# Patient Record
Sex: Female | Born: 1965 | Race: White | Hispanic: No | Marital: Single | Smoking: Never smoker
Health system: Southern US, Community
[De-identification: ages and names within clinical notes are randomized; demographics above are authoritative.]

## PROBLEM LIST (undated history)

## (undated) DIAGNOSIS — Z808 Family history of malignant neoplasm of other organs or systems: Secondary | ICD-10-CM

## (undated) DIAGNOSIS — K219 Gastro-esophageal reflux disease without esophagitis: Secondary | ICD-10-CM

## (undated) DIAGNOSIS — I10 Essential (primary) hypertension: Secondary | ICD-10-CM

## (undated) DIAGNOSIS — R0600 Dyspnea, unspecified: Secondary | ICD-10-CM

## (undated) DIAGNOSIS — Z803 Family history of malignant neoplasm of breast: Secondary | ICD-10-CM

## (undated) DIAGNOSIS — G4730 Sleep apnea, unspecified: Secondary | ICD-10-CM

## (undated) DIAGNOSIS — R519 Headache, unspecified: Secondary | ICD-10-CM

## (undated) DIAGNOSIS — C801 Malignant (primary) neoplasm, unspecified: Secondary | ICD-10-CM

## (undated) DIAGNOSIS — S82852A Displaced trimalleolar fracture of left lower leg, initial encounter for closed fracture: Secondary | ICD-10-CM

## (undated) DIAGNOSIS — F419 Anxiety disorder, unspecified: Secondary | ICD-10-CM

## (undated) DIAGNOSIS — Z800 Family history of malignant neoplasm of digestive organs: Secondary | ICD-10-CM

## (undated) DIAGNOSIS — Z923 Personal history of irradiation: Secondary | ICD-10-CM

## (undated) HISTORY — DX: Family history of malignant neoplasm of digestive organs: Z80.0

## (undated) HISTORY — PX: BREAST BIOPSY: SHX20

## (undated) HISTORY — DX: Essential (primary) hypertension: I10

## (undated) HISTORY — DX: Family history of malignant neoplasm of other organs or systems: Z80.8

## (undated) HISTORY — DX: Family history of malignant neoplasm of breast: Z80.3

## (undated) NOTE — *Deleted (*Deleted)
[facility]  [phone] [phone]     ID: Sherri Schultz DOB: July 29, 1966  MR#: [id number]  ONG#:[id number]  Patient Care Team: Elizabeth Palau, FNP as PCP - General (Nurse Practitioner) Donnelly Angelica, RN as Oncology Nurse Navigator Pershing Proud, RN as Oncology Nurse Navigator Almond Lint, MD as Consulting Physician (General Surgery) Magrinat, Valentino Hue, MD as Consulting Physician (Oncology) Dorothy Puffer, MD as Consulting Physician (Radiation Oncology) Tarry Kos, MD as Attending Physician (Orthopedic Surgery) Kari Baars OTHER MD:  I connected with Sherri Schultz on 09/10/20 at  2:30 PM EDT by {Blank single:19197::"video enabled telemedicine visit","telephone visit"} and verified that I am speaking with the correct person using two identifiers.   I discussed the limitations, risks, security and privacy concerns of performing an evaluation and management service by telemedicine and the availability of in-person appointments. I also discussed with the patient that there may be a patient responsible charge related to this service. The patient expressed understanding and agreed to proceed.   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  She completed radiation therapy on 05/04/2020 under Dr. Mitzi Hansen.  She started tamoxifen on 05/29/2020.  Since her last visit, she reported left breast tenderness. She underwent left diagnostic mammography and left breast ultrasonography on 09/07/2020 showing: breast density category B; no evidence of sonographic abnormalities to account for tenderness in left breast; no evidence of malignancy.    REVIEW OF SYSTEMS: Sherri Schultz    HISTORY OF CURRENT ILLNESS: From the original intake note:  Sherri Schultz had routine screening mammography on 12/01/2019 showing a possible abnormalities in the left breast. She underwent left diagnostic mammography with tomography and left breast ultrasonography at [facility] on 12/08/2019 showing: breast density category C; suspicious 1.5 cm mass involving the lower-inner quadrant of the left breast at 8:30, with a hyperechoic halo surrounding the mass for a total span of 1.8 cm; indeterminate 0.4 cm mass in the upper-inner quadrant of the left breast at 10 o'clock; no pathologic left axillary lymphadenopathy; benign cyst in the upper-outer subareolar left breast.  Accordingly on 12/14/2019 she proceeded to biopsy of the left breast areas in question. The pathology from this procedure ([id number]) showed:  1. Left breast, 8:30   - ductal carcinoma in situ, grade 3, with apocrine features (0.6 cm on biopsy)  - estrogen receptor, 0% negative and progesterone receptor, 0% negative.  2. Left breast, 10 o'clock  - fibroadenomatoid nodule with columnar cells changes  The patient's subsequent history is as detailed below.   PAST MEDICAL HISTORY: Past Medical History:  Diagnosis Date  . Anxiety   . 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 Cancer [facility]) 2013   right knee  . Family history of brain cancer   . Family history of breast cancer   . Family history of colon cancer   . Family history of melanoma   . Hypertension   . Trimalleolar fracture of ankle, closed, left, initial encounter     PAST SURGICAL HISTORY: Past Surgical History:  Procedure Laterality Date  . BREAST BIOPSY Right 10+ yrs ago   benign  . BREAST LUMPECTOMY WITH RADIOACTIVE SEED LOCALIZATION Left 02/15/2020  Procedure: LEFT BREAST LUMPECTOMY WITH RADIOACTIVE SEED LOCALIZATION;  Surgeon: Almond Lint, MD;  Location: [facility];  Service: General;  Laterality:  Left;  Marland Kitchen MELANOMA EXCISION Right 2013   knee  . ORIF ANKLE FRACTURE Left 05/06/2018   Procedure: OPEN REDUCTION INTERNAL FIXATION (ORIF) LEFT TRIMALLEOLAR ANKLE FRACTURE;  Surgeon: Tarry Kos, MD;  Location: [facility];  Service: Orthopedics;  Laterality: Left;  . RADIOACTIVE SEED GUIDED EXCISIONAL BREAST BIOPSY Right 02/15/2020   Procedure: RIGHT BREAST RADIOACTIVE SEED LOCALIZATION EXCISIONAL BIOPSY X 2;  Surgeon: Almond Lint, MD;  Location: [facility];  Service: General;  Laterality: Right;    FAMILY HISTORY: Family History  Problem Relation Age of Onset  . Breast cancer Mother 5  . Diabetes Mother   . Colon cancer Mother 60  . Breast cancer Maternal Aunt        dx. >50  . Diabetes Father   . Hypertension Father   . Heart attack Father   . Arthritis Father   . Diabetes Brother   . Melanoma Brother 93  . Diabetes Brother   . Breast cancer Cousin        dx. 40s/50s  . Brain cancer Maternal Uncle        dx. 20s  . Breast cancer Maternal Aunt        dx. >50  . Breast cancer Maternal Aunt        dx. >50  . Cancer Maternal Uncle        unknown type, dx. >50  . Breast cancer Cousin        dx. 40s/50s   The patient's father died at age 57 from congestive heart failure.  Patient's mother died from breast cancer at age 19, diagnosed age 71.. The patient denies a family hx of ovarian cancer. She reports breast cancer in 2 maternal aunts and 2 maternal first cousins.  The patient has 2 brothers with no history of cancer.   GYNECOLOGIC HISTORY:  Patient's last menstrual period was 03/26/2016. Menarche: 32 years old GX P 0 LMP 2017 HRT no  Hysterectomy? no BSO? 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  They mostly have trees in their [place] acres.  Her husband Sherri Schultz (the patient kept her name on her insurance) used to work as a Copywriter, advertising for [employer] but is now  also retired.  They have no children and no pets.  The patient attends a local [employer]   ADVANCED DIRECTIVES: In the absence of any documents to the contrary the patient's husband is her healthcare power of attorney   HEALTH MAINTENANCE: Social History   Tobacco Use  . Smoking status: Never Smoker  . Smokeless tobacco: Never Used  Substance Use Topics  . Alcohol use: Never  . Drug use: Never     Colonoscopy: Never  PAP: 04/2016, negative  Bone density: Never   No Known Allergies  Current Outpatient Medications  Medication Sig Dispense Refill  . calcium-vitamin D (OSCAL WITH D) 500-200 MG-UNIT tablet Take 1 tablet by mouth 3 (three) times daily. 90 tablet 12  . estradiol (ESTRACE VAGINAL) 0.1 MG/GM vaginal cream Place 1 Applicatorful vaginally at bedtime. 42.5 g 12  . meclizine (ANTIVERT) 25 MG tablet Take 1 tablet by mouth 3 (three) times daily as needed.    . propranolol (INDERAL) 40 MG tablet Take 40 mg by  mouth daily.    . valACYclovir (VALTREX) 1000 MG tablet Take 2 tablets by mouth 2 (two) times daily as needed.    . venlafaxine XR (EFFEXOR-XR) 150 MG 24 hr capsule Take 1 capsule by mouth daily.  0  . zinc sulfate 220 (50 Zn) MG capsule Take 1 capsule (220 mg total) by mouth daily. 42 capsule 0   No current facility-administered medications for this visit.    OBJECTIVE:   There were no vitals filed for this visit.   There is no height or weight on file to calculate BMI.   Wt Readings from Last 3 Encounters:  03/22/20 163 lb 14.4 oz (74.3 kg)  02/15/20 162 lb 11.2 oz (73.8 kg)  12/22/19 159 lb 14.4 oz (72.5 kg)      ECOG FS:  Telemedicine visit 09/11/2020   LAB RESULTS:  CMP     Component Value Date/Time   NA 142 12/22/2019 0827   K 3.7 12/22/2019 0827   CL 110 12/22/2019 0827   CO2 24 12/22/2019 0827   GLUCOSE 101 (H) 12/22/2019 0827   BUN 7 12/22/2019 0827   CREATININE 0.90 12/22/2019 0827   CALCIUM 8.7 (L) 12/22/2019 0827   PROT 7.1  12/22/2019 0827   ALBUMIN 3.9 12/22/2019 0827   AST 18 12/22/2019 0827   ALT 16 12/22/2019 0827   ALKPHOS 100 12/22/2019 0827   BILITOT 0.3 12/22/2019 0827   GFRNONAA >60 12/22/2019 0827   GFRAA >60 12/22/2019 0827    No results found for: TOTALPROTELP, ALBUMINELP, A1GS, A2GS, BETS, BETA2SER, GAMS, MSPIKE, SPEI  Lab Results  Component Value Date   WBC 4.5 12/22/2019   NEUTROABS 2.1 12/22/2019   HGB 12.4 12/22/2019   HCT 37.6 12/22/2019   MCV 93.1 12/22/2019   PLT 298 12/22/2019    No results found for: LABCA2  No components found for: WUJWJX914  No results for input(s): INR in the last 168 hours.  No results found for: LABCA2  No results found for: NWG956  No results found for: OZH086  No results found for: VHQ469  No results found for: CA2729  No components found for: HGQUANT  No results found for: CEA1 / No results found for: CEA1   No results found for: AFPTUMOR  No results found for: CHROMOGRNA  No results found for: KPAFRELGTCHN, LAMBDASER, KAPLAMBRATIO (kappa/lambda light chains)  No results found for: HGBA, HGBA2QUANT, HGBFQUANT, HGBSQUAN (Hemoglobinopathy evaluation)   No results found for: LDH  No results found for: IRON, TIBC, IRONPCTSAT (Iron and TIBC)  No results found for: FERRITIN  Urinalysis No results found for: COLORURINE, APPEARANCEUR, LABSPEC, PHURINE, GLUCOSEU, HGBUR, BILIRUBINUR, KETONESUR, PROTEINUR, UROBILINOGEN, NITRITE, LEUKOCYTESUR   STUDIES: US BREAST LTD UNI LEFT INC AXILLA  Result Date: 09/07/2020 CLINICAL DATA:  35 year old female with left breast DCIS post lumpectomy 02/15/2020 followed by radiation therapy. Patient reports tenderness involving the upper-outer quadrant of the left breast. EXAM: DIGITAL DIAGNOSTIC UNILATERAL LEFT MAMMOGRAM WITH TOMO AND CAD; ULTRASOUND LEFT BREAST LIMITED COMPARISON:  Previous exams. ACR Breast Density Category b: There are scattered areas of fibroglandular density. FINDINGS: No  suspicious masses or calcifications are seen in the left breast. New lumpectomy changes are present in the lower inner left breast. There is no mammographic evidence of malignancy in the left breast. Mammographic images were processed with CAD. Physical examination of the upper-outer left breast reveals tenderness with deep palpation at the approximate 2 to 4 o'clock position. Targeted ultrasound of the left breast was performed. No suspicious masses  or abnormality seen, only heterogeneous fibroglandular tissue identified. IMPRESSION: 1. No mammographic or sonographic abnormalities to account for tenderness involving the upper-outer quadrant of the left breast. 2. New lumpectomy changes in the left breast. No mammographic evidence of malignancy. RECOMMENDATION: Bilateral diagnostic mammography January 2022. I have discussed the findings and recommendations with the patient. If applicable, a reminder letter will be sent to the patient regarding the next appointment. BI-RADS CATEGORY  2: Benign. Electronically Signed   By: Edwin Cap M.D.   On: 09/07/2020 16:33   MM DIAG BREAST TOMO UNI LEFT  Result Date: 09/07/2020 CLINICAL DATA:  27 year old female with left breast DCIS post lumpectomy 02/15/2020 followed by radiation therapy. Patient reports tenderness involving the upper-outer quadrant of the left breast. EXAM: DIGITAL DIAGNOSTIC UNILATERAL LEFT MAMMOGRAM WITH TOMO AND CAD; ULTRASOUND LEFT BREAST LIMITED COMPARISON:  Previous exams. ACR Breast Density Category b: There are scattered areas of fibroglandular density. FINDINGS: No suspicious masses or calcifications are seen in the left breast. New lumpectomy changes are present in the lower inner left breast. There is no mammographic evidence of malignancy in the left breast. Mammographic images were processed with CAD. Physical examination of the upper-outer left breast reveals tenderness with deep palpation at the approximate 2 to 4 o'clock position.  Targeted ultrasound of the left breast was performed. No suspicious masses or abnormality seen, only heterogeneous fibroglandular tissue identified. IMPRESSION: 1. No mammographic or sonographic abnormalities to account for tenderness involving the upper-outer quadrant of the left breast. 2. New lumpectomy changes in the left breast. No mammographic evidence of malignancy. RECOMMENDATION: Bilateral diagnostic mammography January 2022. I have discussed the findings and recommendations with the patient. If applicable, a reminder letter will be sent to the patient regarding the next appointment. BI-RADS CATEGORY  2: Benign. Electronically Signed   By: Edwin Cap M.D.   On: 09/07/2020 16:33     ELIGIBLE FOR AVAILABLE RESEARCH PROTOCOL: No  ASSESSMENT: 47 y.o. Sherri Schultz woman status post left breast biopsy 12/14/2019 for ductal carcinoma in situ, clinically 1.8 cm, grade 3, estrogen and progesterone receptor negative  (1) genetics testing 01/01/2020 through the STAT Breast cancer panel offered by [facility] found no deleterious mutations in  ATM, BRCA1, BRCA2, CDH1, CHEK2, PALB2, PTEN, STK11 and TP53.  The Multi-Cancer Panel offered by [facility] includes also found no deleterious mutations inAIP, ALK, APC, ATM, AXIN2,BAP1,  BARD1, BLM, BMPR1A, BRCA1, BRCA2, BRIP1, CASR, CDC73, CDH1, CDK4, CDKN1B, CDKN1C, CDKN2A (p14ARF), CDKN2A (p16INK4a), CEBPA, CHEK2, CTNNA1, DICER1, DIS3L2, EGFR (c.2369C>T, p.Thr790Met variant only), EPCAM (Deletion/duplication testing only), FH, FLCN, GATA2, GPC3, GREM1 (Promoter region deletion/duplication testing only), HOXB13 (c.251G>A, p.Gly84Glu), HRAS, KIT, MAX, MEN1, MET, MITF (c.952G>A, p.Glu318Lys variant only), MLH1, MSH2, MSH3, MSH6, MUTYH, NBN, NF1, NF2, NTHL1, PALB2, PDGFRA, PHOX2B, PMS2, POLD1, POLE, POT1, PRKAR1A, PTCH1, PTEN, RAD50, RAD51C, RAD51D, RB1, RECQL4, RET, RNF43, RUNX1, SDHAF2, SDHA (sequence changes only), SDHB, SDHC, SDHD, SMAD4, SMARCA4, SMARCB1, SMARCE1,  STK11, SUFU, TERC, TERT, TMEM127, TP53, TSC1, TSC2, VHL, WRN and WT1.     (a) a variant of  uncertain significance was detected in the MSH3 gene called c.230_232dup (p.Pro77dup).   (2) status post bilateral lumpectomies 02/15/2020, showing  (a) on the right, no evidence of malignancy (radial scar, fibroadenoma).  (b) on the left, ductal carcinoma in situ, grade 3, measuring 1.4 cm, with negative margins  (3) adjuvant radiation 03/20/2020 - 05/04/2020  (a) left breast / 50.4 Gy in 28 fractions  (b) seroma boost / 10 Gy in 5 fractions  (4) started  tamoxifen 05/29/2020   PLAN: Televisit 08/11/2020 has been rescheduled for 09/11/2020   Kari Baars   09/10/2020 12:21 PM Medical Oncology and Hematology [facility] [address] [place], [place] [address] Tel. [phone]    Fax. [phone]   This document serves as a record of services personally performed by Ruthann Cancer, MD. It was created on his behalf by Mickie Bail, a trained medical scribe. The creation of this record is based on the scribe's personal observations and the provider's statements to them.   I, Ruthann Cancer MD, have reviewed the above documentation for accuracy and completeness, and I agree with the above.   *Total Encounter Time as defined by the Centers for Medicare and Medicaid Services includes, in addition to the face-to-face time of a patient visit (documented in the note above) non-face-to-face time: obtaining and reviewing outside history, ordering and reviewing medications, tests or procedures, care coordination (communications with other health care professionals or caregivers) and documentation in the medical record.

---

## 2001-12-16 ENCOUNTER — Encounter: Admission: RE | Admit: 2001-12-16 | Discharge: 2001-12-16 | Payer: Self-pay | Admitting: Family Medicine

## 2001-12-16 ENCOUNTER — Encounter: Payer: Self-pay | Admitting: Family Medicine

## 2001-12-21 ENCOUNTER — Encounter: Admission: RE | Admit: 2001-12-21 | Discharge: 2001-12-21 | Payer: Self-pay | Admitting: Family Medicine

## 2001-12-21 ENCOUNTER — Encounter: Payer: Self-pay | Admitting: Family Medicine

## 2002-12-23 ENCOUNTER — Encounter: Admission: RE | Admit: 2002-12-23 | Discharge: 2002-12-23 | Payer: Self-pay | Admitting: Family Medicine

## 2002-12-23 ENCOUNTER — Encounter: Payer: Self-pay | Admitting: Family Medicine

## 2004-01-11 ENCOUNTER — Encounter: Admission: RE | Admit: 2004-01-11 | Discharge: 2004-01-11 | Payer: Self-pay | Admitting: Family Medicine

## 2006-07-25 ENCOUNTER — Encounter: Admission: RE | Admit: 2006-07-25 | Discharge: 2006-07-25 | Payer: Self-pay | Admitting: Family Medicine

## 2006-07-25 IMAGING — MG MM SCREEN MAMMOGRAM BILATERAL
4 series · 4 of 4 positions shown · non-contrast
Comparison: none

DG SCREEN MAMMOGRAM BILATERAL
Bilateral CC and MLO view(s) were taken.
Prior study comparison: [DATE], bilateral screening mammogram.

SCREENING MAMMOGRAM:
The breast tissue is extremely dense.  A possible mass is noted in the right breast.  Spot 
compression views and possibly sonography are recommended for further evaluation.  The left breast 
is unremarkable.

[R CC]
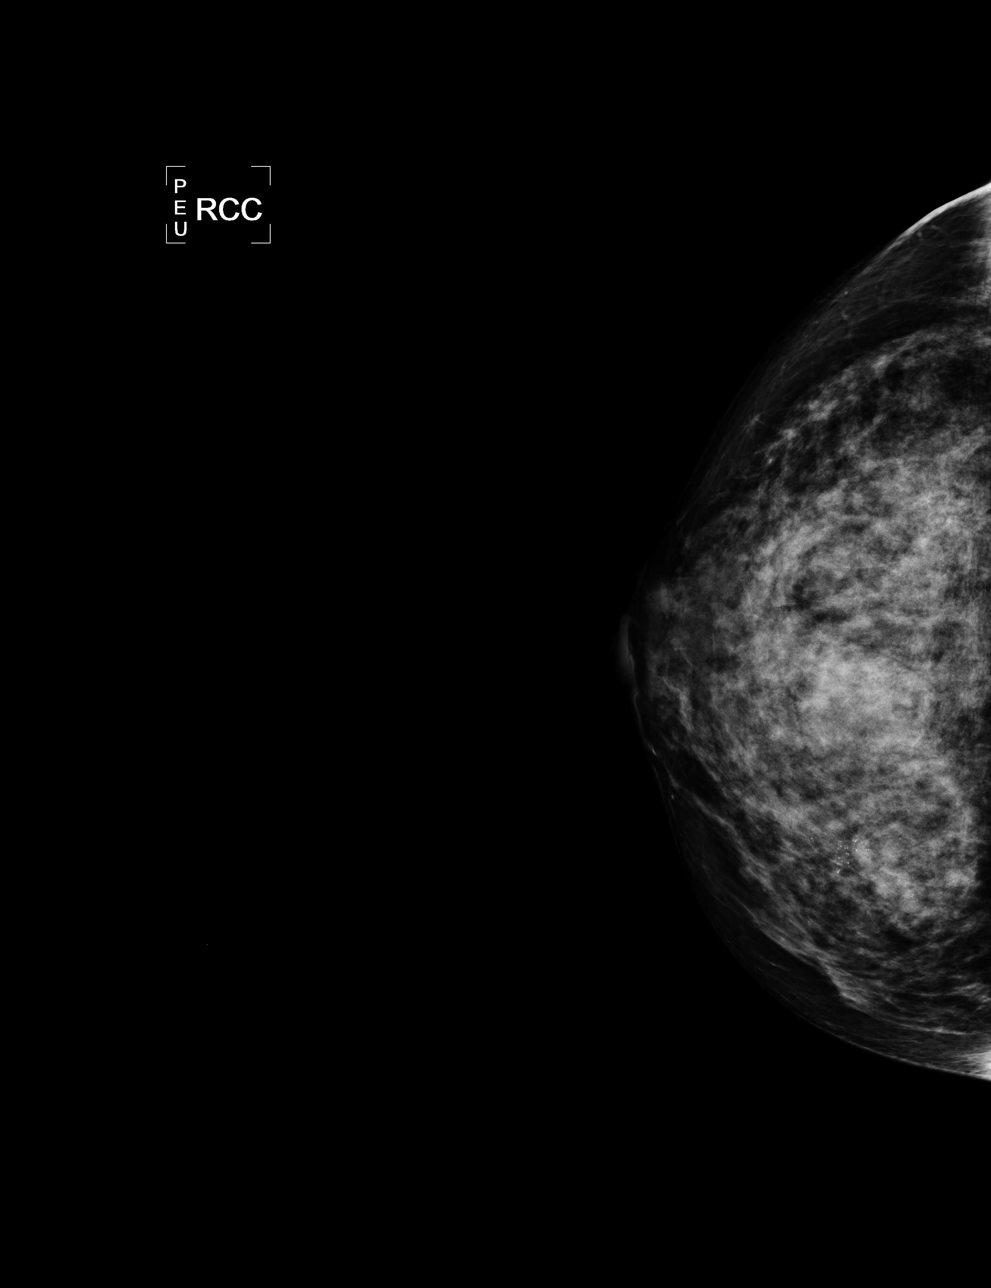

[L CC]
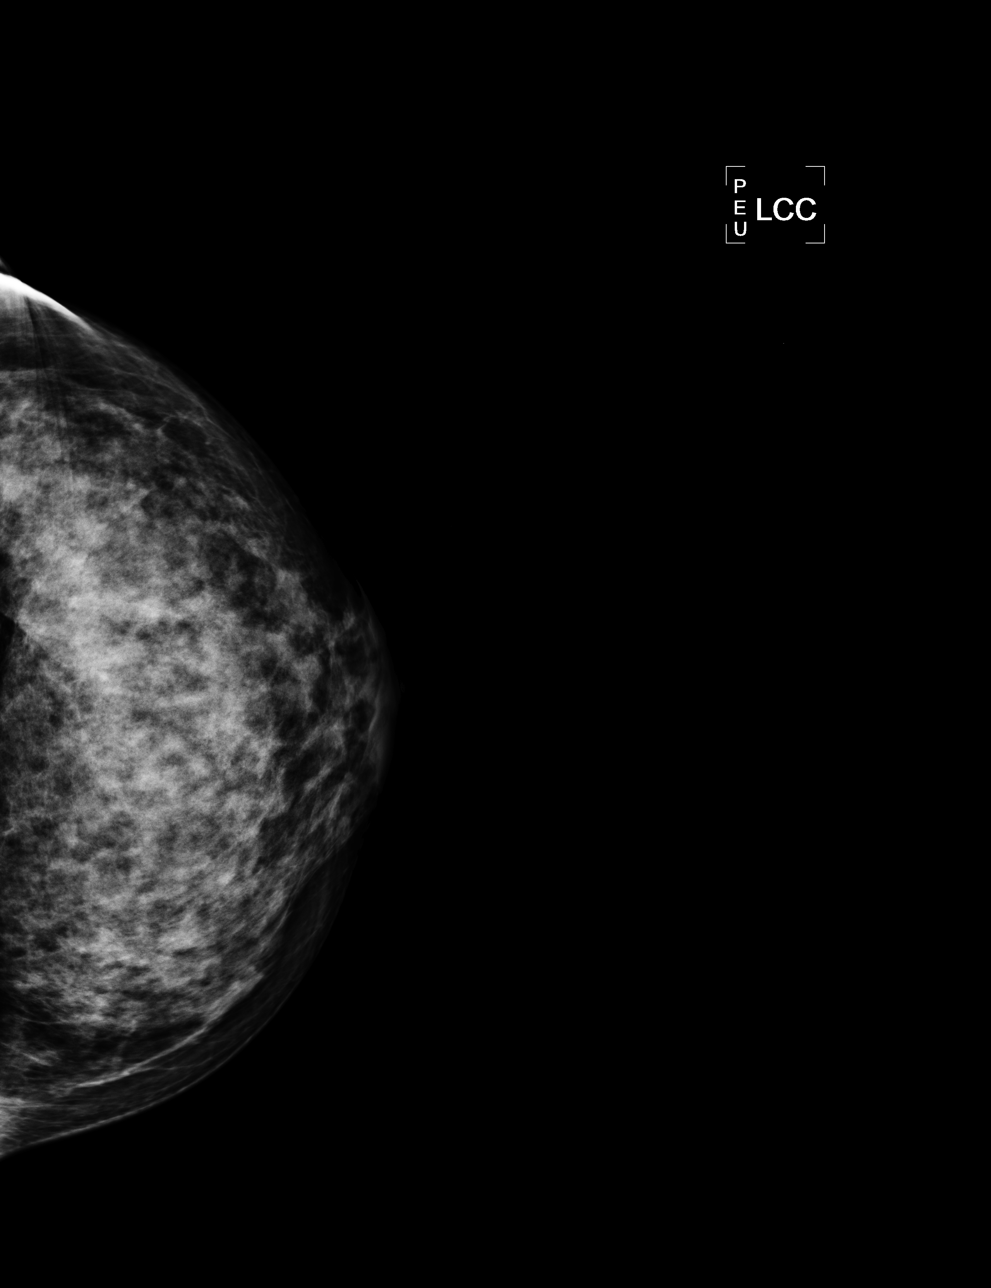

[L MLO]
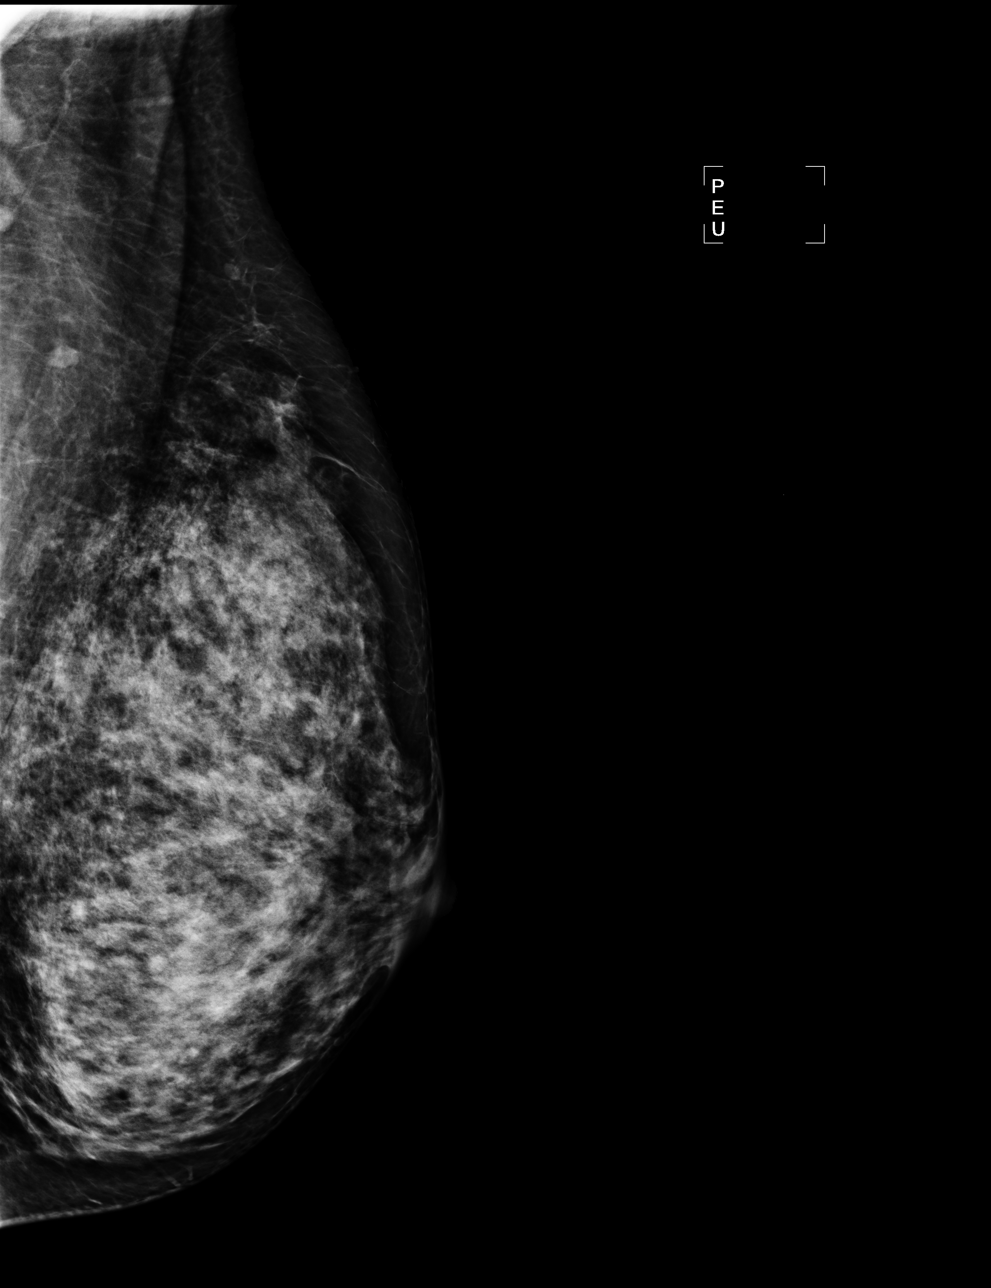

[R MLO]
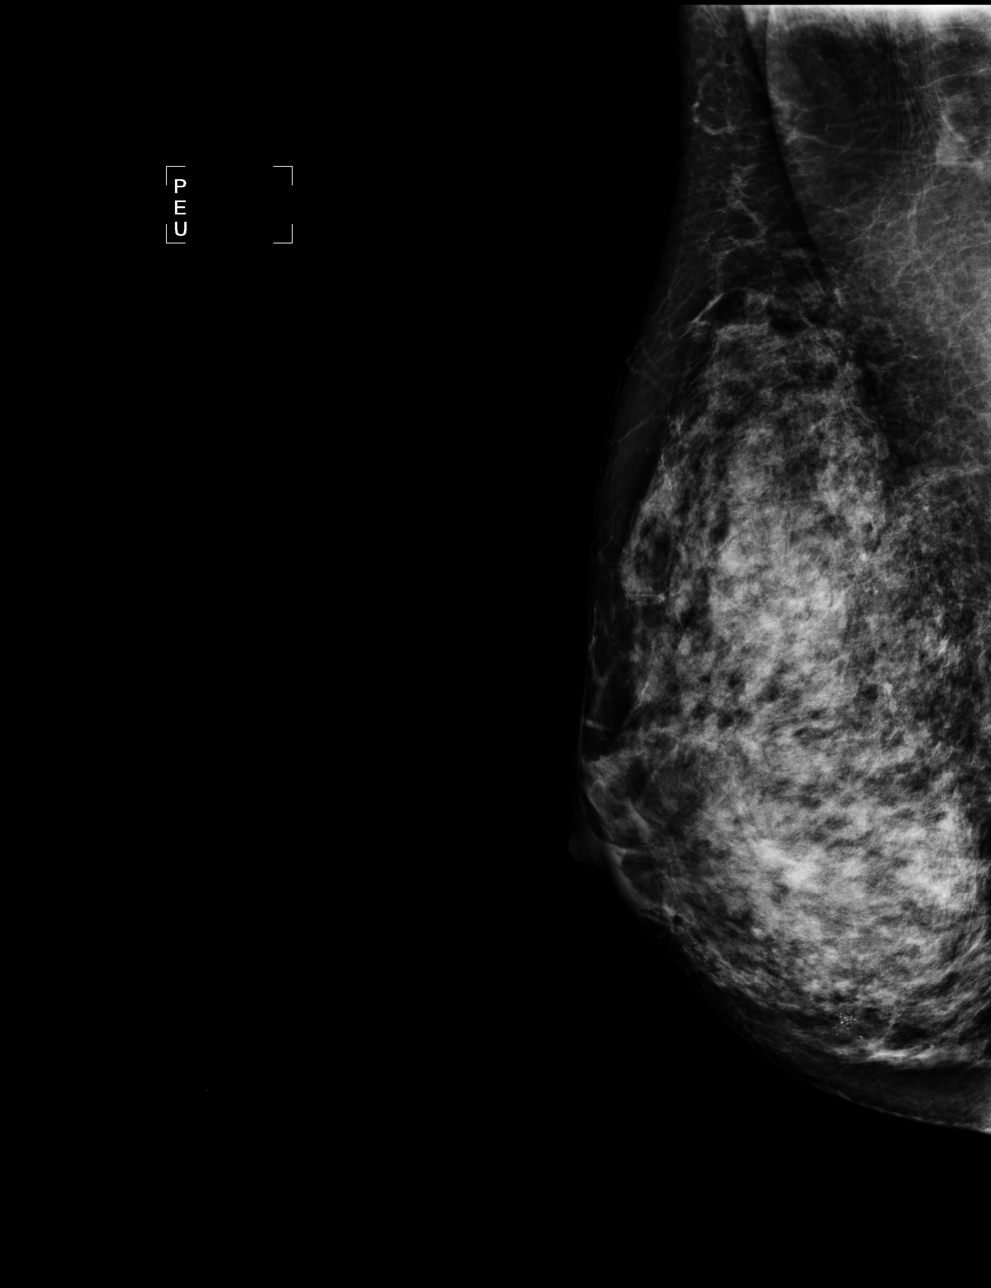

[4 of 4 positions shown; findings below may reference images not displayed]

IMPRESSION: Possible mass, right breast.  Additional evaluation is indicated.  The patient will be contacted 
for additional studies and a supplemental report will follow.

ASSESSMENT: Need additional imaging evaluation and/or prior mammograms for comparison - BI-RADS 0 -
Right

Further imaging of the right breast.
ANALYZED BY COMPUTER AIDED DETECTION. , THIS PROCEDURE WAS A DIGITAL MAMMOGRAM.

## 2006-08-04 ENCOUNTER — Encounter: Admission: RE | Admit: 2006-08-04 | Discharge: 2006-08-04 | Payer: Self-pay | Admitting: Family Medicine

## 2006-08-04 IMAGING — MG MM DIAGNOSTIC LTD RIGHT
2 series · 2 of 2 positions shown · non-contrast
Comparison: none

[REDACTED] RIGHT
CC and MLO view(s) were taken of the right breast.

RIGHT BREAST ULTRASOUND
[REDACTED] MAMMOGRAM AND RIGHT BREAST ULTRASOUND:
CLINICAL DATA: Abnormal screening study.

[R CC]
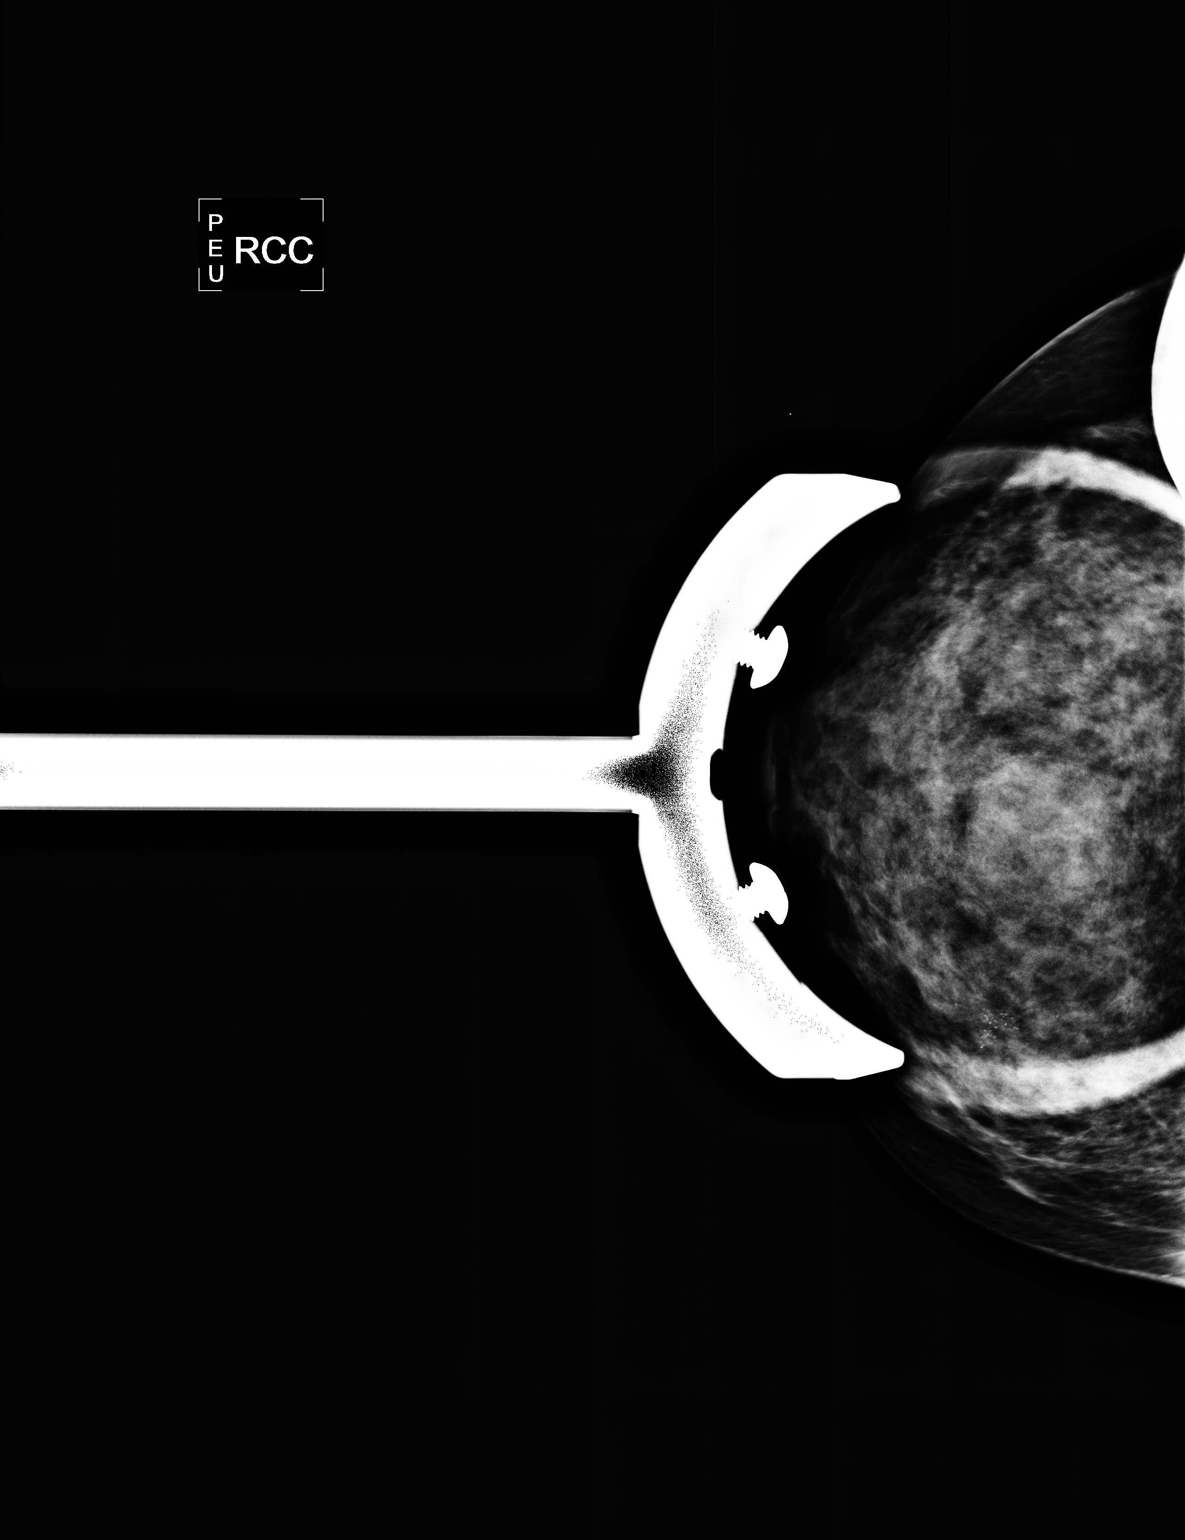

[R MLO]
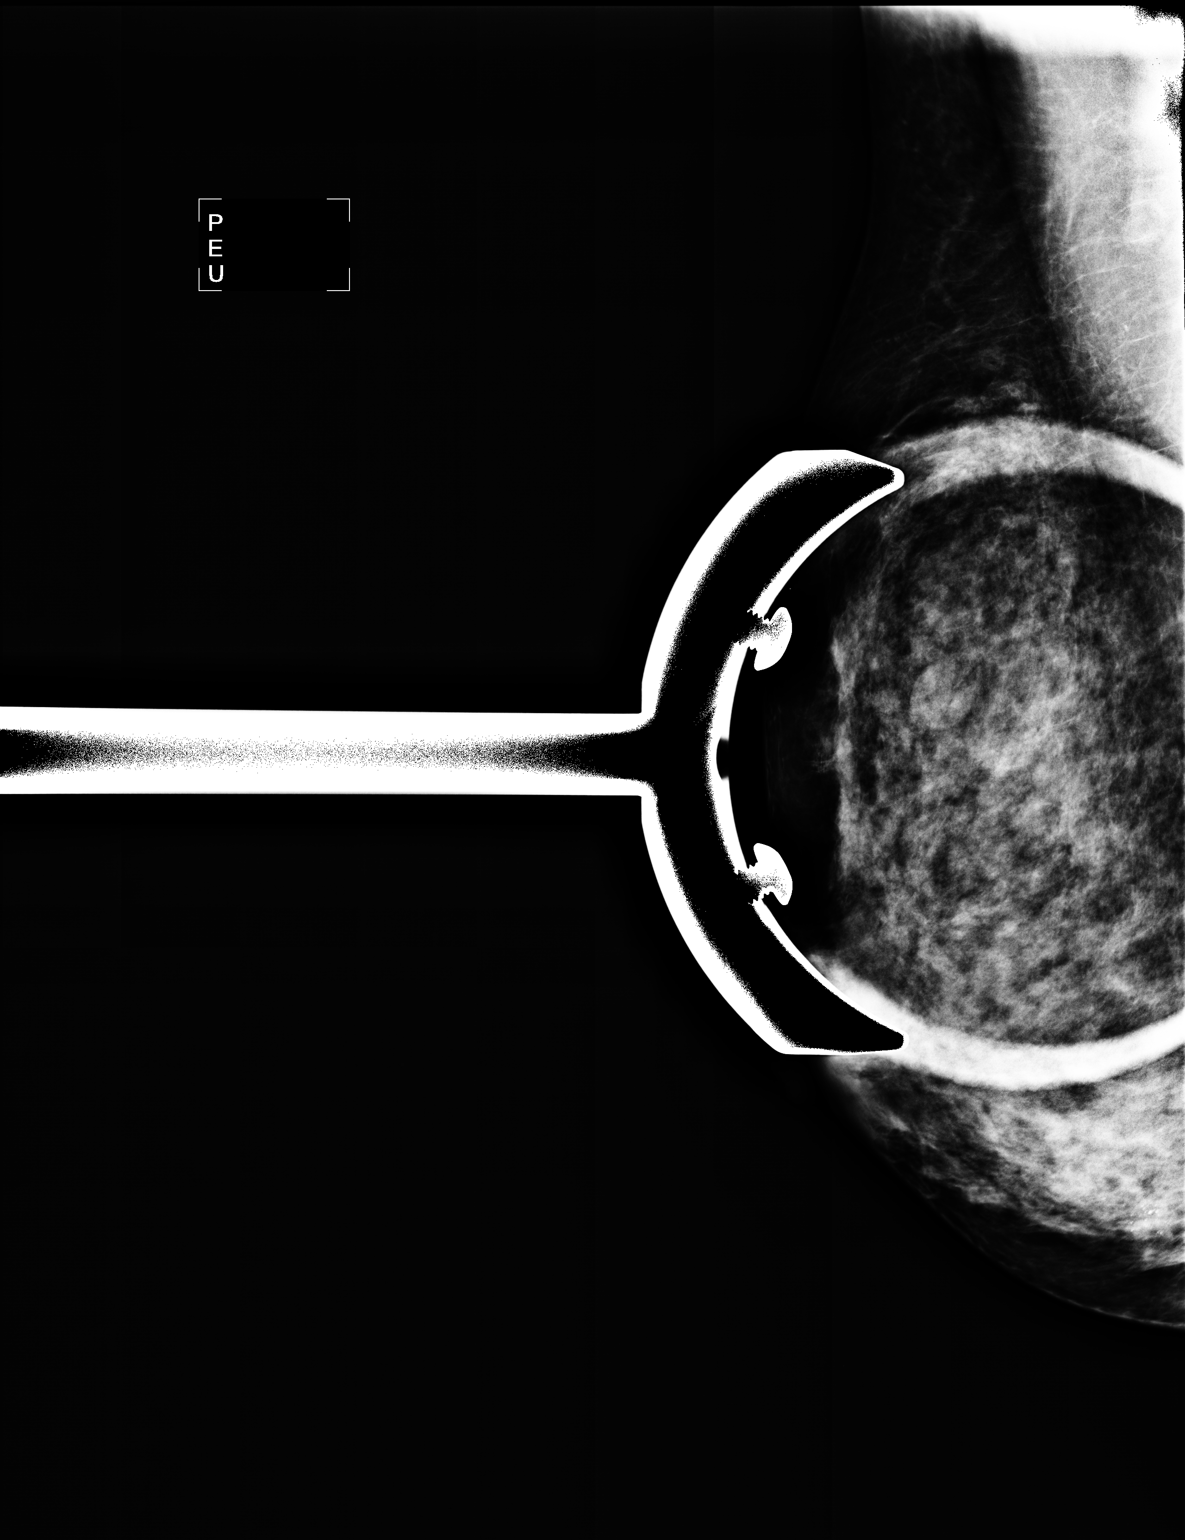

[2 of 2 positions shown; findings below may reference images not displayed]

Spot compression views at the 12 o'clock position in the right breast is performed.  There is 
persistence of an obscured mass.  Comparison study is a recent screening mammogram dated [DATE] 
and a prior study dated [DATE].

On physical exam, I do not palpate a mass in the right breast.  Sonographically a simple cyst is 
imaged at 12 o'clock approximately 3 cm from the nipple measuring 2.1 x 1.0 x 2.8 cm.
IMPRESSION: Right breast cyst.  No evidence of malignancy.  Screening mammogram in one year is recommended.

ASSESSMENT: Benign - BI-RADS 2

Screening mammogram in 1 year.
,

## 2007-08-27 ENCOUNTER — Encounter: Admission: RE | Admit: 2007-08-27 | Discharge: 2007-08-27 | Payer: Self-pay | Admitting: Family Medicine

## 2007-08-27 IMAGING — US UNKNOWN US STUDY
1 series · 4 of 4 positions shown · non-contrast
Comparison: none

DG DIAGNOSTIC BILATERAL
Bilateral CC and MLO view(s) were taken.

BILATERAL BREAST ULTRASOUND
DIGITAL BILATERAL DIAGNOSTIC MAMMOGRAM WITH CAD AND BILATERAL BREAST ULTRASOUND:
CLINICAL DATA: Palpable lump in the right upper outer quadrant.

[Series 1: unknown us study · 4 of 4 slices shown]
[im 1/4]
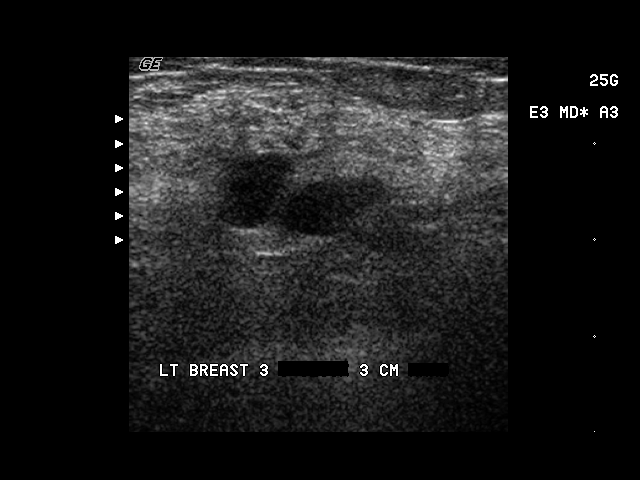
[im 2/4]
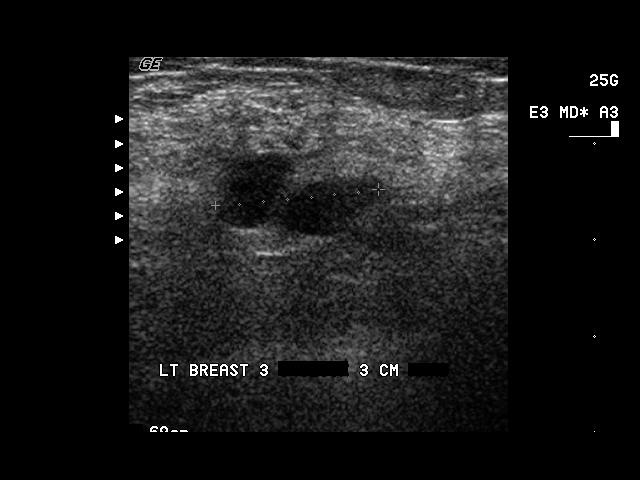
[im 3/4]
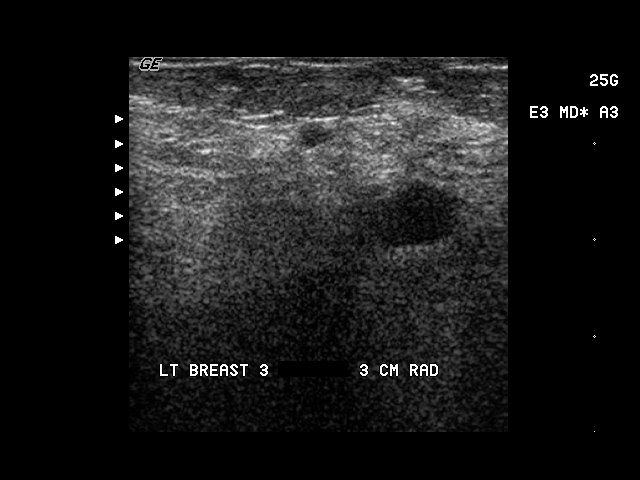
[im 4/4]
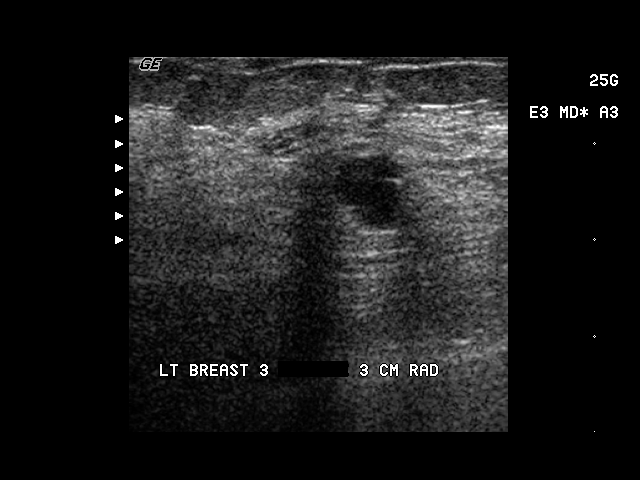

[4 of 4 positions shown; findings below may reference images not displayed]

Comparison studies are dated [DATE] and [DATE].  The dense fibroglandular parenchymal pattern is 
symmetric and stable.  The previously seen simple cyst in the inner right breast is unchanged.  Two
small partially obscured isodense masses are seen within the deep upper outer left breast today.

On physical exam today, I cannot palpate a discrete lump within the right breast.  Ultrasound of 
the right upper outer quadrant in the patient's region of palpable concern reveals normal, but 
dense fibroglandular tissue only.

Ultrasound of the deep outer left breast reveals two adjacent simple cysts at the 3 o'clock 
position, 3 cm from the nipple, each of which measure approximately 7 mm in dimension.  No 
suspicious solid masses or shadowing are seen bilaterally.
IMPRESSION: There is no specific radiographic evidence of malignancy bilaterally.  Simple cysts are seen in the
left breast.  Dense, but normal-appearing fibroglandular tissue is seen in the right upper outer 
quadrant in the patient's region of palpable concern.  Bilateral screening mammogram in one year is
recommended, unless otherwise clinically indicated.

ASSESSMENT: Benign - BI-RADS 2

Routine screening mammogram in 1 year.
ANALYZED BY COMPUTER AIDED DETECTION. ,

## 2008-09-08 ENCOUNTER — Encounter: Admission: RE | Admit: 2008-09-08 | Discharge: 2008-09-08 | Payer: Self-pay | Admitting: Family Medicine

## 2008-09-08 IMAGING — MG MM SCREEN MAMMOGRAM BILATERAL
4 series · 4 of 4 positions shown · non-contrast
Comparison: Prior studies.

DG SCREEN MAMMOGRAM BILATERAL
Bilateral CC and MLO view(s) were taken.
Technologist: ALMANZAR

DIGITAL SCREENING MAMMOGRAM WITH CAD:

[R CC]
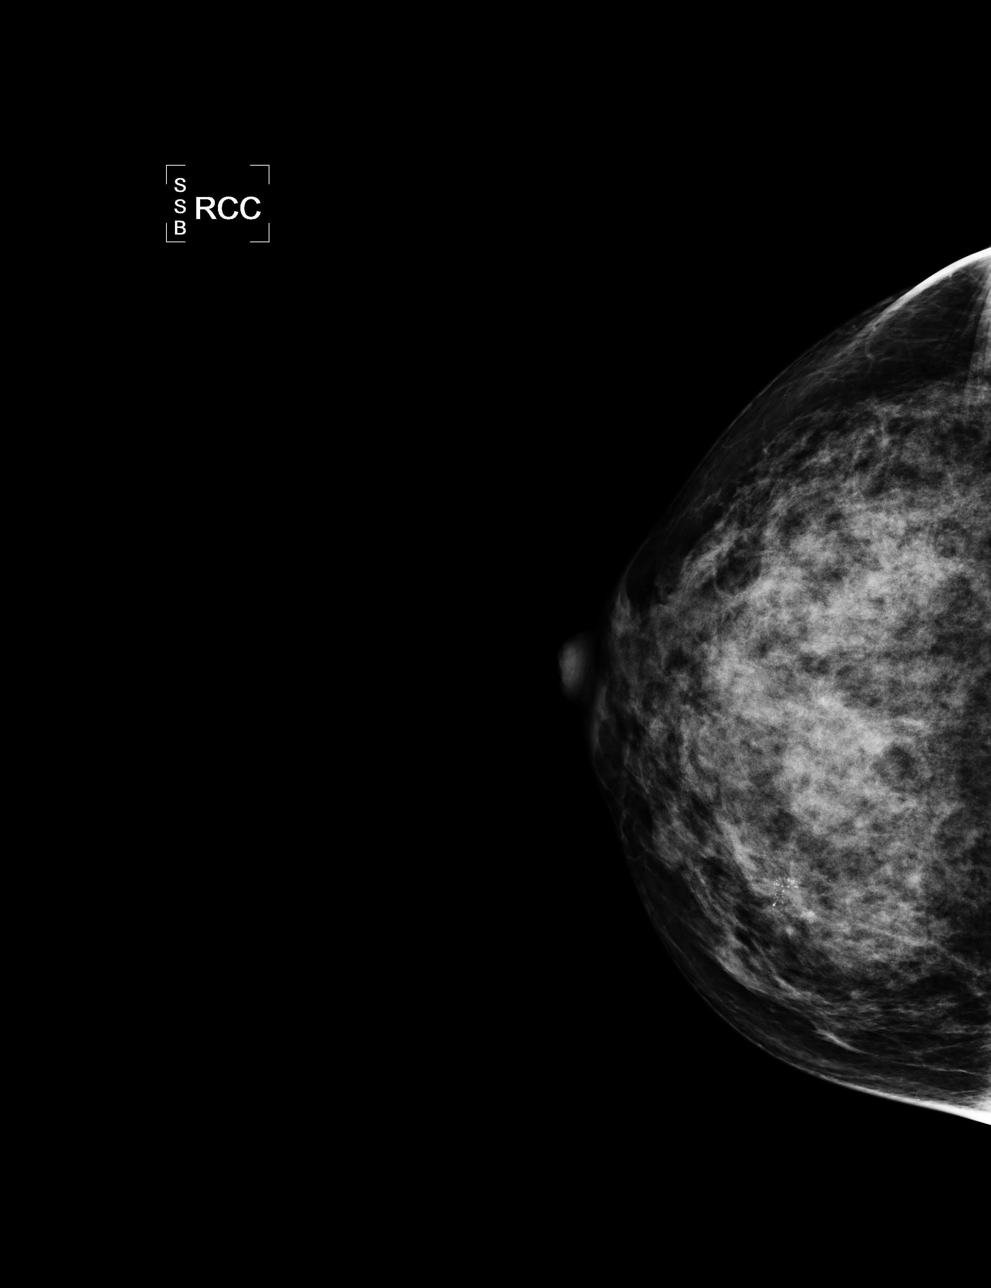

[L CC]
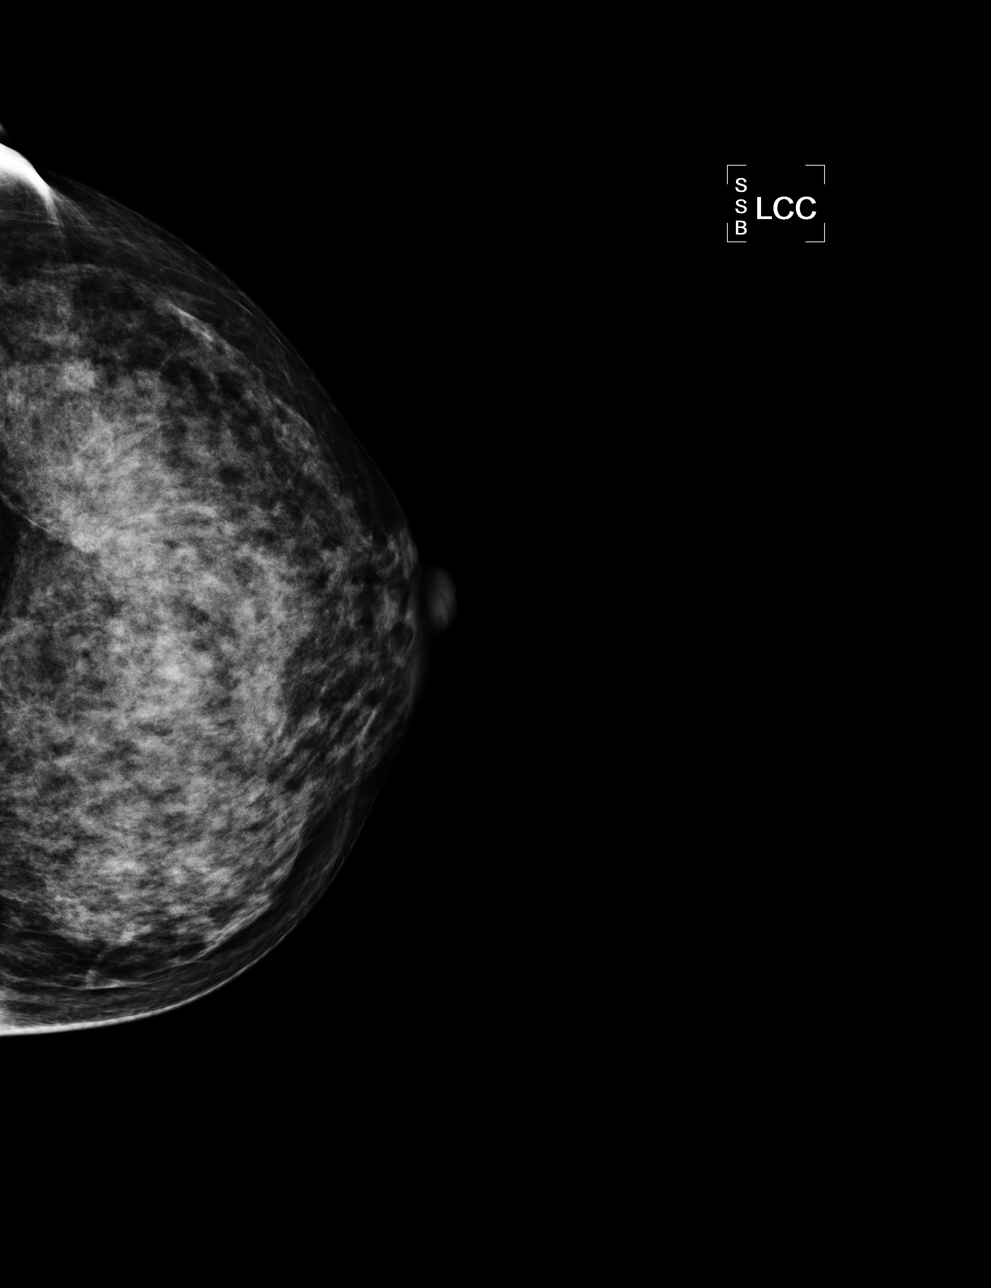

[L MLO]
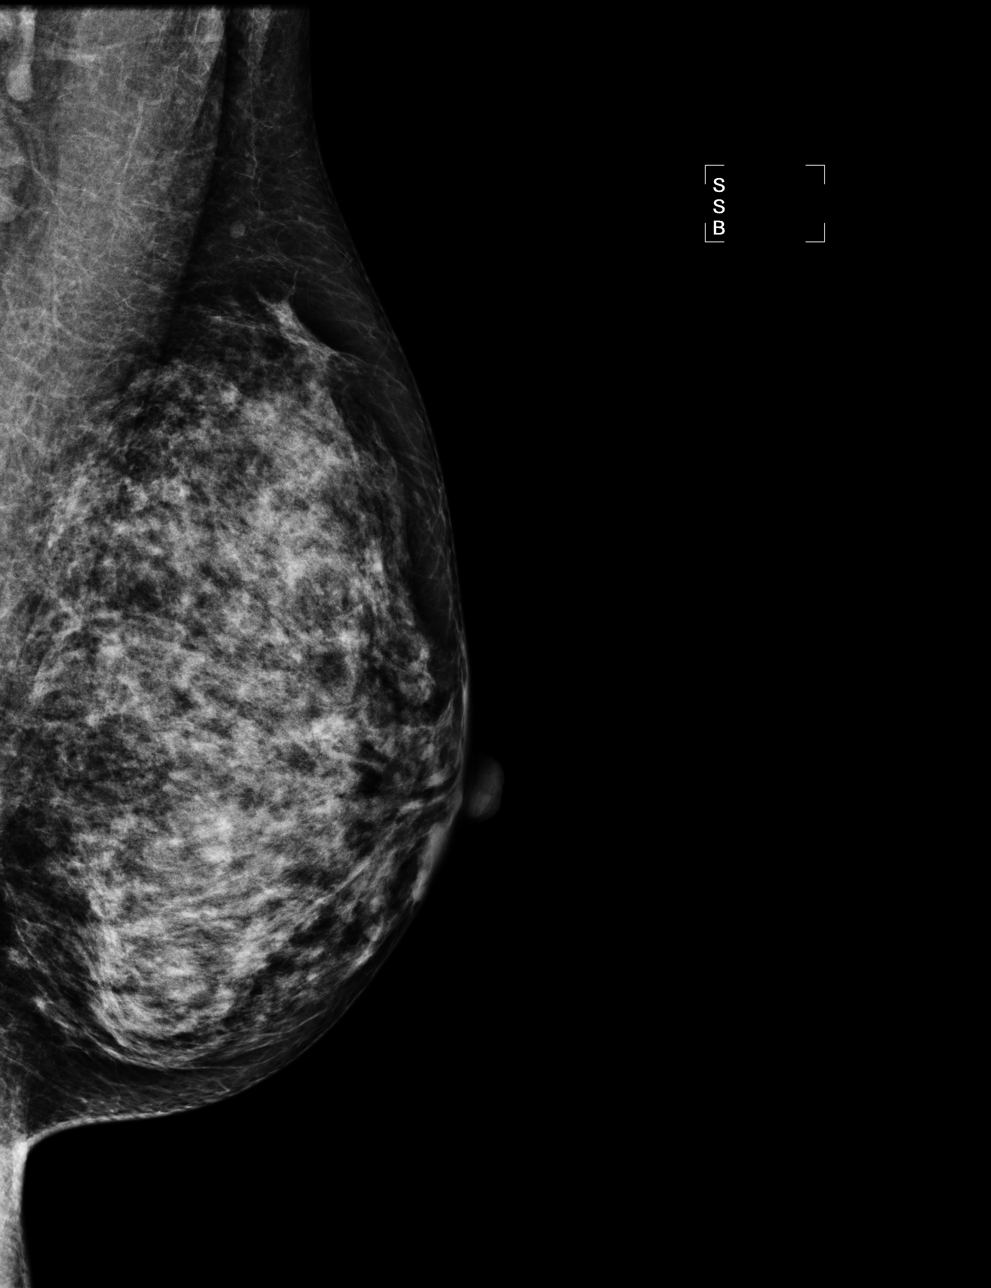

[R MLO]
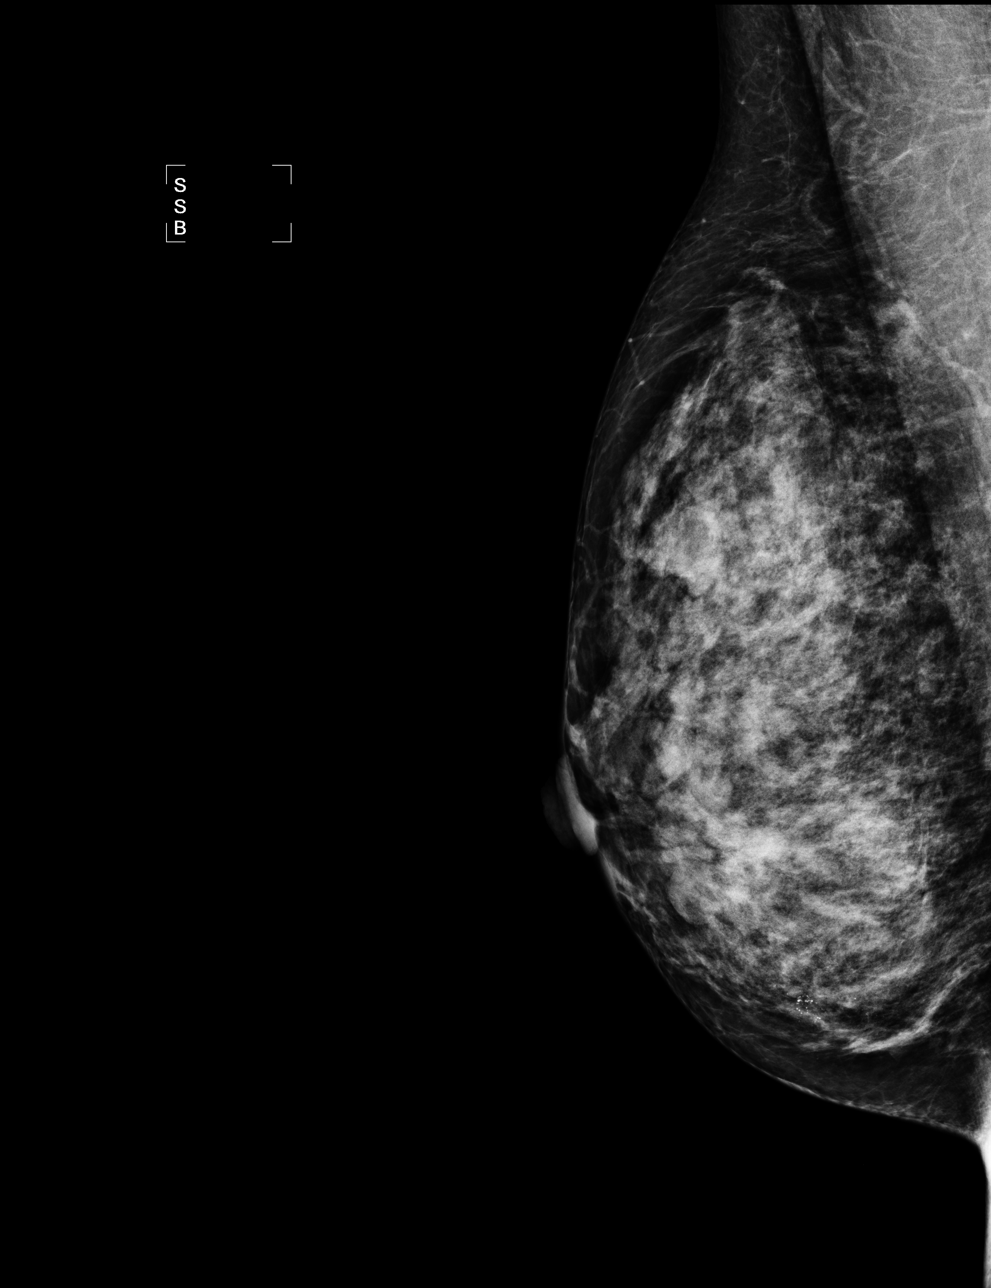

[4 of 4 positions shown; findings below may reference images not displayed]

The breast tissue is extremely dense.  A possible mass is noted in the right breast.  Spot 
compression views and possibly sonography are recommended for further evaluation.  The left breast 
is unremarkable.
IMPRESSION: Possible mass, right breast.  Additional evaluation is indicated.  The patient will be contacted 
for additional studies and a supplemental report will follow.

ASSESSMENT: Need additional imaging evaluation and/or prior mammograms for comparison - BI-RADS 0 -
Right

Further imaging of the right breast.
ANALYZED BY COMPUTER AIDED DETECTION. , THIS PROCEDURE WAS A DIGITAL MAMMOGRAM.

## 2008-09-20 ENCOUNTER — Encounter: Admission: RE | Admit: 2008-09-20 | Discharge: 2008-09-20 | Payer: Self-pay | Admitting: Family Medicine

## 2008-09-20 IMAGING — US UNKNOWN US STUDY
1 series · 4 of 4 positions shown · non-contrast
Comparison: [DATE]

CLINICAL DATA: The patient returns after screening study for
evaluation of the right breast.

DIGITAL DIAGNOSTIC  RIGHT  MAMMOGRAM   AND RIGHT BREAST ULTRASOUND:

[Series 1: unknown us study · 4 of 4 slices shown]
[im 1/4]
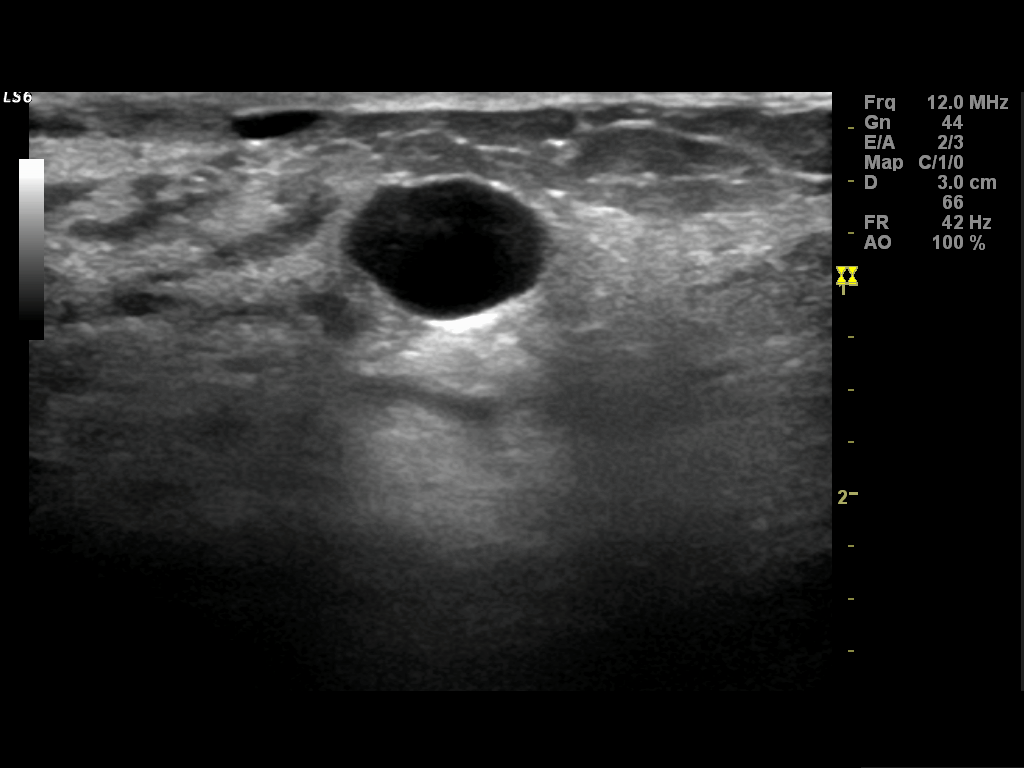
[im 2/4]
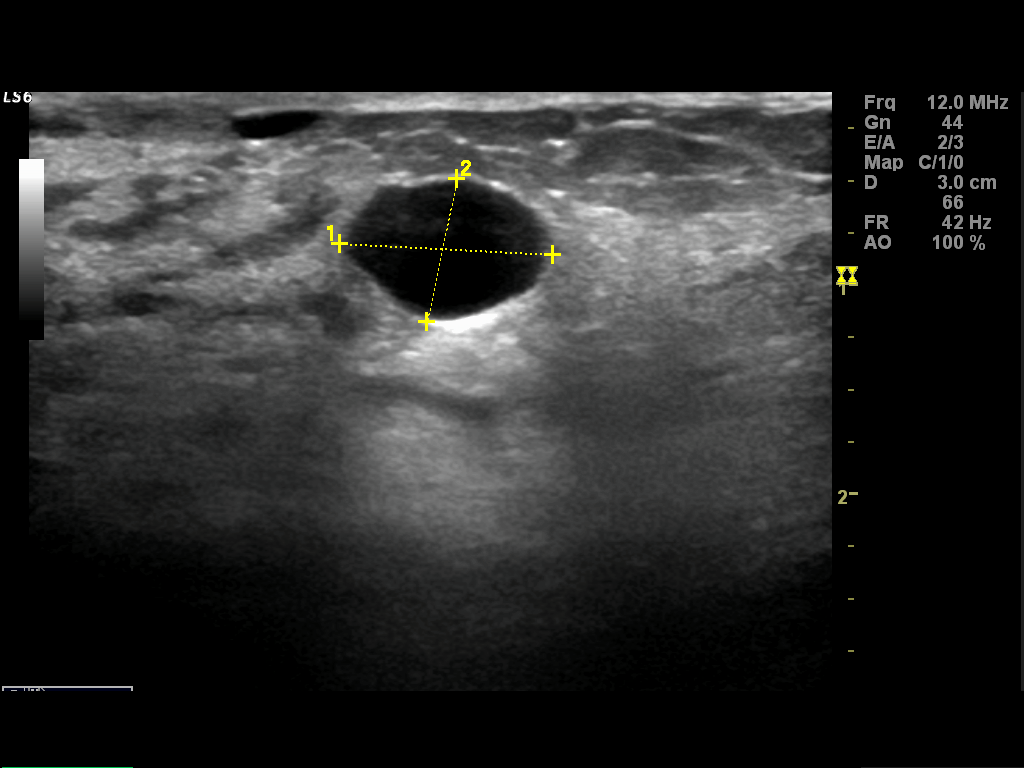
[im 3/4]
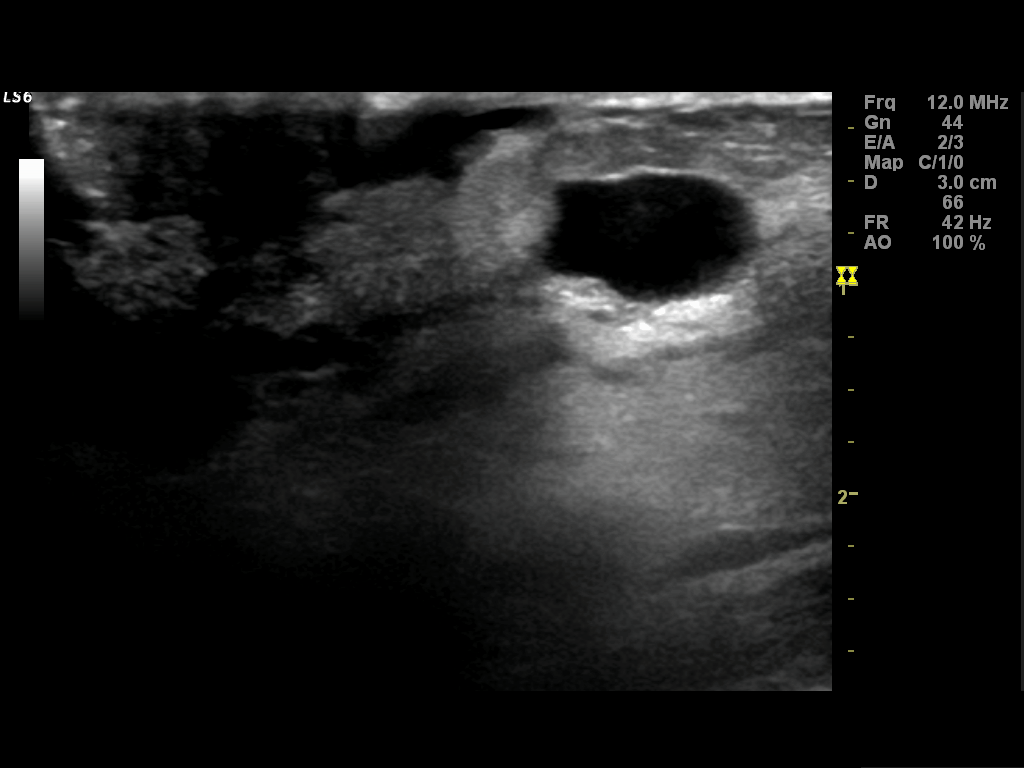
[im 4/4]
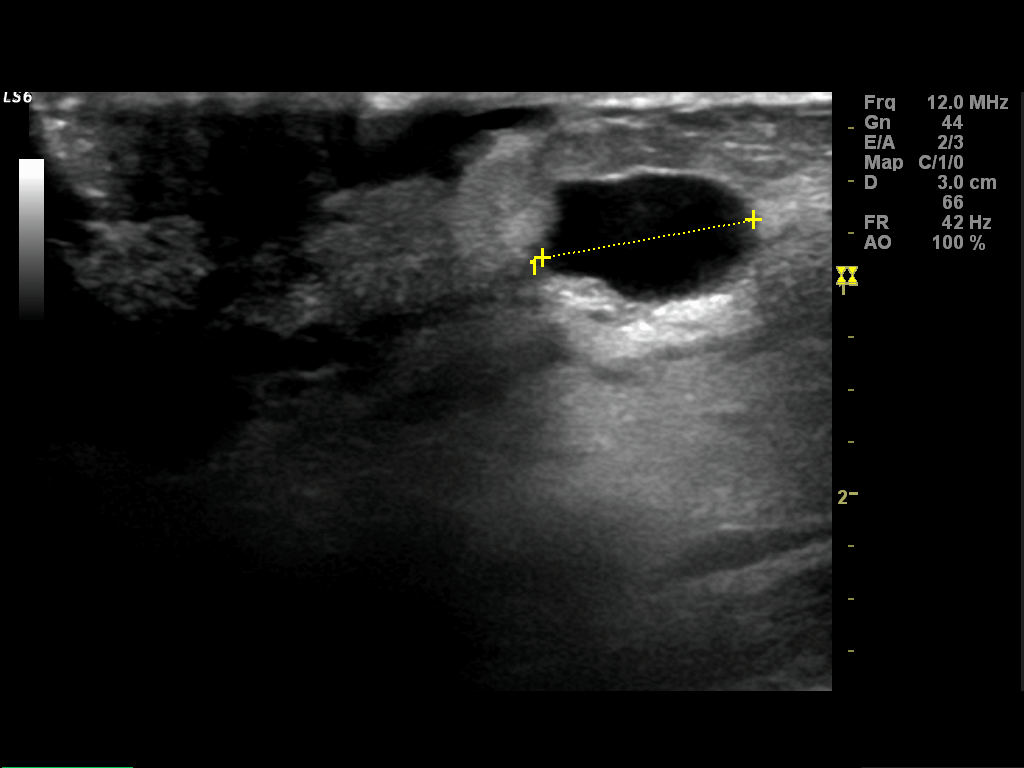

[4 of 4 positions shown; findings below may reference images not displayed]

FINDINGS: Spot compression views of the right breast confirm
presence of a circumscribed nodule in the lower inner quadrant of
the right breast.

On physical exam, I do not palpate an abnormality in the lower
inner quadrant of the right breast.

Ultrasound is performed, showing dense fibroglandular tissue in the
lower inner quadrant on the right.  In the 5 o'clock position, 1 cm
from the nipple, a simple cyst is imaged, measuring 1.0 cm in
diameter.  No solid mass or acoustic shadowing is identified.
IMPRESSION: Persistent abnormality corresponds to a cyst by ultrasound.
Screening mammogram is recommended in 1 year.

BI-RADS CATEGORY 2:  Benign finding(s).

## 2008-09-20 IMAGING — MG MM DIAGNOSTIC LTD RIGHT
3 series · 3 of 3 positions shown · non-contrast
Comparison: [DATE]

CLINICAL DATA: The patient returns after screening study for
evaluation of the right breast.

DIGITAL DIAGNOSTIC  RIGHT  MAMMOGRAM   AND RIGHT BREAST ULTRASOUND:

[R CC (1 of 2)]
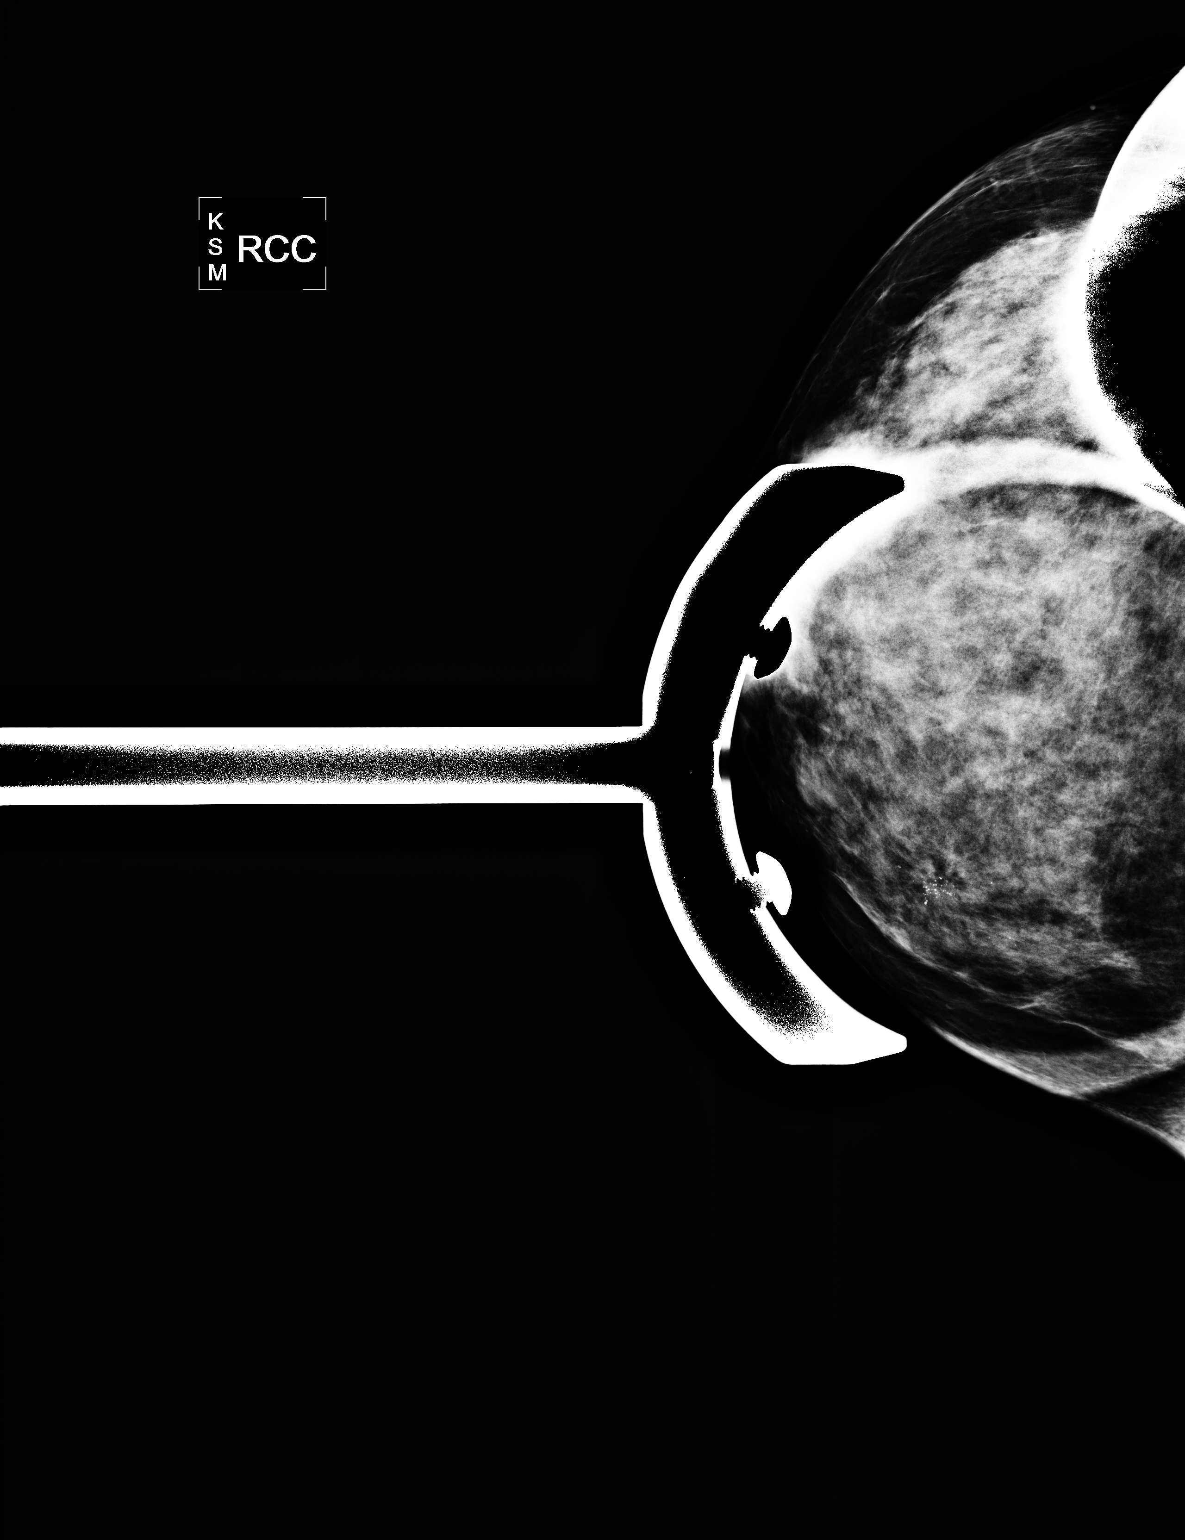

[R MLO]
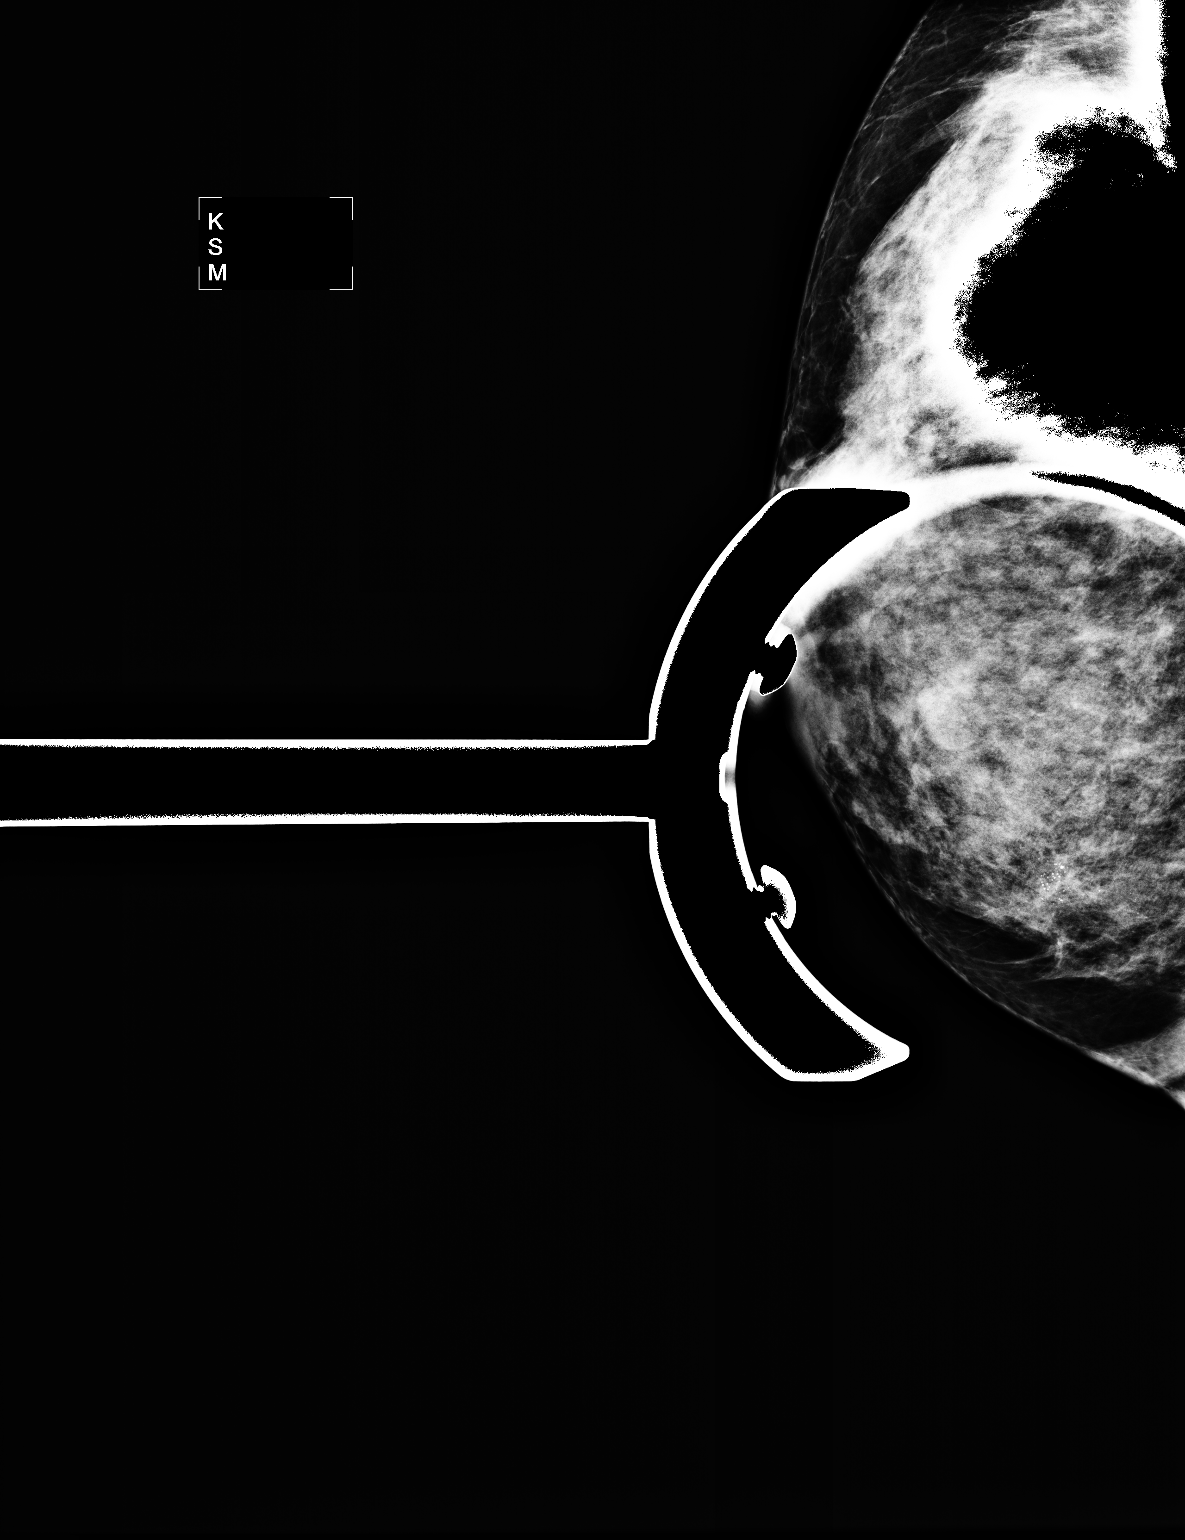

[R CC (2 of 2)]
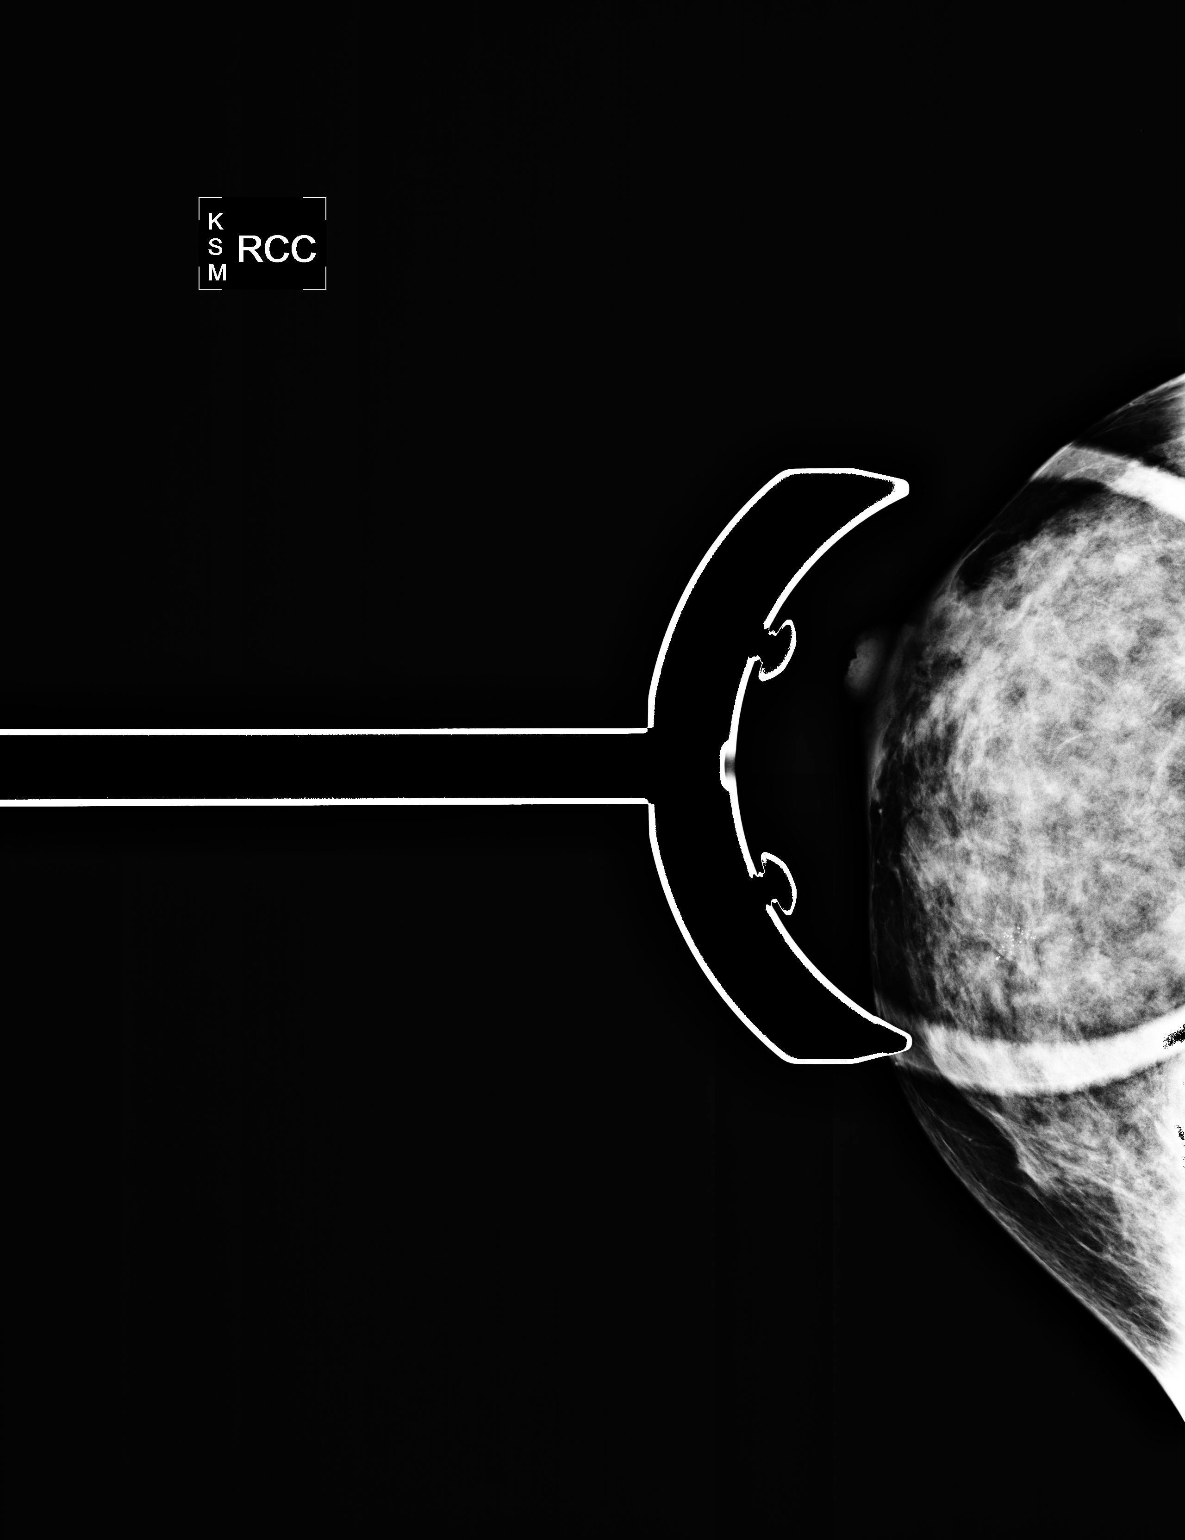

[3 of 3 positions shown; findings below may reference images not displayed]

FINDINGS: Spot compression views of the right breast confirm
presence of a circumscribed nodule in the lower inner quadrant of
the right breast.

On physical exam, I do not palpate an abnormality in the lower
inner quadrant of the right breast.

Ultrasound is performed, showing dense fibroglandular tissue in the
lower inner quadrant on the right.  In the 5 o'clock position, 1 cm
from the nipple, a simple cyst is imaged, measuring 1.0 cm in
diameter.  No solid mass or acoustic shadowing is identified.
IMPRESSION: Persistent abnormality corresponds to a cyst by ultrasound.
Screening mammogram is recommended in 1 year.

BI-RADS CATEGORY 2:  Benign finding(s).

## 2009-11-06 ENCOUNTER — Encounter: Admission: RE | Admit: 2009-11-06 | Discharge: 2009-11-06 | Payer: Self-pay | Admitting: Family Medicine

## 2009-11-06 IMAGING — MG MM DIGITAL SCREENING
4 series · 4 of 4 positions shown · non-contrast
Comparison: none

DG SCREEN MAMMOGRAM BILATERAL
Bilateral CC and MLO view(s) were taken.

DIGITAL SCREENING MAMMOGRAM WITH CAD:
The breast tissue is extremely dense.  No masses or malignant type calcifications are identified.  
Compared with prior studies.
Images were processed with CAD.

[R CC]
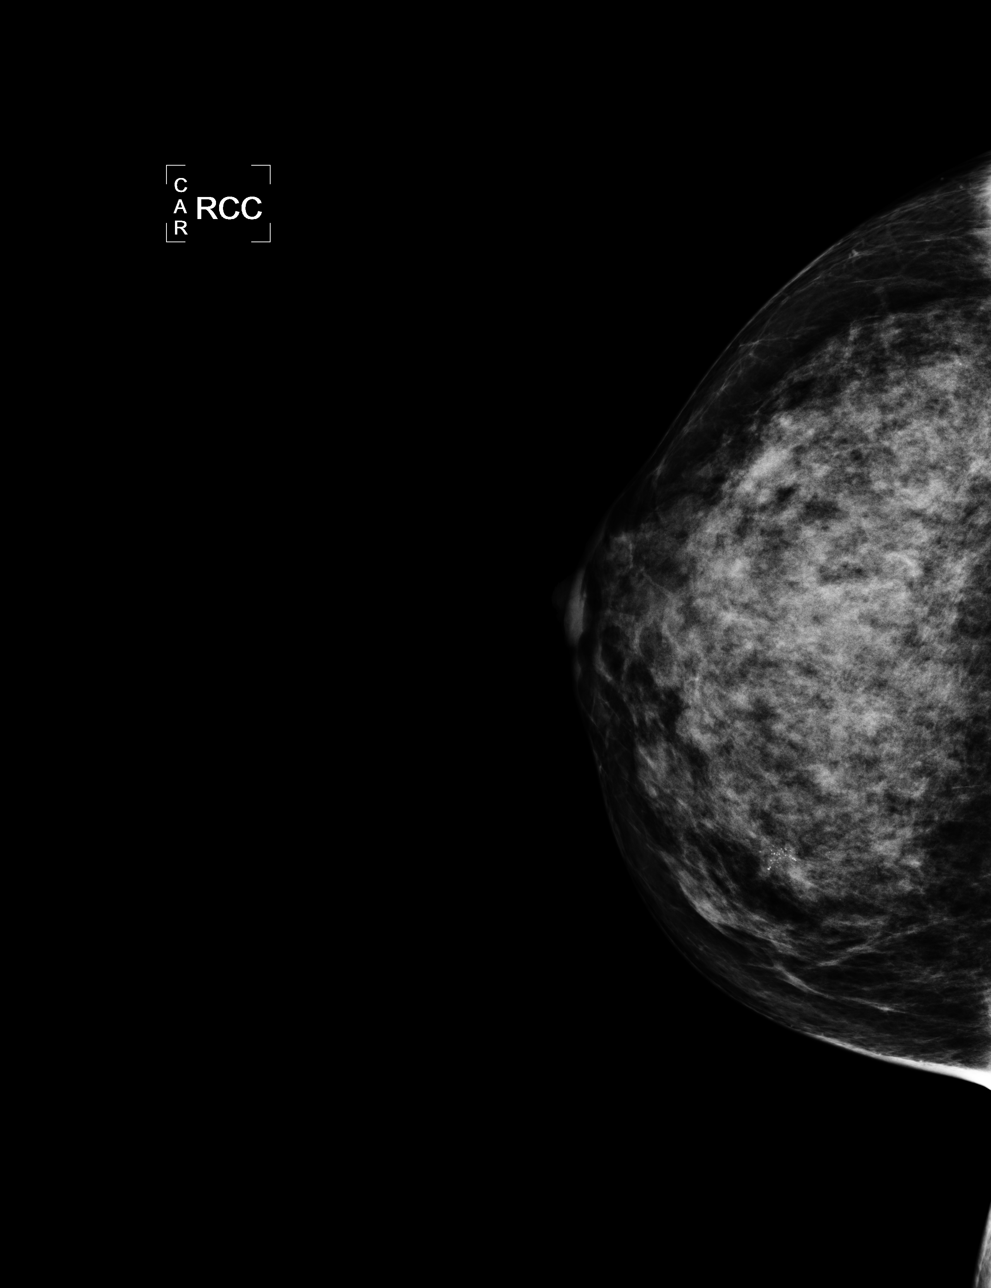

[L CC]
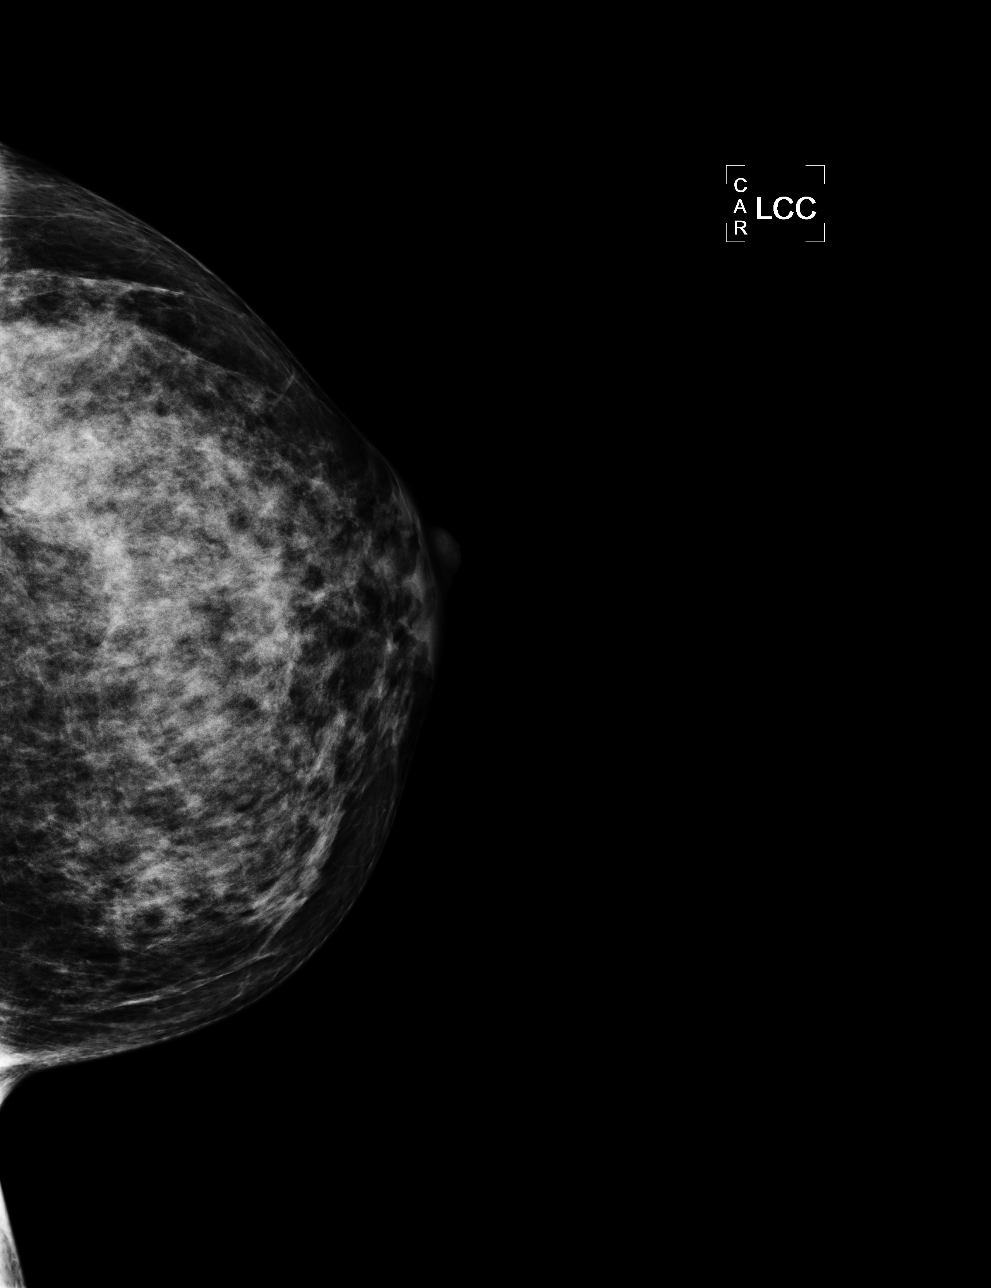

[L MLO]
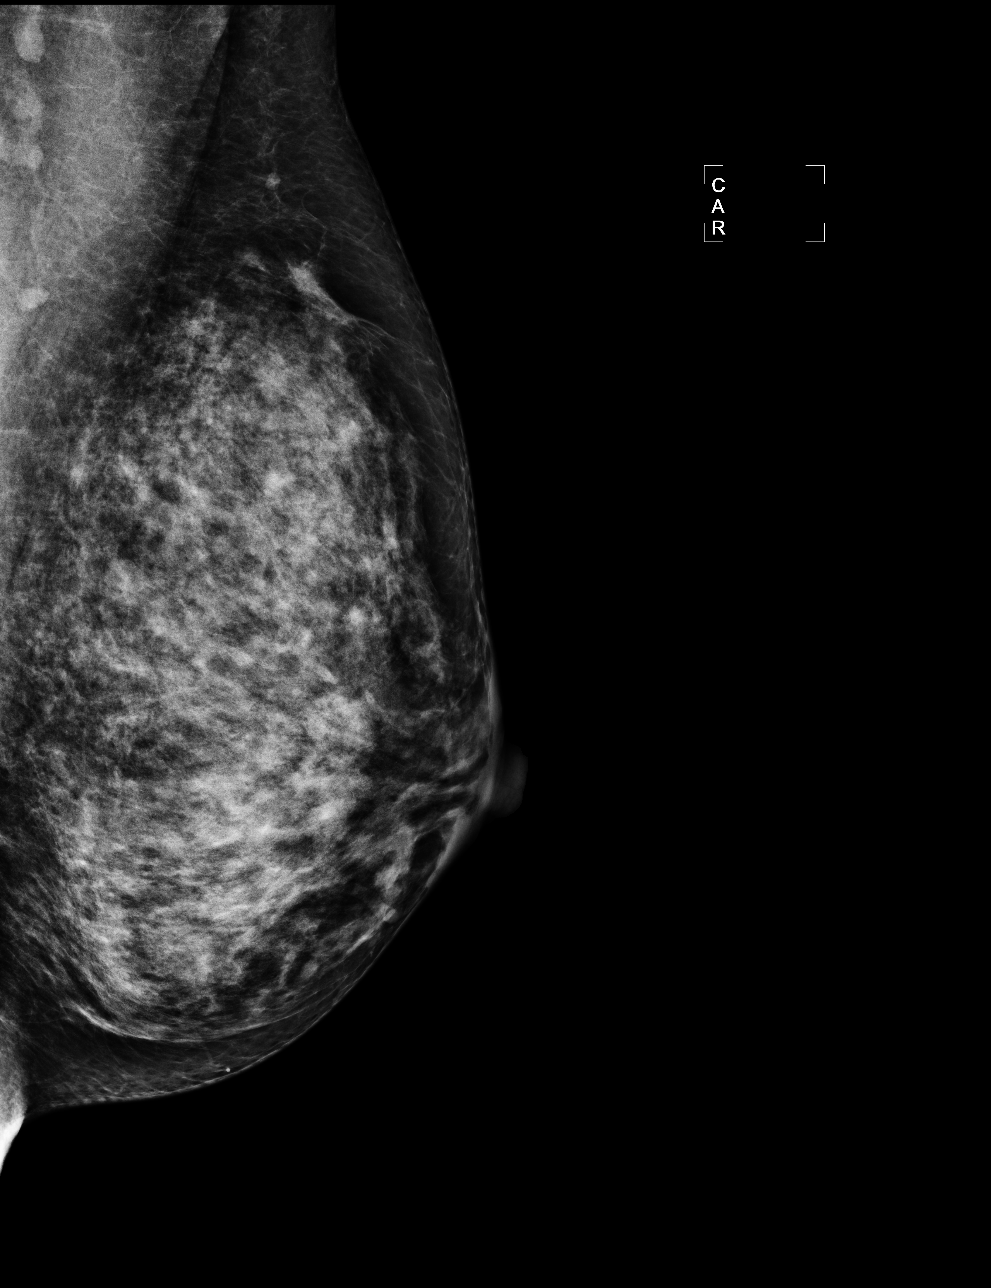

[R MLO]
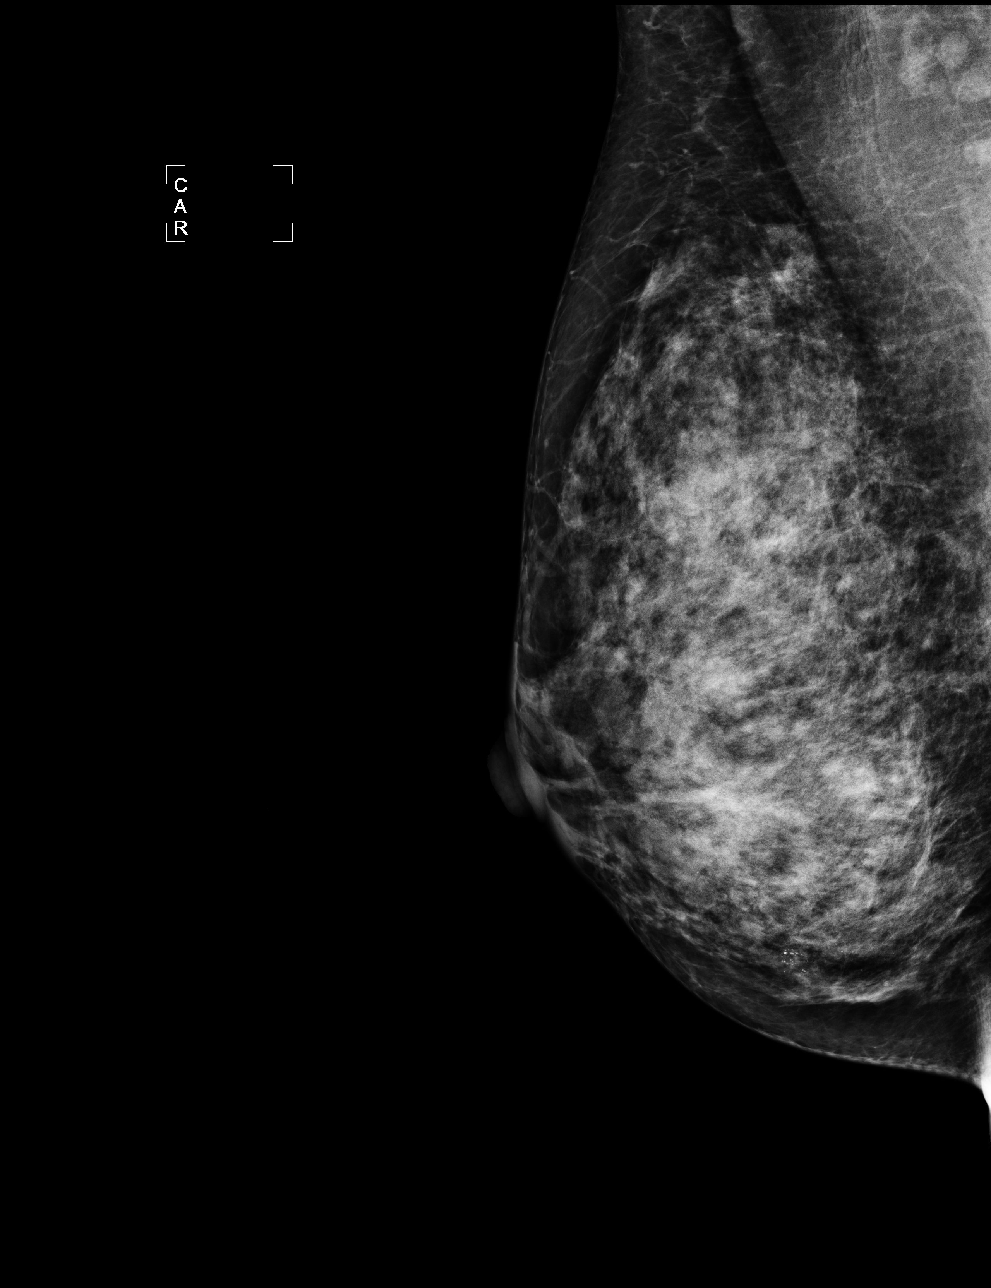

[4 of 4 positions shown; findings below may reference images not displayed]

IMPRESSION: No specific mammographic evidence of malignancy.  Next screening mammogram is recommended in one 
year.

A result letter of this screening mammogram will be mailed directly to the patient.

ASSESSMENT: Negative - BI-RADS 1

Screening mammogram in 1 year.
,

## 2010-05-08 ENCOUNTER — Encounter: Admission: RE | Admit: 2010-05-08 | Discharge: 2010-05-08 | Payer: Self-pay | Admitting: Family Medicine

## 2010-05-08 IMAGING — US US BREAST R
1 series · 4 of 4 positions shown · non-contrast
Comparison: [DATE], [DATE], [DATE]

CLINICAL DATA: The patient feels a lump at 3 o'clock in the right
breast.

DIGITAL DIAGNOSTIC RIGHT MAMMOGRAM WITH CAD AND RIGHT BREAST
ULTRASOUND:

[Series 1: us breast right · 4 of 4 slices shown]
[im 1/4]
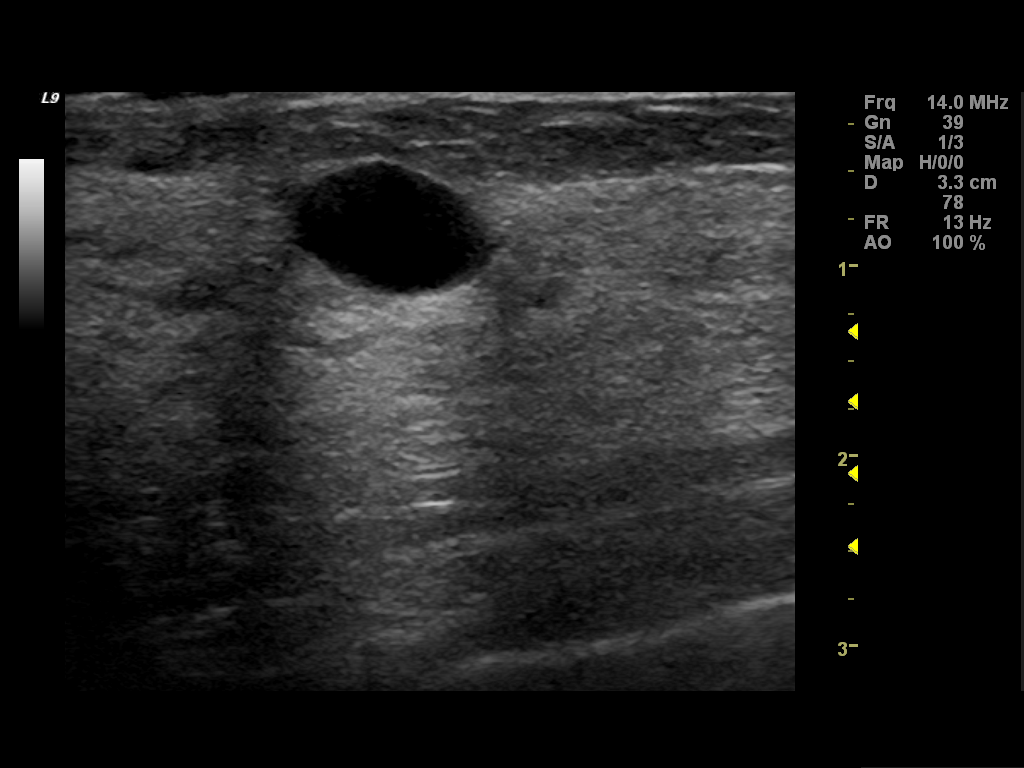
[im 2/4]
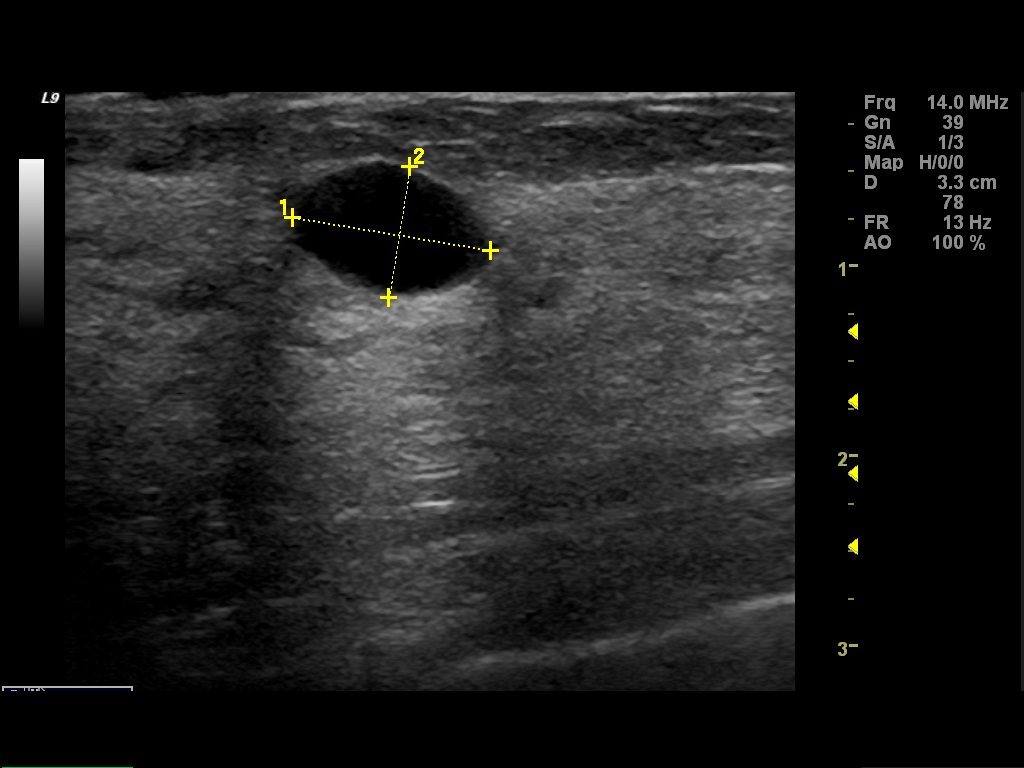
[im 3/4]
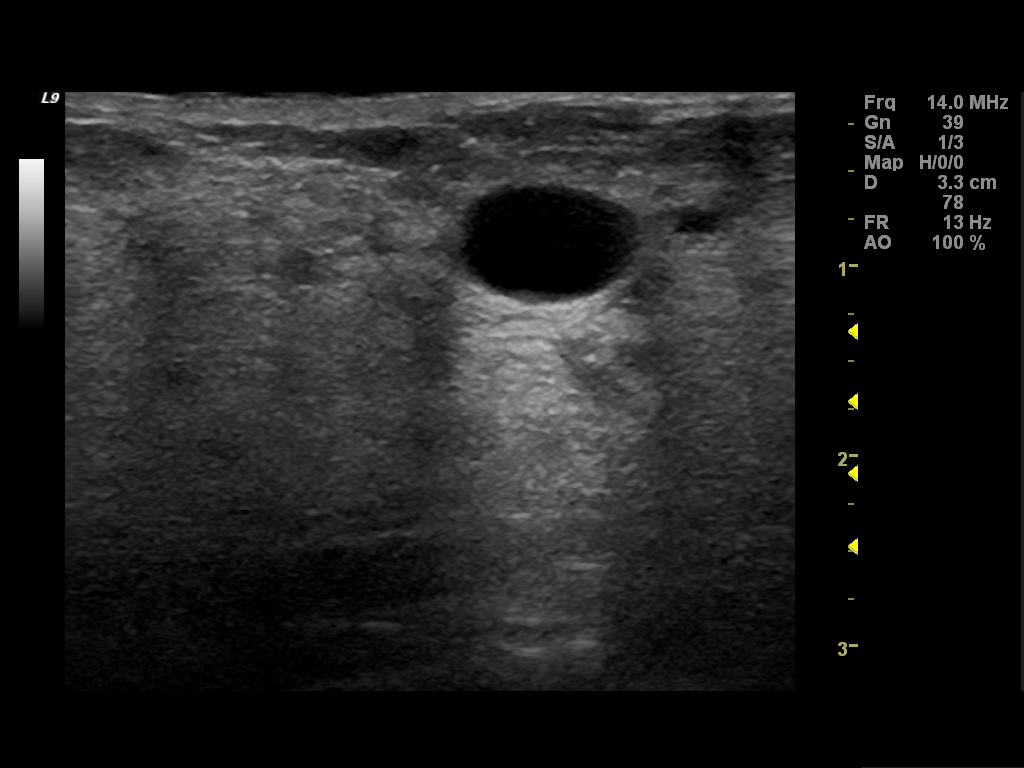
[im 4/4]
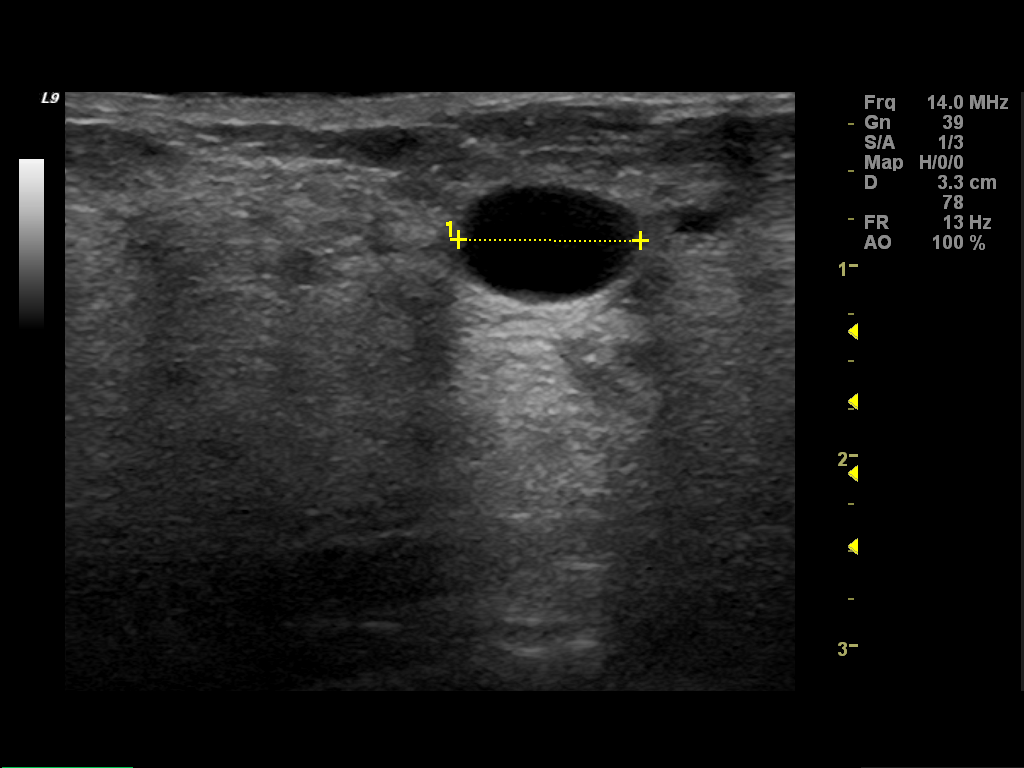

[4 of 4 positions shown; findings below may reference images not displayed]

FINDINGS: The breast tissue is heterogeneously dense.  There is no
dominant mass, architectural distortion or calcification to suggest
malignancy.  Previously biopsied benign calcifications in the
medial aspect of the right breast are stable.
Mammographic images were processed with CAD.

On physical exam, I palpate a subtle nodularity at 3 o'clock 2 cm
from the right nipple.

Ultrasound is performed, showing a simple cyst in this location
measuring 1 cm.
IMPRESSION: No mammographic or sonographic evidence of malignancy.  The area of
clinical concern corresponds to a simple cyst.  Yearly screening
mammography is suggested with next scheduled exam in [DATE].

BI-RADS CATEGORY 2:  Benign finding(s).

## 2010-05-08 IMAGING — MG MM DIGITAL DIAGNOSTIC UNILAT R
3 series · 3 of 3 positions shown · non-contrast
Comparison: [DATE], [DATE], [DATE]

CLINICAL DATA: The patient feels a lump at 3 o'clock in the right
breast.

DIGITAL DIAGNOSTIC RIGHT MAMMOGRAM WITH CAD AND RIGHT BREAST
ULTRASOUND:

[R CC]
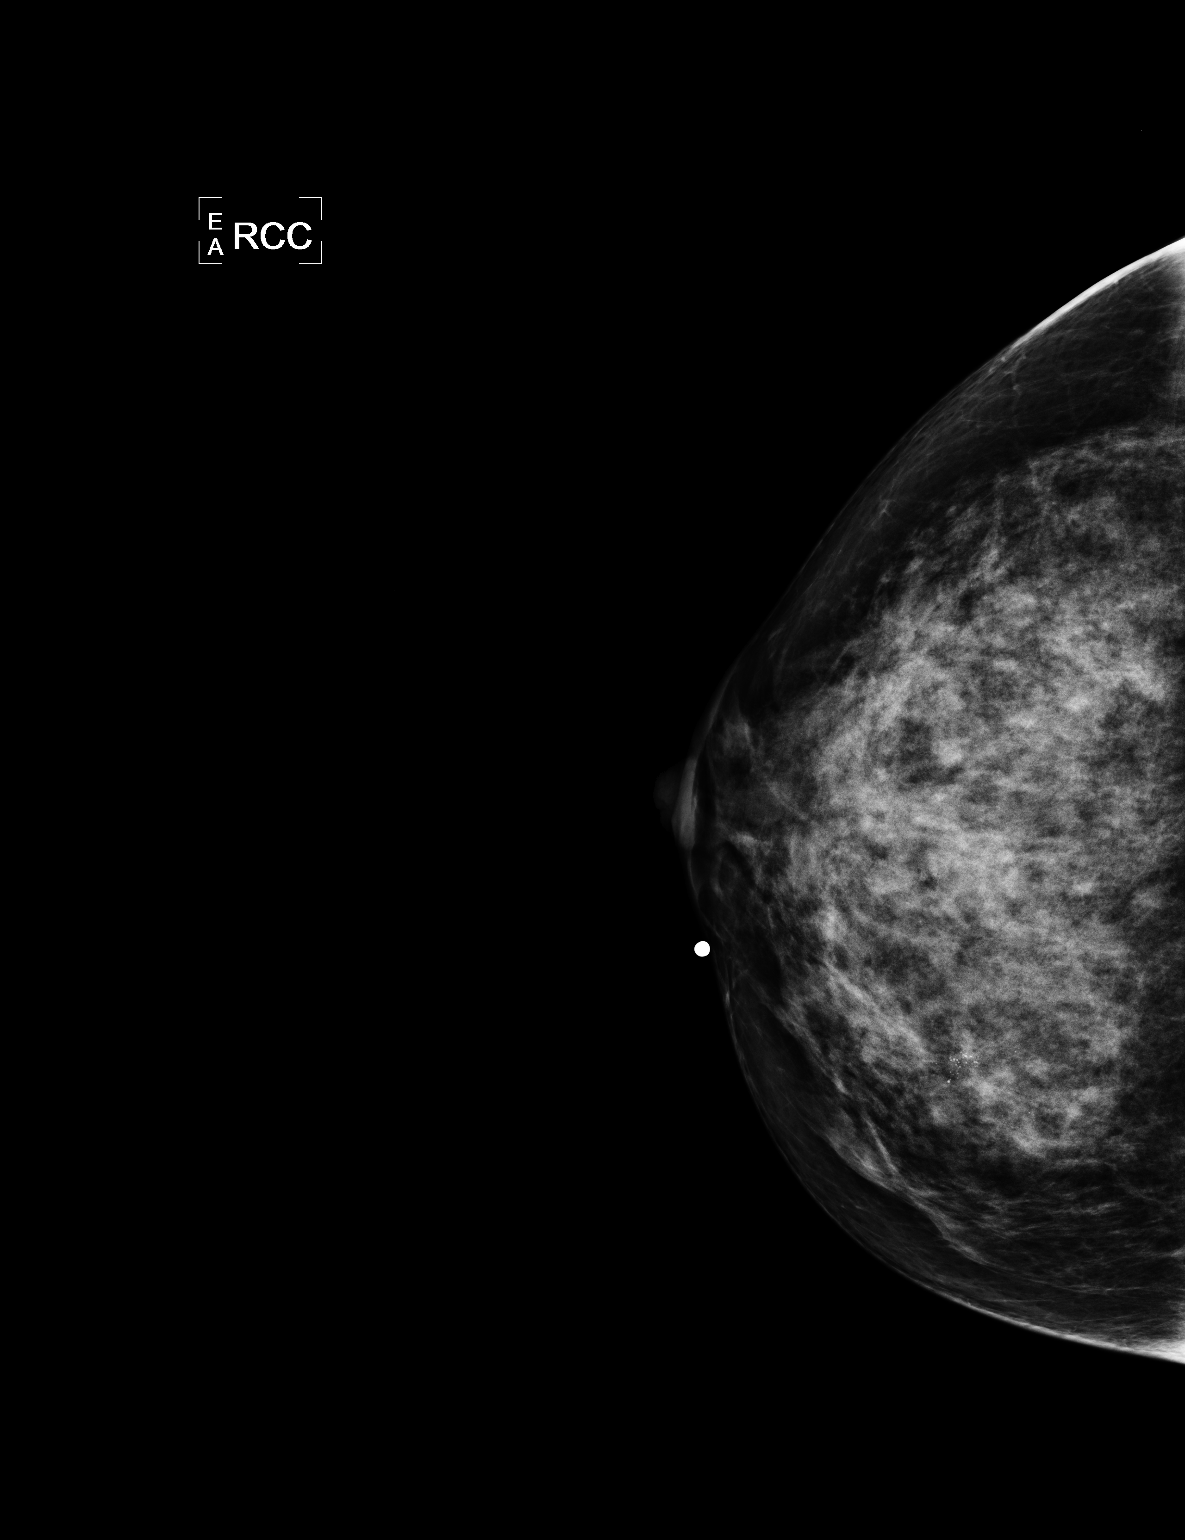

[R MLO]
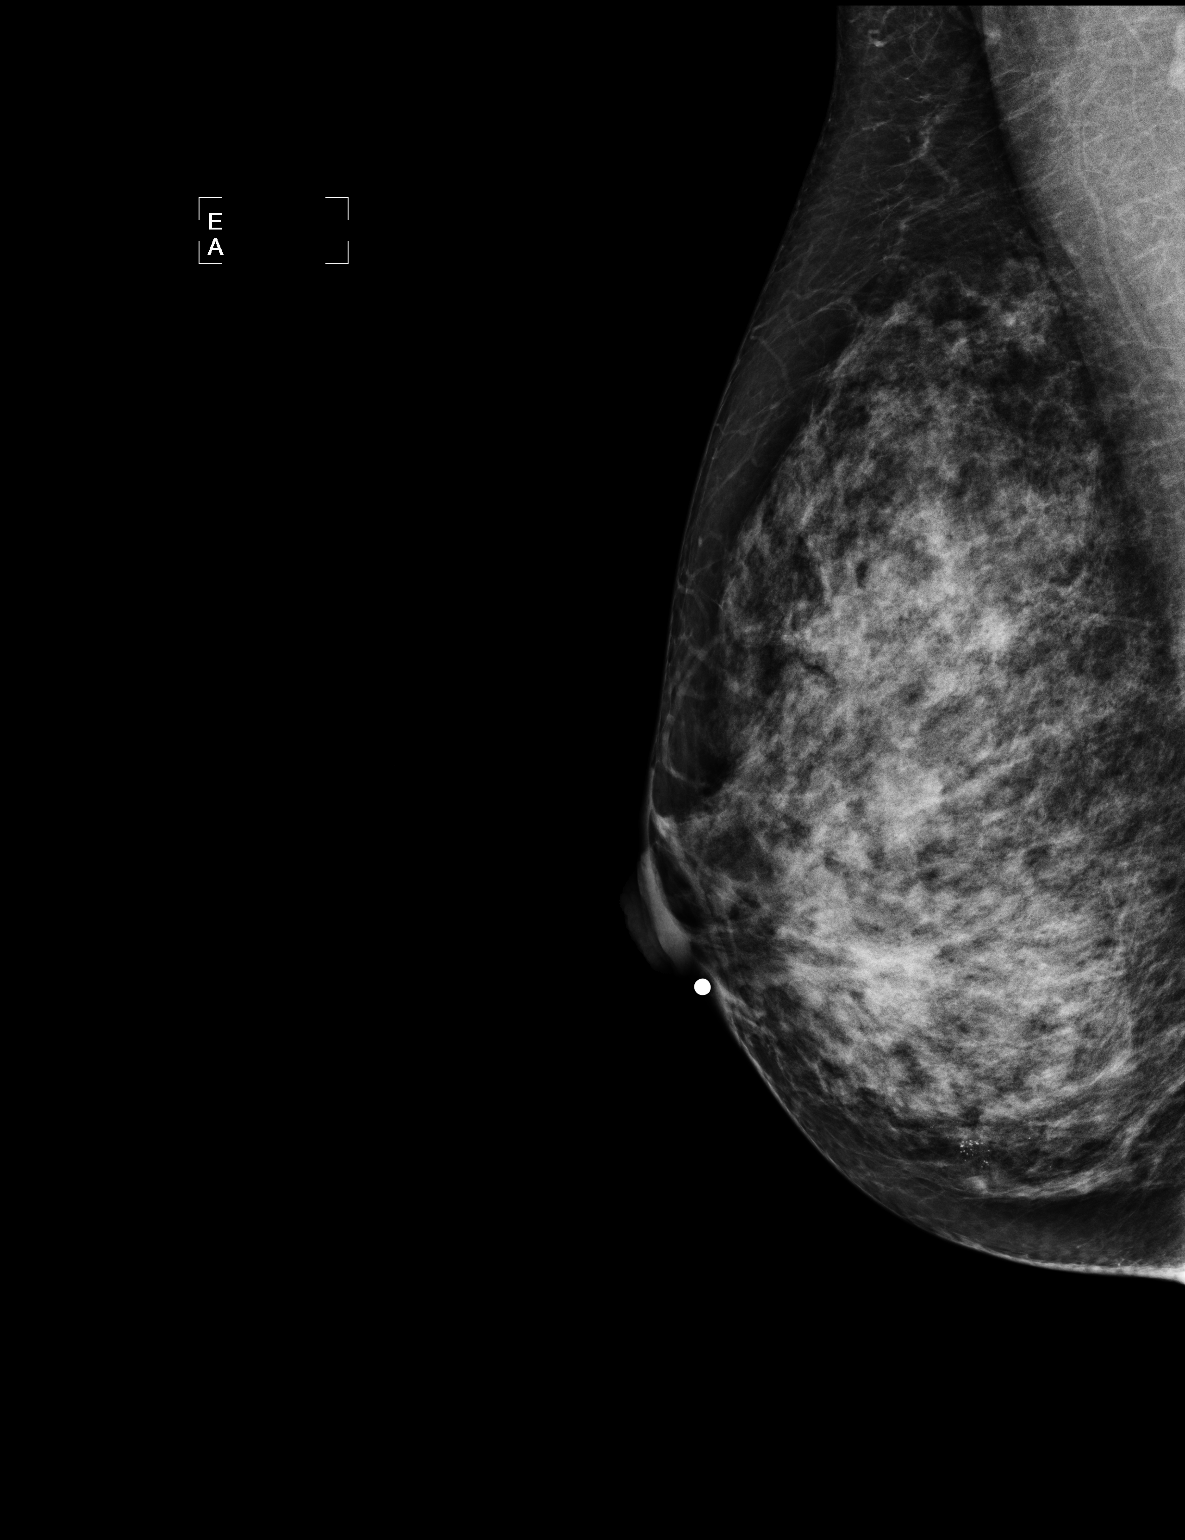

[R TAN]
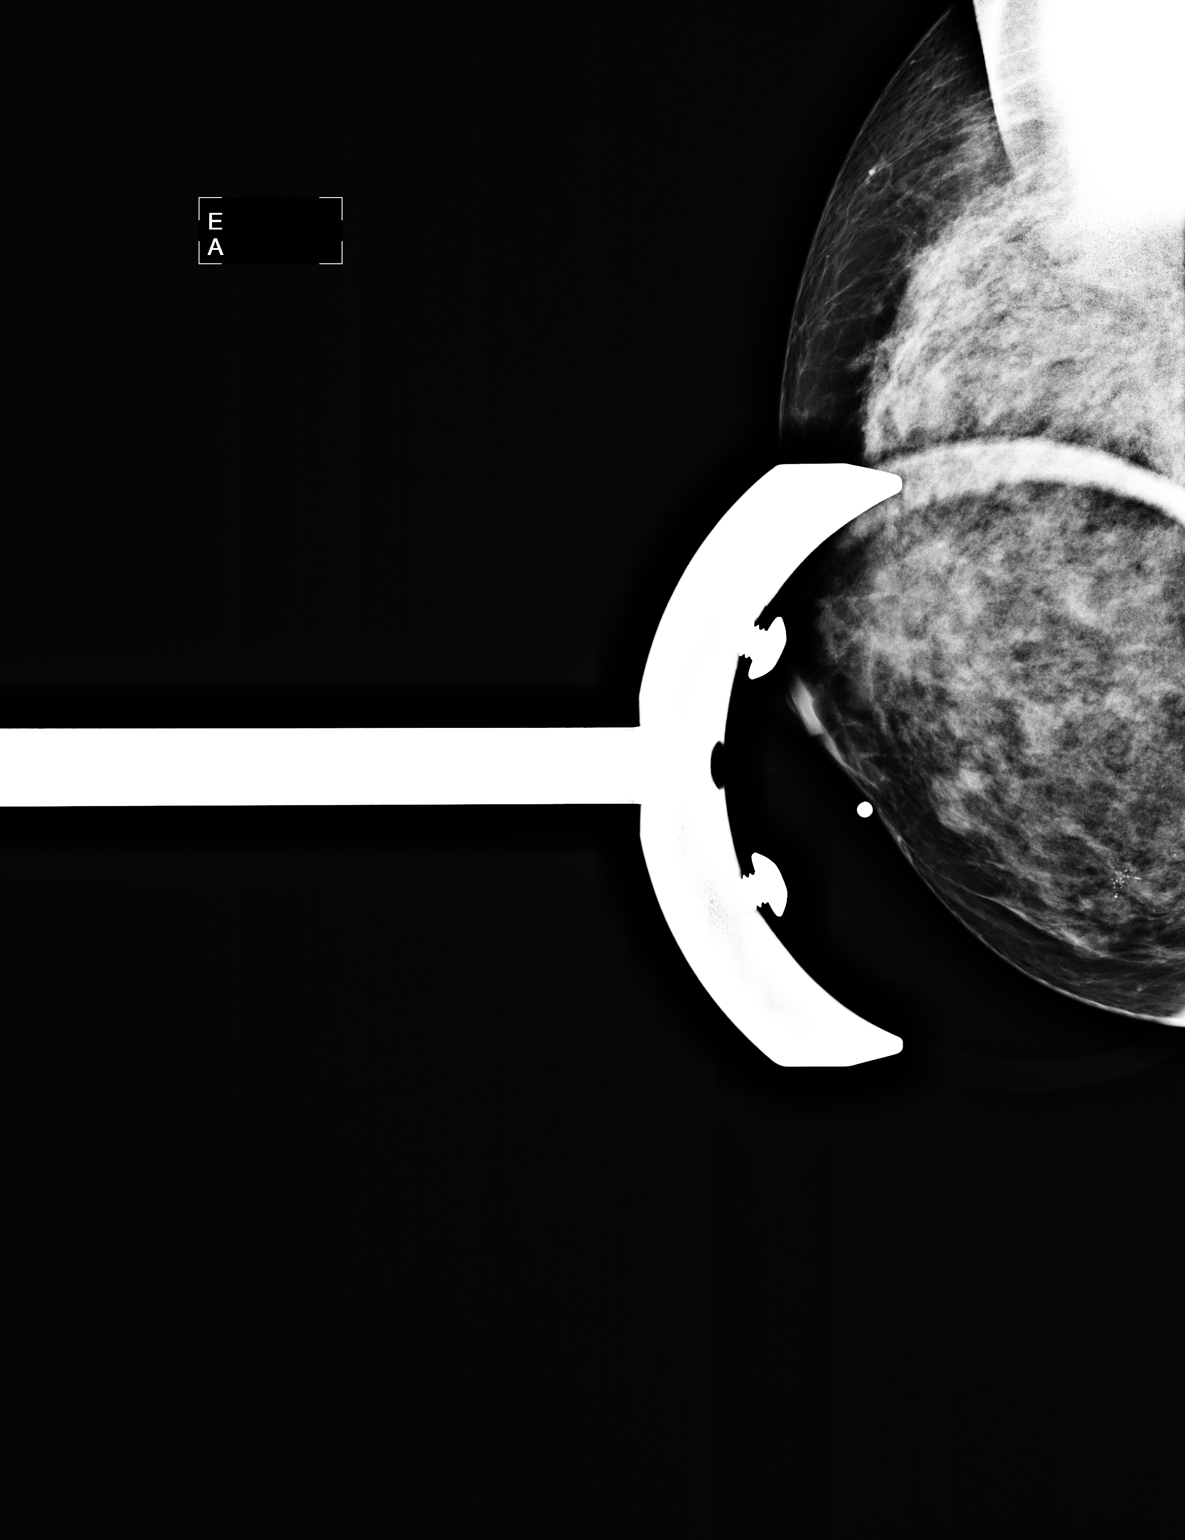

[3 of 3 positions shown; findings below may reference images not displayed]

FINDINGS: The breast tissue is heterogeneously dense.  There is no
dominant mass, architectural distortion or calcification to suggest
malignancy.  Previously biopsied benign calcifications in the
medial aspect of the right breast are stable.
Mammographic images were processed with CAD.

On physical exam, I palpate a subtle nodularity at 3 o'clock 2 cm
from the right nipple.

Ultrasound is performed, showing a simple cyst in this location
measuring 1 cm.
IMPRESSION: No mammographic or sonographic evidence of malignancy.  The area of
clinical concern corresponds to a simple cyst.  Yearly screening
mammography is suggested with next scheduled exam in [DATE].

BI-RADS CATEGORY 2:  Benign finding(s).

## 2011-11-04 ENCOUNTER — Other Ambulatory Visit: Payer: Self-pay | Admitting: Family Medicine

## 2011-11-04 DIAGNOSIS — Z1231 Encounter for screening mammogram for malignant neoplasm of breast: Secondary | ICD-10-CM

## 2011-11-13 ENCOUNTER — Ambulatory Visit
Admission: RE | Admit: 2011-11-13 | Discharge: 2011-11-13 | Disposition: A | Discharge status: Home / Self Care | Payer: BC Managed Care – PPO | Source: Ambulatory Visit | Attending: Family Medicine | Admitting: Family Medicine

## 2011-11-13 DIAGNOSIS — Z1231 Encounter for screening mammogram for malignant neoplasm of breast: Secondary | ICD-10-CM

## 2011-11-13 IMAGING — MG MM DIGITAL SCREENING BILAT
4 series · 4 of 4 positions shown · non-contrast
Comparison: Prior studies.

DG SCREEN MAMMOGRAM BILATERAL
Bilateral CC and MLO view(s) were taken.
Prior study comparison: [DATE], bilateral breast ultrasound.

DIGITAL SCREENING MAMMOGRAM WITH CAD:

[R CC]
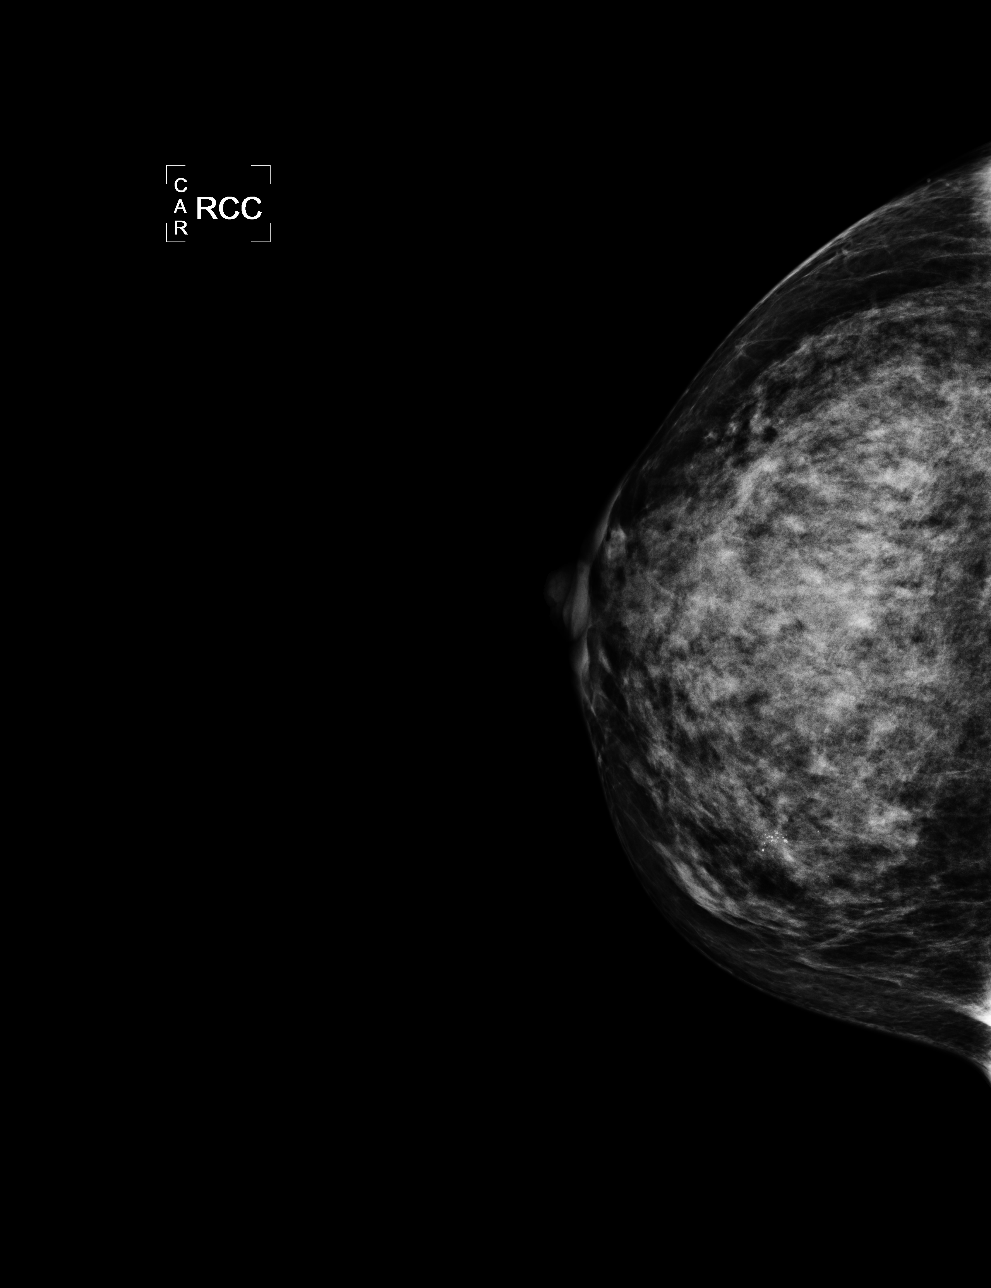

[L CC]
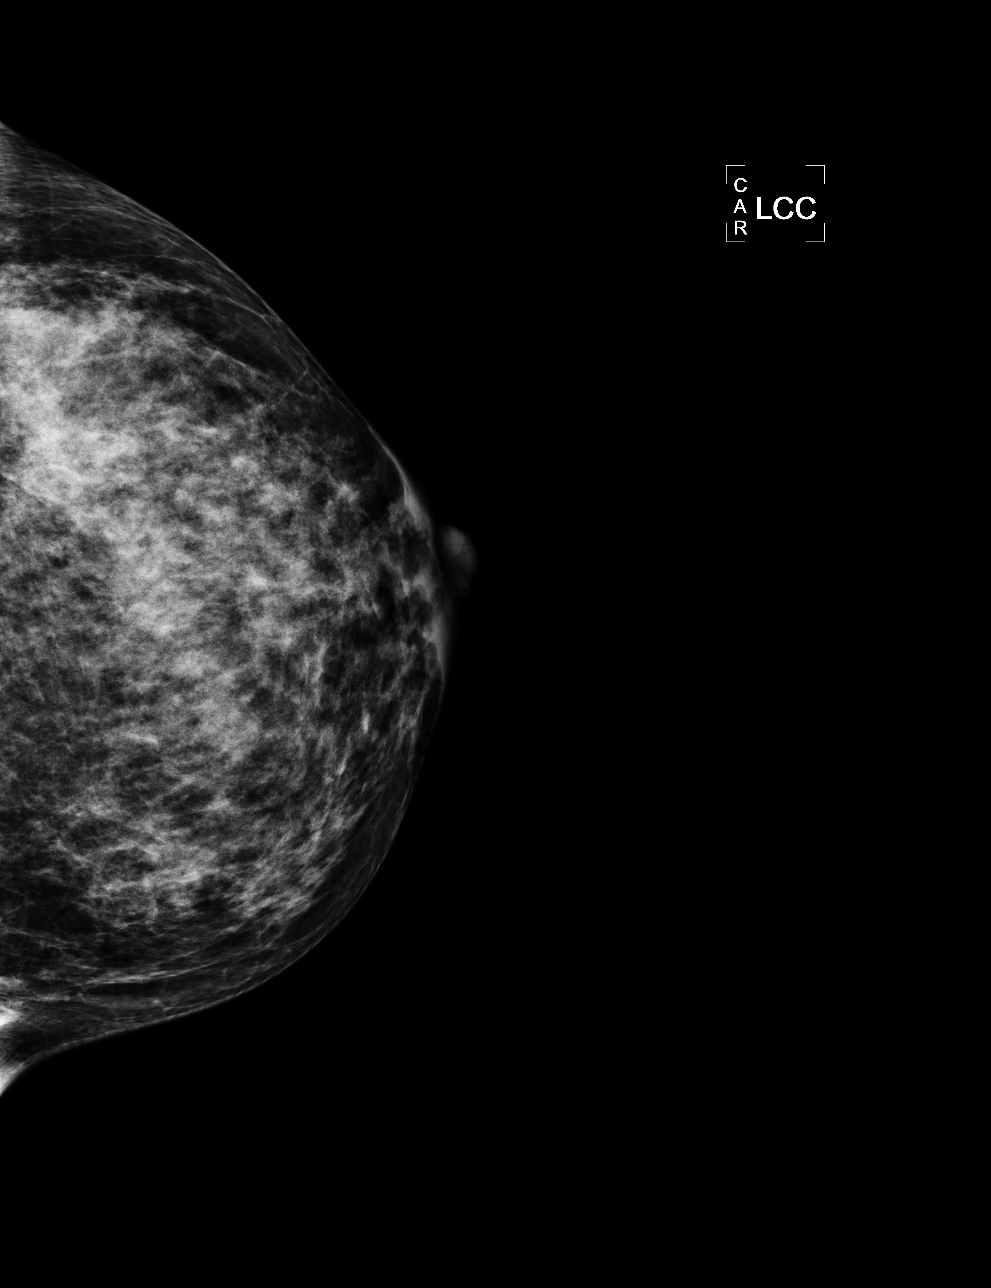

[L MLO]
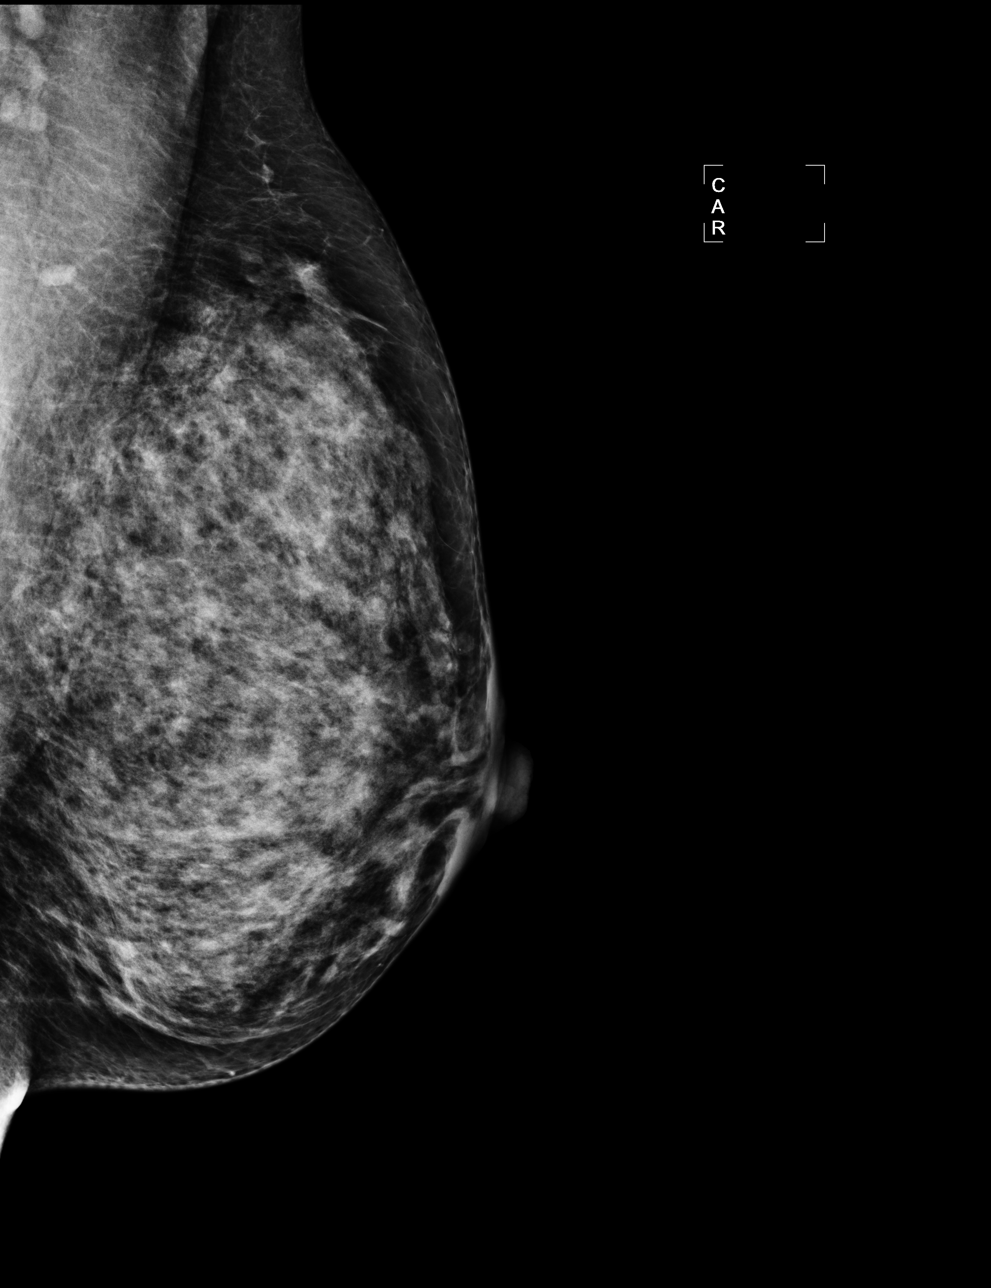

[R MLO]
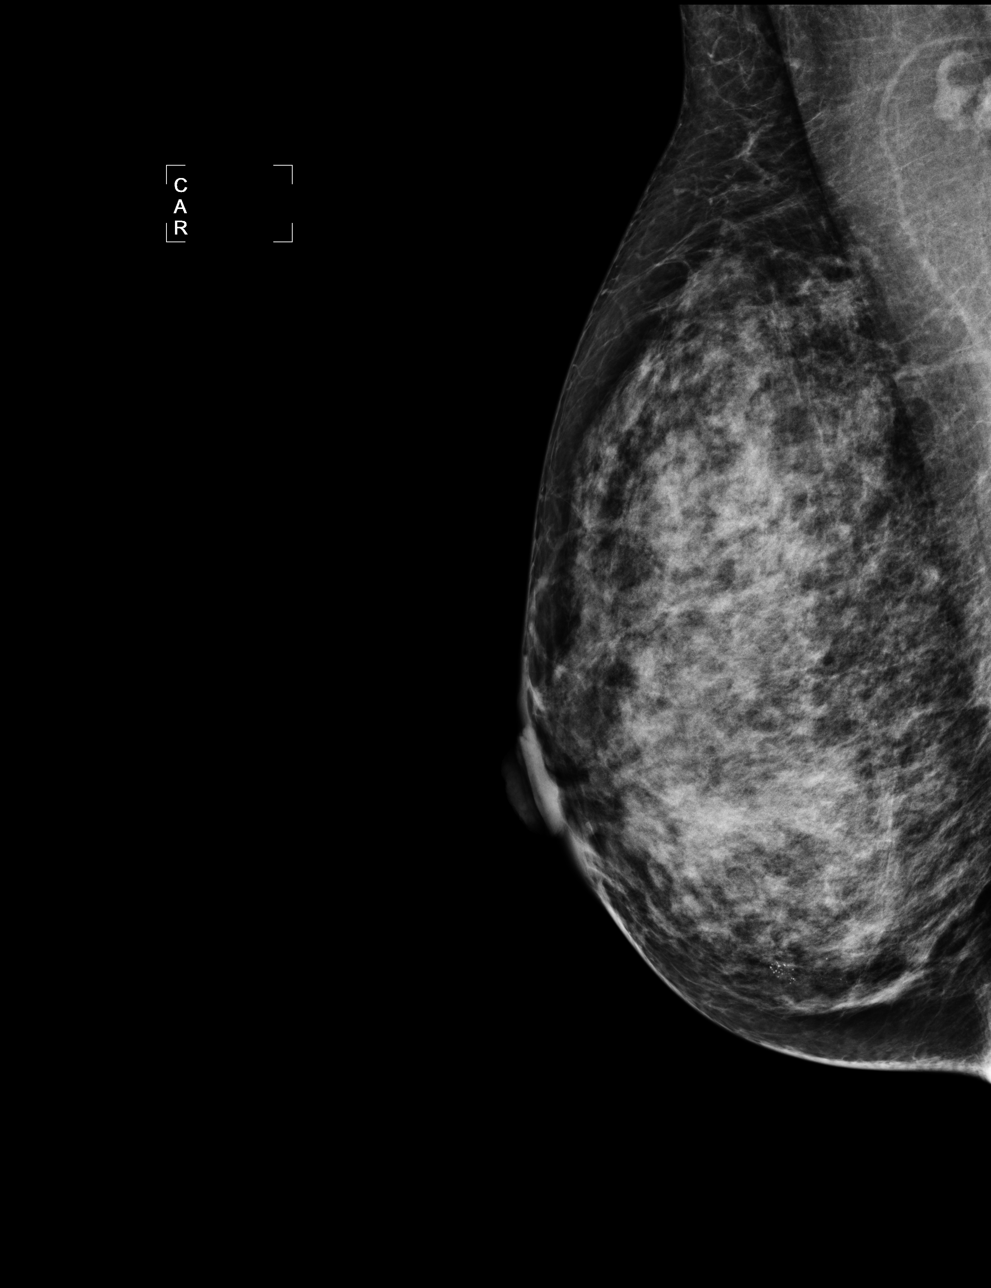

[4 of 4 positions shown; findings below may reference images not displayed]

The breast tissue is extremely dense.  There is no dominant mass, architectural distortion or 
calcification to suggest malignancy.

Images were processed with CAD.
IMPRESSION: No mammographic evidence of malignancy.  Suggest yearly screening mammography.

A result letter of this screening mammogram will be mailed directly to the patient.

ASSESSMENT: Negative - BI-RADS 1

Screening mammogram in 1 year.
,

## 2011-11-26 DIAGNOSIS — C801 Malignant (primary) neoplasm, unspecified: Secondary | ICD-10-CM

## 2011-11-26 HISTORY — PX: MELANOMA EXCISION: SHX5266

## 2011-11-26 HISTORY — DX: Malignant (primary) neoplasm, unspecified: C80.1

## 2013-05-17 ENCOUNTER — Other Ambulatory Visit: Payer: Self-pay

## 2013-05-17 DIAGNOSIS — Z1231 Encounter for screening mammogram for malignant neoplasm of breast: Secondary | ICD-10-CM

## 2013-05-25 ENCOUNTER — Ambulatory Visit
Admission: RE | Admit: 2013-05-25 | Discharge: 2013-05-25 | Disposition: A | Discharge status: Home / Self Care | Payer: BC Managed Care – PPO | Source: Ambulatory Visit

## 2013-05-25 DIAGNOSIS — Z1231 Encounter for screening mammogram for malignant neoplasm of breast: Secondary | ICD-10-CM

## 2013-05-25 IMAGING — MG MM DIGITAL SCREENING BILAT
4 series · 4 of 4 positions shown · non-contrast
Comparison: Previous exams.

CLINICAL DATA: Screening.

DIGITAL SCREENING BILATERAL MAMMOGRAM WITH CAD

[R CC]
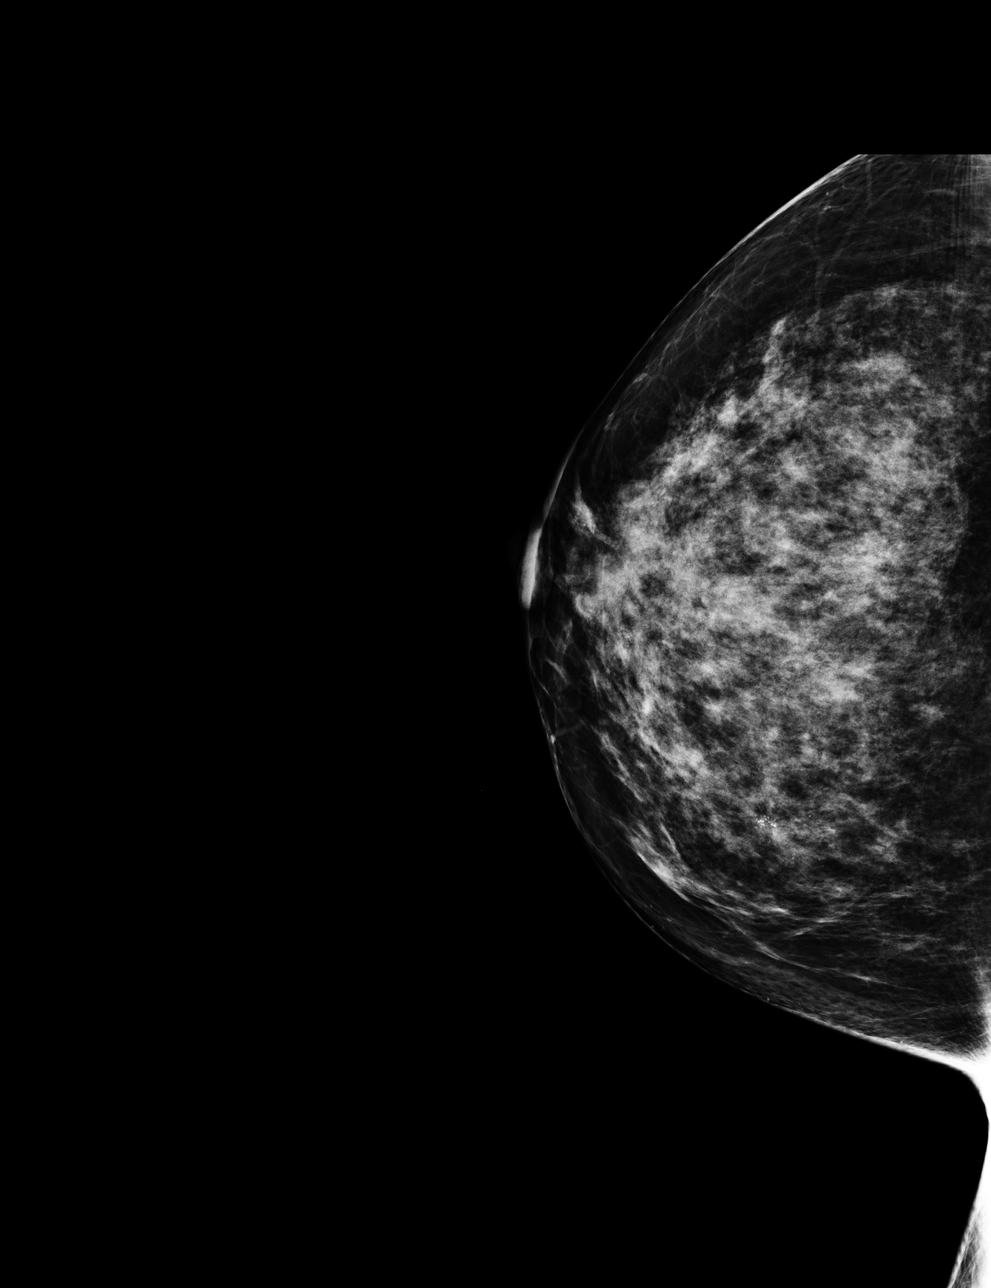

[L MLO]
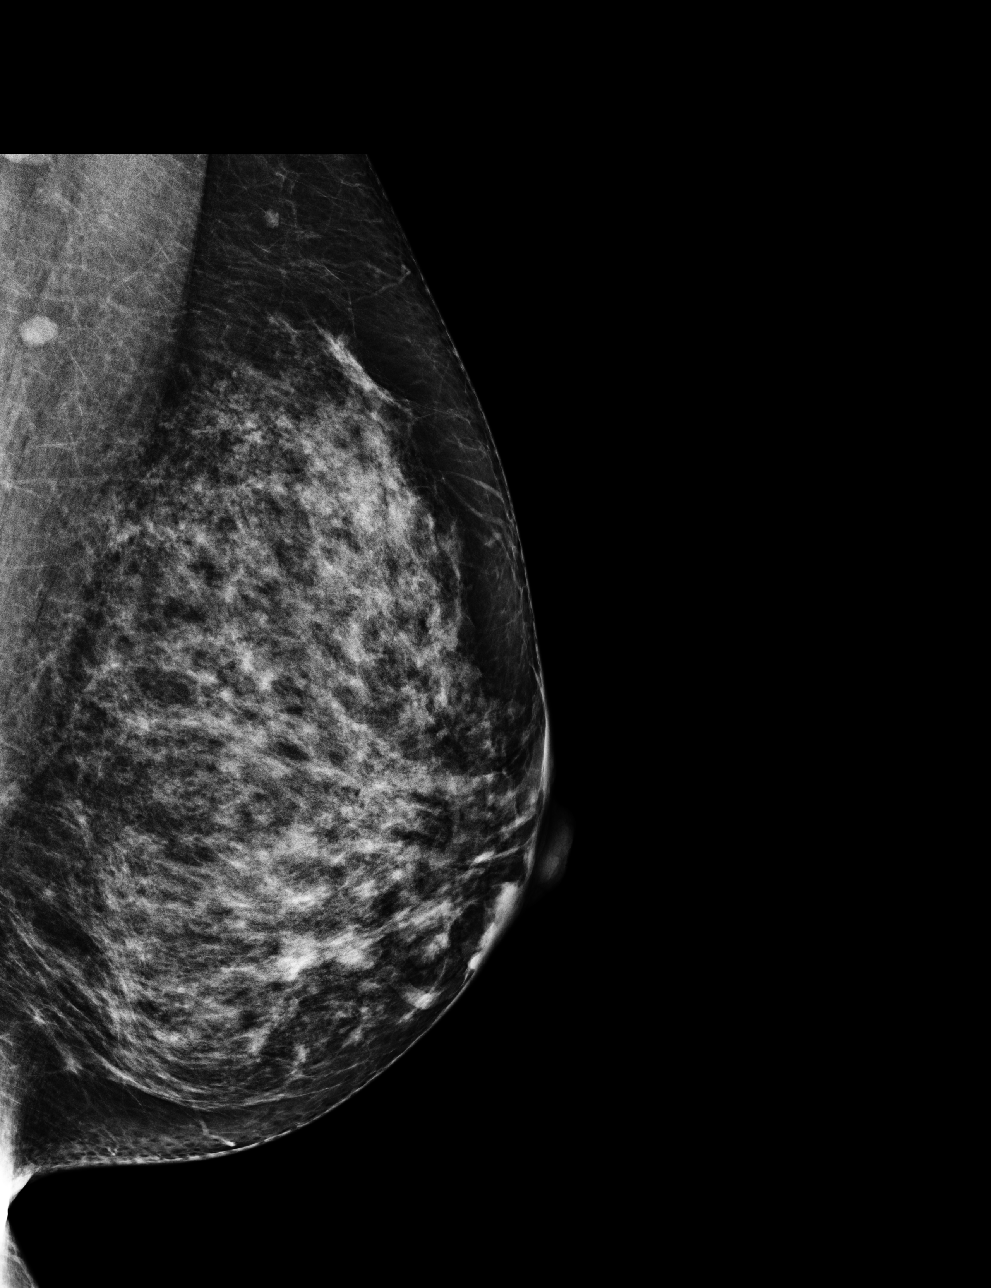

[L CC]
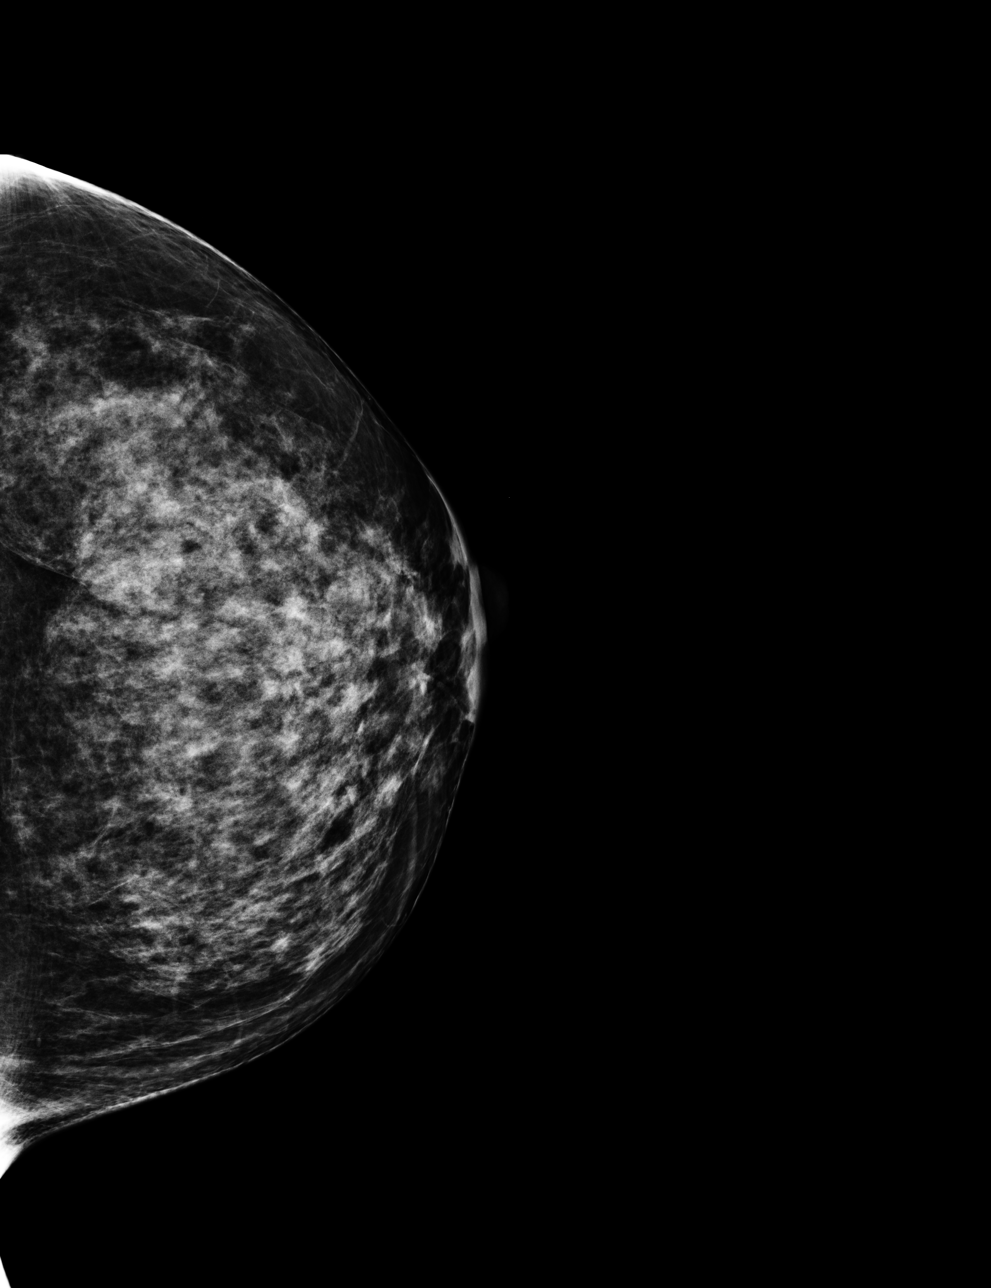

[R MLO]
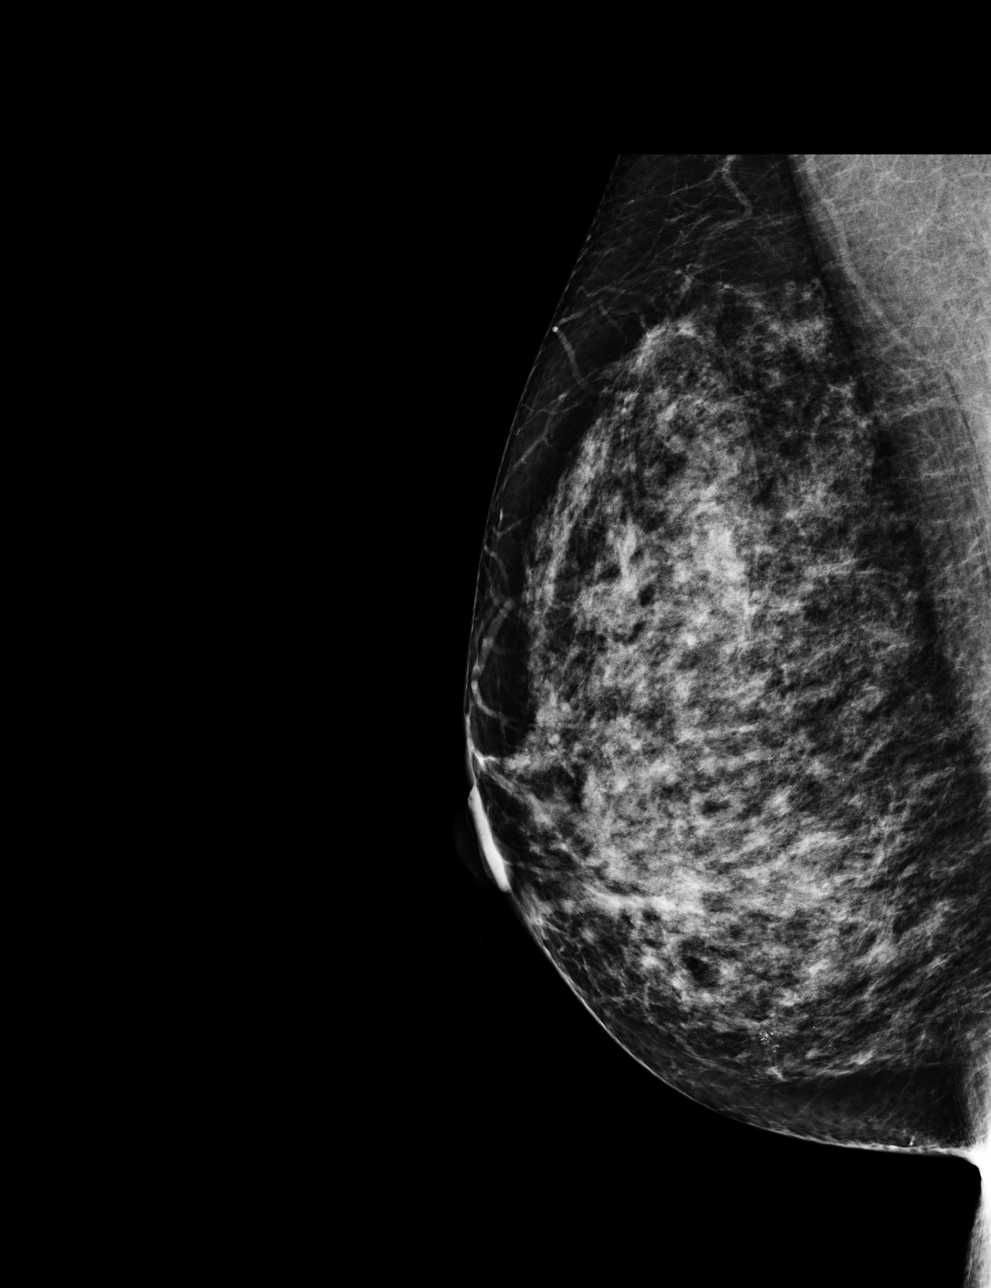

[4 of 4 positions shown; findings below may reference images not displayed]

FINDINGS: ACR Breast Density Category d:  The breasts are extremely dense,
which lowers the sensitivity of mammography.

There are no findings suspicious for malignancy.

Images were processed with CAD.
IMPRESSION: No mammographic evidence of malignancy.

A result letter of this screening mammogram will be mailed directly
to the patient.

RECOMMENDATION:
Screening mammogram in one year. (Code:[P4])

BI-RADS CATEGORY 1:  Negative.

## 2014-04-19 ENCOUNTER — Other Ambulatory Visit: Payer: Self-pay

## 2014-04-19 DIAGNOSIS — Z1231 Encounter for screening mammogram for malignant neoplasm of breast: Secondary | ICD-10-CM

## 2014-08-30 ENCOUNTER — Ambulatory Visit
Admission: RE | Admit: 2014-08-30 | Discharge: 2014-08-30 | Disposition: A | Discharge status: Home / Self Care | Payer: BC Managed Care – PPO | Source: Ambulatory Visit

## 2014-08-30 ENCOUNTER — Encounter (INDEPENDENT_AMBULATORY_CARE_PROVIDER_SITE_OTHER): Payer: Self-pay

## 2014-08-30 DIAGNOSIS — Z1231 Encounter for screening mammogram for malignant neoplasm of breast: Secondary | ICD-10-CM

## 2014-08-31 ENCOUNTER — Other Ambulatory Visit: Payer: Self-pay | Admitting: Family Medicine

## 2014-08-31 DIAGNOSIS — R928 Other abnormal and inconclusive findings on diagnostic imaging of breast: Secondary | ICD-10-CM

## 2014-09-08 ENCOUNTER — Ambulatory Visit
Admission: RE | Admit: 2014-09-08 | Discharge: 2014-09-08 | Disposition: A | Discharge status: Home / Self Care | Payer: BC Managed Care – PPO | Source: Ambulatory Visit | Attending: Family Medicine | Admitting: Family Medicine

## 2014-09-08 DIAGNOSIS — R928 Other abnormal and inconclusive findings on diagnostic imaging of breast: Secondary | ICD-10-CM

## 2015-11-14 ENCOUNTER — Other Ambulatory Visit: Payer: Self-pay | Admitting: Family Medicine

## 2015-11-14 DIAGNOSIS — R921 Mammographic calcification found on diagnostic imaging of breast: Secondary | ICD-10-CM

## 2016-04-29 ENCOUNTER — Other Ambulatory Visit: Payer: Self-pay

## 2016-04-29 ENCOUNTER — Other Ambulatory Visit: Payer: Self-pay | Admitting: Nurse Practitioner

## 2016-04-29 DIAGNOSIS — N644 Mastodynia: Secondary | ICD-10-CM

## 2016-04-29 DIAGNOSIS — R921 Mammographic calcification found on diagnostic imaging of breast: Secondary | ICD-10-CM

## 2016-05-03 ENCOUNTER — Ambulatory Visit
Admission: RE | Admit: 2016-05-03 | Discharge: 2016-05-03 | Disposition: A | Discharge status: Home / Self Care | Payer: BLUE CROSS/BLUE SHIELD | Source: Ambulatory Visit | Attending: Nurse Practitioner | Admitting: Nurse Practitioner

## 2016-05-03 ENCOUNTER — Ambulatory Visit
Admission: RE | Admit: 2016-05-03 | Discharge: 2016-05-03 | Disposition: A | Discharge status: Home / Self Care | Payer: BLUE CROSS/BLUE SHIELD | Source: Ambulatory Visit

## 2016-05-03 DIAGNOSIS — R921 Mammographic calcification found on diagnostic imaging of breast: Secondary | ICD-10-CM

## 2017-07-14 ENCOUNTER — Other Ambulatory Visit: Payer: Self-pay | Admitting: Nurse Practitioner

## 2017-07-14 DIAGNOSIS — Z1231 Encounter for screening mammogram for malignant neoplasm of breast: Secondary | ICD-10-CM

## 2017-07-24 ENCOUNTER — Ambulatory Visit: Payer: BLUE CROSS/BLUE SHIELD

## 2017-07-31 ENCOUNTER — Encounter: Payer: Self-pay | Admitting: Radiology

## 2017-07-31 ENCOUNTER — Ambulatory Visit
Admission: RE | Admit: 2017-07-31 | Discharge: 2017-07-31 | Disposition: A | Discharge status: Home / Self Care | Payer: BLUE CROSS/BLUE SHIELD | Source: Ambulatory Visit | Attending: Nurse Practitioner | Admitting: Nurse Practitioner

## 2017-07-31 DIAGNOSIS — Z1231 Encounter for screening mammogram for malignant neoplasm of breast: Secondary | ICD-10-CM

## 2018-03-10 ENCOUNTER — Encounter: Payer: Self-pay | Admitting: Nurse Practitioner

## 2018-05-01 ENCOUNTER — Other Ambulatory Visit: Payer: Self-pay

## 2018-05-01 ENCOUNTER — Emergency Department (HOSPITAL_COMMUNITY): Payer: BLUE CROSS/BLUE SHIELD

## 2018-05-01 ENCOUNTER — Emergency Department (HOSPITAL_COMMUNITY)
Admission: EM | Admit: 2018-05-01 | Discharge: 2018-05-01 | Disposition: A | Discharge status: Home / Self Care | Payer: BLUE CROSS/BLUE SHIELD | Attending: Emergency Medicine | Admitting: Emergency Medicine

## 2018-05-01 ENCOUNTER — Encounter (HOSPITAL_COMMUNITY): Payer: Self-pay | Admitting: Emergency Medicine

## 2018-05-01 DIAGNOSIS — S99912A Unspecified injury of left ankle, initial encounter: Secondary | ICD-10-CM | POA: Diagnosis present

## 2018-05-01 DIAGNOSIS — S92345A Nondisplaced fracture of fourth metatarsal bone, left foot, initial encounter for closed fracture: Secondary | ICD-10-CM

## 2018-05-01 DIAGNOSIS — Y9389 Activity, other specified: Secondary | ICD-10-CM | POA: Diagnosis not present

## 2018-05-01 DIAGNOSIS — S82852A Displaced trimalleolar fracture of left lower leg, initial encounter for closed fracture: Secondary | ICD-10-CM

## 2018-05-01 DIAGNOSIS — T148XXA Other injury of unspecified body region, initial encounter: Secondary | ICD-10-CM

## 2018-05-01 DIAGNOSIS — W109XXA Fall (on) (from) unspecified stairs and steps, initial encounter: Secondary | ICD-10-CM | POA: Insufficient documentation

## 2018-05-01 DIAGNOSIS — S82855A Nondisplaced trimalleolar fracture of left lower leg, initial encounter for closed fracture: Secondary | ICD-10-CM | POA: Diagnosis not present

## 2018-05-01 DIAGNOSIS — Y999 Unspecified external cause status: Secondary | ICD-10-CM | POA: Insufficient documentation

## 2018-05-01 DIAGNOSIS — Y929 Unspecified place or not applicable: Secondary | ICD-10-CM | POA: Insufficient documentation

## 2018-05-01 DIAGNOSIS — Z85820 Personal history of malignant melanoma of skin: Secondary | ICD-10-CM | POA: Diagnosis not present

## 2018-05-01 DIAGNOSIS — X58XXXA Exposure to other specified factors, initial encounter: Secondary | ICD-10-CM | POA: Diagnosis not present

## 2018-05-01 DIAGNOSIS — Z79899 Other long term (current) drug therapy: Secondary | ICD-10-CM | POA: Diagnosis not present

## 2018-05-01 DIAGNOSIS — F419 Anxiety disorder, unspecified: Secondary | ICD-10-CM | POA: Diagnosis not present

## 2018-05-01 IMAGING — DX DG ANKLE COMPLETE 3+V*L*
3 series · 3 of 3 positions shown · non-contrast
Comparison: None.

CLINICAL DATA: Left ankle injury today due to a fall down stairs.
Initial encounter.

EXAM:
LEFT ANKLE COMPLETE - 3+ VIEW

[ankle ap]
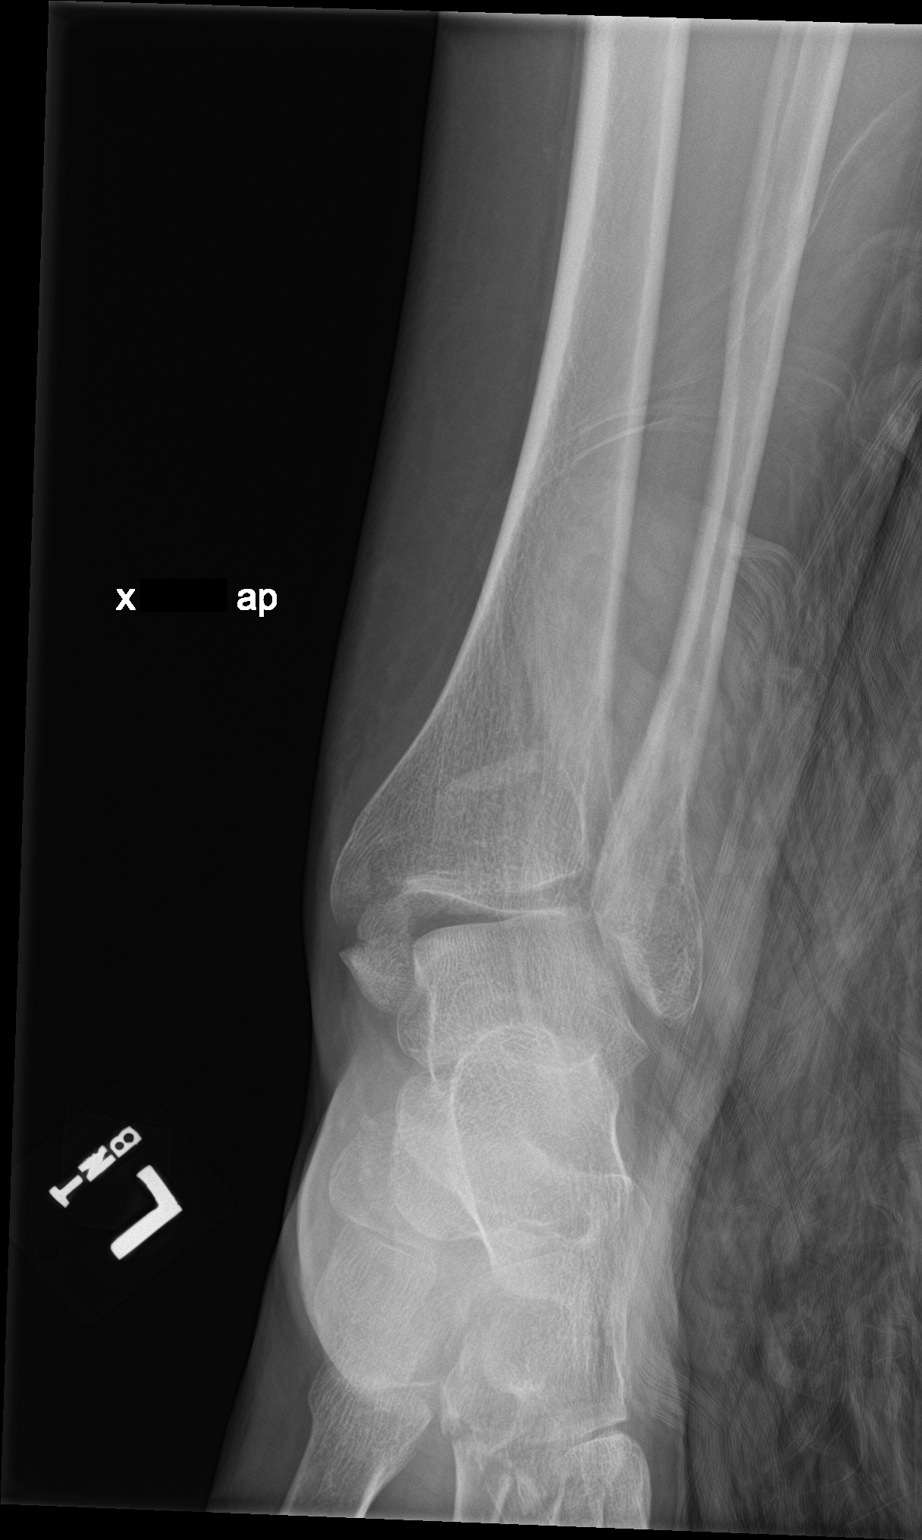

[ankle obl]
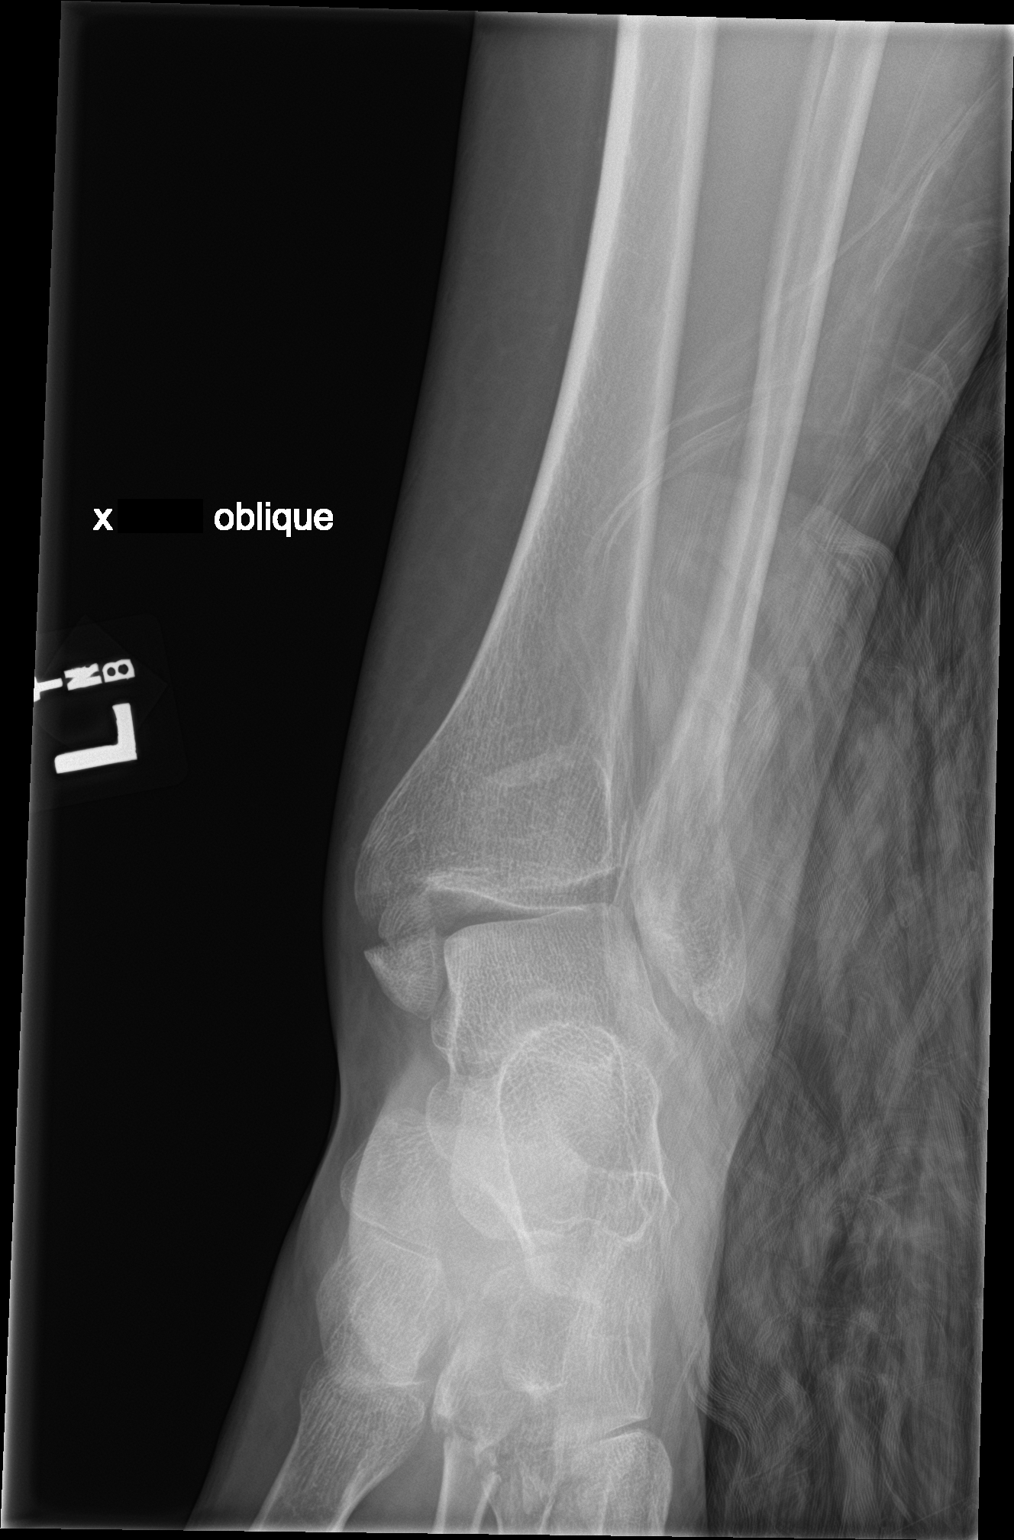

[ankle lat]
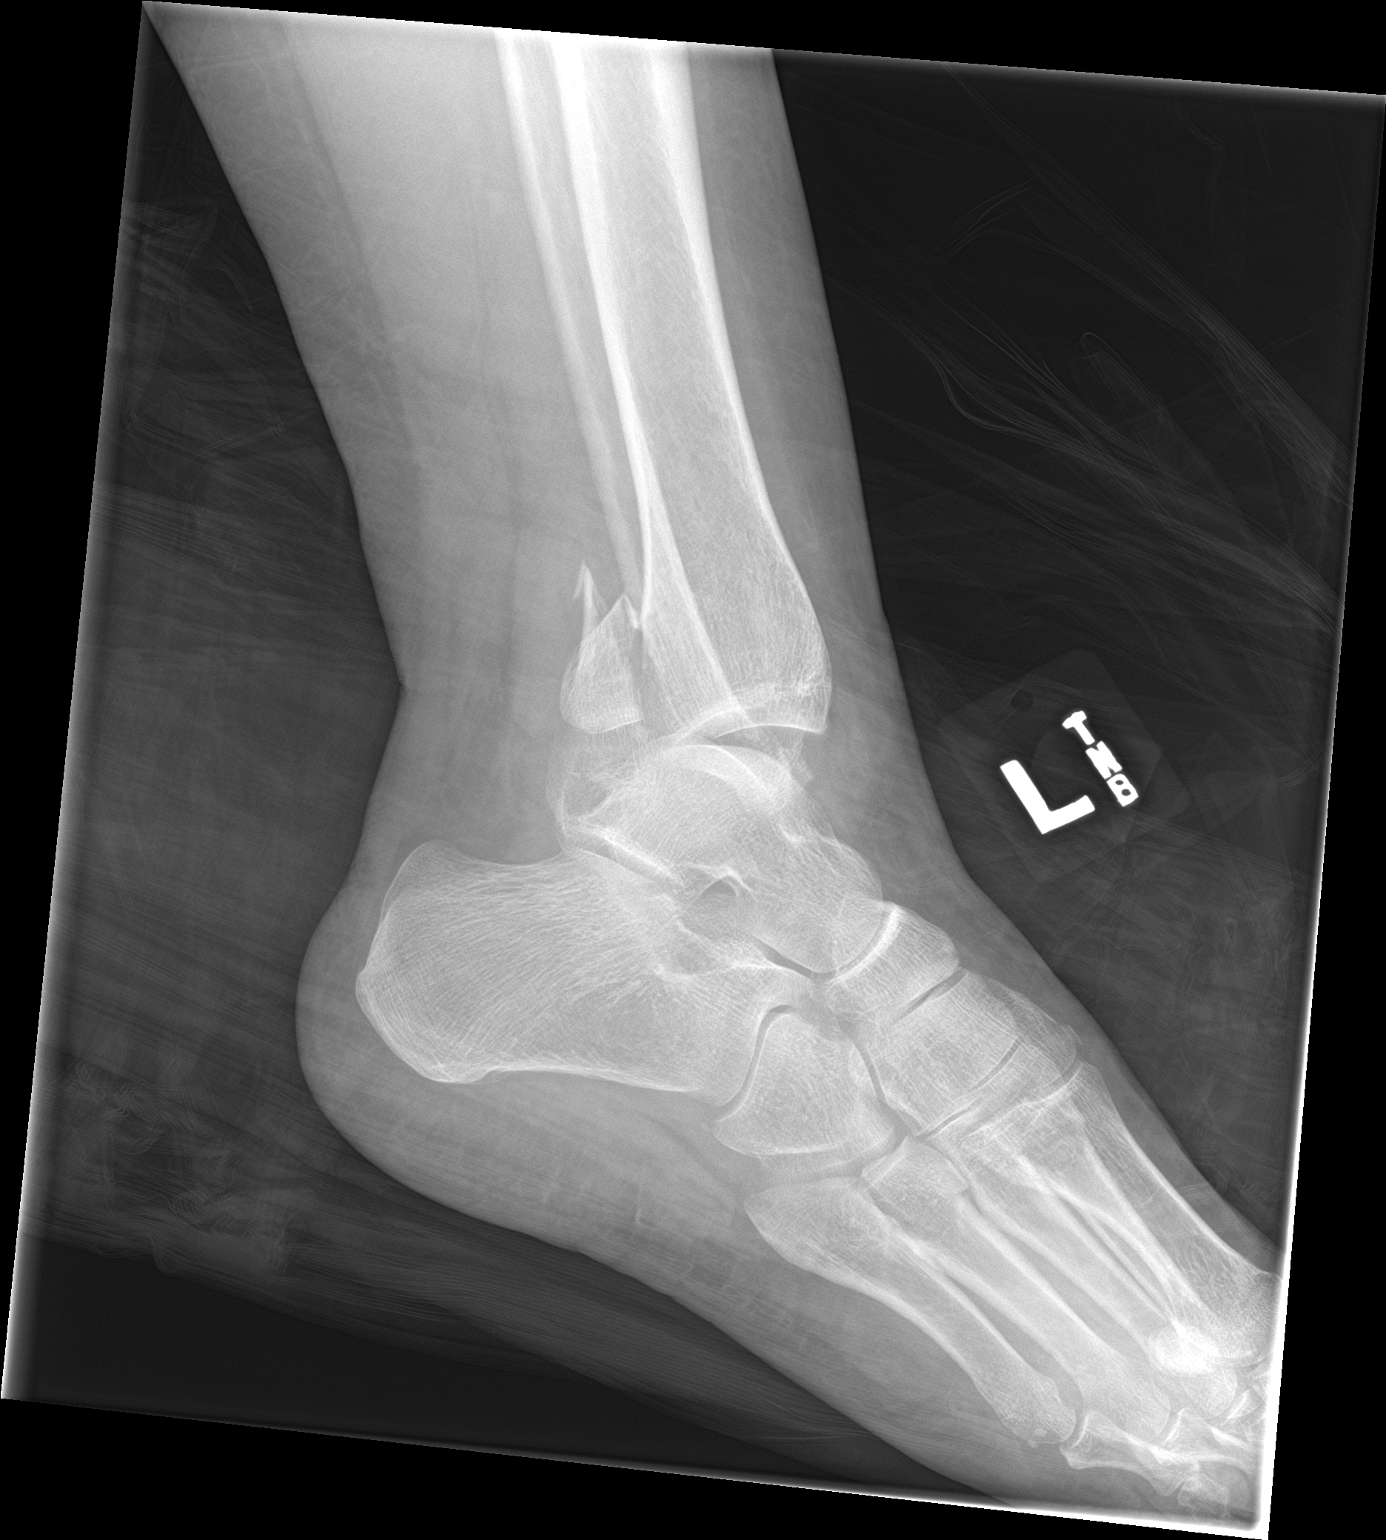

[3 of 3 positions shown; findings below may reference images not displayed]

FINDINGS: The patient has acute posterior and medial malleolar fractures. The
talus is posteriorly subluxed out of the tibiotalar joint. The
medial side of the talus is angled inferiorly. Evaluation of the
fibula is limited by artifact but no obvious fibular fracture is
seen. The patient also has fractures of the bases of the second,
third and fourth metatarsals. Soft tissue swelling is present about
the ankle
IMPRESSION: Acute medial and posterior malleolar fractures with associated
posterior subluxation of the talus out of the tibiotalar joint. No
fibular fracture is identified but visualization is limited.

Acute fractures of the bases of the second, third and fourth
metatarsals. Dedicated plain films of the foot recommended further
evaluation.

## 2018-05-01 IMAGING — CT CT ANKLE*L* W/O CM
3 series · 14 of 33 positions shown, 17 images · non-contrast
Comparison: Radiographs dated [DATE]

CLINICAL DATA: Left ankle fractures due to a fall down stairs
today.

EXAM:
CT OF THE LEFT ANKLE WITHOUT CONTRAST
TECHNIQUE: Multidetector CT imaging of the left ankle was performed according
to the standard protocol. Multiplanar CT image reconstructions were
also generated.

[Series 7: axial st · axial · 0.32mm/px · z∈[-1236,-1119]mm · 6 of 164 slices shown, 8 images]
[im 26/164  soft-tissue]
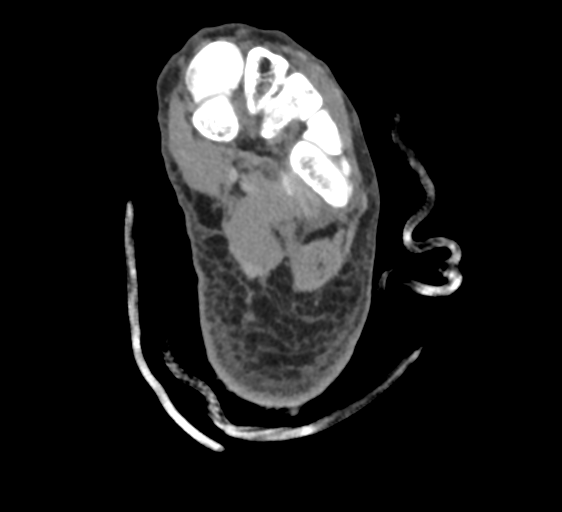
[im 26/164  bone]
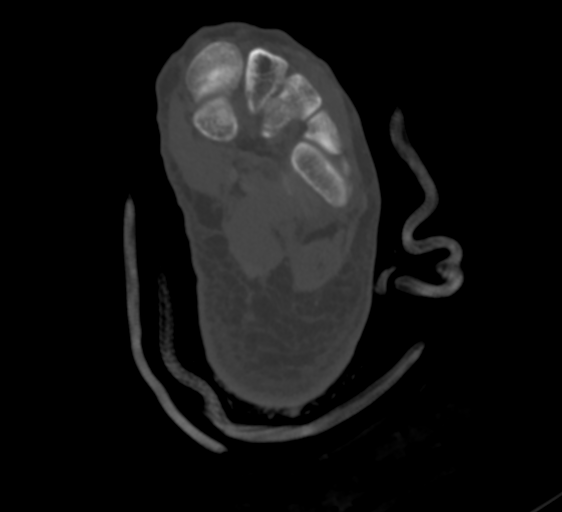
[im 51/164  bone]
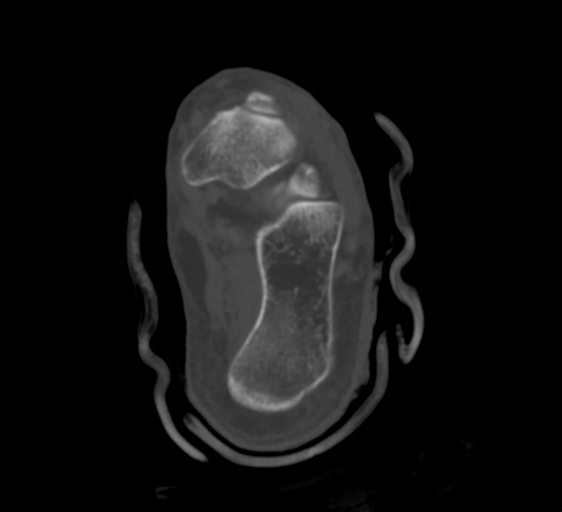
[im 76/164  bone]
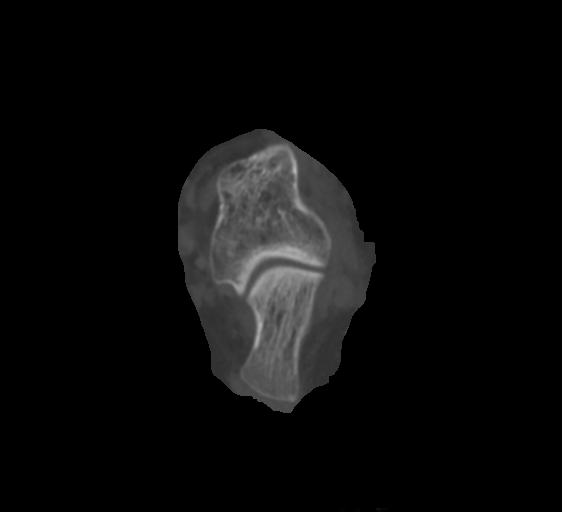
[im 101/164  bone]
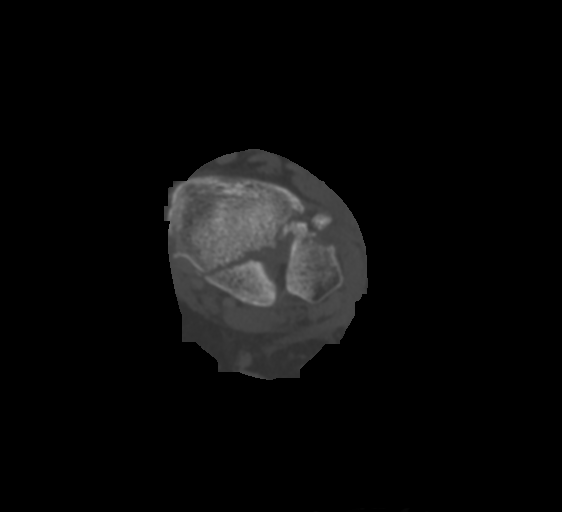
[im 126/164  soft-tissue]
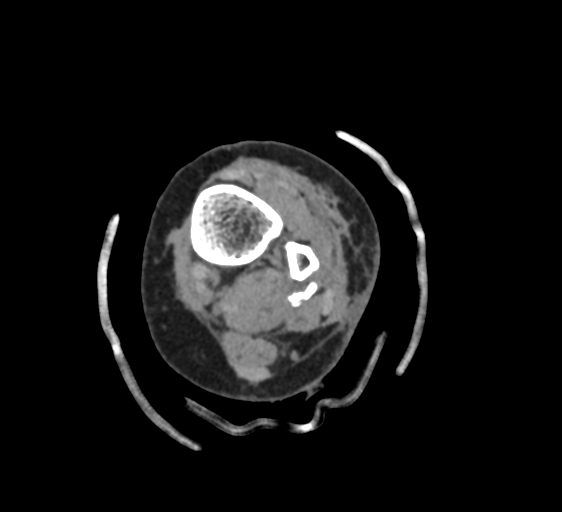
[im 126/164  bone]
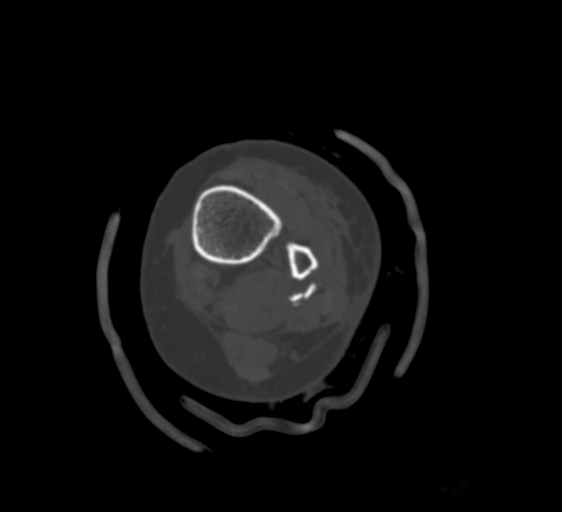
[im 151/164  bone]
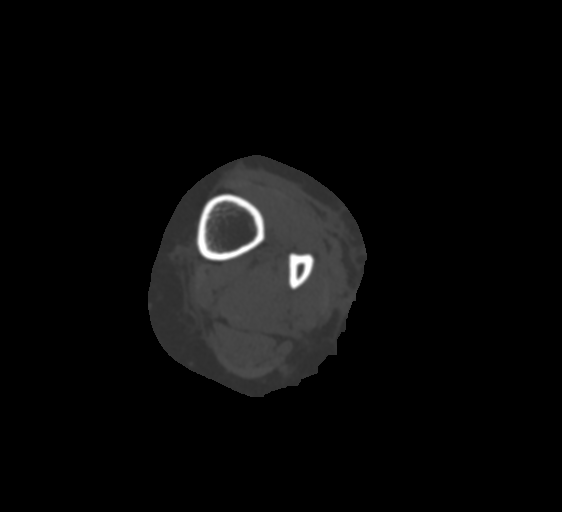

[Series 8: cor st · coronal · 0.30mm/px · 3 of 138 slices shown]
[im 36/138  bone]
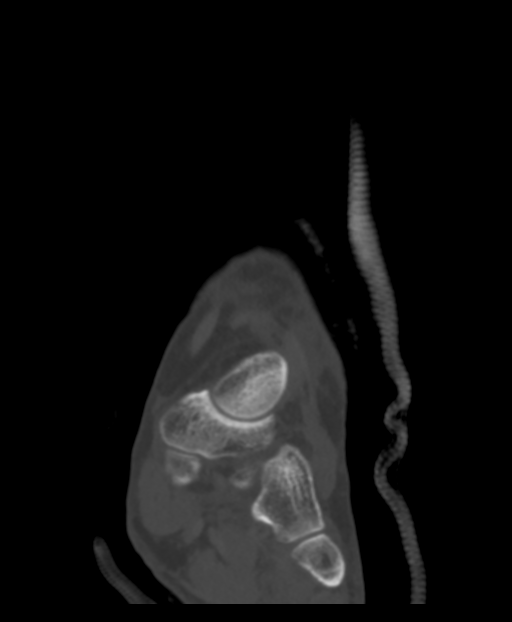
[im 58/138  bone]
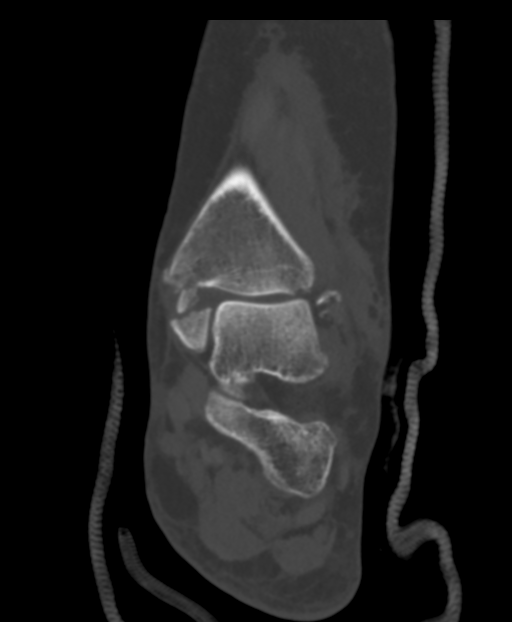
[im 80/138  bone]
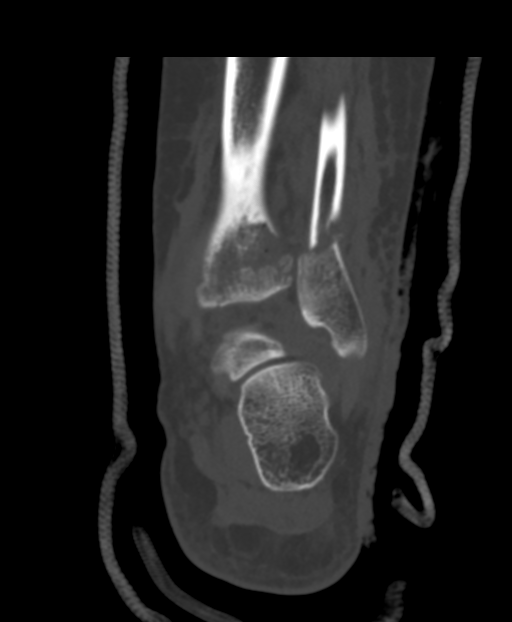

[Series 9: sag st · sagittal · 0.34mm/px · 5 of 117 slices shown, 6 images]
[im 39/117  bone]
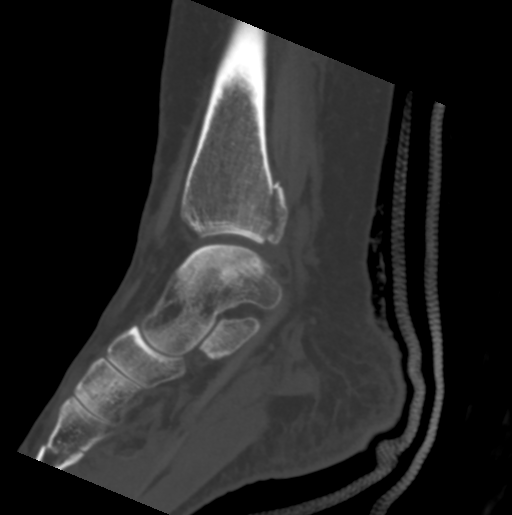
[im 49/117  bone]
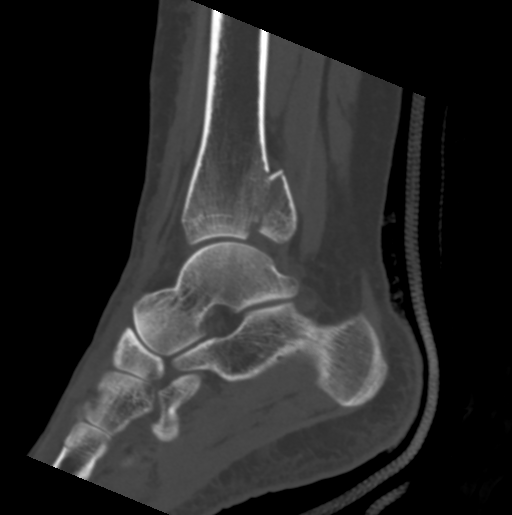
[im 59/117  soft-tissue]
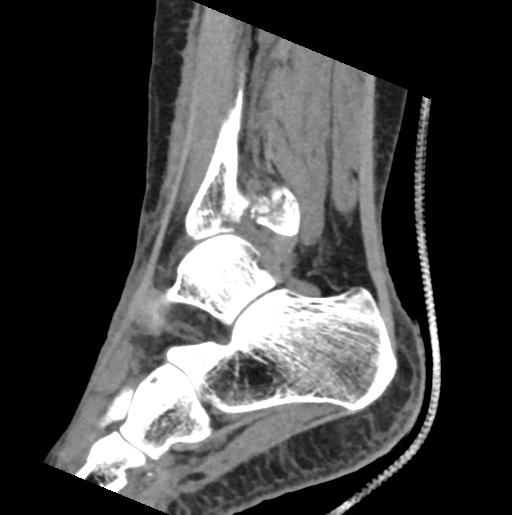
[im 59/117  bone]
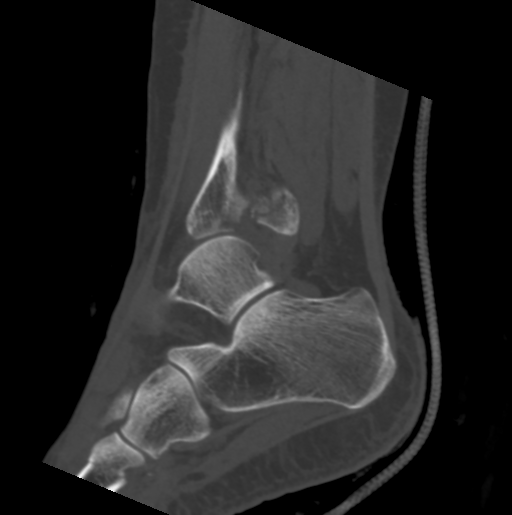
[im 68/117  bone]
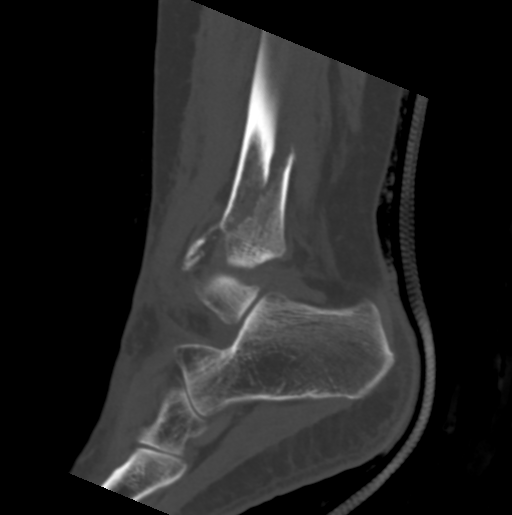
[im 78/117  bone]
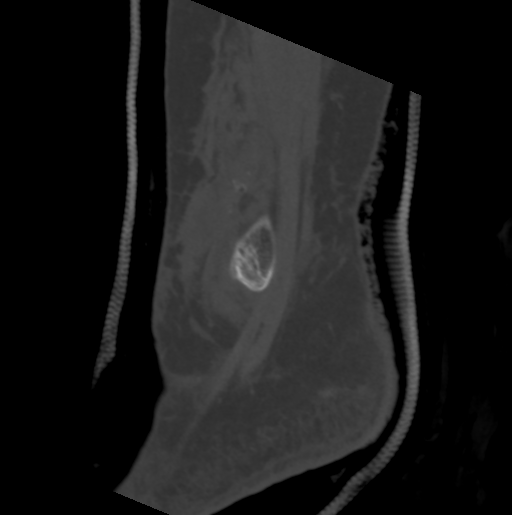

[14 of 33 positions shown; findings below may reference images not displayed]

FINDINGS: Bones/Joint/Cartilage

There is a comminuted slightly displaced slightly overriding
fracture of the distal fibular with multiple displaced fragments of
the anterior aspect of the distal fibula. There is an adjacent small
avulsion of bone from the lateral aspect of the talus consistent
with avulsion of talar attachment of the anterior talofibular
ligament.

There is also a comminuted slightly distracted fracture of the
medial malleolus with a fragment of bone extending into the gap
between the major fragments.

There is also a fracture of the posterior malleolus with slight
distraction. 5 mm step-off articular cortex on images 55 of series
6.

Slight widening of the medial aspect of the tibial talar joint with
slight lateral subluxation of the talus and malleoli with respect to
the distal tibia.

Ligaments

Suboptimally assessed by CT. Probable disruption of the anterior
talofibular ligament.

Muscles and Tendons

Normal.

Soft tissues

Soft tissue edema around the ankle primarily anteriorly and
laterally.
IMPRESSION: Comminuted fractures of the distal tibia and fibula as described.

Probable disruption of the anterior talofibular ligament.

## 2018-05-01 IMAGING — CR DG ANKLE 2V *L*
1 series · 2 of 2 positions shown · non-contrast
Comparison: Earlier today.

CLINICAL DATA: Status post reduction of ankle and foot fractures.

EXAM:
LEFT ANKLE - 2 VIEW

[Series 1: ap · 0.17mm/px · 2 of 2 slices shown]
[im 1/2]
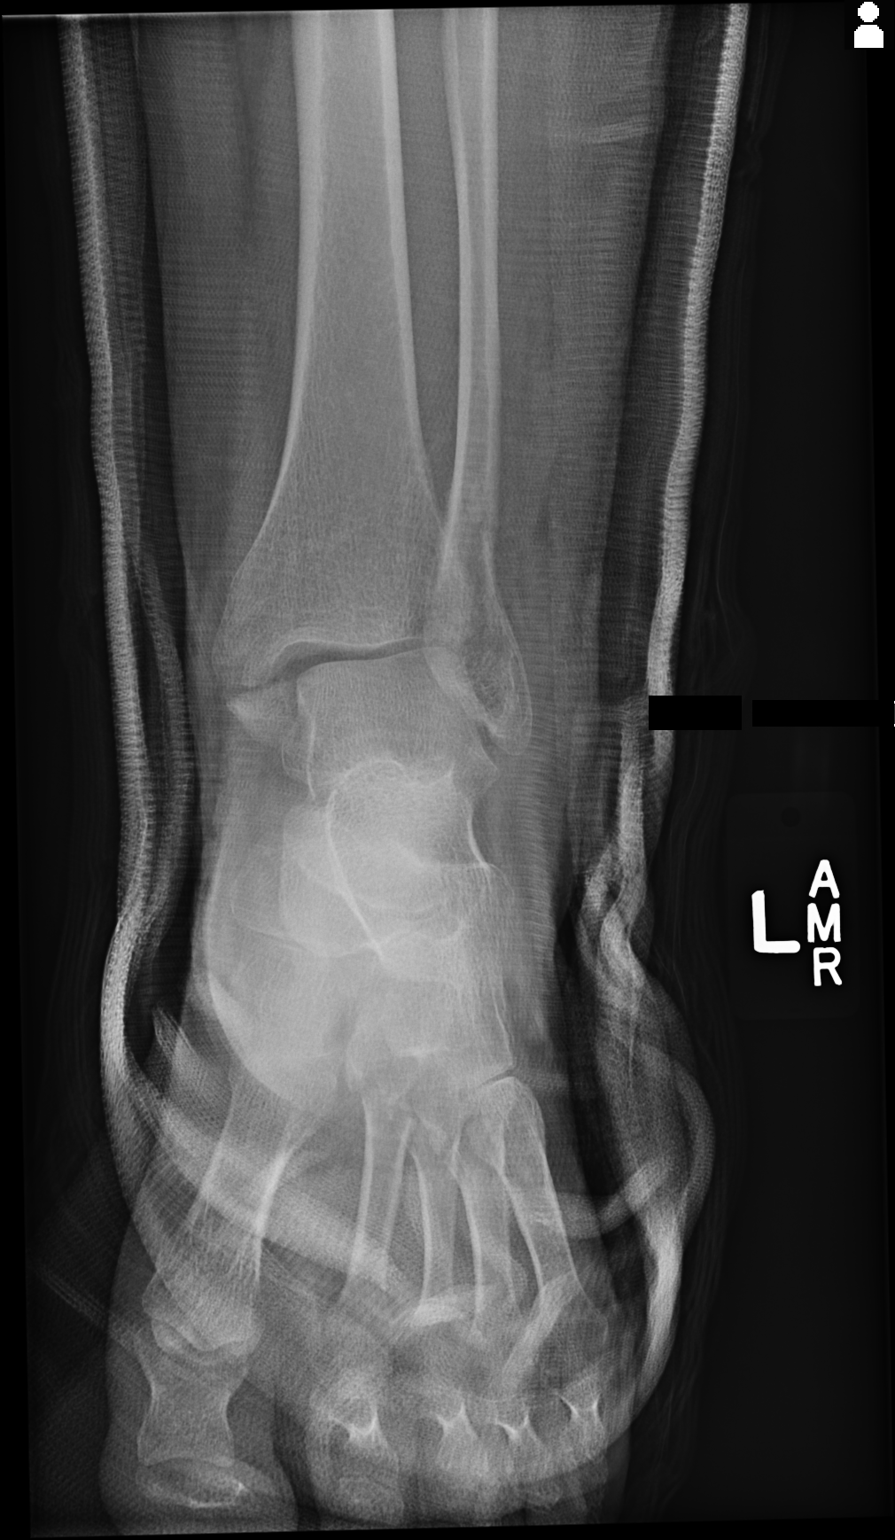
[im 2/2]
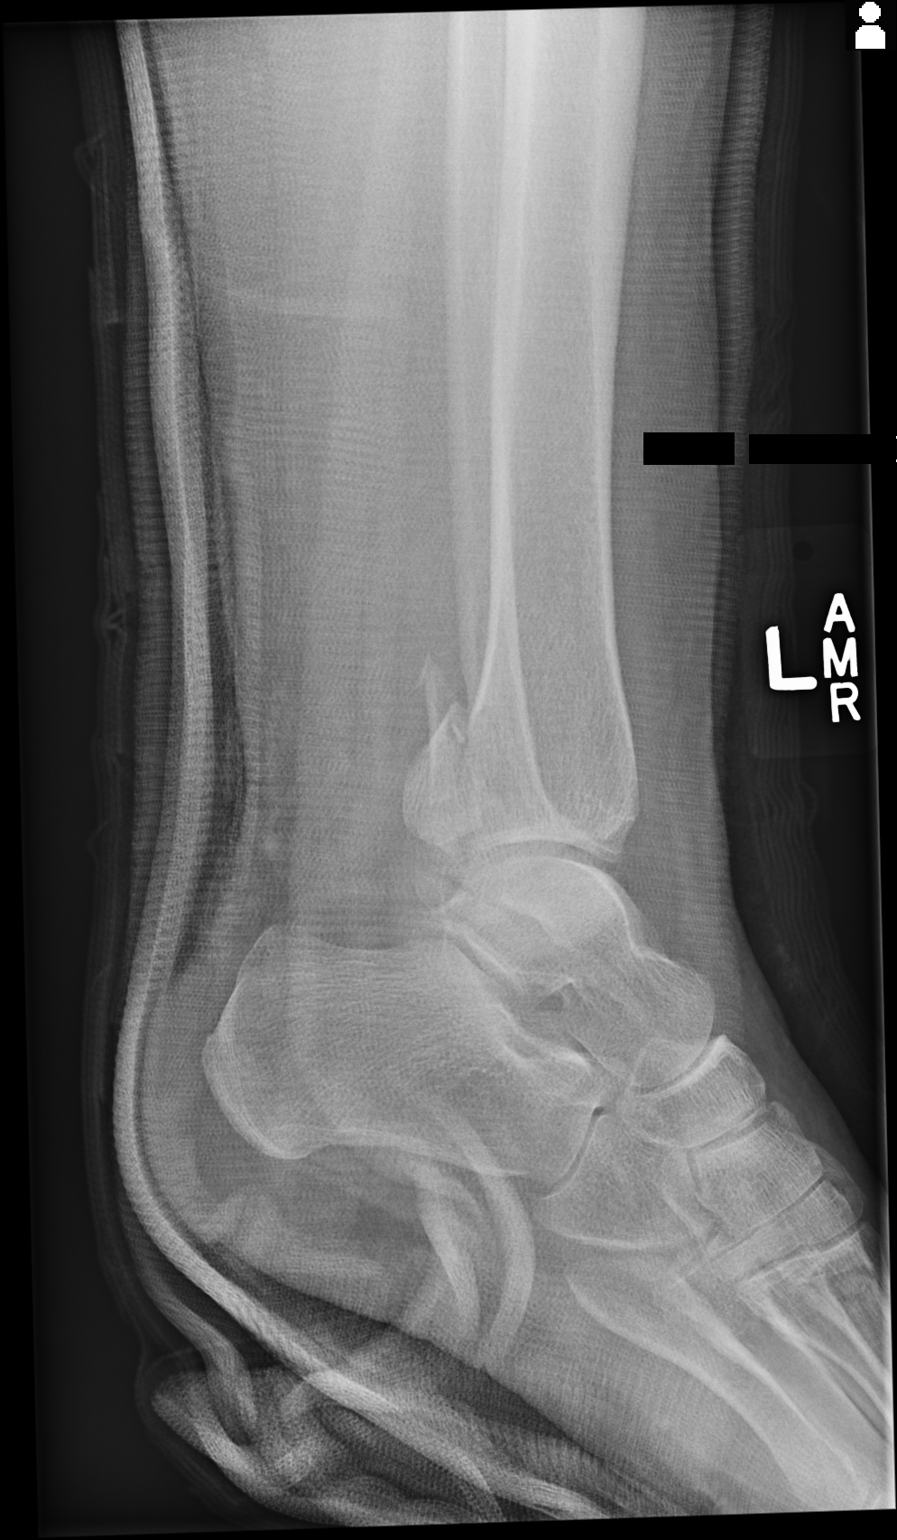

[2 of 2 positions shown; findings below may reference images not displayed]

FINDINGS: And interval fiberglass cast is demonstrated with less distraction
of the fragments of the previously demonstrated medial malleolus
fracture. There is a mild decrease in proximal displacement of the
posterior fragment of the posterior malleolus fracture. Also
demonstrated is a posterolateral malleolus fracture with posterior
and proximal displacement of the posterior fragment. Previously
noted fractures of the bases of the 2nd, 3rd and 4th metatarsals are
again demonstrated without significant change.
IMPRESSION: Trimalleolar fracture and fractures of the bases of the 2nd, 3rd and
4th metatarsals, as described above.

## 2018-05-01 IMAGING — DX DG FOOT COMPLETE 3+V*L*
3 series · 3 of 3 positions shown · non-contrast
Comparison: Ankle radiographs earlier same date.

CLINICAL DATA: Fall today.  Left ankle fractures.

EXAM:
LEFT FOOT - COMPLETE 3+ VIEW

[foot ap]
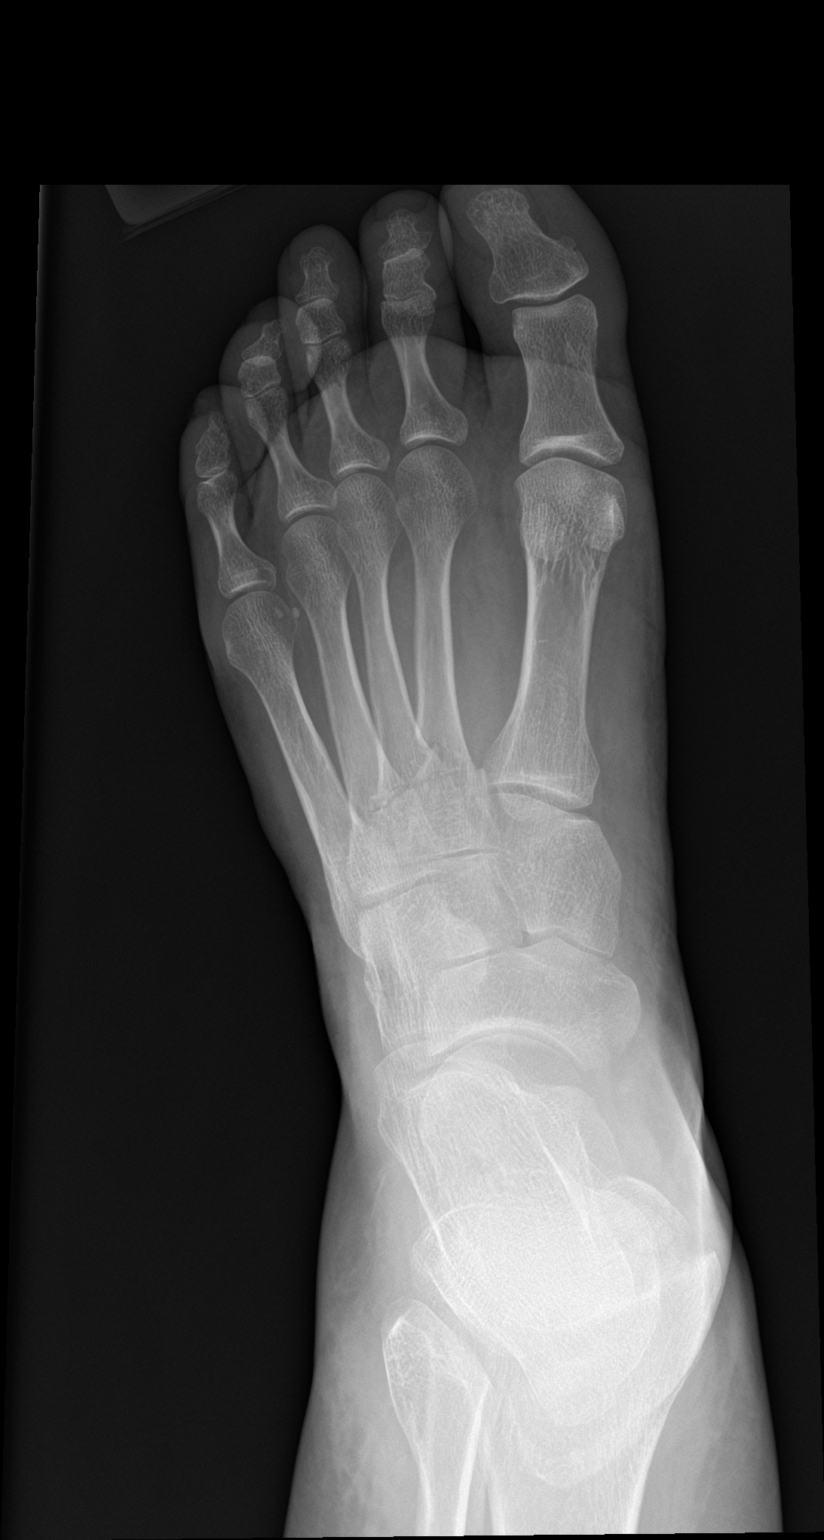

[foot obl]
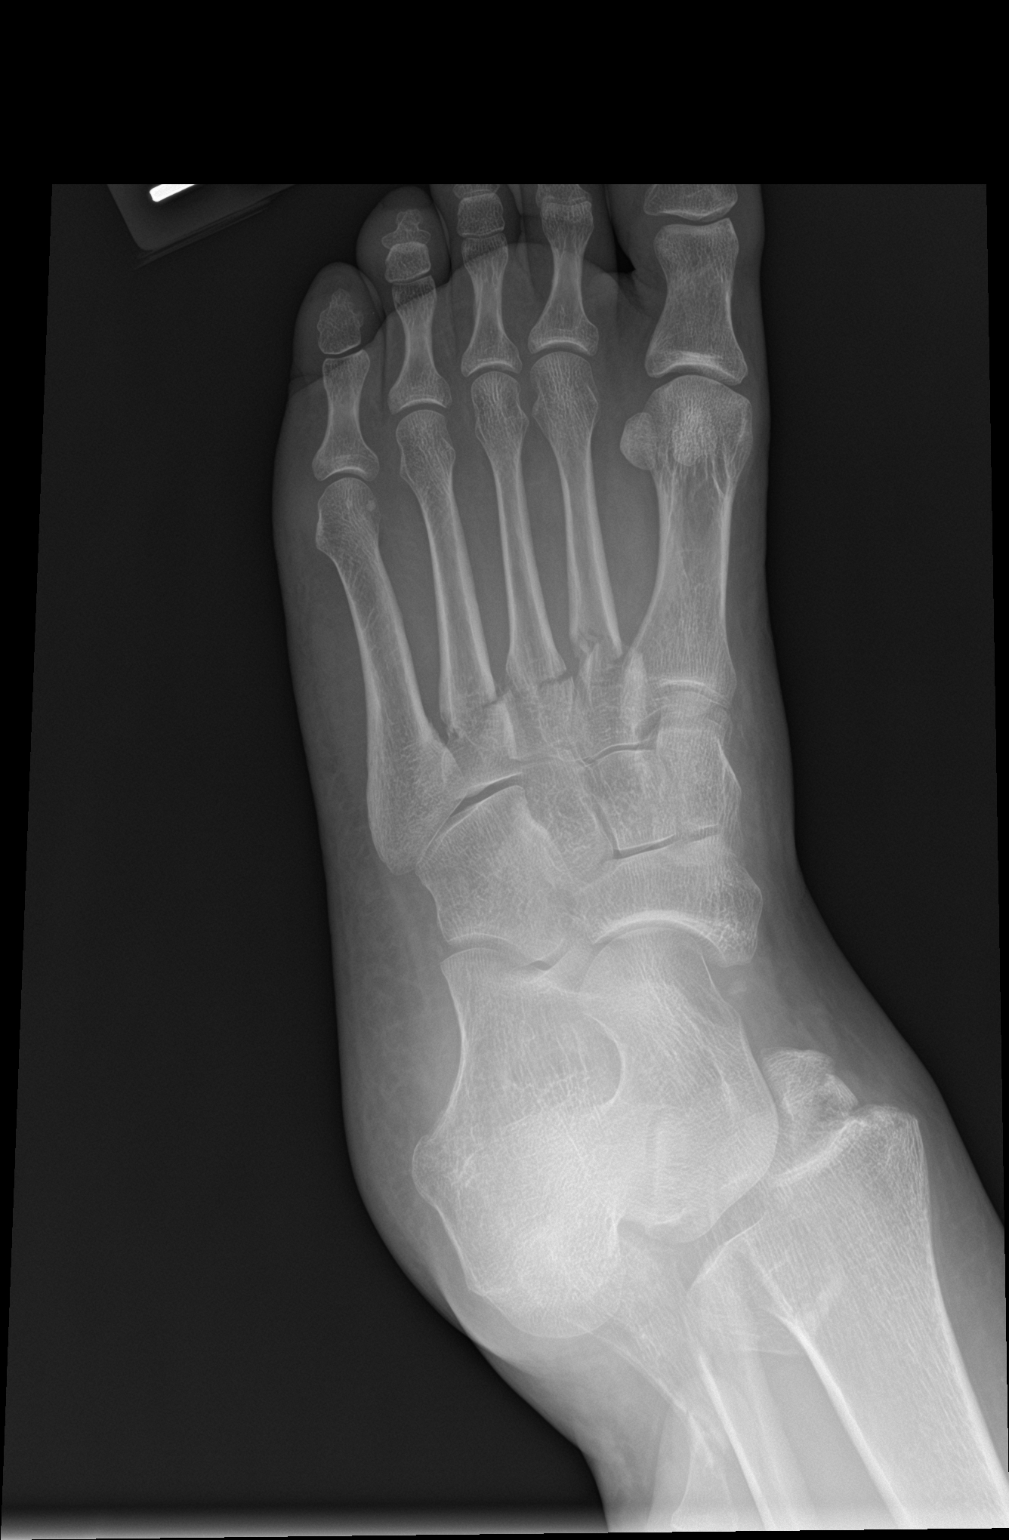

[foot lat]
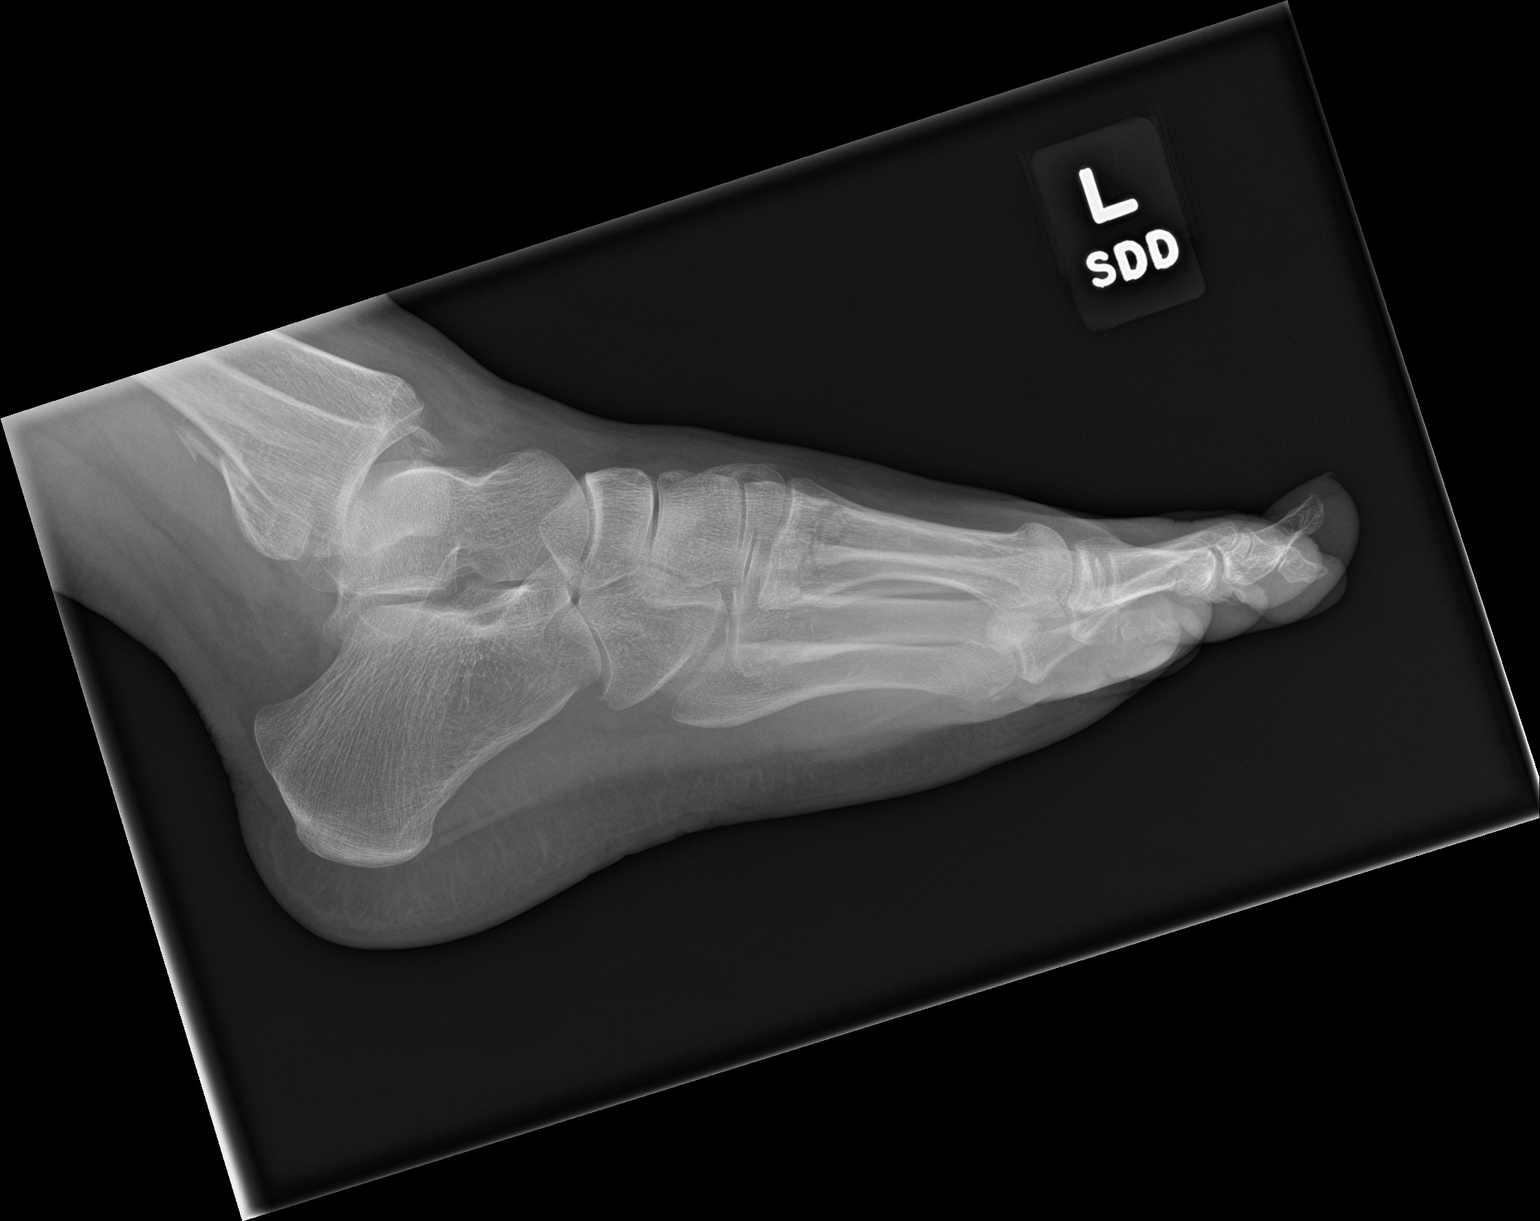

[3 of 3 positions shown; findings below may reference images not displayed]

FINDINGS: There are acute extra-articular fractures involving the bases of the
2nd, 3rd and 4th metatarsals, each associated with mild lateral
displacement. The 1st and 5th metatarsals appear intact. No tarsal
bone fractures are seen. Fractures of the medial and posterior
malleoli of the distal tibia are again noted with associated mild
anterior widening of the tibiotalar joint on the lateral view. On
these views, there is a questionable fracture of the distal fibula
as well.
IMPRESSION: 1. Mildly displaced extra-articular fractures involving the 2nd, 3rd
and 4th metatarsal bases.
2. Probable trimalleolar fracture with tibiotalar joint space
widening anteriorly.

## 2018-05-01 MED ORDER — ONDANSETRON HCL 4 MG/2ML IJ SOLN
4.0000 mg | Freq: Once | INTRAMUSCULAR | Status: AC
  Administered 2018-05-01: 4 mg via INTRAVENOUS
  Filled 2018-05-01: qty 2

## 2018-05-01 MED ORDER — PROPOFOL 10 MG/ML IV BOLUS
40.0000 mg | Freq: Once | INTRAVENOUS | Status: AC
  Administered 2018-05-01: 30 mg via INTRAVENOUS
  Administered 2018-05-01: 20 mg via INTRAVENOUS
  Filled 2018-05-01: qty 20

## 2018-05-01 MED ORDER — OXYCODONE-ACETAMINOPHEN 5-325 MG PO TABS: 2.0000 | ORAL_TABLET | ORAL | 0 refills | Status: DC | PRN

## 2018-05-01 MED ORDER — HYDROMORPHONE HCL 1 MG/ML IJ SOLN
1.0000 mg | Freq: Once | INTRAMUSCULAR | Status: AC
  Administered 2018-05-01: 1 mg via INTRAVENOUS
  Filled 2018-05-01: qty 1

## 2018-05-01 MED ORDER — MIDAZOLAM HCL 2 MG/2ML IJ SOLN
4.0000 mg | Freq: Once | INTRAMUSCULAR | Status: AC
  Administered 2018-05-01: 2 mg via INTRAVENOUS
  Filled 2018-05-01: qty 4

## 2018-05-01 NOTE — Discharge Instructions (Addendum)
Follow with Dr. Erlinda Hong as directed

## 2018-05-01 NOTE — ED Notes (Signed)
Portable x-ray done

## 2018-05-01 NOTE — ED Triage Notes (Signed)
Patient states she fell off a step and twisted her left ankle. Obvious deformity noted to left ankle at triage.

## 2018-05-01 NOTE — ED Provider Notes (Signed)
[facility] EMERGENCY DEPARTMENT Provider Note   CSN: [id number] Arrival date & time: 05/01/18  1224     History   Chief Complaint Chief Complaint  Patient presents with  . Ankle Injury    HPI Sherri Schultz is a 52 y.o. female.  The history is provided by the patient. No language interpreter was used.  Ankle Injury  This is a new problem. The current episode started 1 to 2 hours ago. The problem occurs constantly. The problem has not changed since onset.Nothing aggravates the symptoms. Nothing relieves the symptoms. She has tried nothing for the symptoms. The treatment provided moderate relief.  Pt reports she slipped and fell.  Pt complains of pain in her foot and ankle.    History reviewed. No pertinent past medical history.  There are no active problems to display for this patient.   History reviewed. No pertinent surgical history.   OB History   None      Home Medications    Prior to Admission medications   Not on File    Family History History reviewed. No pertinent family history.  Social History Social History   Tobacco Use  . Smoking status: Never Smoker  . Smokeless tobacco: Never Used  Substance Use Topics  . Alcohol use: Never    Frequency: Never  . Drug use: Never     Allergies   Patient has no known allergies.   Review of Systems Review of Systems  All other systems reviewed and are negative.    Physical Exam Updated Vital Signs BP (!) 135/91 (BP Location: Right Arm)   Pulse 88   Temp 97.9 F (36.6 C) (Oral)   Resp 16   Ht 5' 4.5" (1.638 m)   Wt 67.1 kg (148 lb)   SpO2 100%   BMI 25.01 kg/m   Physical Exam  Constitutional: She appears well-developed and well-nourished.  HENT:  Head: Normocephalic.  Cardiovascular: Normal rate.  Pulmonary/Chest: Effort normal.  Musculoskeletal: She exhibits tenderness and deformity.  Swollen tender left ankle. Pain to touch,  Obvious deformity ankle. nv and ns intact    Neurological: She is alert.  Skin: Skin is warm.  Psychiatric: She has a normal mood and affect.  Nursing note and vitals reviewed.    ED Treatments / Results  Labs (all labs ordered are listed, but only abnormal results are displayed) Labs Reviewed - No data to display  EKG None  Radiology Dg Ankle Complete Left  Result Date: 05/01/2018 CLINICAL DATA:  Left ankle injury today due to a fall down stairs. Initial encounter. EXAM: LEFT ANKLE COMPLETE - 3+ VIEW COMPARISON:  None. FINDINGS: The patient has acute posterior and medial malleolar fractures. The talus is posteriorly subluxed out of the tibiotalar joint. The medial side of the talus is angled inferiorly. Evaluation of the fibula is limited by artifact but no obvious fibular fracture is seen. The patient also has fractures of the bases of the second, third and fourth metatarsals. Soft tissue swelling is present about the ankle IMPRESSION: Acute medial and posterior malleolar fractures with associated posterior subluxation of the talus out of the tibiotalar joint. No fibular fracture is identified but visualization is limited. Acute fractures of the bases of the second, third and fourth metatarsals. Dedicated plain films of the foot recommended further evaluation. Electronically Signed   By: Inge Rise M.D.   On: 05/01/2018 13:14    Procedures Procedures (including critical care time)  Medications Ordered in ED Medications  HYDROmorphone (  DILAUDID) injection 1 mg (1 mg Intravenous Given 05/01/18 1338)  ondansetron (ZOFRAN) injection 4 mg (4 mg Intravenous Given 05/01/18 1338)     Initial Impression / Assessment and Plan / ED Course  I have reviewed the triage vital signs and the nursing notes.  Pertinent labs & imaging results that were available during my care of the patient were reviewed by me and considered in my medical decision making (see chart for details).  Clinical Course as of May 02 1431  Fri May 01, 2018  1424  DG Foot Complete Left [BM]    Clinical Course User Index [BM] Noemi Chapel, MD    MDM  Dr. Sabra Heck in to see, sedation and reduction by Dr. Sabra Heck,  Post reduction xray shows improved alignment. I spoke to Dr. Erlinda Hong.  He request ct.  He reports pt will need surgery.  He contacted pt to advise while she is in ED.  Pt splinted by Dr. Sabra Heck   Final Clinical Impressions(s) / ED Diagnoses   Final diagnoses:  Closed trimalleolar fracture of left ankle, initial encounter  Closed nondisplaced fracture of fourth metatarsal bone of left foot, initial encounter    ED Discharge Orders        Ordered    oxyCODONE-acetaminophen (PERCOCET/ROXICET) 5-325 MG tablet  Every 4 hours PRN     05/01/18 1604    An After Visit Summary was printed and given to the patient.   Sidney Ace 05/01/18 1605    Noemi Chapel, MD 05/02/18 2033

## 2018-05-04 ENCOUNTER — Encounter (HOSPITAL_BASED_OUTPATIENT_CLINIC_OR_DEPARTMENT_OTHER): Payer: Self-pay | Admitting: *Deleted

## 2018-05-04 ENCOUNTER — Other Ambulatory Visit: Payer: Self-pay

## 2018-05-05 ENCOUNTER — Encounter (INDEPENDENT_AMBULATORY_CARE_PROVIDER_SITE_OTHER): Payer: Self-pay | Admitting: Orthopaedic Surgery

## 2018-05-05 ENCOUNTER — Ambulatory Visit (INDEPENDENT_AMBULATORY_CARE_PROVIDER_SITE_OTHER): Payer: BLUE CROSS/BLUE SHIELD | Admitting: Orthopaedic Surgery

## 2018-05-05 DIAGNOSIS — S82852A Displaced trimalleolar fracture of left lower leg, initial encounter for closed fracture: Secondary | ICD-10-CM

## 2018-05-05 NOTE — Progress Notes (Signed)
   Office Visit Note   Patient: Sherri Schultz           Date of Birth: 01/22/1966           MRN: [id number] Visit Date: 05/05/2018              Requested by: Vicenta Aly, [place],  [id number] PCP: Vicenta Aly, FNP   Assessment & Plan: Visit Diagnoses:  1. Closed displaced trimalleolar fracture of left ankle, initial encounter     Plan: Impression is 52 year old female with displaced trimalleolar ankle fracture dislocation.  Recommendation is for surgical fixation.  Details of the surgery were discussed with the patient along with the risks and benefits.  They understand and wish to proceed.  We will plan on doing this tomorrow.  Expected postoperative recovery was discussed with the patient and husband.  Follow-Up Instructions: Return in about 2 weeks (around 05/19/2018).   Orders:  No orders of the defined types were placed in this encounter.  No orders of the defined types were placed in this encounter.     Procedures: No procedures performed   Clinical Data: No additional findings.   Subjective: Chief Complaint  Patient presents with  . Left Ankle - Pain, Follow-up    [name] is a very pleasant 52 year old female comes in for an acute trimalleolar ankle fracture that was reduced in the ED about 4 days ago.  She had a mechanical fall at the time.  She comes in today for a soft tissue check.  She is scheduled for surgery tomorrow.   Review of Systems  Constitutional: Negative.   HENT: Negative.   Eyes: Negative.   Respiratory: Negative.   Cardiovascular: Negative.   Endocrine: Negative.   Musculoskeletal: Negative.   Neurological: Negative.   Hematological: Negative.   Psychiatric/Behavioral: Negative.   All other systems reviewed and are negative.    Objective: Vital Signs: LMP 03/26/2016   Physical Exam  Constitutional: She is oriented to person, place, and time. She appears well-developed and  well-nourished.  HENT:  Head: Normocephalic and atraumatic.  Eyes: EOM are normal.  Neck: Neck supple.  Pulmonary/Chest: Effort normal.  Abdominal: Soft.  Neurological: She is alert and oriented to person, place, and time.  Skin: Skin is warm. Capillary refill takes less than 2 seconds.  Psychiatric: She has a normal mood and affect. Her behavior is normal. Judgment and thought content normal.  Nursing note and vitals reviewed.   Ortho Exam Left ankle exam shows mild swelling.  The skin does wrinkle.  Foot is neurovascular intact. Specialty Comments:  No specialty comments available.  Imaging: No results found.   PMFS History: There are no active problems to display for this patient.  Past Medical History:  Diagnosis Date  . Anxiety   . Cancer [facility]) 2013   right knee  . Trimalleolar fracture of ankle, closed, left, initial encounter     History reviewed. No pertinent family history.  Past Surgical History:  Procedure Laterality Date  . 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 Not on file  Tobacco Use  . Smoking status: Never Smoker  . Smokeless tobacco: Never Used  Substance and Sexual Activity  . Alcohol use: Never    Frequency: Never  . Drug use: Never  . Sexual activity: Not on file

## 2018-05-06 ENCOUNTER — Encounter (HOSPITAL_BASED_OUTPATIENT_CLINIC_OR_DEPARTMENT_OTHER)
Admission: RE | Disposition: A | Discharge status: Home / Self Care | Payer: Self-pay | Source: Ambulatory Visit | Attending: Orthopaedic Surgery

## 2018-05-06 ENCOUNTER — Ambulatory Visit (HOSPITAL_BASED_OUTPATIENT_CLINIC_OR_DEPARTMENT_OTHER): Payer: BLUE CROSS/BLUE SHIELD | Admitting: Anesthesiology

## 2018-05-06 ENCOUNTER — Encounter (HOSPITAL_BASED_OUTPATIENT_CLINIC_OR_DEPARTMENT_OTHER): Payer: Self-pay | Admitting: Anesthesiology

## 2018-05-06 ENCOUNTER — Ambulatory Visit (HOSPITAL_BASED_OUTPATIENT_CLINIC_OR_DEPARTMENT_OTHER)
Admission: RE | Admit: 2018-05-06 | Discharge: 2018-05-06 | Disposition: A | Discharge status: Home / Self Care | Payer: BLUE CROSS/BLUE SHIELD | Source: Ambulatory Visit | Attending: Orthopaedic Surgery | Admitting: Orthopaedic Surgery

## 2018-05-06 ENCOUNTER — Other Ambulatory Visit: Payer: Self-pay

## 2018-05-06 ENCOUNTER — Ambulatory Visit (HOSPITAL_COMMUNITY): Payer: BLUE CROSS/BLUE SHIELD

## 2018-05-06 DIAGNOSIS — S82852A Displaced trimalleolar fracture of left lower leg, initial encounter for closed fracture: Secondary | ICD-10-CM | POA: Diagnosis not present

## 2018-05-06 DIAGNOSIS — Z79899 Other long term (current) drug therapy: Secondary | ICD-10-CM | POA: Insufficient documentation

## 2018-05-06 DIAGNOSIS — F419 Anxiety disorder, unspecified: Secondary | ICD-10-CM | POA: Insufficient documentation

## 2018-05-06 DIAGNOSIS — X58XXXA Exposure to other specified factors, initial encounter: Secondary | ICD-10-CM | POA: Insufficient documentation

## 2018-05-06 DIAGNOSIS — Z85820 Personal history of malignant melanoma of skin: Secondary | ICD-10-CM | POA: Insufficient documentation

## 2018-05-06 DIAGNOSIS — Z4789 Encounter for other orthopedic aftercare: Secondary | ICD-10-CM

## 2018-05-06 HISTORY — DX: Displaced trimalleolar fracture of left lower leg, initial encounter for closed fracture: S82.852A

## 2018-05-06 HISTORY — DX: Anxiety disorder, unspecified: F41.9

## 2018-05-06 HISTORY — PX: ORIF ANKLE FRACTURE: SHX5408

## 2018-05-06 HISTORY — DX: Malignant (primary) neoplasm, unspecified: C80.1

## 2018-05-06 IMAGING — RF DG ANKLE COMPLETE 3+V*L*
1 series · 4 of 4 positions shown · non-contrast
Comparison: None.

CLINICAL DATA: Left ankle fracture

EXAM:
DG C-ARM 61-120 MIN; LEFT ANKLE COMPLETE - 3+ VIEW

[Series 1: run · 4 of 4 slices shown]
[im 1/4]
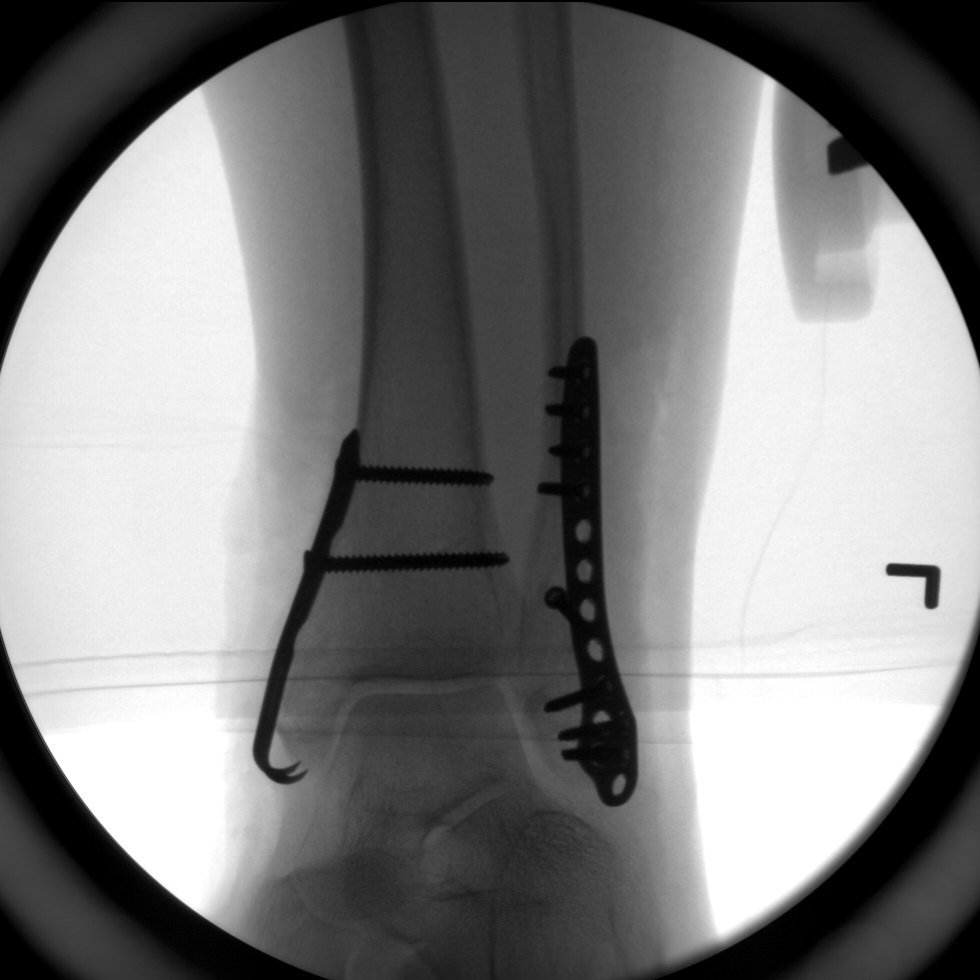
[im 2/4]
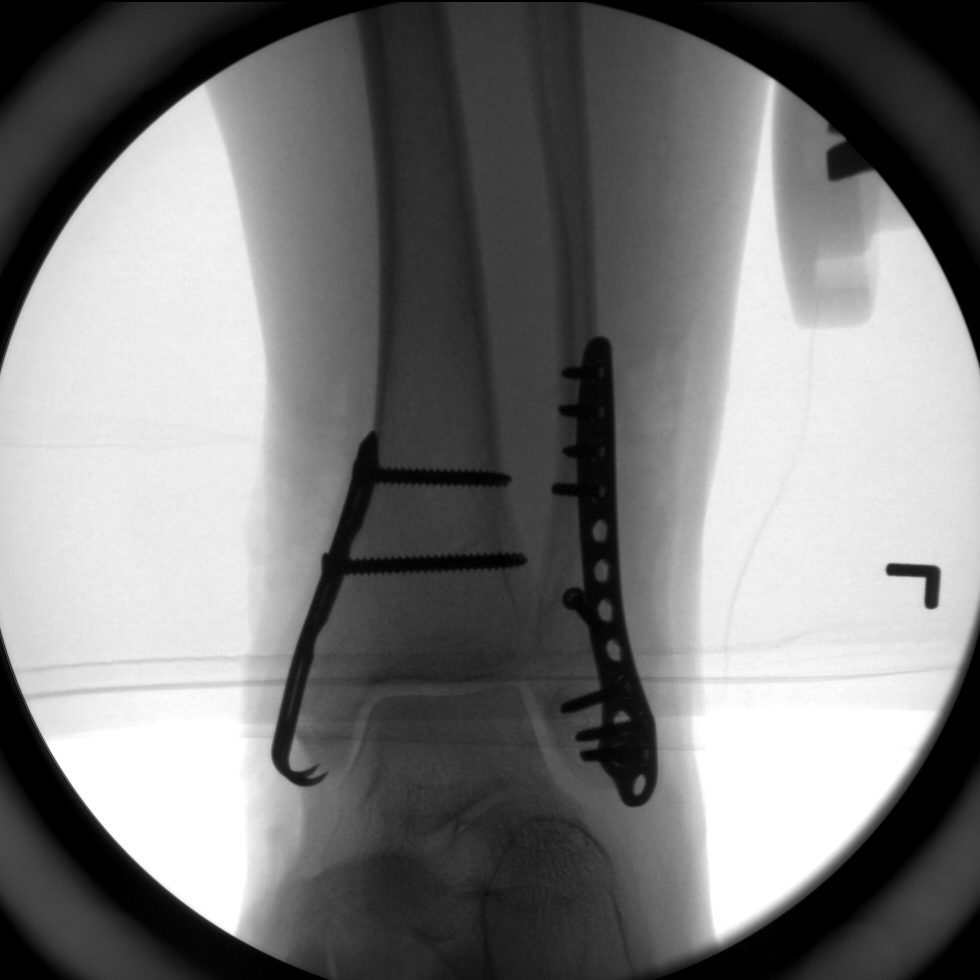
[im 3/4]
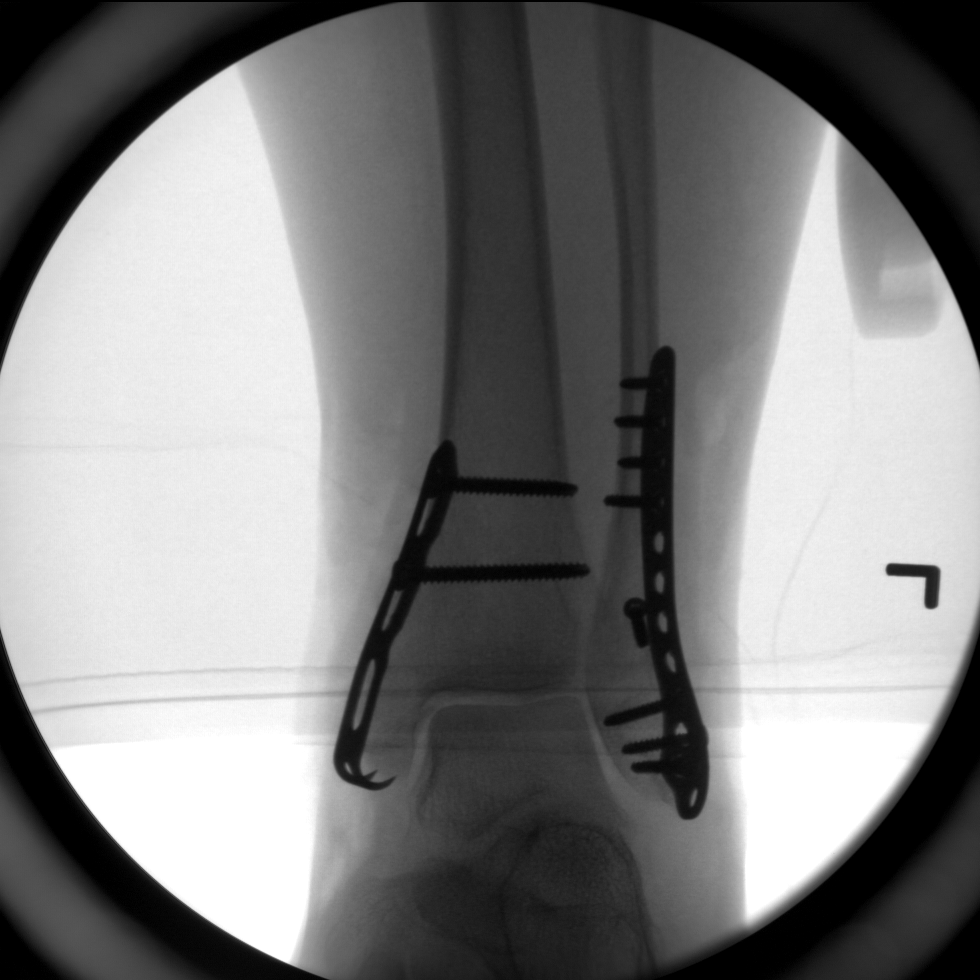
[im 4/4]
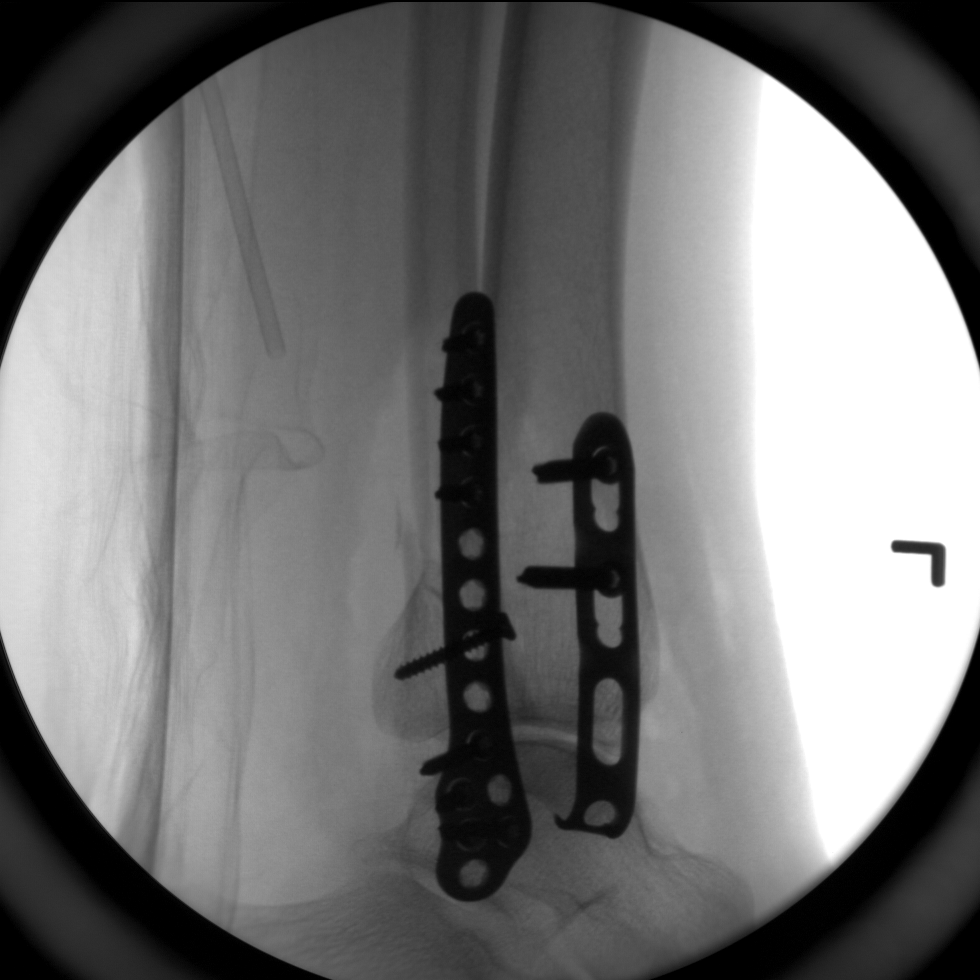

[4 of 4 positions shown; findings below may reference images not displayed]

FLUOROSCOPY TIME:  Fluoroscopy Time:  35 seconds

Radiation Exposure Index (if provided by the fluoroscopic device):
Not available

Number of Acquired Spot Images: 4
FINDINGS: Fixation side plates are noted along the distal tibia and fibula.
Multiple fixation screws are seen. Fracture fragments are in near
anatomic alignment.
IMPRESSION: ORIF of distal tibial and fibular fractures.

## 2018-05-06 SURGERY — OPEN REDUCTION INTERNAL FIXATION (ORIF) ANKLE FRACTURE
Anesthesia: General | Site: Ankle | Laterality: Left

## 2018-05-06 MED ORDER — FENTANYL CITRATE (PF) 100 MCG/2ML IJ SOLN: 25.0000 ug | INTRAMUSCULAR | Status: DC | PRN

## 2018-05-06 MED ORDER — PROPOFOL 10 MG/ML IV BOLUS
INTRAVENOUS | Status: DC | PRN
  Administered 2018-05-06: 160 mg via INTRAVENOUS

## 2018-05-06 MED ORDER — FENTANYL CITRATE (PF) 100 MCG/2ML IJ SOLN
50.0000 ug | INTRAMUSCULAR | Status: DC | PRN
  Administered 2018-05-06: 100 ug via INTRAVENOUS

## 2018-05-06 MED ORDER — CHLORHEXIDINE GLUCONATE 4 % EX LIQD: 60.0000 mL | Freq: Once | CUTANEOUS | Status: DC

## 2018-05-06 MED ORDER — DEXAMETHASONE SODIUM PHOSPHATE 4 MG/ML IJ SOLN
INTRAMUSCULAR | Status: DC | PRN
  Administered 2018-05-06: 10 mg via INTRAVENOUS

## 2018-05-06 MED ORDER — DEXAMETHASONE SODIUM PHOSPHATE 10 MG/ML IJ SOLN
INTRAMUSCULAR | Status: AC
  Filled 2018-05-06: qty 1

## 2018-05-06 MED ORDER — LIDOCAINE HCL (CARDIAC) PF 100 MG/5ML IV SOSY
PREFILLED_SYRINGE | INTRAVENOUS | Status: AC
  Filled 2018-05-06: qty 5

## 2018-05-06 MED ORDER — CEFAZOLIN SODIUM-DEXTROSE 2-4 GM/100ML-% IV SOLN
2.0000 g | INTRAVENOUS | Status: AC
  Administered 2018-05-06: 2 g via INTRAVENOUS

## 2018-05-06 MED ORDER — ONDANSETRON HCL 4 MG PO TABS: 4.0000 mg | ORAL_TABLET | Freq: Three times a day (TID) | ORAL | 0 refills | Status: DC | PRN

## 2018-05-06 MED ORDER — PROPOFOL 500 MG/50ML IV EMUL
INTRAVENOUS | Status: AC
  Filled 2018-05-06: qty 100

## 2018-05-06 MED ORDER — PROMETHAZINE HCL 25 MG PO TABS: 25.0000 mg | ORAL_TABLET | Freq: Four times a day (QID) | ORAL | 1 refills | Status: DC | PRN

## 2018-05-06 MED ORDER — ONDANSETRON HCL 4 MG/2ML IJ SOLN
INTRAMUSCULAR | Status: DC | PRN
  Administered 2018-05-06: 4 mg via INTRAVENOUS

## 2018-05-06 MED ORDER — CALCIUM CARBONATE-VITAMIN D 500-200 MG-UNIT PO TABS: 1.0000 | ORAL_TABLET | Freq: Three times a day (TID) | ORAL | 12 refills | Status: DC

## 2018-05-06 MED ORDER — PROMETHAZINE HCL 25 MG/ML IJ SOLN: 6.2500 mg | INTRAMUSCULAR | Status: DC | PRN

## 2018-05-06 MED ORDER — LACTATED RINGERS IV SOLN
INTRAVENOUS | Status: DC
  Administered 2018-05-06: 09:00:00 via INTRAVENOUS

## 2018-05-06 MED ORDER — SCOPOLAMINE 1 MG/3DAYS TD PT72: 1.0000 | MEDICATED_PATCH | Freq: Once | TRANSDERMAL | Status: DC | PRN

## 2018-05-06 MED ORDER — MIDAZOLAM HCL 2 MG/2ML IJ SOLN
1.0000 mg | INTRAMUSCULAR | Status: DC | PRN
  Administered 2018-05-06: 2 mg via INTRAVENOUS

## 2018-05-06 MED ORDER — LACTATED RINGERS IV SOLN
INTRAVENOUS | Status: DC
  Administered 2018-05-06: 08:00:00 via INTRAVENOUS

## 2018-05-06 MED ORDER — ZINC SULFATE 220 (50 ZN) MG PO CAPS: 220.0000 mg | ORAL_CAPSULE | Freq: Every day | ORAL | 0 refills | Status: DC

## 2018-05-06 MED ORDER — CEFAZOLIN SODIUM-DEXTROSE 2-4 GM/100ML-% IV SOLN
INTRAVENOUS | Status: AC
  Filled 2018-05-06: qty 100

## 2018-05-06 MED ORDER — ASPIRIN EC 81 MG PO TBEC: 81.0000 mg | DELAYED_RELEASE_TABLET | Freq: Two times a day (BID) | ORAL | 0 refills | Status: DC

## 2018-05-06 MED ORDER — BUPIVACAINE-EPINEPHRINE (PF) 0.5% -1:200000 IJ SOLN
INTRAMUSCULAR | Status: DC | PRN
  Administered 2018-05-06: 30 mL via PERINEURAL

## 2018-05-06 MED ORDER — FENTANYL CITRATE (PF) 100 MCG/2ML IJ SOLN
INTRAMUSCULAR | Status: AC
  Filled 2018-05-06: qty 2

## 2018-05-06 MED ORDER — ARTIFICIAL TEARS OPHTHALMIC OINT
TOPICAL_OINTMENT | OPHTHALMIC | Status: AC
  Filled 2018-05-06: qty 3.5

## 2018-05-06 MED ORDER — OXYCODONE-ACETAMINOPHEN 5-325 MG PO TABS: 1.0000 | ORAL_TABLET | ORAL | 0 refills | Status: DC | PRN

## 2018-05-06 MED ORDER — LIDOCAINE 2% (20 MG/ML) 5 ML SYRINGE
INTRAMUSCULAR | Status: DC | PRN
  Administered 2018-05-06: 50 mg via INTRAVENOUS

## 2018-05-06 MED ORDER — ONDANSETRON HCL 4 MG/2ML IJ SOLN
INTRAMUSCULAR | Status: AC
  Filled 2018-05-06: qty 2

## 2018-05-06 MED ORDER — MIDAZOLAM HCL 2 MG/2ML IJ SOLN
INTRAMUSCULAR | Status: AC
  Filled 2018-05-06: qty 2

## 2018-05-06 SURGICAL SUPPLY — 98 items
BANDAGE ACE 4X5 VEL STRL LF (GAUZE/BANDAGES/DRESSINGS) ×2 IMPLANT
BANDAGE ACE 6X5 VEL STRL LF (GAUZE/BANDAGES/DRESSINGS) ×3 IMPLANT
BANDAGE ESMARK 6X9 LF (GAUZE/BANDAGES/DRESSINGS) ×1 IMPLANT
BIT DRILL 2.7MM OVERBIT QC (BIT) IMPLANT
BIT DRILL QC 2.0 SHORT EVOS SM (DRILL) IMPLANT
BIT DRILL QC 2.5MM SHRT EVO SM (DRILL) IMPLANT
BLADE HEX COATED 2.75 (ELECTRODE) ×3 IMPLANT
BLADE SURG 15 STRL LF DISP TIS (BLADE) ×2 IMPLANT
BLADE SURG 15 STRL SS (BLADE) ×6
BNDG CMPR 9X6 STRL LF SNTH (GAUZE/BANDAGES/DRESSINGS) ×1
BNDG COHESIVE 6X5 TAN STRL LF (GAUZE/BANDAGES/DRESSINGS) ×3 IMPLANT
BNDG ESMARK 6X9 LF (GAUZE/BANDAGES/DRESSINGS) ×3
BRUSH SCRUB EZ PLAIN DRY (MISCELLANEOUS) ×3 IMPLANT
CANISTER SUCT 1200ML W/VALVE (MISCELLANEOUS) ×3 IMPLANT
COVER BACK TABLE 60X90IN (DRAPES) ×3 IMPLANT
COVER MAYO STAND STRL (DRAPES) IMPLANT
CUFF TOURNIQUET SINGLE 34IN LL (TOURNIQUET CUFF) ×2 IMPLANT
DECANTER SPIKE VIAL GLASS SM (MISCELLANEOUS) IMPLANT
DRAPE C-ARM 42X72 X-RAY (DRAPES) ×3 IMPLANT
DRAPE C-ARMOR (DRAPES) ×3 IMPLANT
DRAPE EXTREMITY T 121X128X90 (DRAPE) ×3 IMPLANT
DRAPE IMP U-DRAPE 54X76 (DRAPES) ×3 IMPLANT
DRAPE SURG 17X23 STRL (DRAPES) ×6 IMPLANT
DRILL 2.7MM OVERDRILL QC (BIT) ×3
DRILL QC 2.0 SHORT EVOS SM (DRILL) ×3
DRILL QC 2.5MM SHORT EVOS SM (DRILL) ×3
DRSG PAD ABDOMINAL 8X10 ST (GAUZE/BANDAGES/DRESSINGS) ×6 IMPLANT
DURAPREP 26ML APPLICATOR (WOUND CARE) ×6 IMPLANT
ELECT REM PT RETURN 9FT ADLT (ELECTROSURGICAL) ×3
ELECTRODE REM PT RTRN 9FT ADLT (ELECTROSURGICAL) ×1 IMPLANT
GAUZE SPONGE 4X4 12PLY STRL (GAUZE/BANDAGES/DRESSINGS) ×3 IMPLANT
GAUZE XEROFORM 1X8 LF (GAUZE/BANDAGES/DRESSINGS) ×3 IMPLANT
GLOVE BIOGEL PI IND STRL 7.0 (GLOVE) ×1 IMPLANT
GLOVE BIOGEL PI INDICATOR 7.0 (GLOVE) ×6
GLOVE ECLIPSE 7.0 STRL STRAW (GLOVE) ×5 IMPLANT
GLOVE SKINSENSE NS SZ7.5 (GLOVE) ×2
GLOVE SKINSENSE STRL SZ7.5 (GLOVE) ×1 IMPLANT
GLOVE SURG SYN 7.5  E (GLOVE) ×2
GLOVE SURG SYN 7.5 E (GLOVE) ×1 IMPLANT
GLOVE SURG SYN 7.5 PF PI (GLOVE) ×1 IMPLANT
GOWN STRL REIN XL XLG (GOWN DISPOSABLE) ×3 IMPLANT
GOWN STRL REUS W/ TWL LRG LVL3 (GOWN DISPOSABLE) ×1 IMPLANT
GOWN STRL REUS W/ TWL XL LVL3 (GOWN DISPOSABLE) ×1 IMPLANT
GOWN STRL REUS W/TWL LRG LVL3 (GOWN DISPOSABLE) ×3
GOWN STRL REUS W/TWL XL LVL3 (GOWN DISPOSABLE) ×3
GUIDE PIN 1.3 (PIN) ×6
K-WIRE 1.25 (WIRE) ×3
MANIFOLD NEPTUNE II (INSTRUMENTS) ×3 IMPLANT
NEEDLE HYPO 22GX1.5 SAFETY (NEEDLE) IMPLANT
NS IRRIG 1000ML POUR BTL (IV SOLUTION) ×3 IMPLANT
PACK BASIN DAY SURGERY FS (CUSTOM PROCEDURE TRAY) ×3 IMPLANT
PAD CAST 3X4 CTTN HI CHSV (CAST SUPPLIES) IMPLANT
PAD CAST 4YDX4 CTTN HI CHSV (CAST SUPPLIES) IMPLANT
PADDING CAST COTTON 3X4 STRL (CAST SUPPLIES)
PADDING CAST COTTON 4X4 STRL (CAST SUPPLIES)
PADDING CAST COTTON 6X4 STRL (CAST SUPPLIES) IMPLANT
PADDING CAST SYN 6 (CAST SUPPLIES) ×2
PADDING CAST SYNTHETIC 4 (CAST SUPPLIES) ×2
PADDING CAST SYNTHETIC 4X4 STR (CAST SUPPLIES) ×1 IMPLANT
PADDING CAST SYNTHETIC 6X4 NS (CAST SUPPLIES) ×1 IMPLANT
PENCIL BUTTON HOLSTER BLD 10FT (ELECTRODE) ×3 IMPLANT
PIN GUIDE 1.3 (PIN) IMPLANT
PLATE 2.7X8H L FIBULA (Plate) ×2 IMPLANT
PLATE 3 HOLE LCP HOOK 3.5MM (Plate) ×2 IMPLANT
SCREW CORT 2.7X10 STAR T8 EVOS (Screw) ×2 IMPLANT
SCREW CORT 2.7X16 STAR T8 EVOS (Screw) ×2 IMPLANT
SCREW CORT 2.7X20 T8 ST EVOS (Screw) ×2 IMPLANT
SCREW CORTEX 2.7X11 (Screw) ×2 IMPLANT
SCREW CORTEX 2.7X9 (Screw) ×4 IMPLANT
SCREW CORTEX 3.5X26 (Screw) ×2 IMPLANT
SCREW CORTICAL 2.7 X 16MM T8 (Screw) ×2 IMPLANT
SCREW CTX 3.5X34MM EVOS (Screw) ×2 IMPLANT
SCREW LOCK 2.7X8 (Screw) ×3 IMPLANT
SCREW LOCK EVOS T8 ST 2.7X13 (Screw) ×2 IMPLANT
SCREW LOCK T8 8X2.7XST (Screw) IMPLANT
SHEET MEDIUM DRAPE 40X70 STRL (DRAPES) ×3 IMPLANT
SLEEVE SCD COMPRESS KNEE MED (MISCELLANEOUS) ×3 IMPLANT
SPLINT FIBERGLASS 4X30 (CAST SUPPLIES) ×2 IMPLANT
SPONGE LAP 18X18 RF (DISPOSABLE) ×3 IMPLANT
SUCTION FRAZIER HANDLE 10FR (MISCELLANEOUS) ×2
SUCTION TUBE FRAZIER 10FR DISP (MISCELLANEOUS) ×1 IMPLANT
SUT ETHILON 3 0 PS 1 (SUTURE) ×6 IMPLANT
SUT VIC AB 0 CT1 27 (SUTURE) ×3
SUT VIC AB 0 CT1 27XBRD ANBCTR (SUTURE) IMPLANT
SUT VIC AB 2-0 CT1 27 (SUTURE) ×3
SUT VIC AB 2-0 CT1 TAPERPNT 27 (SUTURE) ×1 IMPLANT
SUT VIC AB 3-0 SH 27 (SUTURE)
SUT VIC AB 3-0 SH 27X BRD (SUTURE) IMPLANT
SYR BULB 3OZ (MISCELLANEOUS) ×3 IMPLANT
SYR CONTROL 10ML LL (SYRINGE) IMPLANT
TOWEL GREEN STERILE FF (TOWEL DISPOSABLE) ×3 IMPLANT
TOWEL OR NON WOVEN STRL DISP B (DISPOSABLE) ×3 IMPLANT
TRAY DSU PREP LF (CUSTOM PROCEDURE TRAY) ×3 IMPLANT
TUBE CONNECTING 20'X1/4 (TUBING) ×1
TUBE CONNECTING 20X1/4 (TUBING) ×2 IMPLANT
UNDERPAD 30X30 (UNDERPADS AND DIAPERS) ×3 IMPLANT
WIRE FX150X1.25XDRL TIP (WIRE) IMPLANT
YANKAUER SUCT BULB TIP NO VENT (SUCTIONS) ×3 IMPLANT

## 2018-05-06 NOTE — Anesthesia Procedure Notes (Signed)
Anesthesia Regional Block: Popliteal block   Pre-Anesthetic Checklist: ,, timeout performed, Correct Patient, Correct Site, Correct Laterality, Correct Procedure, Correct Position, site marked, Risks and benefits discussed,  Surgical consent,  Pre-op evaluation,  At surgeon's request and post-op pain management  Laterality: Left  Prep: chloraprep       Needles:  Injection technique: Single-shot  Needle Type: Echogenic Needle     Needle Length: 9cm  Needle Gauge: 21     Additional Needles:   Procedures:,,,, ultrasound used (permanent image in chart),,,,  Narrative:  Start time: 05/06/2018 8:20 AM End time: 05/06/2018 8:27 AM Injection made incrementally with aspirations every 5 mL.  Performed by: Personally  Anesthesiologist: Catalina Gravel, MD  Additional Notes: No pain on injection. No increased resistance to injection. Injection made in 5cc increments.  Good needle visualization.  Patient tolerated procedure well.  Combined LEFT POPLITEAL/SAPHENOUS nerve block.

## 2018-05-06 NOTE — Transfer of Care (Signed)
Immediate Anesthesia Transfer of Care Note  Patient: Sherri Schultz  Procedure(s) Performed: OPEN REDUCTION INTERNAL FIXATION (ORIF) LEFT TRIMALLEOLAR ANKLE FRACTURE (Left Ankle)  Patient Location: PACU  Anesthesia Type:General and Regional  Level of Consciousness: awake and sedated  Airway & Oxygen Therapy: Patient Spontanous Breathing and Patient connected to face mask oxygen  Post-op Assessment: Report given to RN and Post -op Vital signs reviewed and stable  Post vital signs: Reviewed and stable  Last Vitals:  Vitals Value Taken Time  BP 138/59 05/06/2018 11:08 AM  Temp    Pulse 109 05/06/2018 11:09 AM  Resp 25 05/06/2018 11:09 AM  SpO2 99 % 05/06/2018 11:09 AM  Vitals shown include unvalidated device data.  Last Pain:  Vitals:   05/06/18 0740  TempSrc: Oral  PainSc: 5       Patients Stated Pain Goal: 2 (35/32/99 2426)  Complications: No apparent anesthesia complications

## 2018-05-06 NOTE — Op Note (Signed)
Date of Surgery: 05/06/2018  INDICATIONS: [name] is a 52 y.o.-year-old female who sustained a left ankle fracture; she was indicated for open reduction and internal fixation due to the displaced nature of the articular fracture and came to the operating room today for this procedure. The patient did consent to the procedure after discussion of the risks and benefits.  PREOPERATIVE DIAGNOSIS: left trimalleolar ankle fracture  POSTOPERATIVE DIAGNOSIS: Same.  PROCEDURE: Open treatment of left ankle fracture with internal fixation. Trimalleolar w/o fixation of posterior malleolus CPT 27822.   SURGEON: N. Eduard Roux, M.D.  ASSIST: Ciro Backer [place], [place]; necessary for the timely completion of procedure and due to complexity of procedure.  ANESTHESIA:  general, regional  TOURNIQUET TIME: 70 mins  IV FLUIDS AND URINE: See anesthesia.  ESTIMATED BLOOD LOSS: minimal mL.  IMPLANTS: [employer] 2.7 mm distal fibula plate; Synthes 3.5 mm medial hook plate  COMPLICATIONS: None.  DESCRIPTION OF PROCEDURE: The patient was brought to the operating room and placed supine on the operating table.  The patient had been signed prior to the procedure and this was documented. The patient had the anesthesia placed by the anesthesiologist.  A nonsterile tourniquet was placed on the upper thigh.  The prep verification and incision time-outs were performed to confirm that this was the correct patient, site, side and location. The patient had an SCD on the opposite lower extremity. The patient did receive antibiotics prior to the incision and was re-dosed during the procedure as needed at indicated intervals.  The patient had the lower extremity prepped and draped in the standard surgical fashion.  The extremity was exsanguinated using an esmarch bandage and the tourniquet was inflated to 300 mm Hg.  A lateral incision over the distal fibula was created.  Dissection was carried down through the  subcutaneous tissue.   Subperiosteal elevation was performed.  The fracture was exposed.  Entrapped periosteum and organized hematoma were removed from the fracture site.  The fracture was then brought out to length and overall reduction and provisionally clamped.  I then placed a anterior to posterior lag screw by technique by over drilling the near cortex.  The screw gave excellent compression and purchase.  The clamp was then removed.  A precontoured distal fibula plate was then placed on the lateral aspect of the fibula.  Nonlocking screws were placed proximal to the fracture each with excellent purchase.  Unicortical locking screws were placed distal to the fracture using fluoroscopic guidance.  Once this was done we then made a separate incision on the medial side of the ankle.  Dissection was carried through the subcutaneous tissue.  Branches of the saphenous vein were cauterized.  The main saphenous vein was mobilized and retracted.  We continued our dissection down to the medial malleolus.  Subperiosteal elevation was again performed.  The fracture was then exposed which did demonstrate significant comminution.  I felt that this was not amenable to purely cannulated screw fixation.  Therefore I used a hook plate in order to achieve more rigid fixation.  The implant was then placed at the appropriate position using fluoroscopic guidance.  A K wire was used to provisionally reduce the comminuted fracture.  I then placed 2 nonlocking 3.5 millimeter screws parallel to the ankle joint through the medial hook plate and a compression fashion.  The K wire was removed.  The medial malleolus remained reduced.  Stress exam showed no widening of the medial clear space.  Final x-rays were  taken.  The wounds were then thoroughly irrigated with normal saline.  They were closed in layered fashion using 0 Vicryl, 2-0 Vicryl, 3-0 nylon.  Sterile dressings were applied.  Short leg splint was placed.  Patient tolerated  procedure well had no major complications.  POSTOPERATIVE PLAN: [name] will remain nonweightbearing on this leg for approximately 6 weeks; [name] will return for suture removal in 2 weeks.  He will be immobilized in a short leg splint and then transitioned to a CAM walker at his first follow up appointment.  [name] will receive DVT prophylaxis based on other medications, activity level, and risk ratio of bleeding to thrombosis.  Azucena Cecil, MD [place] 10:37 AM

## 2018-05-06 NOTE — H&P (Signed)
PREOPERATIVE H&P  Chief Complaint: left trimalleolar ankle fracture  HPI: Sherri Schultz is a 52 y.o. female who presents for surgical treatment of left trimalleolar ankle fracture.  She denies any changes in medical history.  Past Medical History:  Diagnosis Date  . Anxiety   . Cancer [facility]) 2013   right knee  . Trimalleolar fracture of ankle, closed, left, initial encounter    Past Surgical History:  Procedure Laterality Date  . MELANOMA EXCISION Right 2013   knee   Social History   Socioeconomic History  . Marital status: Single    Spouse name: Not on file  . Number of children: Not on file  . Years of education: Not on file  . Highest education level: Not on file  Occupational History  . Not on file  Social Needs  . Financial resource strain: Not on file  . Food insecurity:    Worry: Not on file    Inability: Not on file  . Transportation needs:    Medical: Not on file    Non-medical: Not on file  Tobacco Use  . Smoking status: Never Smoker  . Smokeless tobacco: Never Used  Substance and Sexual Activity  . Alcohol use: Never    Frequency: Never  . Drug use: Never  . Sexual activity: Not on file  Lifestyle  . Physical activity:    Days per week: Not on file    Minutes per session: Not on file  . Stress: Not on file  Relationships  . Social connections:    Talks on phone: Not on file    Gets together: Not on file    Attends religious service: Not on file    Active member of club or organization: Not on file    Attends meetings of clubs or organizations: Not on file    Relationship status: Not on file  Other Topics Concern  . Not on file  Social History Narrative  . Not on file   History reviewed. No pertinent family history. No Known Allergies Prior to Admission medications   Medication Sig Start Date End Date Taking? Authorizing Provider  meclizine (ANTIVERT) 25 MG tablet Take 1 tablet by mouth 3 (three) times daily as needed. 02/26/17  Yes  [provider]  oxyCODONE-acetaminophen (PERCOCET/ROXICET) 5-325 MG tablet Take 2 tablets by mouth every 4 (four) hours as needed for severe pain. 05/01/18  Yes Fransico Meadow, PA-C  valACYclovir (VALTREX) 1000 MG tablet Take 2 tablets by mouth 2 (two) times daily as needed. 02/04/18  Yes [provider]  venlafaxine XR (EFFEXOR-XR) 150 MG 24 hr capsule Take 1 capsule by mouth daily. 03/08/18  Yes [provider]  traZODone (DESYREL) 50 MG tablet Take 0.5-1 tablets by mouth at bedtime as needed. 06/17/17   [provider]     Positive ROS: All other systems have been reviewed and were otherwise negative with the exception of those mentioned in the HPI and as above.  Physical Exam: General: Alert, no acute distress Cardiovascular: No pedal edema Respiratory: No cyanosis, no use of accessory musculature GI: abdomen soft Skin: No lesions in the area of chief complaint Neurologic: Sensation intact distally Psychiatric: Patient is competent for consent with normal mood and affect Lymphatic: no lymphedema  MUSCULOSKELETAL: exam stable  Assessment: left trimalleolar ankle fracture  Plan: Plan for Procedure(s): OPEN REDUCTION INTERNAL FIXATION (ORIF) LEFT TRIMALLEOLAR ANKLE FRACTURE  The risks benefits and alternatives were discussed with the patient including but not  limited to the risks of nonoperative treatment, versus surgical intervention including infection, bleeding, nerve injury,  blood clots, cardiopulmonary complications, morbidity, mortality, among others, and they were willing to proceed.   Eduard Roux, MD   05/06/2018 8:21 AM

## 2018-05-06 NOTE — Discharge Instructions (Signed)
° ° °  1. Keep splint clean and dry 2. Elevate foot above level of the heart 3. Take aspirin to prevent blood clots 4. Take pain meds as needed 5. Strict non weight bearing to operative extremity Regional Anesthesia Blocks  1. Numbness or the inability to move the "blocked" extremity may last from 3-48 hours after placement. The length of time depends on the medication injected and your individual response to the medication. If the numbness is not going away after 48 hours, call your surgeon.  2. The extremity that is blocked will need to be protected until the numbness is gone and the  Strength has returned. Because you cannot feel it, you will need to take extra care to avoid injury. Because it may be weak, you may have difficulty moving it or using it. You may not know what position it is in without looking at it while the block is in effect.  3. For blocks in the legs and feet, returning to weight bearing and walking needs to be done carefully. You will need to wait until the numbness is entirely gone and the strength has returned. You should be able to move your leg and foot normally before you try and bear weight or walk. You will need someone to be with you when you first try to ensure you do not fall and possibly risk injury.  4. Bruising and tenderness at the needle site are common side effects and will resolve in a few days.  5. Persistent numbness or new problems with movement should be communicated to the surgeon or the [facility] ([phone])/  Surgery Center ([phone]).   Post Anesthesia Home Care Instructions  Activity: Get plenty of rest for the remainder of the day. A responsible individual must stay with you for 24 hours following the procedure.  For the next 24 hours, DO NOT: -Drive a car -Operate machinery -Drink alcoholic beverages -Take any medication unless instructed by your physician -Make any legal decisions or sign important  papers.  Meals: Start with liquid foods such as gelatin or soup. Progress to regular foods as tolerated. Avoid greasy, spicy, heavy foods. If nausea and/or vomiting occur, drink only clear liquids until the nausea and/or vomiting subsides. Call your physician if vomiting continues.  Special Instructions/Symptoms: Your throat may feel dry or sore from the anesthesia or the breathing tube placed in your throat during surgery. If this causes discomfort, gargle with warm salt water. The discomfort should disappear within 24 hours.  If you had a scopolamine patch placed behind your ear for the management of post- operative nausea and/or vomiting:  1. The medication in the patch is effective for 72 hours, after which it should be removed.  Wrap patch in a tissue and discard in the trash. Wash hands thoroughly with soap and water. 2. You may remove the patch earlier than 72 hours if you experience unpleasant side effects which may include dry mouth, dizziness or visual disturbances. 3. Avoid touching the patch. Wash your hands with soap and water after contact with the patch.     

## 2018-05-06 NOTE — Anesthesia Procedure Notes (Signed)
Procedure Name: LMA Insertion Performed by: Kumari Sculley W, CRNA Pre-anesthesia Checklist: Patient identified, Emergency Drugs available, Suction available and Patient being monitored Patient Re-evaluated:Patient Re-evaluated prior to induction Oxygen Delivery Method: Circle system utilized Preoxygenation: Pre-oxygenation with 100% oxygen Induction Type: IV induction Ventilation: Mask ventilation without difficulty LMA: LMA inserted LMA Size: 4.0 Number of attempts: 1 Placement Confirmation: positive ETCO2 Tube secured with: Tape Dental Injury: Teeth and Oropharynx as per pre-operative assessment        

## 2018-05-06 NOTE — Anesthesia Preprocedure Evaluation (Addendum)
Anesthesia Evaluation  Patient identified by MRN, date of birth, ID band Patient awake    Reviewed: Allergy & Precautions, NPO status , Patient's Chart, lab work & pertinent test results  Airway Mallampati: II  TM Distance: >3 FB Neck ROM: Full    Dental  (+) Teeth Intact, Dental Advisory Given, Chipped,    Pulmonary neg pulmonary ROS,    Pulmonary exam normal breath sounds clear to auscultation       Cardiovascular negative cardio ROS Normal cardiovascular exam Rhythm:Regular Rate:Normal     Neuro/Psych PSYCHIATRIC DISORDERS Anxiety negative neurological ROS     GI/Hepatic negative GI ROS, Neg liver ROS,   Endo/Other  negative endocrine ROS  Renal/GU negative Renal ROS     Musculoskeletal Left ankle fracture    Abdominal   Peds  Hematology negative hematology ROS (+)   Anesthesia Other Findings Day of surgery medications reviewed with the patient.  Reproductive/Obstetrics                            Anesthesia Physical Anesthesia Plan  ASA: II  Anesthesia Plan: General   Post-op Pain Management:  Regional for Post-op pain   Induction: Intravenous  PONV Risk Score and Plan: 3 and Midazolam, Dexamethasone and Ondansetron  Airway Management Planned: LMA  Additional Equipment:   Intra-op Plan:   Post-operative Plan: Extubation in OR  Informed Consent: I have reviewed the patients History and Physical, chart, labs and discussed the procedure including the risks, benefits and alternatives for the proposed anesthesia with the patient or authorized representative who has indicated his/her understanding and acceptance.   Dental advisory given  Plan Discussed with: CRNA  Anesthesia Plan Comments:         Anesthesia Quick Evaluation

## 2018-05-06 NOTE — Anesthesia Postprocedure Evaluation (Signed)
Anesthesia Post Note  Patient: Sherri Schultz  Procedure(s) Performed: OPEN REDUCTION INTERNAL FIXATION (ORIF) LEFT TRIMALLEOLAR ANKLE FRACTURE (Left Ankle)     Patient location during evaluation: PACU Anesthesia Type: General Level of consciousness: awake and alert Pain management: pain level controlled Vital Signs Assessment: post-procedure vital signs reviewed and stable Respiratory status: spontaneous breathing, nonlabored ventilation and respiratory function stable Cardiovascular status: blood pressure returned to baseline and stable Postop Assessment: no apparent nausea or vomiting Anesthetic complications: no    Last Vitals:  Vitals:   05/06/18 1145 05/06/18 1255  BP: 124/83 132/76  Pulse: 94 94  Resp: (!) 24 (!) 22  Temp:  36.4 C  SpO2: 97% 100%    Last Pain:  Vitals:   05/06/18 1255  TempSrc:   PainSc: 0-No pain    LLE Motor Response: No movement due to regional block (05/06/18 1255) LLE Sensation: No pain;No tingling;No sensation (absent) (05/06/18 1255)          Catalina Gravel

## 2018-05-06 NOTE — Progress Notes (Signed)
Assisted Dr. Wallis Mart with left, ultrasound guided, popliteal/saphenous block. Side rails up, monitors on throughout procedure. See vital signs in flow sheet. Tolerated Procedure well.

## 2018-05-07 ENCOUNTER — Encounter (HOSPITAL_BASED_OUTPATIENT_CLINIC_OR_DEPARTMENT_OTHER): Payer: Self-pay | Admitting: Orthopaedic Surgery

## 2018-05-20 ENCOUNTER — Ambulatory Visit (INDEPENDENT_AMBULATORY_CARE_PROVIDER_SITE_OTHER): Payer: BLUE CROSS/BLUE SHIELD | Admitting: Orthopaedic Surgery

## 2018-05-20 ENCOUNTER — Encounter (INDEPENDENT_AMBULATORY_CARE_PROVIDER_SITE_OTHER): Payer: Self-pay | Admitting: Orthopaedic Surgery

## 2018-05-20 ENCOUNTER — Ambulatory Visit (INDEPENDENT_AMBULATORY_CARE_PROVIDER_SITE_OTHER): Payer: Self-pay

## 2018-05-20 DIAGNOSIS — S82852A Displaced trimalleolar fracture of left lower leg, initial encounter for closed fracture: Secondary | ICD-10-CM

## 2018-05-20 NOTE — Progress Notes (Signed)
   Post-Op Visit Note   Patient: Sherri Schultz           Date of Birth: March 19, 1966           MRN: [id number] Visit Date: 05/20/2018 PCP: Vicenta Aly, FNP   Assessment & Plan:  Chief Complaint:  Chief Complaint  Patient presents with  . Left Ankle - Pain, Routine Post Op   Visit Diagnoses:  1. Closed displaced trimalleolar fracture of left ankle, initial encounter     Plan: Patient is a pleasant 52 year old female who presents to our clinic today 14 days status post ORIF left trimalleolar ankle fracture.  She has been compliant in her postop splint nonweightbearing.  She has been elevating for swelling.  Minimal pain.  She is taking an occasional Tylenol at night for this.  No fevers, chills or any other systemic symptoms.  Examination of her left ankle reveals well-healing surgical incisions with nylon sutures in place.  Slight peri-incisional erythema.  No signs of cellulitis or infection.  Moderate swelling.  Calf is soft and nontender.  She is neurovascularly intact distally.  Today, nylon sutures were removed and Steri-Strips applied.  We will place the patient in a cam walker nonweightbearing for the next 4 weeks.  She will follow-up with [place] at that time for repeat evaluation and x-ray.  Follow-Up Instructions: Return in about 1 month (around 06/17/2018).   Orders:  Orders Placed This Encounter  Procedures  . XR Ankle Complete Left   No orders of the defined types were placed in this encounter.   Imaging: Xr Ankle Complete Left  Result Date: 05/20/2018 Stable alignment of the fracture   PMFS History: There are no active problems to display for this patient.  Past Medical History:  Diagnosis Date  . Anxiety   . Cancer [facility]) 2013   right knee  . Trimalleolar fracture of ankle, closed, left, initial encounter     History reviewed. No pertinent family history.  Past Surgical History:  Procedure Laterality Date  . MELANOMA EXCISION Right 2013   knee  . ORIF  ANKLE FRACTURE Left 05/06/2018   Procedure: OPEN REDUCTION INTERNAL FIXATION (ORIF) LEFT TRIMALLEOLAR ANKLE FRACTURE;  Surgeon: Leandrew Koyanagi, MD;  Location: [place];  Service: Orthopedics;  Laterality: Left;   Social History   Occupational History  . Not on file  Tobacco Use  . Smoking status: Never Smoker  . Smokeless tobacco: Never Used  Substance and Sexual Activity  . Alcohol use: Never    Frequency: Never  . Drug use: Never  . Sexual activity: Not on file

## 2018-06-19 ENCOUNTER — Encounter (INDEPENDENT_AMBULATORY_CARE_PROVIDER_SITE_OTHER): Payer: Self-pay | Admitting: Orthopaedic Surgery

## 2018-06-19 ENCOUNTER — Ambulatory Visit (INDEPENDENT_AMBULATORY_CARE_PROVIDER_SITE_OTHER): Payer: BLUE CROSS/BLUE SHIELD

## 2018-06-19 ENCOUNTER — Ambulatory Visit (INDEPENDENT_AMBULATORY_CARE_PROVIDER_SITE_OTHER): Payer: BLUE CROSS/BLUE SHIELD | Admitting: Orthopaedic Surgery

## 2018-06-19 DIAGNOSIS — S82853D Displaced trimalleolar fracture of unspecified lower leg, subsequent encounter for closed fracture with routine healing: Secondary | ICD-10-CM | POA: Insufficient documentation

## 2018-06-19 DIAGNOSIS — S82852D Displaced trimalleolar fracture of left lower leg, subsequent encounter for closed fracture with routine healing: Secondary | ICD-10-CM

## 2018-06-19 NOTE — Progress Notes (Signed)
   Post-Op Visit Note   Patient: Sherri Schultz           Date of Birth: August 12, 1966           MRN: [id number] Visit Date: 06/19/2018 PCP: Vicenta Aly, FNP   Assessment & Plan:  Chief Complaint:  Chief Complaint  Patient presents with  . Left Ankle - Routine Post Op   Visit Diagnoses:  1. Closed displaced trimalleolar fracture of left ankle with routine healing, subsequent encounter     Plan: Patient is a pleasant 52 year old female who presents to our clinic today 44 days status post ORIF left trimalleolar ankle fracture, date of surgery 05/06/2018.  She has been compliant nonweightbearing in her cam walker.  She has been using a knee scooter.  Very minimal pain.  No fevers, chills or any other systemic symptoms.  She has been using ice and elevating for pain and swelling.  Examination of the left ankle shows mild swelling.  Well-healing surgical incisions without evidence of cellulitis or infection.  Limited motion of the ankle secondary to stiffness.  She is neurovascularly intact distally.  At this point, we will transition her to weightbearing as tolerated using the cam walker.  We will start her in formal physical therapy for gentle range of motion as tolerated as well as ankle stabilization.  A prescription was given to the patient for this.  She will follow-up with [place] in 6 weeks time for repeat evaluation and 3 views x-ray of the left ankle.  Call with concerns or questions in the meantime.  Follow-Up Instructions: Return in about 6 weeks (around 07/31/2018).   Orders:  Orders Placed This Encounter  Procedures  . XR Ankle Complete Left   No orders of the defined types were placed in this encounter.   Imaging: Xr Ankle Complete Left  Result Date: 06/19/2018 X-rays demonstrate stable alignment and decreased lucency to the fracture sites   PMFS History: Patient Active Problem List   Diagnosis Date Noted  . Closed displaced trimalleolar fracture of lower leg with  routine healing 06/19/2018   Past Medical History:  Diagnosis Date  . Anxiety   . Cancer [facility]) 2013   right knee  . Trimalleolar fracture of ankle, closed, left, initial encounter     History reviewed. No pertinent family history.  Past Surgical History:  Procedure Laterality Date  . MELANOMA EXCISION Right 2013   knee  . ORIF ANKLE FRACTURE Left 05/06/2018   Procedure: OPEN REDUCTION INTERNAL FIXATION (ORIF) LEFT TRIMALLEOLAR ANKLE FRACTURE;  Surgeon: Leandrew Koyanagi, MD;  Location: [place];  Service: Orthopedics;  Laterality: Left;   Social History   Occupational History  . Not on file  Tobacco Use  . Smoking status: Never Smoker  . Smokeless tobacco: Never Used  Substance and Sexual Activity  . Alcohol use: Never    Frequency: Never  . Drug use: Never  . Sexual activity: Not on file

## 2018-07-15 ENCOUNTER — Other Ambulatory Visit: Payer: Self-pay | Admitting: Nurse Practitioner

## 2018-07-15 DIAGNOSIS — Z1231 Encounter for screening mammogram for malignant neoplasm of breast: Secondary | ICD-10-CM

## 2018-07-17 ENCOUNTER — Telehealth (INDEPENDENT_AMBULATORY_CARE_PROVIDER_SITE_OTHER): Payer: Self-pay | Admitting: Orthopaedic Surgery

## 2018-07-17 NOTE — Telephone Encounter (Signed)
She does not need abx from our stand point

## 2018-07-17 NOTE — Telephone Encounter (Signed)
See message below °

## 2018-07-17 NOTE — Telephone Encounter (Signed)
Patient is needing to go to the dentist because she chipped a tooth, she just had surgery a couple of months ago and needs to know about a pre dental antibiotic from Dr. Erlinda Hong. She still uses [facility] in [place]. Patient requesting a call back. #  [phone]

## 2018-07-17 NOTE — Telephone Encounter (Signed)
Called patient to let her know

## 2018-07-31 ENCOUNTER — Ambulatory Visit (INDEPENDENT_AMBULATORY_CARE_PROVIDER_SITE_OTHER): Payer: Self-pay

## 2018-07-31 ENCOUNTER — Encounter (INDEPENDENT_AMBULATORY_CARE_PROVIDER_SITE_OTHER): Payer: Self-pay | Admitting: Orthopaedic Surgery

## 2018-07-31 ENCOUNTER — Ambulatory Visit (INDEPENDENT_AMBULATORY_CARE_PROVIDER_SITE_OTHER): Payer: BLUE CROSS/BLUE SHIELD | Admitting: Orthopaedic Surgery

## 2018-07-31 DIAGNOSIS — S82852D Displaced trimalleolar fracture of left lower leg, subsequent encounter for closed fracture with routine healing: Secondary | ICD-10-CM | POA: Diagnosis not present

## 2018-07-31 NOTE — Progress Notes (Signed)
   Post-Op Visit Note   Patient: Sherri Schultz           Date of Birth: [birth year]           MRN: [id number] Visit Date: 07/31/2018 PCP: Vicenta Aly, FNP   Assessment & Plan:  Chief Complaint:  Chief Complaint  Patient presents with  . Left Ankle - Pain   Visit Diagnoses:  1. Closed displaced trimalleolar fracture of left ankle with routine healing, subsequent encounter     Plan: Sherri Schultz is 3 months status post ORIF left trimalleolar fracture.  Overall she is doing well reports no pain.  Her surgical scars are fully healed.  She has minimal swelling.  She does have some atrophy of her calf.  At this point we will wean her to an ASO brace and she will continue with home exercises for strengthening.  They are going down to [place] for 3 months.  I would like to see them when they come back for final recheck.  She is to wean ASO brace as tolerated.  Showed the ankle on return.  If she is doing well at that time anticipate releasing her.  Follow-Up Instructions: Return in about 3 months (around 10/30/2018).   Orders:  Orders Placed This Encounter  Procedures  . XR Ankle Complete Left   No orders of the defined types were placed in this encounter.   Imaging: Xr Ankle Complete Left  Result Date: 07/31/2018 Stable fixation of trimalleolar ankle fracture with demonstration of fracture healing.   PMFS History: Patient Active Problem List   Diagnosis Date Noted  . Closed displaced trimalleolar fracture of lower leg with routine healing 06/19/2018   Past Medical History:  Diagnosis Date  . Anxiety   . Cancer [facility]) 2013   right knee  . Trimalleolar fracture of ankle, closed, left, initial encounter     History reviewed. No pertinent family history.  Past Surgical History:  Procedure Laterality Date  . MELANOMA EXCISION Right 2013   knee  . ORIF ANKLE FRACTURE Left 05/06/2018   Procedure: OPEN REDUCTION INTERNAL FIXATION (ORIF) LEFT TRIMALLEOLAR ANKLE FRACTURE;  Surgeon:  Leandrew Koyanagi, MD;  Location: [place];  Service: Orthopedics;  Laterality: Left;   Social History   Occupational History  . Not on file  Tobacco Use  . Smoking status: Never Smoker  . Smokeless tobacco: Never Used  Substance and Sexual Activity  . Alcohol use: Never    Frequency: Never  . Drug use: Never  . Sexual activity: Not on file

## 2018-10-19 ENCOUNTER — Ambulatory Visit
Admission: RE | Admit: 2018-10-19 | Discharge: 2018-10-19 | Disposition: A | Discharge status: Home / Self Care | Payer: BLUE CROSS/BLUE SHIELD | Source: Ambulatory Visit | Attending: Nurse Practitioner | Admitting: Nurse Practitioner

## 2018-10-19 DIAGNOSIS — Z1231 Encounter for screening mammogram for malignant neoplasm of breast: Secondary | ICD-10-CM

## 2018-10-19 IMAGING — MG DIGITAL SCREENING BILATERAL MAMMOGRAM WITH TOMO AND CAD
8 series · 8 of 24 positions shown · non-contrast
Comparison: Previous exam(s).

CLINICAL DATA: Screening.

EXAM:
DIGITAL SCREENING BILATERAL MAMMOGRAM WITH TOMO AND CAD

[R CC synth-2D]
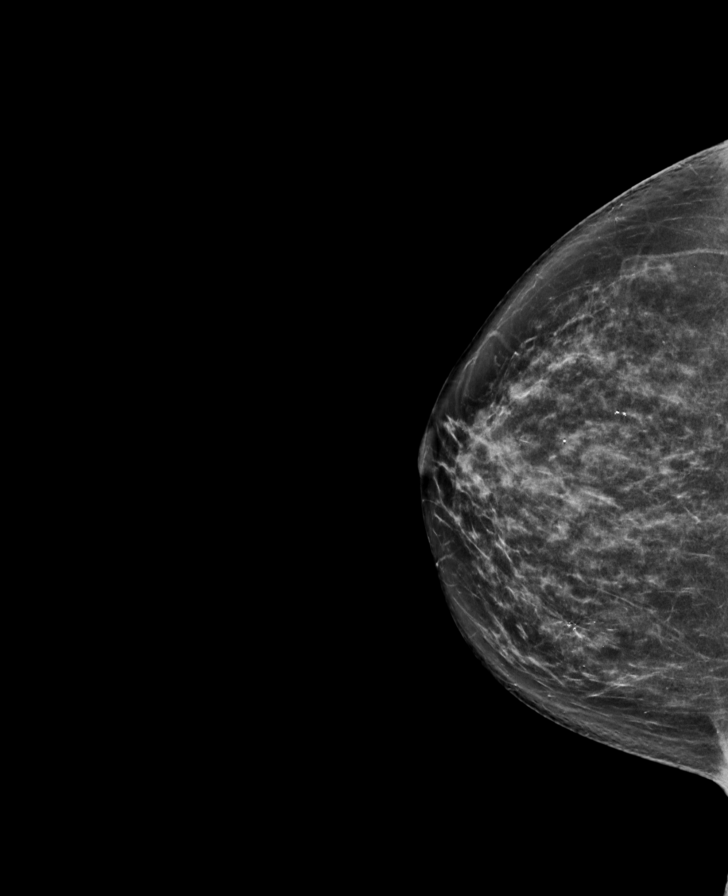

[L CC synth-2D]
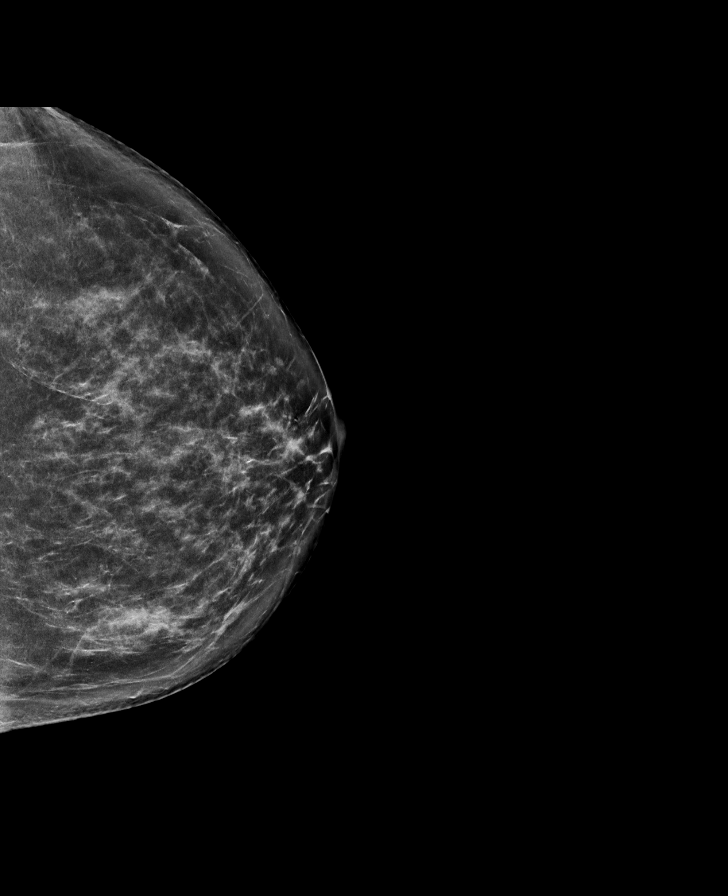

[R MLO synth-2D]
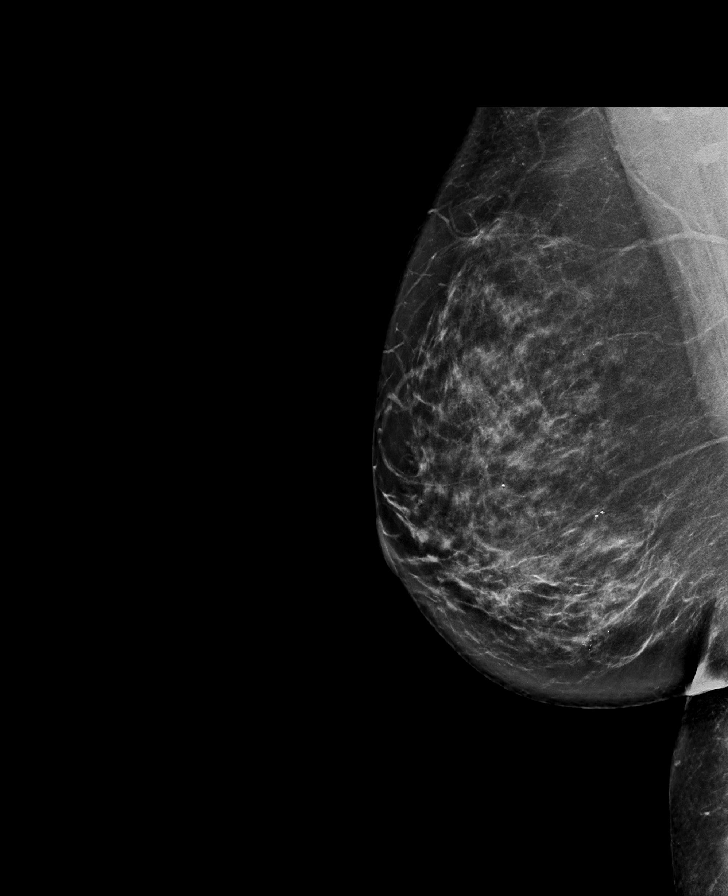

[L MLO synth-2D]
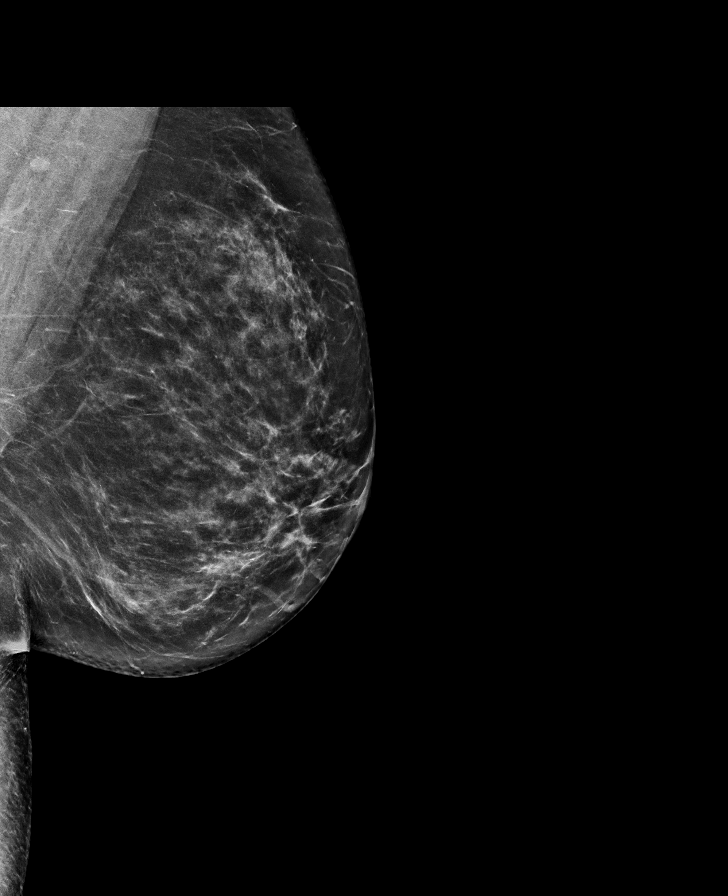

[R MLO tomo · tomo slice 43/86.0]
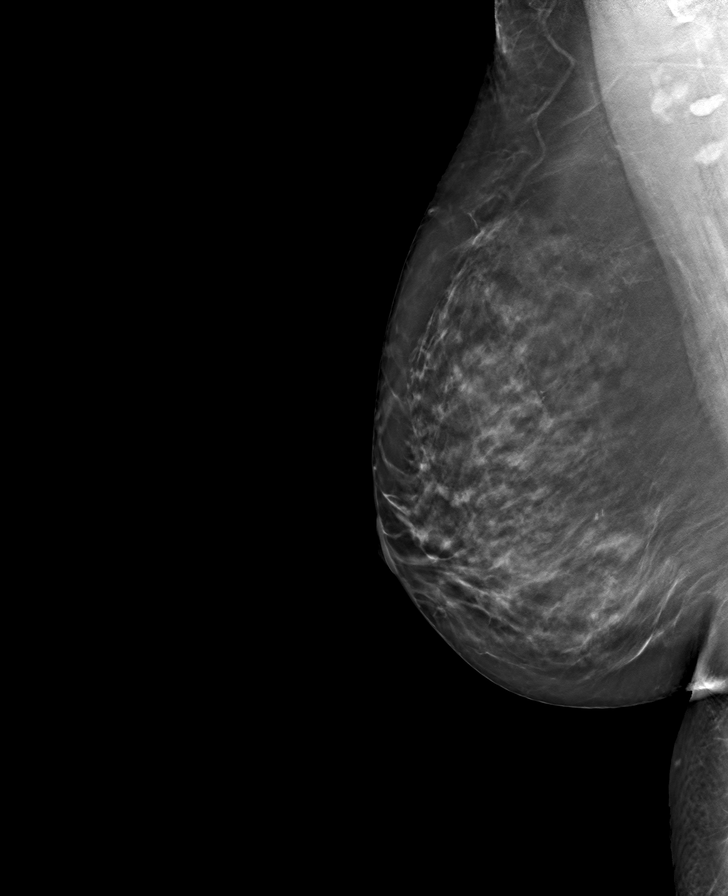

[L CC tomo · tomo slice 41/80.0]
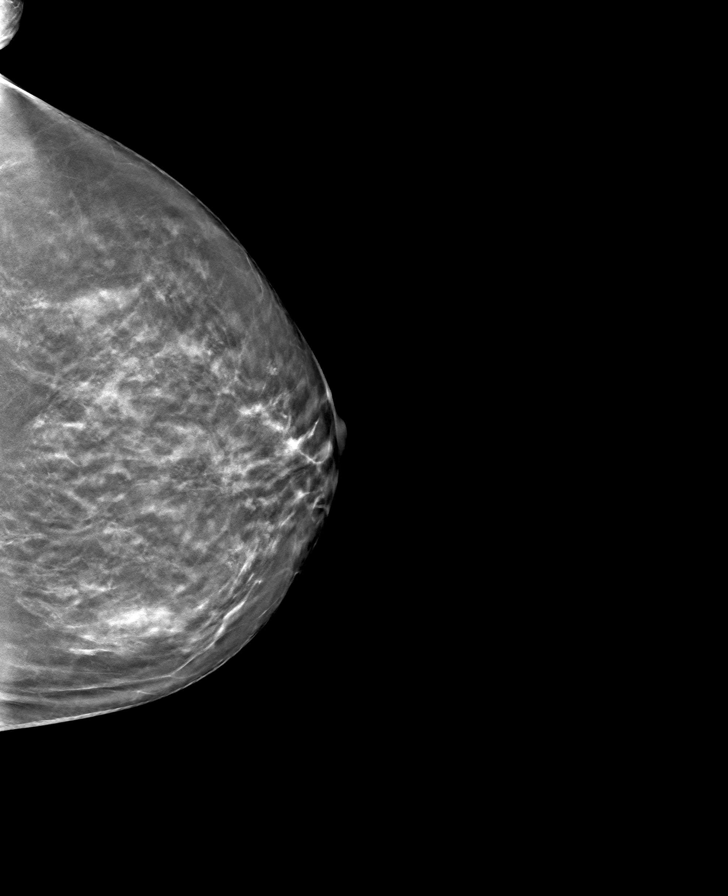

[R CC tomo · tomo slice 39/77.0]
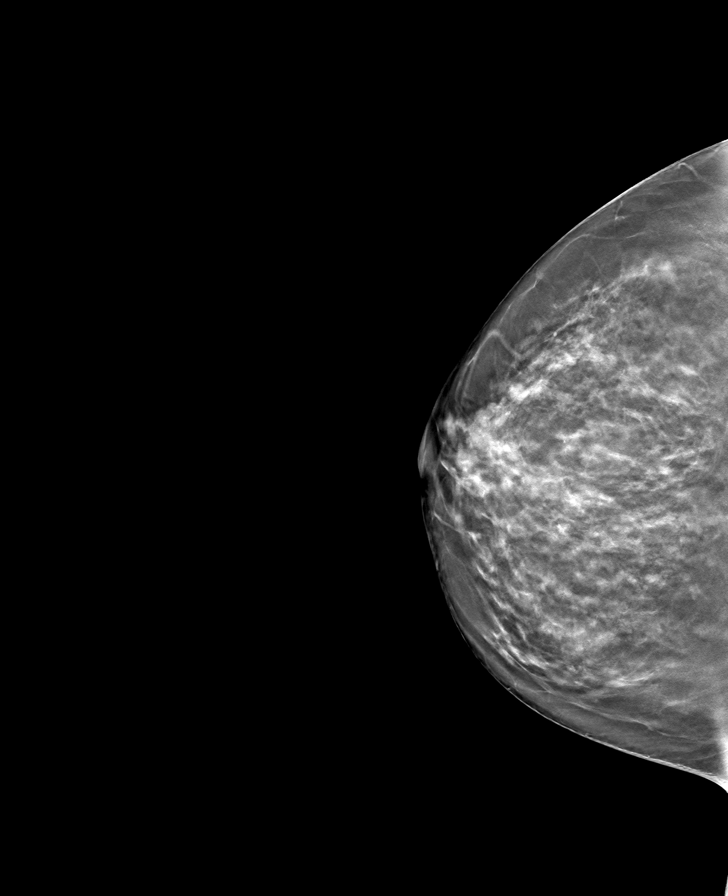

[L MLO tomo · tomo slice 39/77.0]
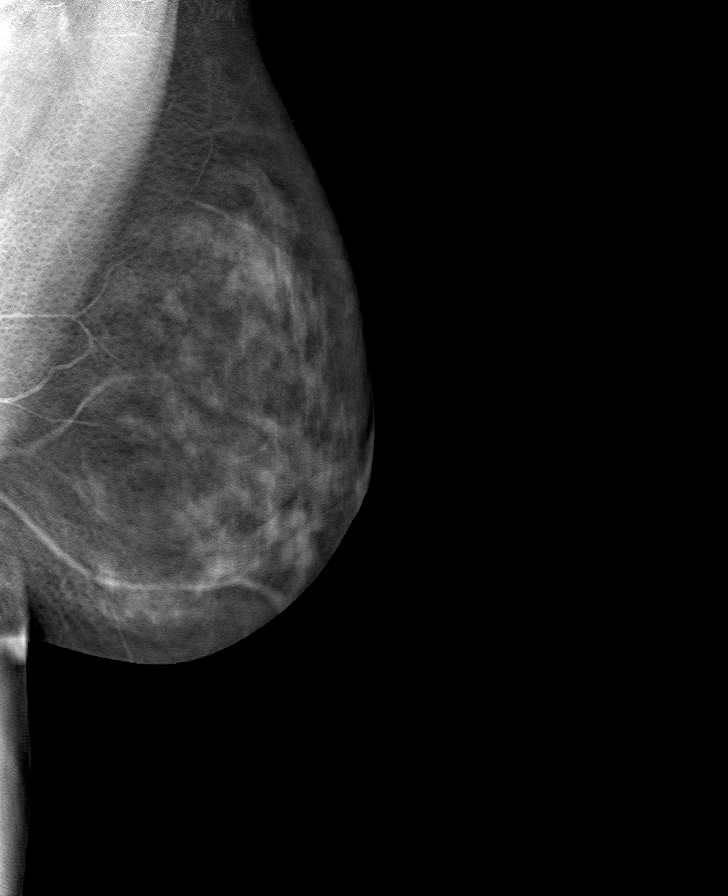

[8 of 24 positions shown; findings below may reference images not displayed]

ACR Breast Density Category c: The breast tissue is heterogeneously
dense, which may obscure small masses.
FINDINGS: There are no findings suspicious for malignancy. Images were
processed with CAD.
IMPRESSION: No mammographic evidence of malignancy. A result letter of this
screening mammogram will be mailed directly to the patient.

RECOMMENDATION:
Screening mammogram in one year. (Code:[5V])

BI-RADS CATEGORY  1: Negative.

## 2018-10-30 ENCOUNTER — Ambulatory Visit (INDEPENDENT_AMBULATORY_CARE_PROVIDER_SITE_OTHER): Payer: Self-pay

## 2018-10-30 ENCOUNTER — Ambulatory Visit (INDEPENDENT_AMBULATORY_CARE_PROVIDER_SITE_OTHER): Payer: BLUE CROSS/BLUE SHIELD | Admitting: Orthopaedic Surgery

## 2018-10-30 ENCOUNTER — Encounter (INDEPENDENT_AMBULATORY_CARE_PROVIDER_SITE_OTHER): Payer: Self-pay | Admitting: Orthopaedic Surgery

## 2018-10-30 DIAGNOSIS — S82852D Displaced trimalleolar fracture of left lower leg, subsequent encounter for closed fracture with routine healing: Secondary | ICD-10-CM | POA: Diagnosis not present

## 2018-10-30 NOTE — Progress Notes (Signed)
    Patient: Sherri Schultz           Date of Birth: 03/25/1966           MRN: [id number] Visit Date: 10/30/2018 PCP: Vicenta Aly, FNP   Assessment & Plan:  Chief Complaint:  Chief Complaint  Patient presents with  . Left Ankle - Follow-up   Visit Diagnoses:  1. Closed displaced trimalleolar fracture of left ankle with routine healing, subsequent encounter     Plan: Sherri Schultz is 15-month status post ORIF left trimalleolar ankle fracture dislocation.  She is doing well.  She reports very little swelling.  She takes occasional naproxen.  She has been increasing her activity and has essentially resumed her normal activity.  Surgical scars are fully healed.  She has no swelling.  She has good range of motion of her ankle.  Her x-rays demonstrate healing of the fracture without any complications.  At this point I would like her to increase activity as tolerated.  I gave her a prescription for stationary bike to help with ankle range of motion with strengthening.  From my standpoint we can see her back as needed.  Questions encouraged and answered.  Follow-Up Instructions: Return if symptoms worsen or fail to improve.   Orders:  Orders Placed This Encounter  Procedures  . XR Ankle Complete Left   No orders of the defined types were placed in this encounter.   Imaging: Xr Ankle Complete Left  Result Date: 10/30/2018 Healed trimalleolar ankle fracture with uncomplicated hardware   PMFS History: Patient Active Problem List   Diagnosis Date Noted  . Closed displaced trimalleolar fracture of lower leg with routine healing 06/19/2018   Past Medical History:  Diagnosis Date  . Anxiety   . Cancer Loc [facility]) 2013   right knee  . Trimalleolar fracture of ankle, closed, left, initial encounter     Family History  Problem Relation Age of Onset  . Breast cancer Mother     Past Surgical History:  Procedure Laterality Date  . MELANOMA EXCISION Right 2013   knee  . ORIF ANKLE FRACTURE  Left 05/06/2018   Procedure: OPEN REDUCTION INTERNAL FIXATION (ORIF) LEFT TRIMALLEOLAR ANKLE FRACTURE;  Surgeon: Leandrew Koyanagi, MD;  Location: [place];  Service: Orthopedics;  Laterality: Left;   Social History   Occupational History  . Not on file  Tobacco Use  . Smoking status: Never Smoker  . Smokeless tobacco: Never Used  Substance and Sexual Activity  . Alcohol use: Never    Frequency: Never  . Drug use: Never  . Sexual activity: Not on file

## 2019-11-26 DIAGNOSIS — C50919 Malignant neoplasm of unspecified site of unspecified female breast: Secondary | ICD-10-CM

## 2019-11-26 HISTORY — DX: Malignant neoplasm of unspecified site of unspecified female breast: C50.919

## 2019-11-29 ENCOUNTER — Other Ambulatory Visit: Payer: Self-pay | Admitting: Nurse Practitioner

## 2019-11-29 DIAGNOSIS — Z1231 Encounter for screening mammogram for malignant neoplasm of breast: Secondary | ICD-10-CM

## 2019-12-01 ENCOUNTER — Ambulatory Visit
Admission: RE | Admit: 2019-12-01 | Discharge: 2019-12-01 | Disposition: A | Discharge status: Home / Self Care | Payer: BC Managed Care – PPO | Source: Ambulatory Visit | Attending: Nurse Practitioner | Admitting: Nurse Practitioner

## 2019-12-01 ENCOUNTER — Other Ambulatory Visit: Payer: Self-pay

## 2019-12-01 DIAGNOSIS — Z1231 Encounter for screening mammogram for malignant neoplasm of breast: Secondary | ICD-10-CM

## 2019-12-01 IMAGING — MG DIGITAL SCREENING BILAT W/ TOMO W/ CAD
8 series · 8 of 24 positions shown · non-contrast
Comparison: Previous exam(s).

CLINICAL DATA: Screening.

EXAM:
DIGITAL SCREENING BILATERAL MAMMOGRAM WITH TOMO AND CAD

[R CC synth-2D]
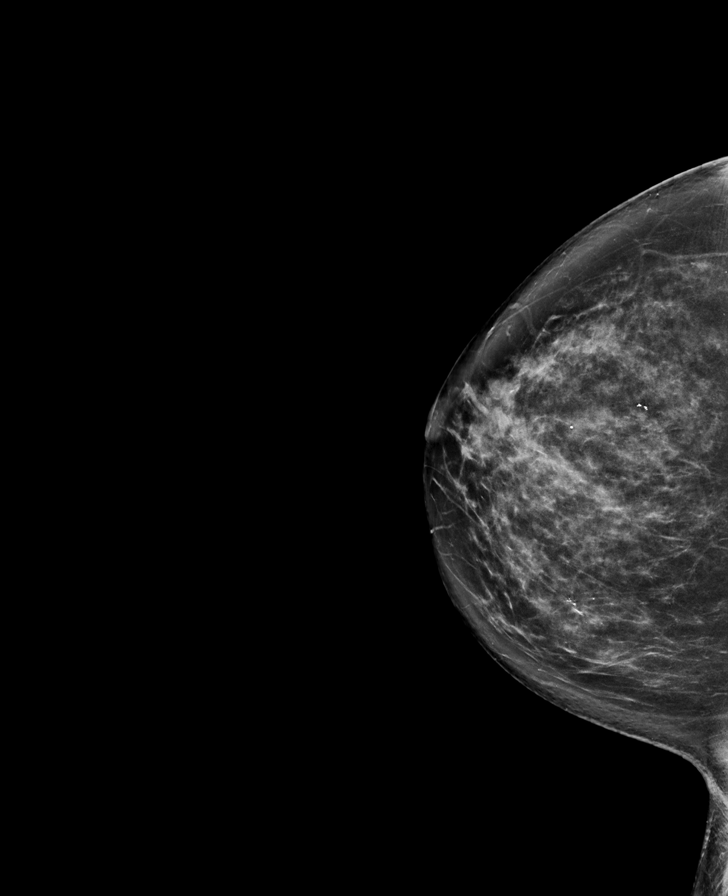

[L CC synth-2D]
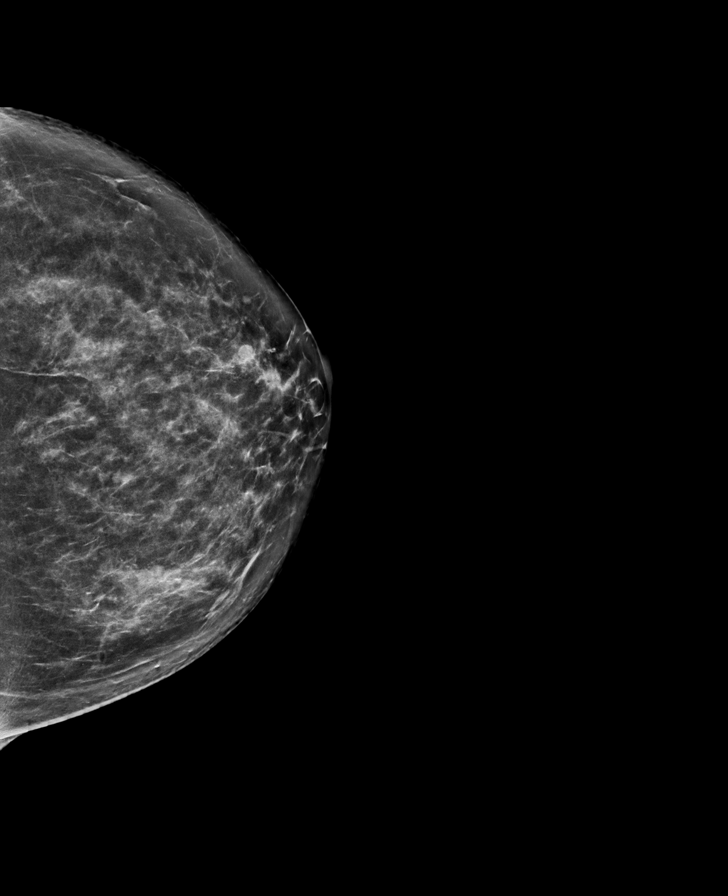

[R MLO synth-2D]
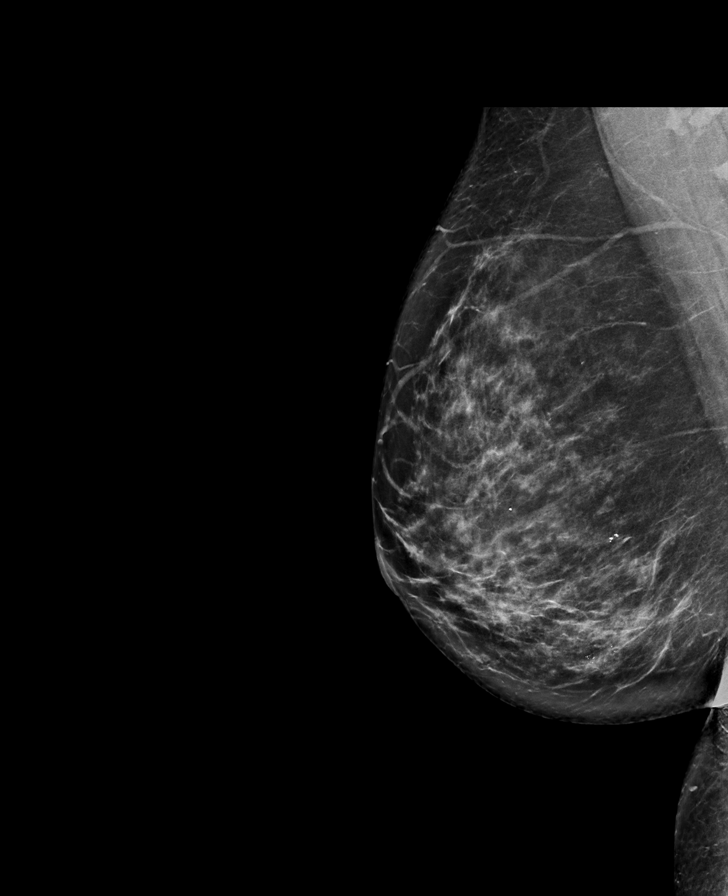

[L MLO synth-2D]
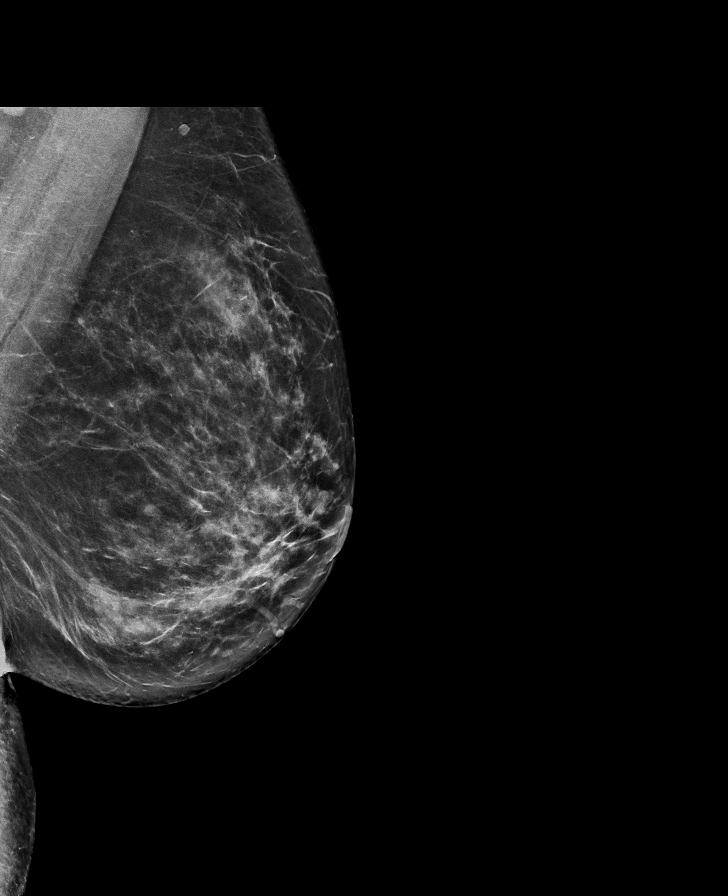

[L CC tomo · tomo slice 39/76.0]
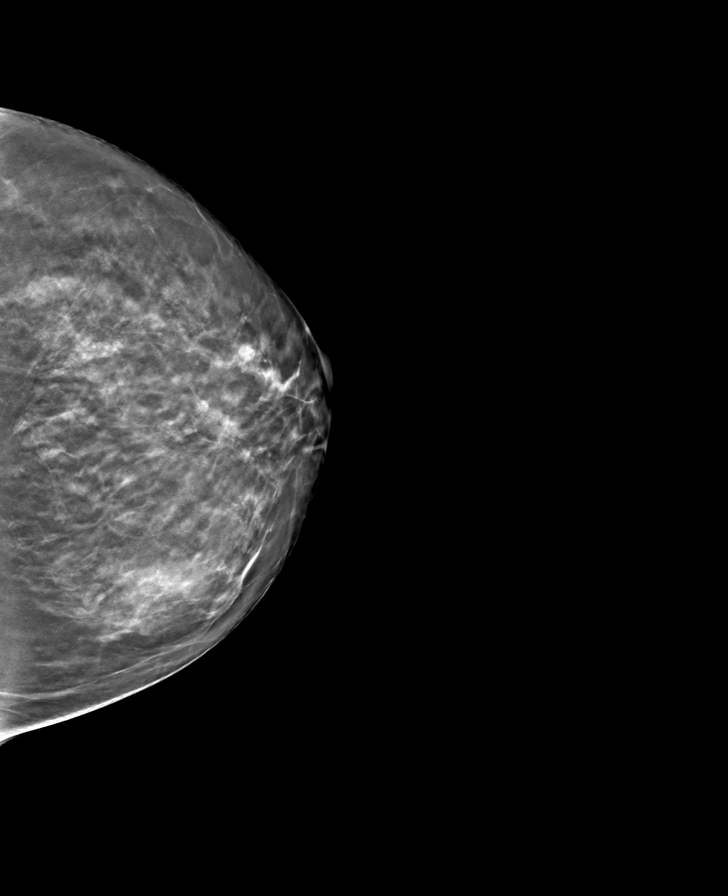

[R MLO tomo · tomo slice 41/80.0]
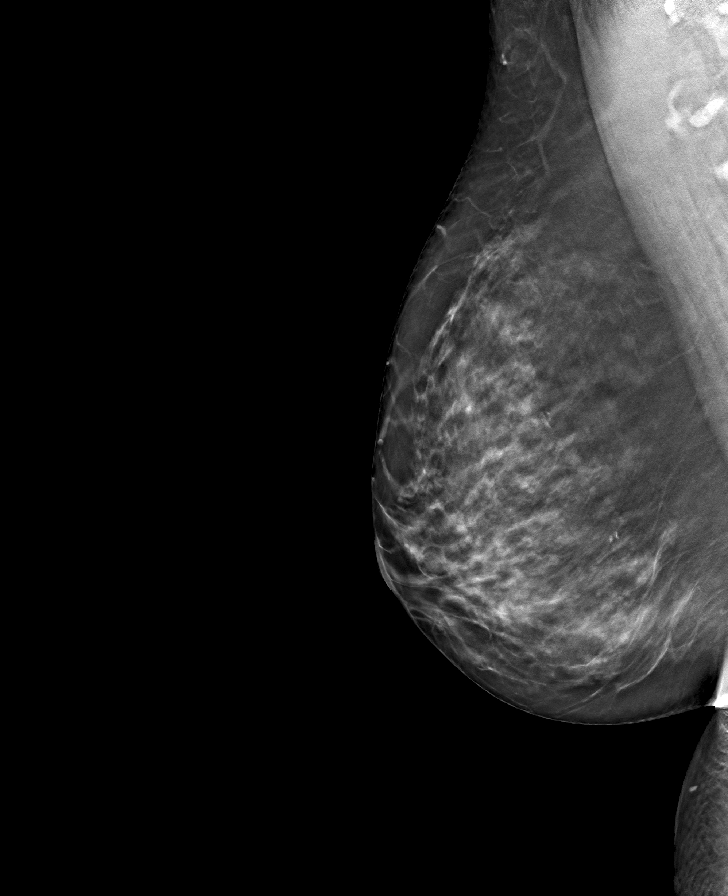

[L MLO tomo · tomo slice 39/77.0]
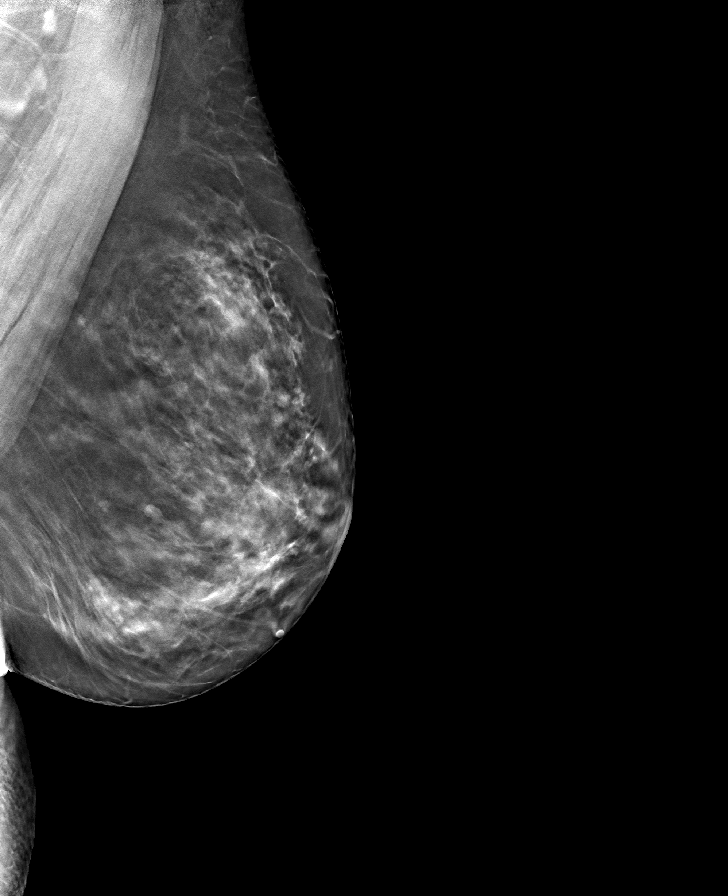

[R CC tomo · tomo slice 41/81.0]
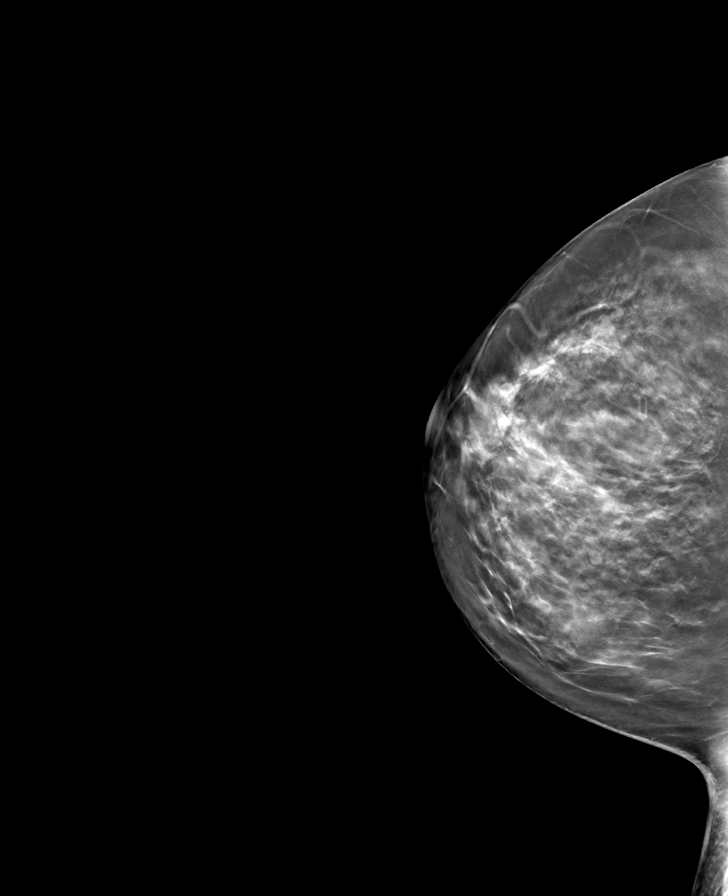

[8 of 24 positions shown; findings below may reference images not displayed]

ACR Breast Density Category c: The breast tissue is heterogeneously
dense, which may obscure small masses.
FINDINGS: In the left breast, possible masses warrant further evaluation. In
the right breast, no findings suspicious for malignancy.

Images were processed with CAD.
IMPRESSION: Further evaluation is suggested for possible masses in the left
breast.

RECOMMENDATION:
Diagnostic mammogram and possibly ultrasound of the left breast.
(Code:[MP])

The patient will be contacted regarding the findings, and additional
imaging will be scheduled.

BI-RADS CATEGORY  0: Incomplete. Need additional imaging evaluation
and/or prior mammograms for comparison.

## 2019-12-02 ENCOUNTER — Other Ambulatory Visit: Payer: Self-pay | Admitting: Nurse Practitioner

## 2019-12-02 DIAGNOSIS — R928 Other abnormal and inconclusive findings on diagnostic imaging of breast: Secondary | ICD-10-CM

## 2019-12-08 ENCOUNTER — Ambulatory Visit
Admission: RE | Admit: 2019-12-08 | Discharge: 2019-12-08 | Disposition: A | Discharge status: Home / Self Care | Payer: BC Managed Care – PPO | Source: Ambulatory Visit | Attending: Nurse Practitioner | Admitting: Nurse Practitioner

## 2019-12-08 ENCOUNTER — Other Ambulatory Visit: Payer: Self-pay

## 2019-12-08 ENCOUNTER — Other Ambulatory Visit: Payer: Self-pay | Admitting: Nurse Practitioner

## 2019-12-08 DIAGNOSIS — R928 Other abnormal and inconclusive findings on diagnostic imaging of breast: Secondary | ICD-10-CM

## 2019-12-08 IMAGING — MG MM DIGITAL DIAGNOSTIC UNILAT*L* W/ TOMO W/ CAD
6 of 10 series · 6 of 30 positions shown · non-contrast
Comparison: Previous exam(s).

CLINICAL DATA: Recall from screening mammography with
tomosynthesis, possible masses involving the LEFT breast. Family
history of breast cancer in mother at age 68 and in a maternal aunt.

EXAM:
DIGITAL DIAGNOSTIC LEFT MAMMOGRAM WITH TOMO
ULTRASOUND LEFT BREAST

[L CC synth-2D (1 of 3)]
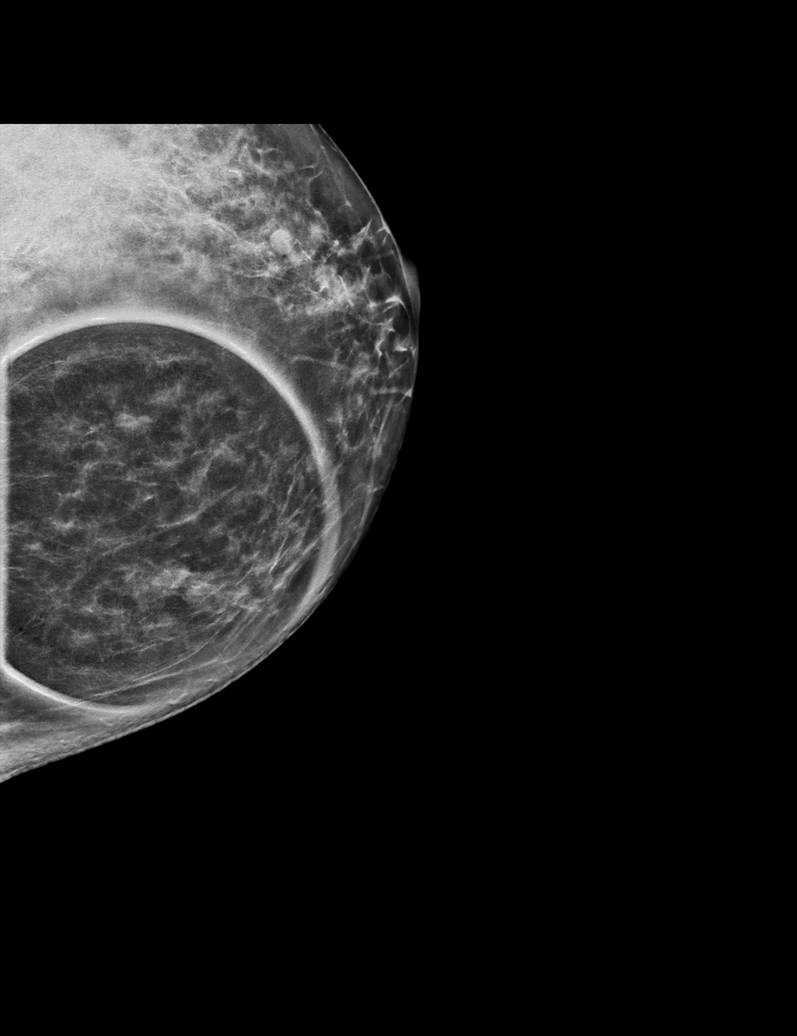

[L MLO synth-2D (1 of 2)]
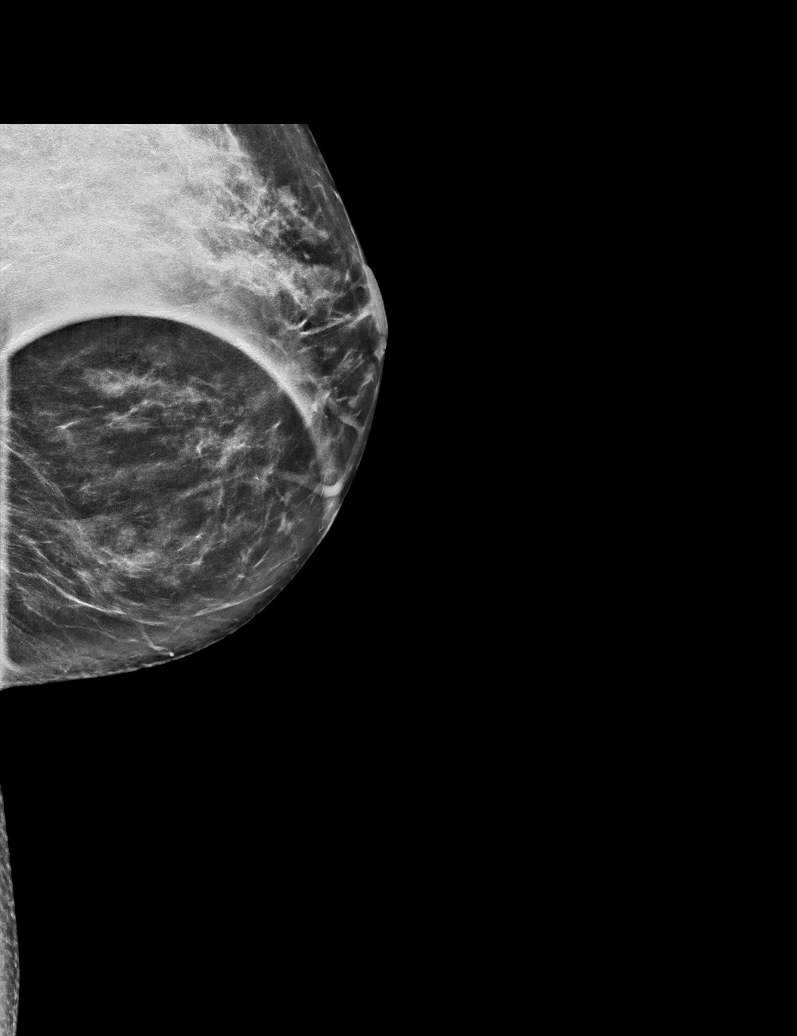

[L CC synth-2D (2 of 3)]
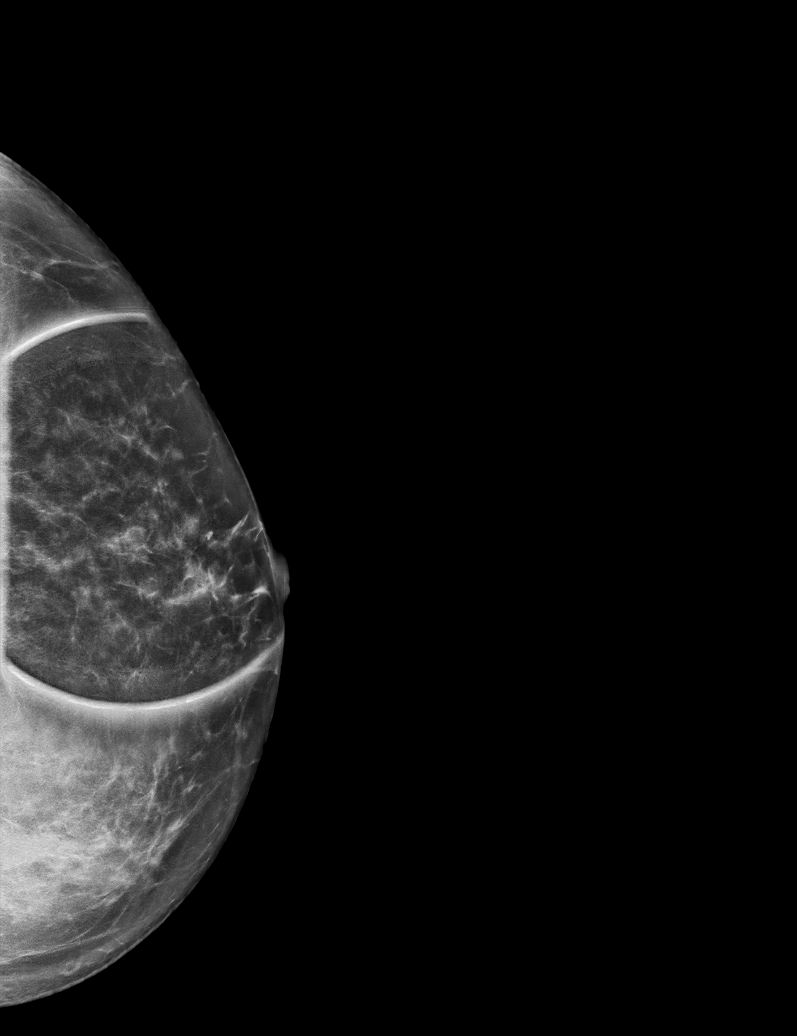

[L CC synth-2D (3 of 3)]
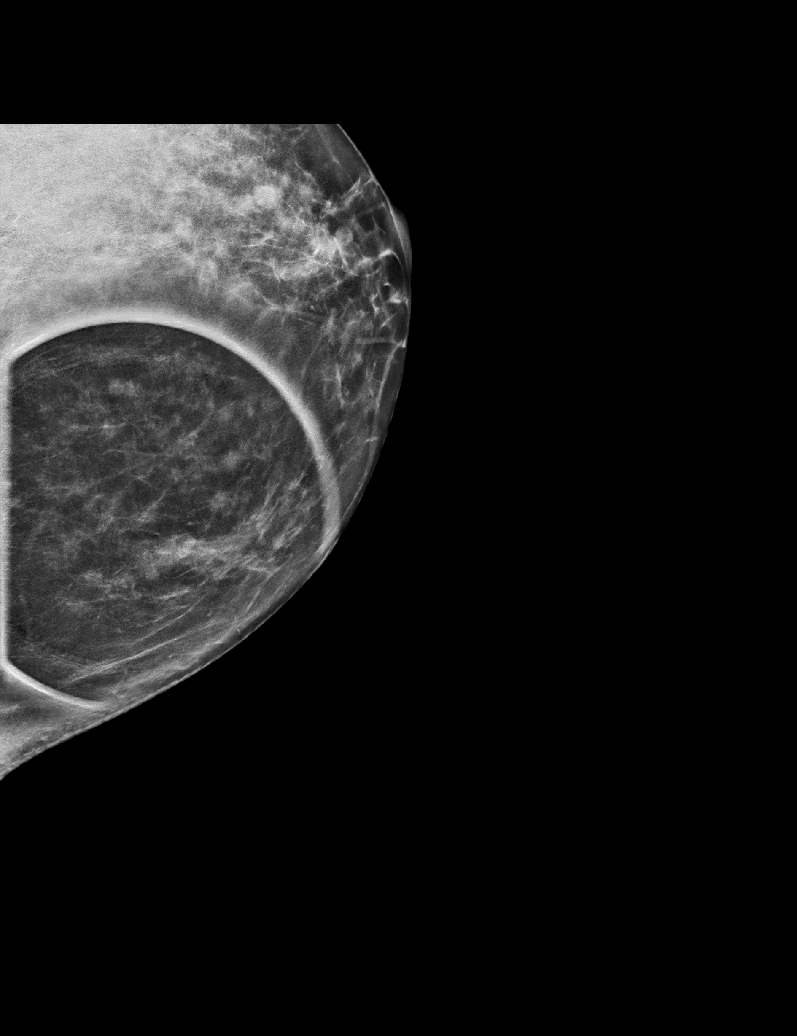

[L MLO synth-2D (2 of 2)]
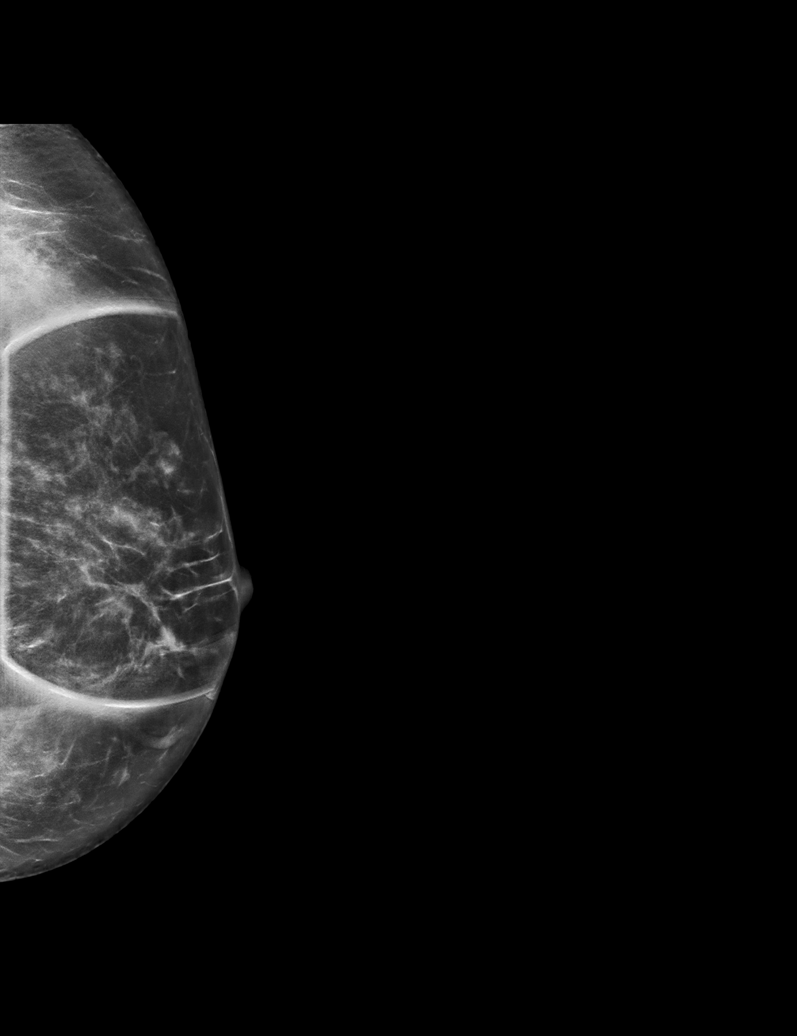

[L CC tomo · tomo slice 31/61.0]
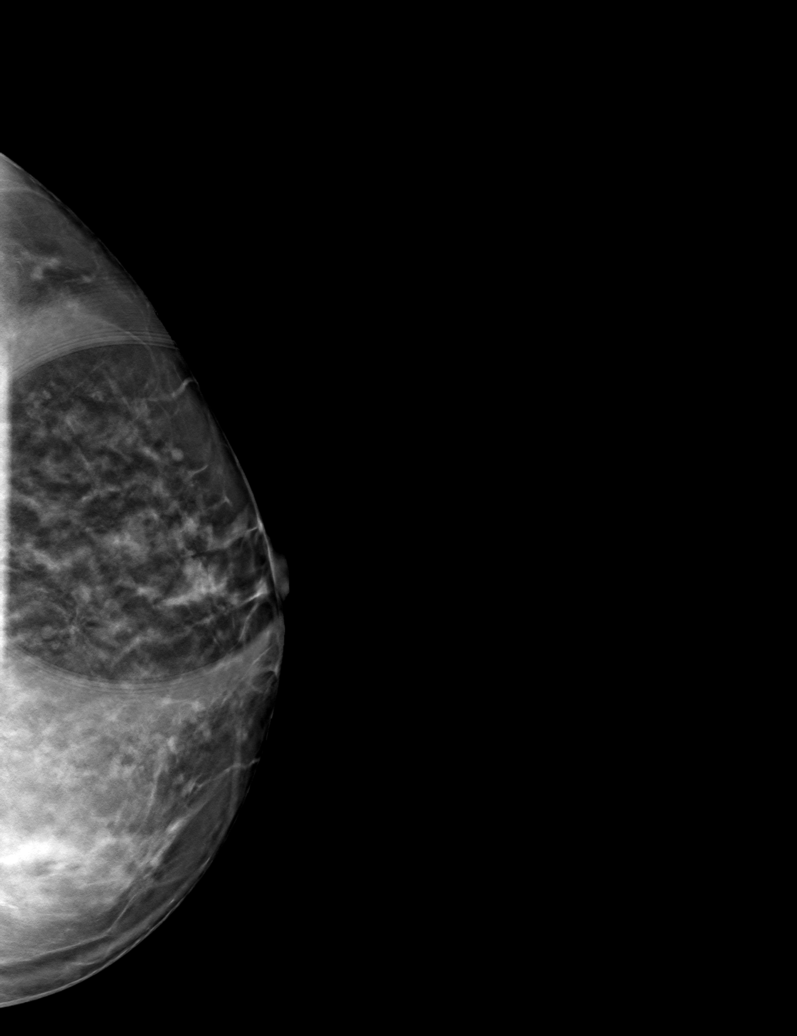

[6 of 30 positions shown; findings below may reference images not displayed]

ACR Breast Density Category c: The breast tissue is heterogeneously
dense, which may obscure small masses.
FINDINGS: Tomosynthesis and synthesized spot-compression CC and MLO views of
the areas of concern in the LEFT breast were obtained.

The 3 masses questioned on screening mammography are confirmed and
are as follows:

INNER breast at MIDDLE depth: Mass with vague margins associated
with possible architectural distortion measuring approximately
cm.

UPPER INNER QUADRANT at MIDDLE depth: Partially obscured mass whose
visible margins are circumscribed measuring approximately 0.4 cm
without associated architectural distortion or suspicious
calcifications.

UPPER OUTER subareolar location: Partially obscured mass whose
visible margins are circumscribed measuring approximately 0.8 cm
without associated architectural distortion or suspicious
calcifications.

Targeted LEFT breast ultrasound is performed, showing a hypoechoic
mass with vague and irregular margins and a hyperechoic halo at the
8:30 o'clock position approximately 5 cm from the nipple at MIDDLE
depth. The hypoechoic mass measures approximately 0.8 x 0.9 x
cm. Including the hyperechoic halo, the entirety of the mass
measures approximately 1.6 x 1.8 x 1.8 cm. The mass demonstrates
posterior acoustic shadowing and demonstrates internal power Doppler
flow. This correlates with the mammographic finding in the INNER
breast.

Benign focal duct ectasia is present at the 9 o'clock position
approximately 1 cm from the nipple measuring 0.3 x 0.5 x 0.5 cm.

A hypoechoic mass with mildly irregular margins is present at the 10
o'clock position approximately 3 cm from the nipple at MIDDLE depth
measuring approximately 0.3 x 0.2 x 0.4 cm, demonstrating no
posterior characteristics and no internal power Doppler flow. This
correlates with the mammographic finding in the UPPER INNER
QUADRANT.

A circumscribed oval parallel anechoic mass is present at the 1
o'clock position approximately 1 cm from the nipple at ANTERIOR
depth measuring approximately 0.4 x 0.6 x 0.7 cm, demonstrating
posterior acoustic enhancement and no internal power Doppler flow.
This correlates with the mammographic finding in the OUTER
subareolar location.

Sonographic evaluation of the LEFT axilla demonstrates no pathologic
lymphadenopathy.
IMPRESSION: 1. Suspicious mass involving the LOWER INNER QUADRANT of the LEFT
breast at the 8:30 o'clock position approximately 5 cm from the
nipple. The hypoechoic mass measures approximately 1.5 cm maximally.
Including the hyperechoic halo surrounding the mass, the maximum
measurement is approximately 1.8 cm.
2. Indeterminate approximate 0.4 cm mass in the UPPER INNER QUADRANT
of the LEFT breast at the 10 o'clock position approximately 3 cm
from nipple.
3. No pathologic LEFT axillary lymphadenopathy.
4. Benign cyst in the UPPER OUTER subareolar LEFT breast.

RECOMMENDATION:
Ultrasound-guided core needle biopsy of the suspicious mass in the
LOWER INNER QUADRANT of the LEFT breast and the indeterminate mass
in the UPPER INNER QUADRANT of the LEFT breast

The ultrasound core needle biopsy procedure was discussed with the
patient and her questions were answered. She wishes to proceed and
the biopsy has been scheduled at her convenience.

I have discussed the findings and recommendations with the patient.

BI-RADS CATEGORY  5: Highly suggestive of malignancy.

## 2019-12-08 IMAGING — US US BREAST*L* LIMITED INC AXILLA
1 series · 12 of 25 positions shown · non-contrast
Comparison: Previous exam(s).

CLINICAL DATA: Recall from screening mammography with
tomosynthesis, possible masses involving the LEFT breast. Family
history of breast cancer in mother at age 68 and in a maternal aunt.

EXAM:
DIGITAL DIAGNOSTIC LEFT MAMMOGRAM WITH TOMO
ULTRASOUND LEFT BREAST

[Series 1: us breast*left* limited inc axilla · 0.06mm/px · 12 of 34 slices shown]
[im 2/34]
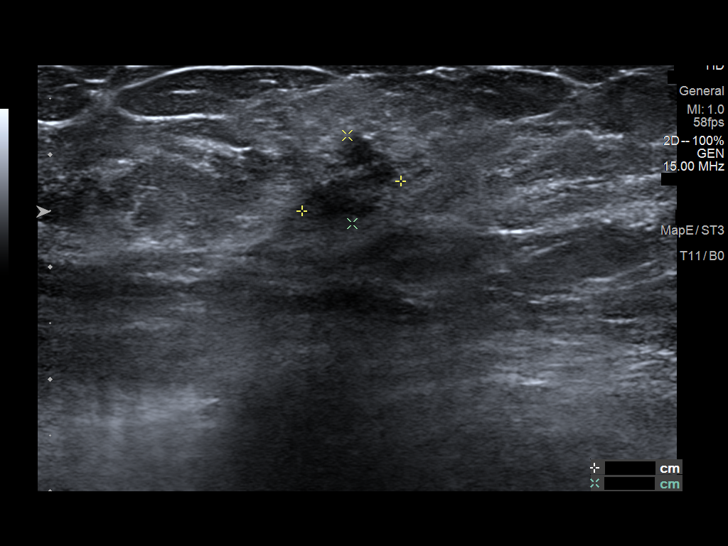
[im 5/34]
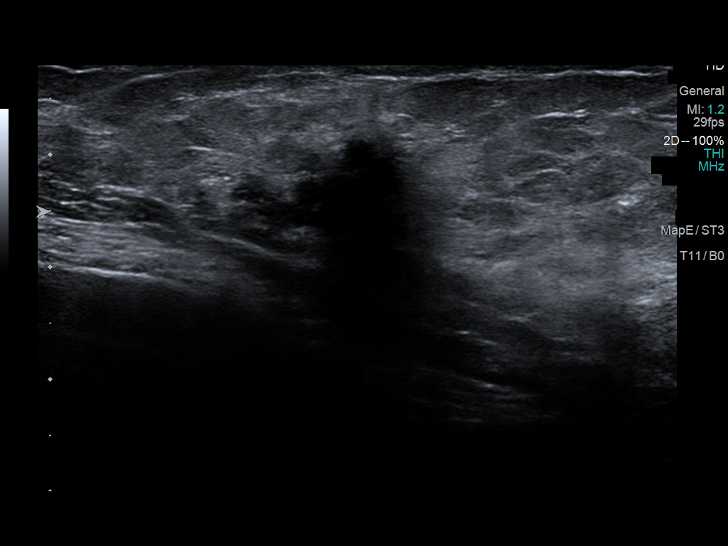
[im 7/34]
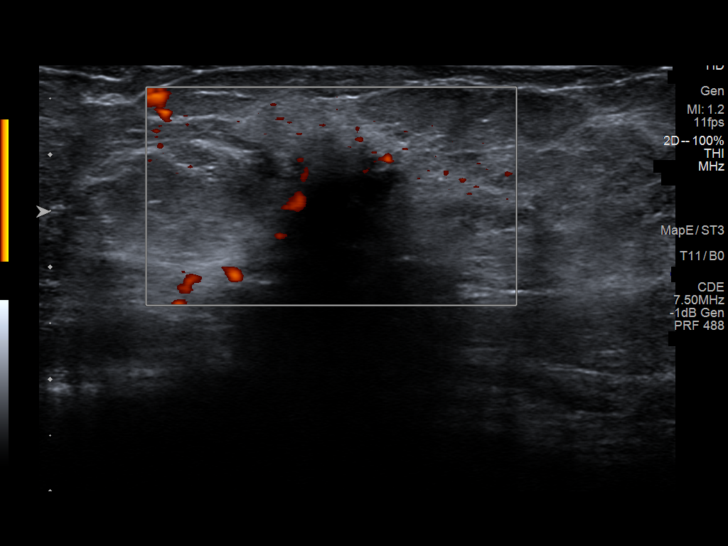
[im 10/34]
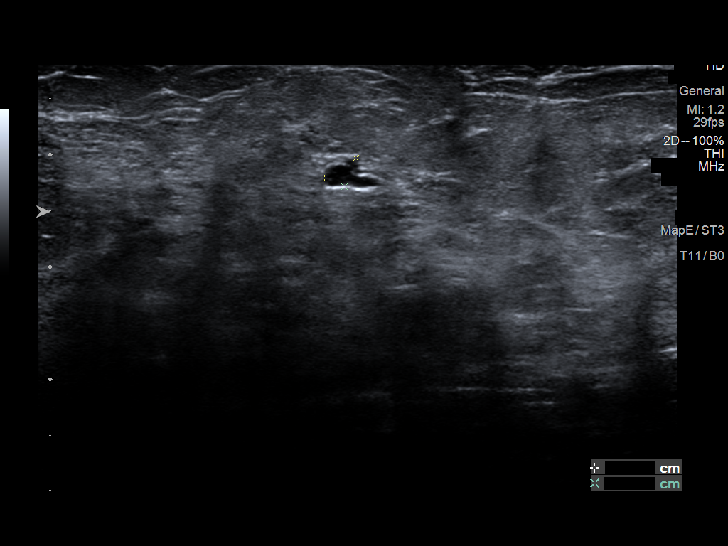
[im 13/34]
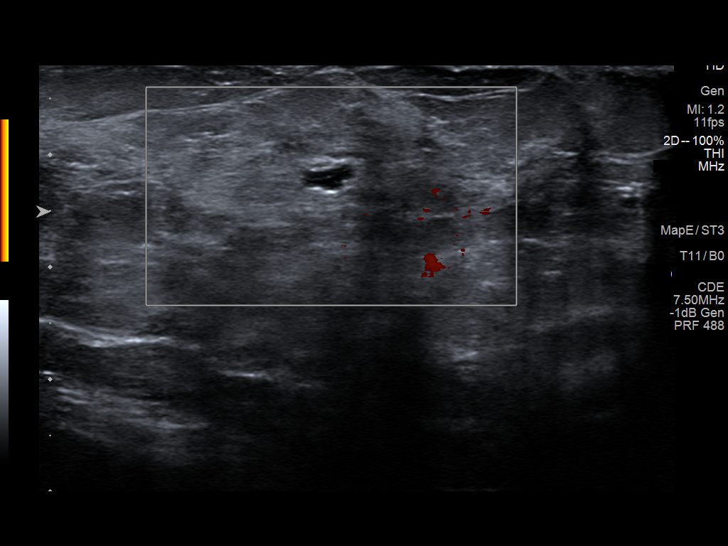
[im 16/34]
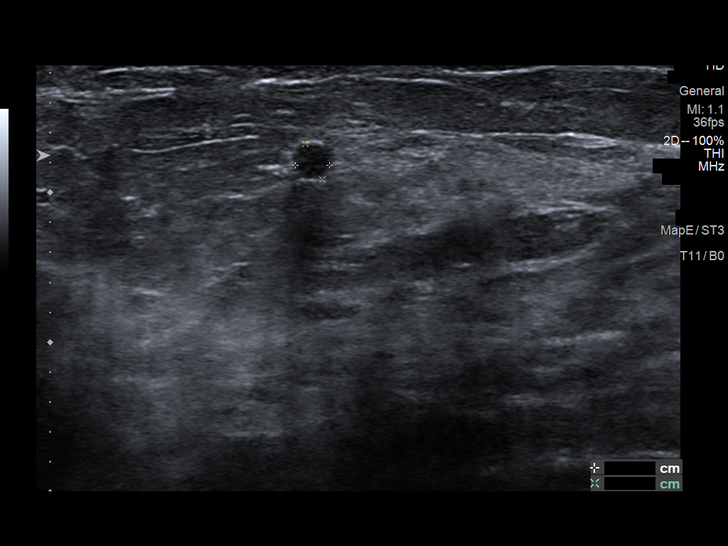
[im 18/34]
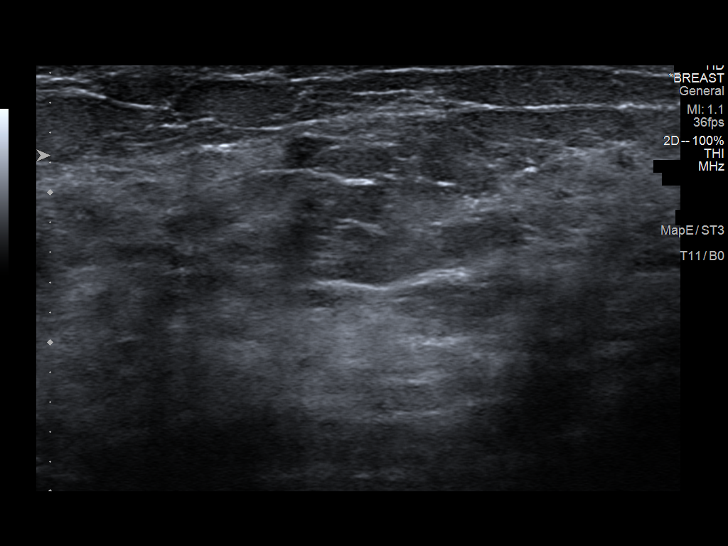
[im 21/34]
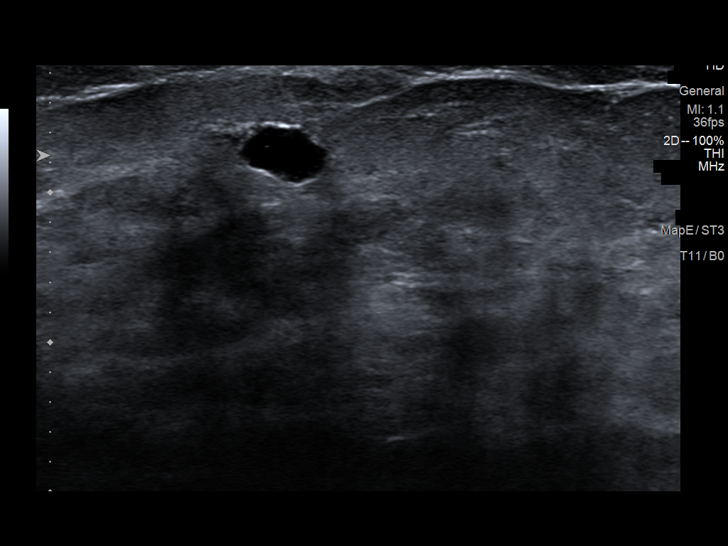
[im 24/34]
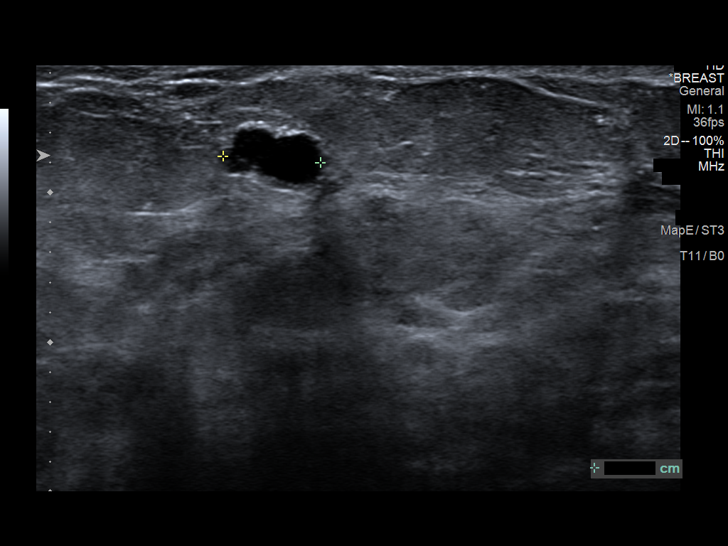
[im 27/34]
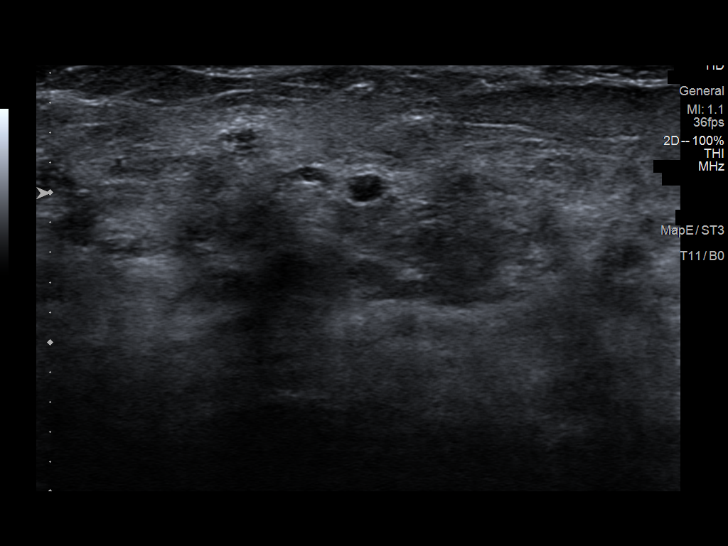
[im 29/34]
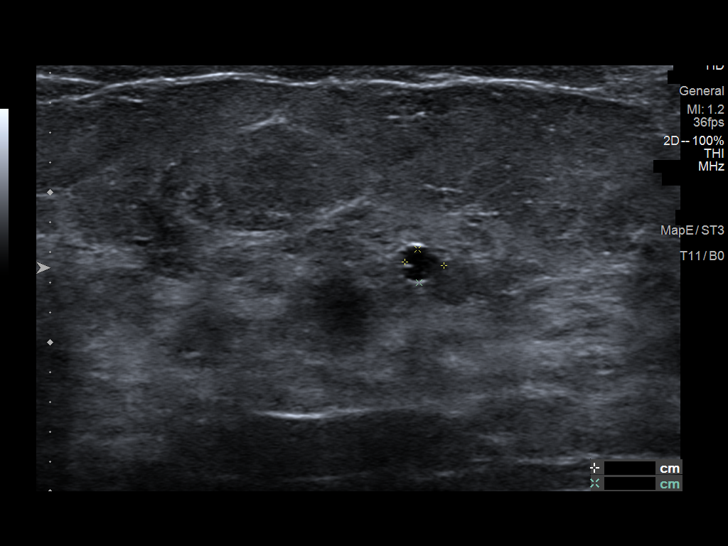
[im 32/34]
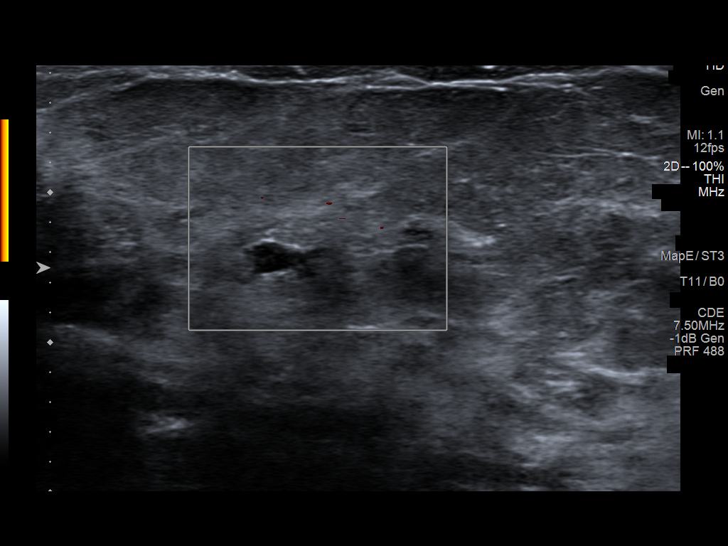

[12 of 25 positions shown; findings below may reference images not displayed]

ACR Breast Density Category c: The breast tissue is heterogeneously
dense, which may obscure small masses.
FINDINGS: Tomosynthesis and synthesized spot-compression CC and MLO views of
the areas of concern in the LEFT breast were obtained.

The 3 masses questioned on screening mammography are confirmed and
are as follows:

INNER breast at MIDDLE depth: Mass with vague margins associated
with possible architectural distortion measuring approximately
cm.

UPPER INNER QUADRANT at MIDDLE depth: Partially obscured mass whose
visible margins are circumscribed measuring approximately 0.4 cm
without associated architectural distortion or suspicious
calcifications.

UPPER OUTER subareolar location: Partially obscured mass whose
visible margins are circumscribed measuring approximately 0.8 cm
without associated architectural distortion or suspicious
calcifications.

Targeted LEFT breast ultrasound is performed, showing a hypoechoic
mass with vague and irregular margins and a hyperechoic halo at the
8:30 o'clock position approximately 5 cm from the nipple at MIDDLE
depth. The hypoechoic mass measures approximately 0.8 x 0.9 x
cm. Including the hyperechoic halo, the entirety of the mass
measures approximately 1.6 x 1.8 x 1.8 cm. The mass demonstrates
posterior acoustic shadowing and demonstrates internal power Doppler
flow. This correlates with the mammographic finding in the INNER
breast.

Benign focal duct ectasia is present at the 9 o'clock position
approximately 1 cm from the nipple measuring 0.3 x 0.5 x 0.5 cm.

A hypoechoic mass with mildly irregular margins is present at the 10
o'clock position approximately 3 cm from the nipple at MIDDLE depth
measuring approximately 0.3 x 0.2 x 0.4 cm, demonstrating no
posterior characteristics and no internal power Doppler flow. This
correlates with the mammographic finding in the UPPER INNER
QUADRANT.

A circumscribed oval parallel anechoic mass is present at the 1
o'clock position approximately 1 cm from the nipple at ANTERIOR
depth measuring approximately 0.4 x 0.6 x 0.7 cm, demonstrating
posterior acoustic enhancement and no internal power Doppler flow.
This correlates with the mammographic finding in the OUTER
subareolar location.

Sonographic evaluation of the LEFT axilla demonstrates no pathologic
lymphadenopathy.
IMPRESSION: 1. Suspicious mass involving the LOWER INNER QUADRANT of the LEFT
breast at the 8:30 o'clock position approximately 5 cm from the
nipple. The hypoechoic mass measures approximately 1.5 cm maximally.
Including the hyperechoic halo surrounding the mass, the maximum
measurement is approximately 1.8 cm.
2. Indeterminate approximate 0.4 cm mass in the UPPER INNER QUADRANT
of the LEFT breast at the 10 o'clock position approximately 3 cm
from nipple.
3. No pathologic LEFT axillary lymphadenopathy.
4. Benign cyst in the UPPER OUTER subareolar LEFT breast.

RECOMMENDATION:
Ultrasound-guided core needle biopsy of the suspicious mass in the
LOWER INNER QUADRANT of the LEFT breast and the indeterminate mass
in the UPPER INNER QUADRANT of the LEFT breast

The ultrasound core needle biopsy procedure was discussed with the
patient and her questions were answered. She wishes to proceed and
the biopsy has been scheduled at her convenience.

I have discussed the findings and recommendations with the patient.

BI-RADS CATEGORY  5: Highly suggestive of malignancy.

## 2019-12-14 ENCOUNTER — Ambulatory Visit
Admission: RE | Admit: 2019-12-14 | Discharge: 2019-12-14 | Disposition: A | Discharge status: Home / Self Care | Payer: BC Managed Care – PPO | Source: Ambulatory Visit | Attending: Nurse Practitioner | Admitting: Nurse Practitioner

## 2019-12-14 ENCOUNTER — Other Ambulatory Visit: Payer: Self-pay

## 2019-12-14 DIAGNOSIS — R928 Other abnormal and inconclusive findings on diagnostic imaging of breast: Secondary | ICD-10-CM

## 2019-12-14 IMAGING — MG MM BREAST LOCALIZATION CLIP
4 series · 4 of 12 positions shown · non-contrast
Comparison: Previous exam(s).

CLINICAL DATA: 53-year-old female underwent biopsy of two left
breast masses.

EXAM:
DIAGNOSTIC LEFT MAMMOGRAM POST ULTRASOUND BIOPSY

[L CC synth-2D]
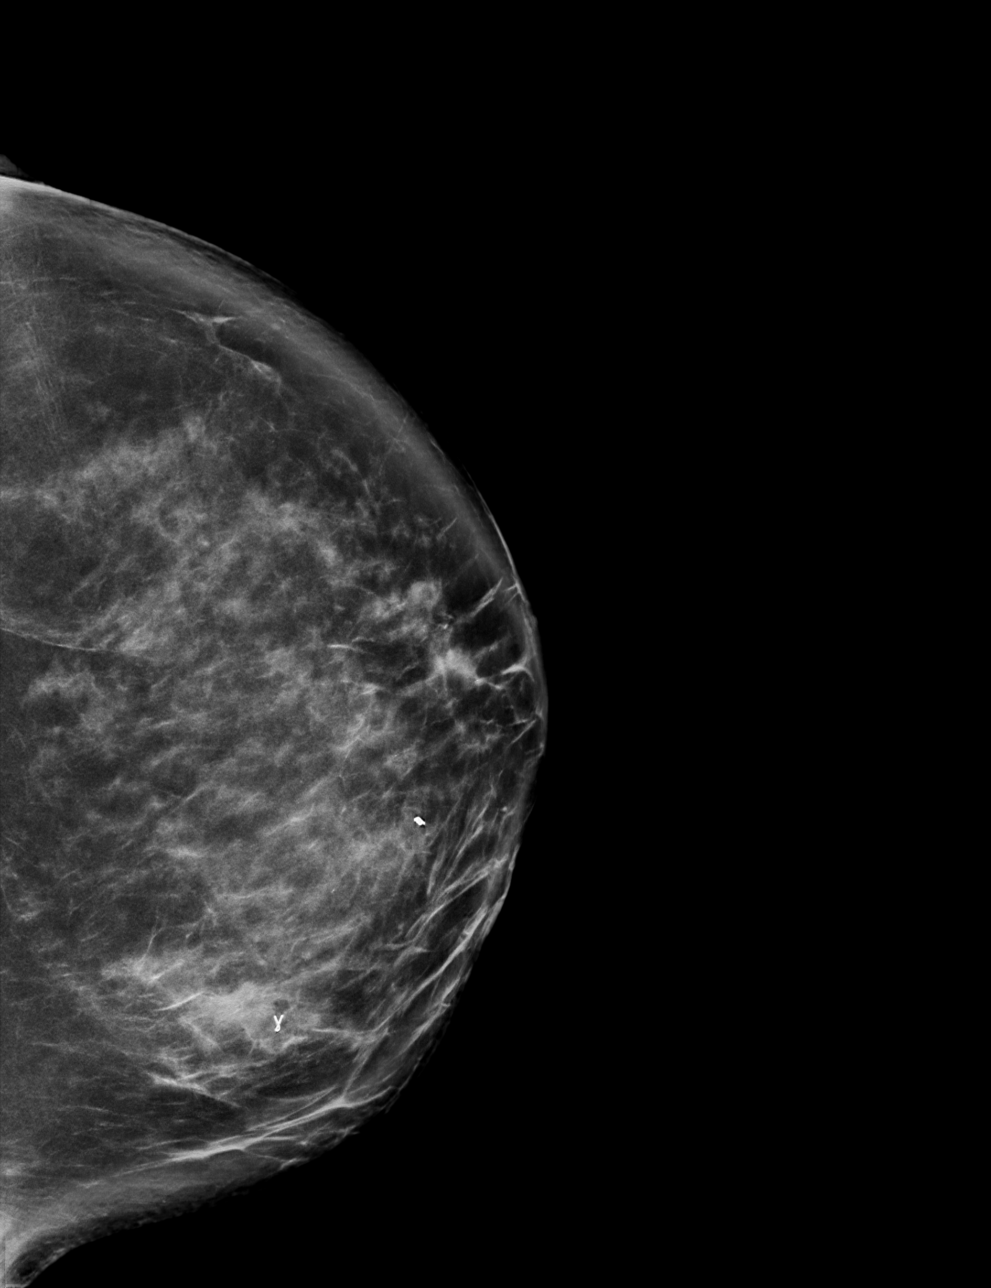

[L ML synth-2D]
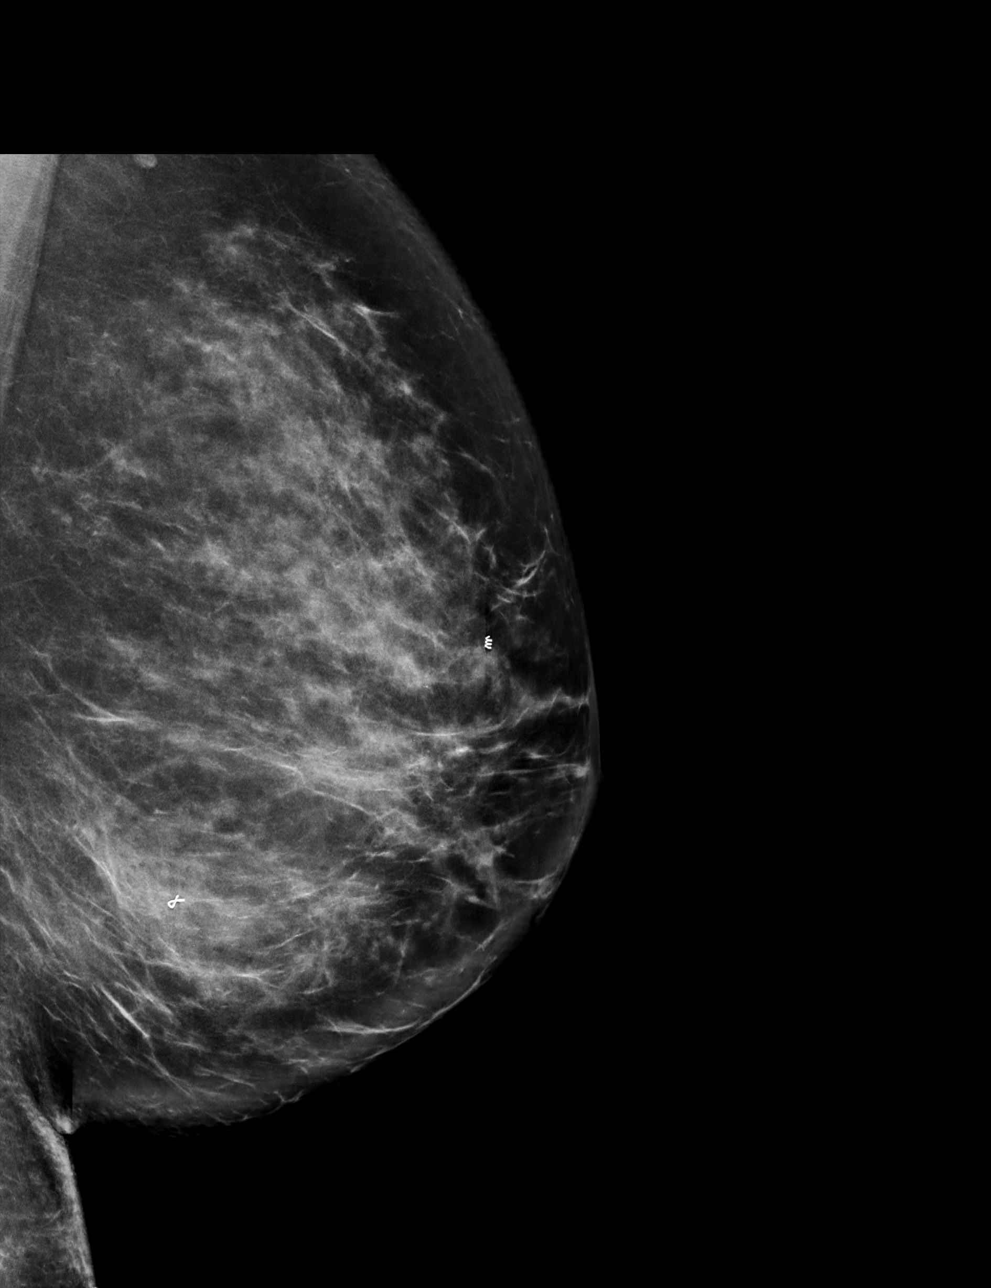

[L ML tomo · tomo slice 44/87.0]
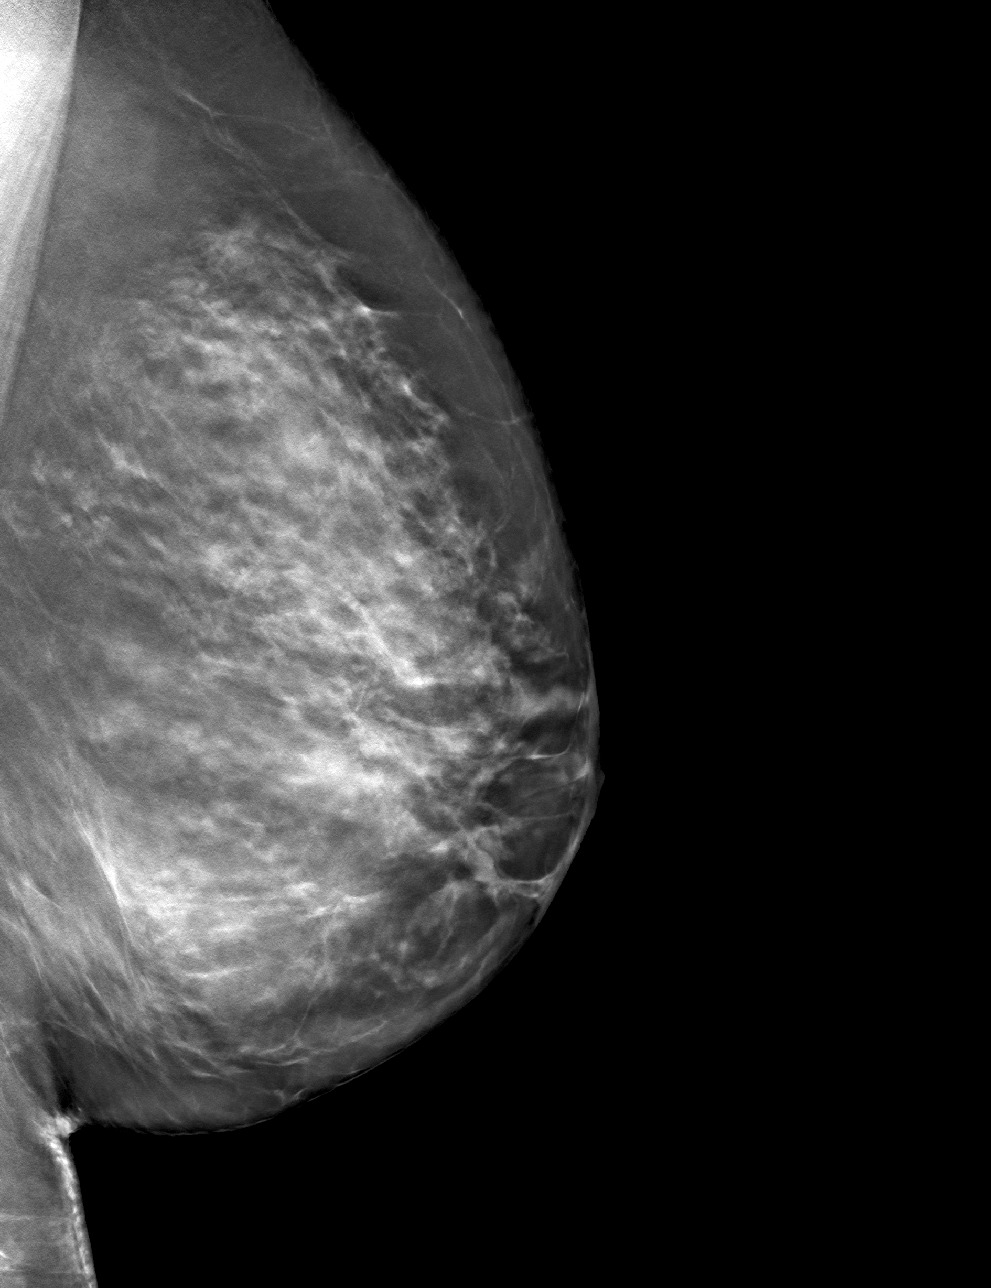

[L CC tomo · tomo slice 47/93.0]
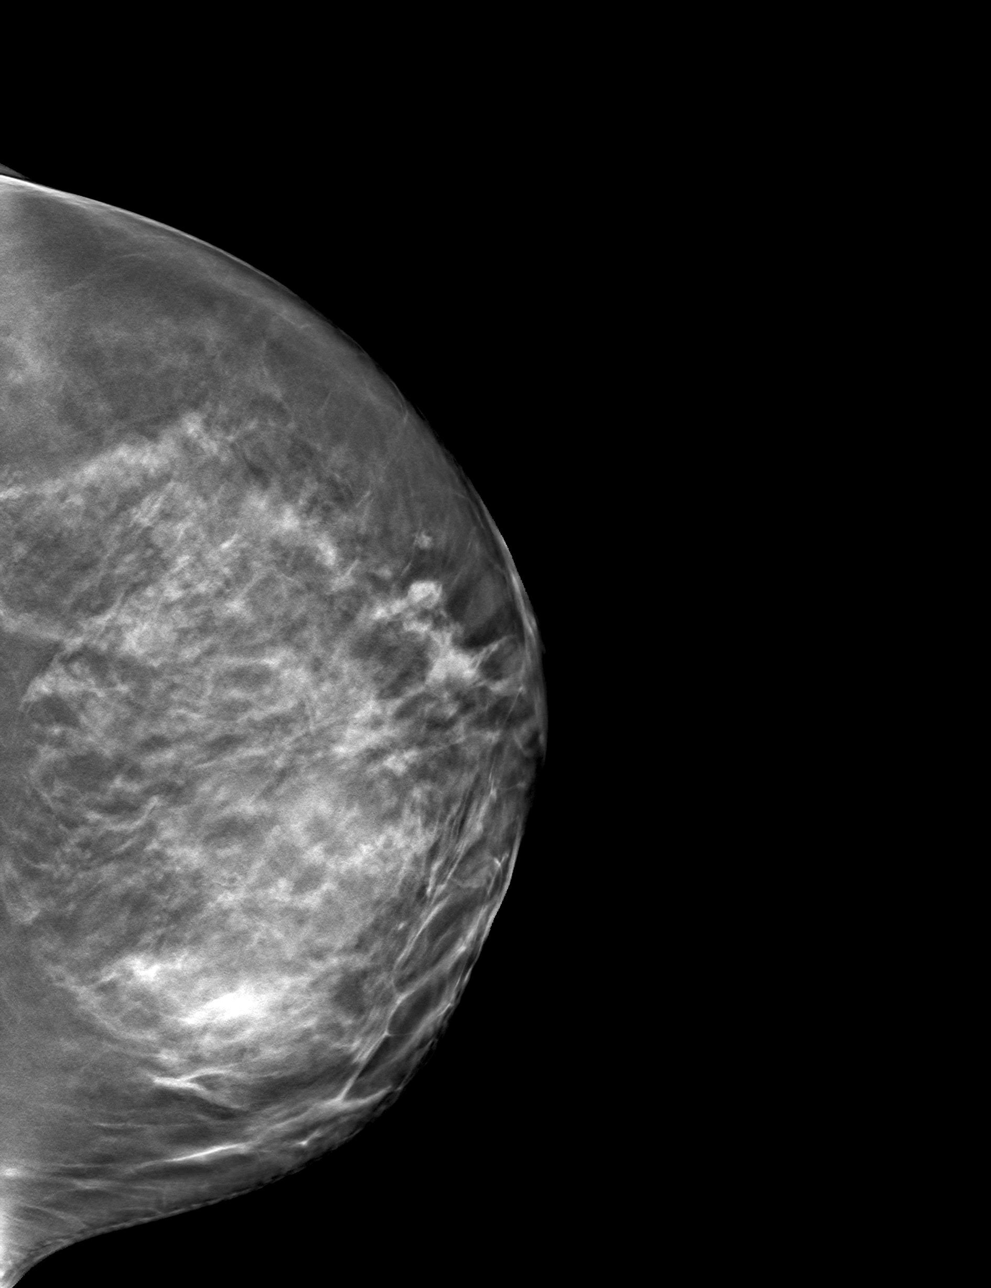

[4 of 12 positions shown; findings below may reference images not displayed]

FINDINGS: Mammographic images were obtained following ultrasound guided biopsy
of a left breast mass at 8:30 o'clock. The ribbon biopsy marking
clip is in expected position at the site of biopsy.

Mammographic images were obtained following ultrasound guided biopsy
of the left breast mass at 10 o'clock. The coil biopsy marking clip
is in expected position at the site of biopsy. The biopsied mass
appears to be separate from the mass identified in the inner left
breast on prior mammogram, which more likely corresponds to the
benign duct ectasia identified at 9 o'clock sonographically.
IMPRESSION: Appropriate positioning of the ribbon shaped biopsy marking clip at
the site of biopsy in the left breast at 8:30 o'clock.

Appropriate positioning of the coil shaped biopsy marking clip at
the site of biopsy in the left breast at 10 o'clock. The biopsied
mass appears to be different than the mass identified on mammogram
in the upper inner quadrant, which in review likely corresponds to
the benign duct ectasia present at 9 o'clock on ultrasound.

Given the multiple findings in the left breast, MRI is recommended
for further evaluation.

Final Assessment: Post Procedure Mammograms for Marker Placement

## 2019-12-14 IMAGING — US US BREAST BX W LOC DEV 1ST LESION IMG BX SPEC US GUIDE*L*
1 series · 12 of 25 positions shown · non-contrast
Comparison: Previous exam(s).
COMPARISON: Previous exam(s).

Addendum:
CLINICAL DATA: 53-year-old female presenting for biopsy of two left
breast masses.

EXAM:
ULTRASOUND GUIDED LEFT BREAST CORE NEEDLE BIOPSY x 2

[Series 1: us breast bx w loc dev 1st lesion img bx spec us g · 0.06mm/px · 12 of 28 slices shown]
[im 2/28]
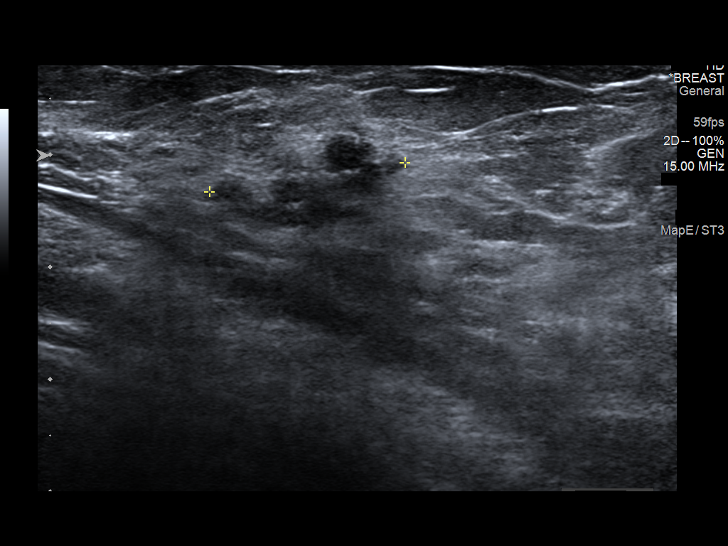
[im 4/28]
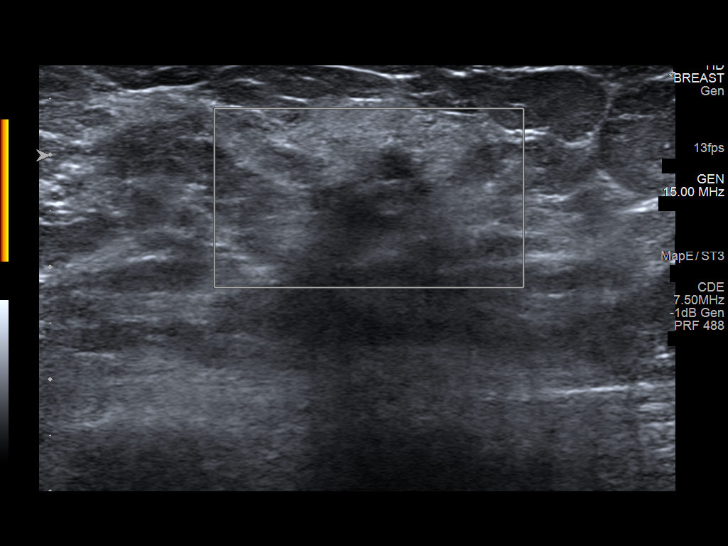
[im 6/28]
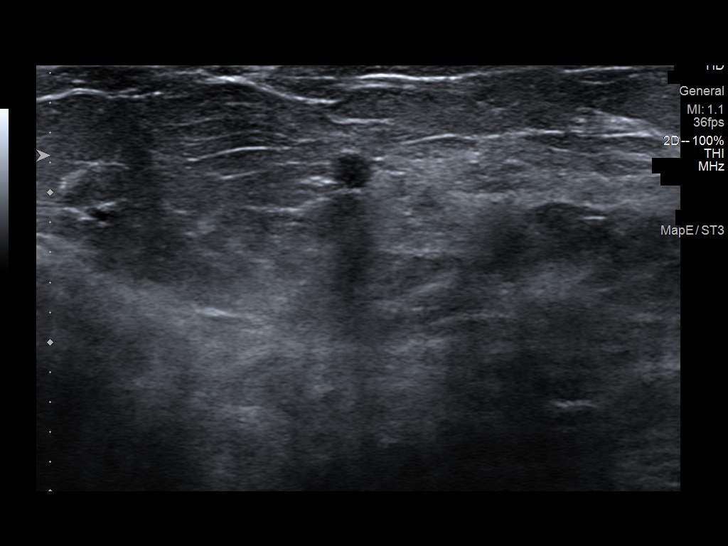
[im 8/28]
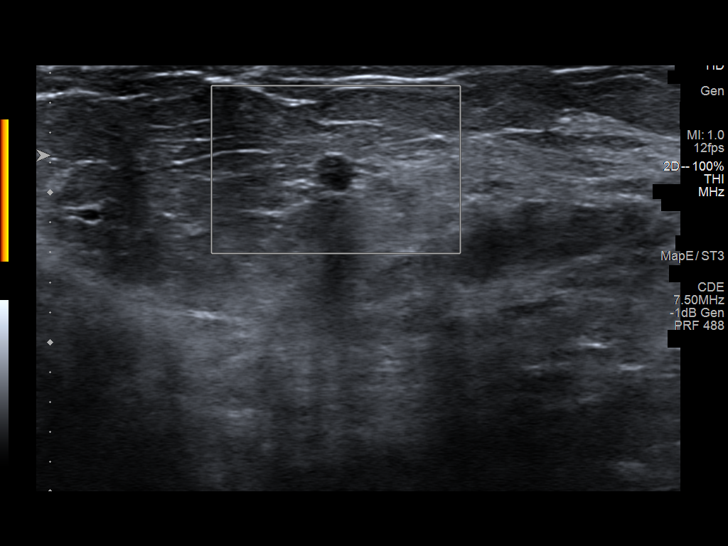
[im 11/28]
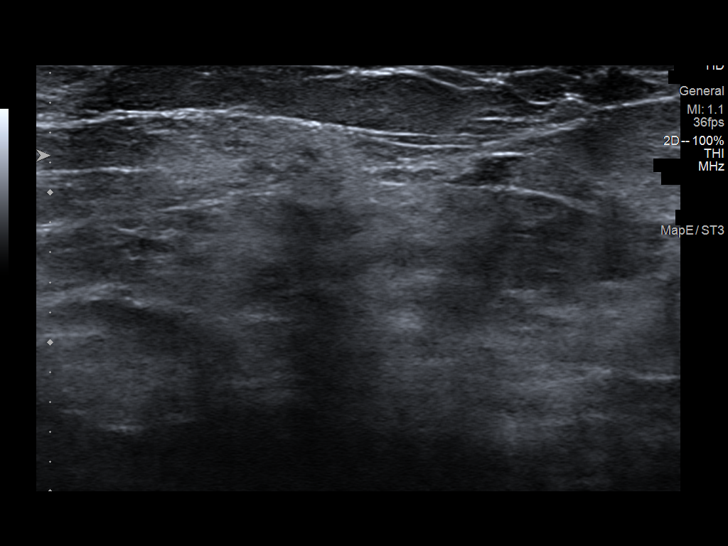
[im 13/28]
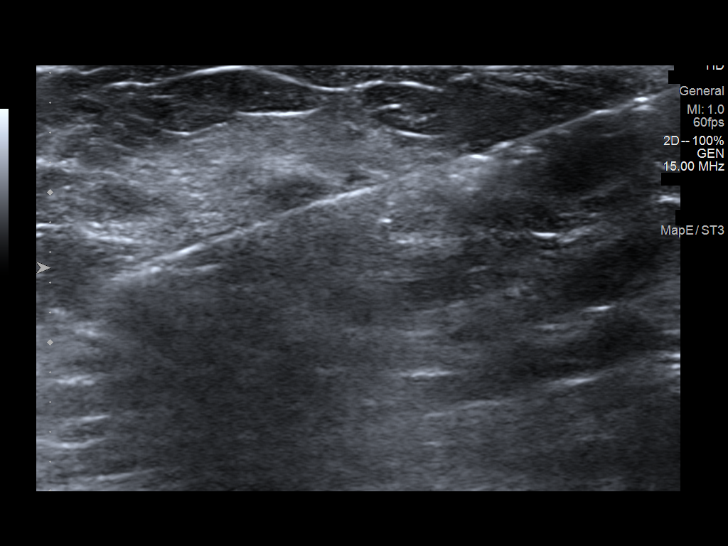
[im 15/28]
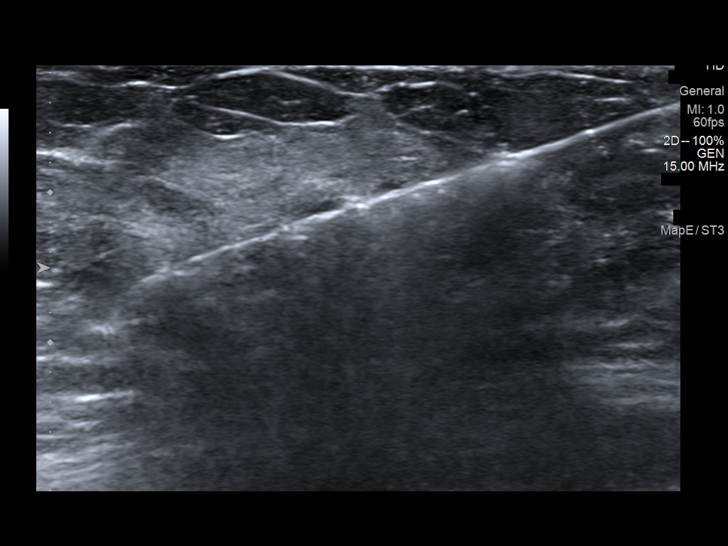
[im 17/28]
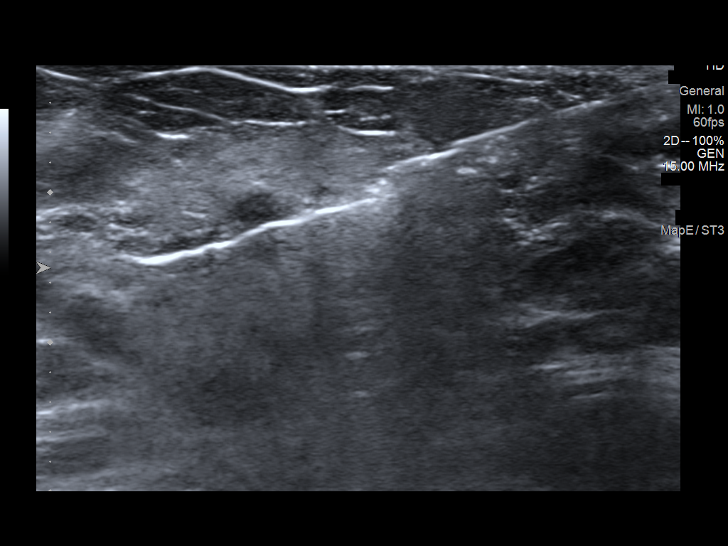
[im 20/28]
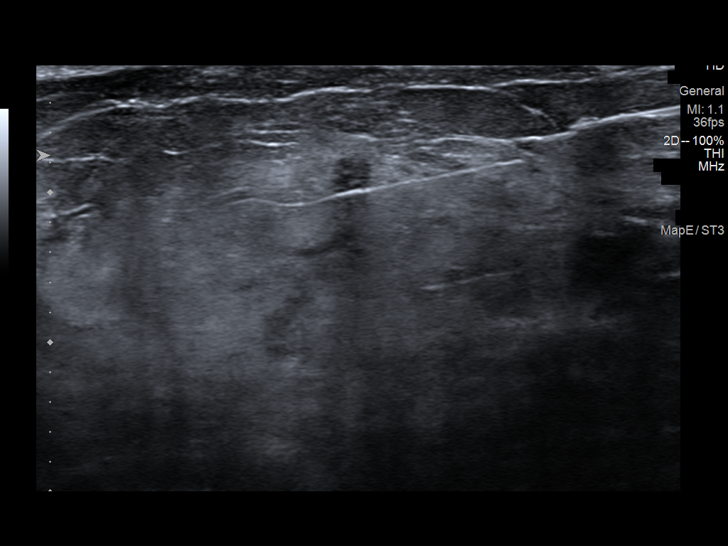
[im 22/28]
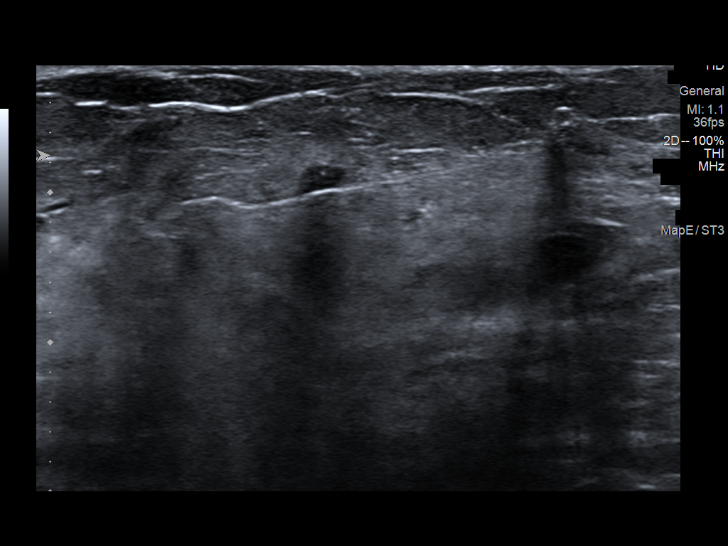
[im 24/28]
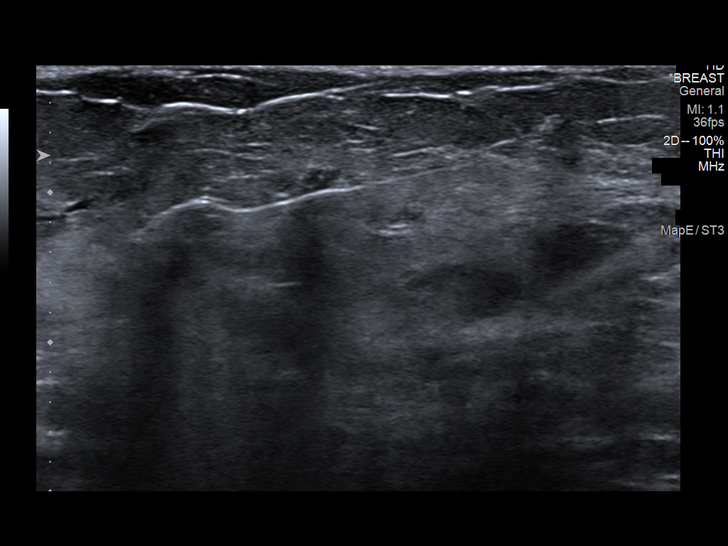
[im 26/28]
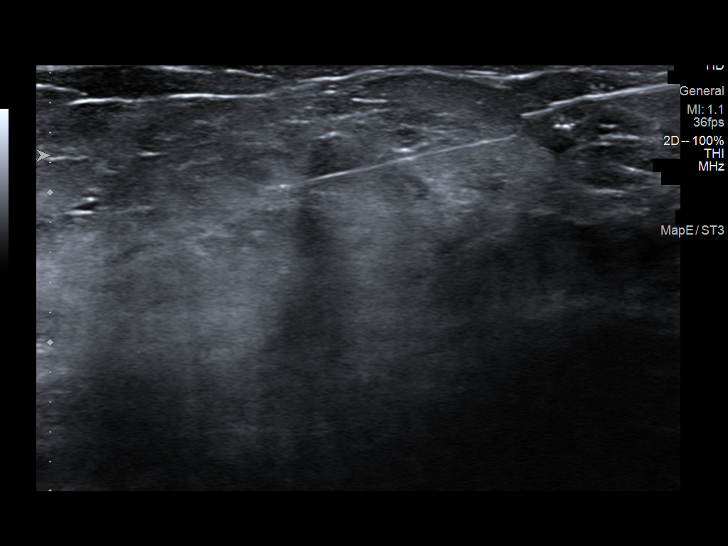

[12 of 25 positions shown; findings below may reference images not displayed]



1.  Lesion quadrant: Lower inner quadrant

Using sterile technique and 1% Lidocaine as local anesthetic, under
direct ultrasound visualization, a 12 gauge ING DJOUN device was
used to perform biopsy of a left breast mass at 8:30 o'clock using a
inferior approach. At the conclusion of the procedure ribbon tissue
marker clip was deployed into the biopsy cavity. Follow up 2 view
mammogram was performed and dictated separately.

2.  Lesion quadrant: Upper inner quadrant

Using sterile technique and 1% Lidocaine as local anesthetic, under
direct ultrasound visualization, a 14 gauge ING DJOUN device was
used to perform biopsy of a left breast mass at 10 o'clock using a
inferior approach. At the conclusion of the procedure a coil tissue
marker clip was deployed into the biopsy cavity. Follow up 2 view
mammogram was performed and dictated separately.
IMPRESSION: Ultrasound guided biopsy of a left breast mass at 8:30 o'clock and a
left breast mass at 10 o'clock. No apparent complications.

ADDENDUM:
Pathology revealed DUCTAL CARCINOMA IN SITU of the LEFT breast, 8:30
o'clock. The ductal carcinoma in situ has apocrine features. This
was found to be concordant by Dr. ING DJOUN.

Pathology revealed FIBROADENOMATOID NODULE WITH COLUMNAR CELLS
CHANGES of the LEFT breast, 10 o'clock. This was found to be
concordant by Dr. ING DJOUN.

Pathology results were discussed with the patient by telephone. The
patient reported doing well after the biopsies with tenderness at
the sites. Post biopsy instructions and care were reviewed and
questions were answered. The patient was encouraged to call The

Recommend breast MRI in this patient for extent of disease in the
left breast and full characterization of multiple other benign
appearing masses.

The patient was referred to [REDACTED]
[REDACTED] at [REDACTED] on
[DATE].

Pathology results reported by ING DJOUN RN on [DATE].



1.  Lesion quadrant: Lower inner quadrant

Using sterile technique and 1% Lidocaine as local anesthetic, under
direct ultrasound visualization, a 12 gauge ING DJOUN device was
used to perform biopsy of a left breast mass at 8:30 o'clock using a
inferior approach. At the conclusion of the procedure ribbon tissue
marker clip was deployed into the biopsy cavity. Follow up 2 view
mammogram was performed and dictated separately.

2.  Lesion quadrant: Upper inner quadrant

Using sterile technique and 1% Lidocaine as local anesthetic, under
direct ultrasound visualization, a 14 gauge ING DJOUN device was
used to perform biopsy of a left breast mass at 10 o'clock using a
inferior approach. At the conclusion of the procedure a coil tissue
marker clip was deployed into the biopsy cavity. Follow up 2 view
mammogram was performed and dictated separately.
IMPRESSION: Ultrasound guided biopsy of a left breast mass at 8:30 o'clock and a
left breast mass at 10 o'clock. No apparent complications.

## 2019-12-15 ENCOUNTER — Encounter: Payer: Self-pay | Admitting: *Deleted

## 2019-12-15 ENCOUNTER — Other Ambulatory Visit: Payer: Self-pay | Admitting: *Deleted

## 2019-12-15 DIAGNOSIS — D0512 Intraductal carcinoma in situ of left breast: Secondary | ICD-10-CM | POA: Insufficient documentation

## 2019-12-16 ENCOUNTER — Telehealth: Payer: Self-pay | Admitting: Oncology

## 2019-12-16 ENCOUNTER — Other Ambulatory Visit: Payer: Self-pay | Admitting: *Deleted

## 2019-12-16 DIAGNOSIS — D0512 Intraductal carcinoma in situ of left breast: Secondary | ICD-10-CM

## 2019-12-16 NOTE — Telephone Encounter (Signed)
Spoke with patient to confirm morning [facility] appointment for 1/27, packet will be mailed to patient

## 2019-12-21 ENCOUNTER — Encounter: Payer: Self-pay | Admitting: Oncology

## 2019-12-21 NOTE — Progress Notes (Signed)
[place]  [phone] [phone]     ID: Samanthamarie Ezzell Schultz DOB: [birth year]  MR#: [id number]  MWN#:[id number]  Patient Care Team: Vicenta Aly, [place] as PCP - General (Nurse Practitioner) Rockwell Germany, RN as Oncology Nurse Navigator Mauro Kaufmann, RN as Oncology Nurse Navigator Stark Klein, MD as Consulting Physician (General Surgery) Nehemyah Foushee, Virgie Dad, MD as Consulting Physician (Oncology) Kyung Rudd, MD as Consulting Physician (Radiation Oncology) Leandrew Koyanagi, MD as Attending Physician (Orthopedic Surgery) Chauncey Cruel, MD OTHER MD:  CHIEF COMPLAINT: 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 She underwent left diagnostic mammography with tomography and left breast ultrasonography at [facility] on 12/08/2019 showing: breast density category C; suspicious 1.5 cm mass involving the lower-inner quadrant of the left breast at 8:30, with a hyperechoic halo surrounding the mass for a total span of 1.8 cm; indeterminate 0.4 cm mass in the upper-inner quadrant of the left breast at 10 o'clock; no pathologic left axillary lymphadenopathy; benign cyst in the upper-outer subareolar left breast.  Accordingly on 12/14/2019 she proceeded to biopsy of the left breast areas in question. The pathology from this procedure ([id number]) showed:  1. Left breast, 8:30   - ductal carcinoma in situ, grade 3, with apocrine features (0.6 cm on biopsy)  - estrogen receptor, 0% negative and progesterone receptor, 0% negative.  2. Left breast, 10 o'clock  - fibroadenomatoid nodule with columnar cells changes  The patient's subsequent history is as detailed below.   INTERVAL HISTORY: Oaklie was evaluated in the multidisciplinary breast cancer clinic on  12/22/2019. Her case was also presented at the multidisciplinary breast cancer conference on the same day. At that time a preliminary plan was proposed: Consider breast MRI, likely breast conserving surgery followed by radiation, genetics, possible prophylactic antiestrogens   REVIEW OF SYSTEMS: There were no specific symptoms leading to the original mammogram, which was routinely scheduled. The patient denies unusual headaches, visual changes, nausea, vomiting, stiff neck, dizziness, or gait imbalance. There has been no cough, phlegm production, or pleurisy, no chest pain or pressure, and no change in bowel or bladder habits. The patient denies fever, rash, bleeding, unexplained fatigue or unexplained weight loss.  Paul exercises chiefly by walking.  A detailed review of systems was otherwise entirely negative.   PAST MEDICAL HISTORY: Past Medical History:  Diagnosis Date  . Anxiety   . Cancer [facility]) 2013   right knee  . Hypertension   . Trimalleolar fracture of ankle, closed, left, initial encounter     PAST SURGICAL HISTORY: Past Surgical History:  Procedure Laterality Date  . BREAST BIOPSY Right 10+ yrs ago   benign  . MELANOMA EXCISION Right 2013   knee  . ORIF ANKLE FRACTURE Left 05/06/2018   Procedure: OPEN REDUCTION INTERNAL FIXATION (ORIF) LEFT TRIMALLEOLAR ANKLE FRACTURE;  Surgeon: Leandrew Koyanagi, MD;  Location: [facility];  Service: Orthopedics;  Laterality: Left;    FAMILY HISTORY: Family History  Problem Relation Age of Onset  . Breast cancer Mother 37  . Diabetes Mother   . Colon cancer Mother   . Breast cancer Maternal Aunt   . Diabetes Father   . Hypertension Father   . Heart attack Father   . Arthritis Father   . Diabetes Brother   . Diabetes Brother   .  Breast cancer Cousin    The patient's father died at age 61 from congestive heart failure.  Patient's mother died from breast cancer at age 56, diagnosed age 42.. The patient denies a family hx  of ovarian cancer. She reports breast cancer in 2 maternal aunts and 2 maternal first cousins.  The patient has 2 brothers with no history of cancer.   GYNECOLOGIC HISTORY:  Patient's last menstrual period was 03/26/2016. Menarche: 54 years old [place] P 0 LMP 2017 HRT no  Hysterectomy? no BSO? 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  They mostly have trees in their [place] acres.  Her husband Octa Uplinger (the patient kept her name on her insurance) used to work as a Clinical cytogeneticist for [employer] but is now also retired.  They have no children and no pets.  The patient attends a local [place] DIRECTIVES: In the absence of any documents to the contrary the patient's husband is her healthcare power of attorney   HEALTH MAINTENANCE: Social History   Tobacco Use  . Smoking status: Never Smoker  . Smokeless tobacco: Never Used  Substance Use Topics  . Alcohol use: Never  . Drug use: Never     Colonoscopy: Never  PAP: 04/2016, negative  Bone density: Never   No Known Allergies  Current Outpatient Medications  Medication Sig Dispense Refill  . calcium-vitamin D (OSCAL WITH D) 500-200 MG-UNIT tablet Take 1 tablet by mouth 3 (three) times daily. 90 tablet 12  . meclizine (ANTIVERT) 25 MG tablet Take 1 tablet by mouth 3 (three) times daily as needed.    . valACYclovir (VALTREX) 1000 MG tablet Take 2 tablets by mouth 2 (two) times daily as needed.    . venlafaxine XR (EFFEXOR-XR) 150 MG 24 hr capsule Take 1 capsule by mouth daily.  0  . zinc sulfate 220 (50 Zn) MG capsule Take 1 capsule (220 mg total) by mouth daily. 42 capsule 0   No current facility-administered medications for this visit.    OBJECTIVE: Middle-aged white woman in no acute distress  Vitals:   12/22/19 0843  BP: 133/78  Pulse: 73  Resp: 16  Temp: (!) 97.3 F (36.3 C)  SpO2: 100%     Body mass index is 27.45 kg/m.   Wt  Readings from Last 3 Encounters:  12/22/19 159 lb 14.4 oz (72.5 kg)  05/06/18 150 lb (68 kg)  05/01/18 148 lb (67.1 kg)      ECOG FS:1 - Symptomatic but completely ambulatory  Ocular: Sclerae unicteric, pupils round and equal Ear-nose-throat: Wearing a mask Lymphatic: No cervical or supraclavicular adenopathy Lungs no rales or rhonchi Heart regular rate and rhythm Abd soft, nontender, positive bowel sounds MSK no focal spinal tenderness, no joint edema Neuro: non-focal, well-oriented, appropriate affect Breasts: The right breast is unremarkable.  The left breast is status post recent biopsy.  There is a moderate ecchymosis.  There is no palpable mass and no nipple change.  Both axillae are benign.   LAB RESULTS:  CMP     Component Value Date/Time   NA 142 12/22/2019 0827   K 3.7 12/22/2019 0827   CL 110 12/22/2019 0827   CO2 24 12/22/2019 0827   GLUCOSE 101 (H) 12/22/2019 0827   BUN 7 12/22/2019 0827   CREATININE 0.90 12/22/2019 0827   CALCIUM 8.7 (L) 12/22/2019 0827   PROT 7.1 12/22/2019 0827  ALBUMIN 3.9 12/22/2019 0827   AST 18 12/22/2019 0827   ALT 16 12/22/2019 0827   ALKPHOS 100 12/22/2019 0827   BILITOT 0.3 12/22/2019 0827   GFRNONAA >60 12/22/2019 0827   GFRAA >60 12/22/2019 0827    No results found for: Ronnald Ramp, A1GS, A2GS, BETS, BETA2SER, GAMS, MSPIKE, SPEI  Lab Results  Component Value Date   WBC 4.5 12/22/2019   NEUTROABS 2.1 12/22/2019   HGB 12.4 12/22/2019   HCT 37.6 12/22/2019   MCV 93.1 12/22/2019   PLT 298 12/22/2019    No results found for: LABCA2  No components found for: [id number]  No results for input(s): INR in the last 168 hours.  No results found for: LABCA2  No results found for: WUJ811  No results found for: BJY782  No results found for: NFA213  No results found for: CA2729  No components found for: HGQUANT  No results found for: CEA1 / No results found for: CEA1   No results found for:  AFPTUMOR  No results found for: CHROMOGRNA  No results found for: KPAFRELGTCHN, LAMBDASER, KAPLAMBRATIO (kappa/lambda light chains)  No results found for: HGBA, HGBA2QUANT, HGBFQUANT, HGBSQUAN (Hemoglobinopathy evaluation)   No results found for: LDH  No results found for: IRON, TIBC, IRONPCTSAT (Iron and TIBC)  No results found for: FERRITIN  Urinalysis No results found for: COLORURINE, APPEARANCEUR, LABSPEC, PHURINE, GLUCOSEU, HGBUR, BILIRUBINUR, KETONESUR, PROTEINUR, UROBILINOGEN, NITRITE, LEUKOCYTESUR   STUDIES: US BREAST LTD UNI LEFT INC AXILLA  Result Date: 12/08/2019 CLINICAL DATA:  Recall from screening mammography with tomosynthesis, possible masses involving the LEFT breast. Family history of breast cancer in mother at age 1 and in a maternal aunt. EXAM: DIGITAL DIAGNOSTIC LEFT MAMMOGRAM WITH TOMO ULTRASOUND LEFT BREAST COMPARISON:  Previous exam(s). ACR Breast Density Category c: The breast tissue is heterogeneously dense, which may obscure small masses. FINDINGS: Tomosynthesis and synthesized spot-compression CC and MLO views of the areas of concern in the LEFT breast were obtained. The 3 masses questioned on screening mammography are confirmed and are as follows: INNER breast at MIDDLE depth: Mass with vague margins associated with possible architectural distortion measuring approximately 1.5 cm. UPPER INNER QUADRANT at MIDDLE depth: Partially obscured mass whose visible margins are circumscribed measuring approximately 0.4 cm without associated architectural distortion or suspicious calcifications. UPPER OUTER subareolar location: Partially obscured mass whose visible margins are circumscribed measuring approximately 0.8 cm without associated architectural distortion or suspicious calcifications. Targeted LEFT breast ultrasound is performed, showing a hypoechoic mass with vague and irregular margins and a hyperechoic halo at the 8:30 o'clock position approximately 5 cm from the  nipple at MIDDLE depth. The hypoechoic mass measures approximately 0.8 x 0.9 x 1.5 cm. Including the hyperechoic halo, the entirety of the mass measures approximately 1.6 x 1.8 x 1.8 cm. The mass demonstrates posterior acoustic shadowing and demonstrates internal power Doppler flow. This correlates with the mammographic finding in the INNER breast. Benign focal duct ectasia is present at the 9 o'clock position approximately 1 cm from the nipple measuring 0.3 x 0.5 x 0.5 cm. A hypoechoic mass with mildly irregular margins is present at the 10 o'clock position approximately 3 cm from the nipple at MIDDLE depth measuring approximately 0.3 x 0.2 x 0.4 cm, demonstrating no posterior characteristics and no internal power Doppler flow. This correlates with the mammographic finding in the [place]. A circumscribed oval parallel anechoic mass is present at the 1 o'clock position approximately 1 cm from the nipple at ANTERIOR depth measuring  approximately 0.4 x 0.6 x 0.7 cm, demonstrating posterior acoustic enhancement and no internal power Doppler flow. This correlates with the mammographic finding in the OUTER subareolar location. Sonographic evaluation of the LEFT axilla demonstrates no pathologic lymphadenopathy. IMPRESSION: 1. Suspicious mass involving the LOWER INNER QUADRANT of the LEFT breast at the 8:30 o'clock position approximately 5 cm from the nipple. The hypoechoic mass measures approximately 1.5 cm maximally. Including the hyperechoic halo surrounding the mass, the maximum measurement is approximately 1.8 cm. 2. Indeterminate approximate 0.4 cm mass in the UPPER INNER QUADRANT of the LEFT breast at the 10 o'clock position approximately 3 cm from nipple. 3. No pathologic LEFT axillary lymphadenopathy. 4. Benign cyst in the UPPER OUTER subareolar LEFT breast. RECOMMENDATION: Ultrasound-guided core needle biopsy of the suspicious mass in the LOWER INNER QUADRANT of the LEFT breast and the indeterminate  mass in the UPPER INNER QUADRANT of the LEFT breast The ultrasound core needle biopsy procedure was discussed with the patient and her questions were answered. She wishes to proceed and the biopsy has been scheduled at her convenience. I have discussed the findings and recommendations with the patient. BI-RADS CATEGORY  5: Highly suggestive of malignancy. Electronically Signed   By: Evangeline Dakin M.D.   On: 12/08/2019 15:39   MM DIAG BREAST TOMO UNI LEFT  Result Date: 12/08/2019 CLINICAL DATA:  Recall from screening mammography with tomosynthesis, possible masses involving the LEFT breast. Family history of breast cancer in mother at age 42 and in a maternal aunt. EXAM: DIGITAL DIAGNOSTIC LEFT MAMMOGRAM WITH TOMO ULTRASOUND LEFT BREAST COMPARISON:  Previous exam(s). ACR Breast Density Category c: The breast tissue is heterogeneously dense, which may obscure small masses. FINDINGS: Tomosynthesis and synthesized spot-compression CC and MLO views of the areas of concern in the LEFT breast were obtained. The 3 masses questioned on screening mammography are confirmed and are as follows: INNER breast at MIDDLE depth: Mass with vague margins associated with possible architectural distortion measuring approximately 1.5 cm. UPPER INNER QUADRANT at MIDDLE depth: Partially obscured mass whose visible margins are circumscribed measuring approximately 0.4 cm without associated architectural distortion or suspicious calcifications. UPPER OUTER subareolar location: Partially obscured mass whose visible margins are circumscribed measuring approximately 0.8 cm without associated architectural distortion or suspicious calcifications. Targeted LEFT breast ultrasound is performed, showing a hypoechoic mass with vague and irregular margins and a hyperechoic halo at the 8:30 o'clock position approximately 5 cm from the nipple at MIDDLE depth. The hypoechoic mass measures approximately 0.8 x 0.9 x 1.5 cm. Including the hyperechoic  halo, the entirety of the mass measures approximately 1.6 x 1.8 x 1.8 cm. The mass demonstrates posterior acoustic shadowing and demonstrates internal power Doppler flow. This correlates with the mammographic finding in the INNER breast. Benign focal duct ectasia is present at the 9 o'clock position approximately 1 cm from the nipple measuring 0.3 x 0.5 x 0.5 cm. A hypoechoic mass with mildly irregular margins is present at the 10 o'clock position approximately 3 cm from the nipple at MIDDLE depth measuring approximately 0.3 x 0.2 x 0.4 cm, demonstrating no posterior characteristics and no internal power Doppler flow. This correlates with the mammographic finding in the [place]. A circumscribed oval parallel anechoic mass is present at the 1 o'clock position approximately 1 cm from the nipple at ANTERIOR depth measuring approximately 0.4 x 0.6 x 0.7 cm, demonstrating posterior acoustic enhancement and no internal power Doppler flow. This correlates with the mammographic finding in the OUTER subareolar location.  Sonographic evaluation of the LEFT axilla demonstrates no pathologic lymphadenopathy. IMPRESSION: 1. Suspicious mass involving the LOWER INNER QUADRANT of the LEFT breast at the 8:30 o'clock position approximately 5 cm from the nipple. The hypoechoic mass measures approximately 1.5 cm maximally. Including the hyperechoic halo surrounding the mass, the maximum measurement is approximately 1.8 cm. 2. Indeterminate approximate 0.4 cm mass in the UPPER INNER QUADRANT of the LEFT breast at the 10 o'clock position approximately 3 cm from nipple. 3. No pathologic LEFT axillary lymphadenopathy. 4. Benign cyst in the UPPER OUTER subareolar LEFT breast. RECOMMENDATION: Ultrasound-guided core needle biopsy of the suspicious mass in the LOWER INNER QUADRANT of the LEFT breast and the indeterminate mass in the UPPER INNER QUADRANT of the LEFT breast The ultrasound core needle biopsy procedure was discussed  with the patient and her questions were answered. She wishes to proceed and the biopsy has been scheduled at her convenience. I have discussed the findings and recommendations with the patient. BI-RADS CATEGORY  5: Highly suggestive of malignancy. Electronically Signed   By: Evangeline Dakin M.D.   On: 12/08/2019 15:39   MM 3D SCREEN BREAST BILATERAL  Result Date: 12/01/2019 CLINICAL DATA:  Screening. EXAM: DIGITAL SCREENING BILATERAL MAMMOGRAM WITH TOMO AND CAD COMPARISON:  Previous exam(s). ACR Breast Density Category c: The breast tissue is heterogeneously dense, which may obscure small masses. FINDINGS: In the left breast, possible masses warrant further evaluation. In the right breast, no findings suspicious for malignancy. Images were processed with CAD. IMPRESSION: Further evaluation is suggested for possible masses in the left breast. RECOMMENDATION: Diagnostic mammogram and possibly ultrasound of the left breast. (Code:FI-L-20M) The patient will be contacted regarding the findings, and additional imaging will be scheduled. BI-RADS CATEGORY  0: Incomplete. Need additional imaging evaluation and/or prior mammograms for comparison. Electronically Signed   By: Lovey Newcomer M.D.   On: 12/01/2019 14:41   MM CLIP PLACEMENT LEFT  Result Date: 12/14/2019 CLINICAL DATA:  54 year old female underwent biopsy of two left breast masses. EXAM: DIAGNOSTIC LEFT MAMMOGRAM POST ULTRASOUND BIOPSY COMPARISON:  Previous exam(s). FINDINGS: Mammographic images were obtained following ultrasound guided biopsy of a left breast mass at 8:30 o'clock. The ribbon biopsy marking clip is in expected position at the site of biopsy. Mammographic images were obtained following ultrasound guided biopsy of the left breast mass at 10 o'clock. The coil biopsy marking clip is in expected position at the site of biopsy. The biopsied mass appears to be separate from the mass identified in the inner left breast on prior mammogram, which more  likely corresponds to the benign duct ectasia identified at 9 o'clock sonographically. IMPRESSION: Appropriate positioning of the ribbon shaped biopsy marking clip at the site of biopsy in the left breast at 8:30 o'clock. Appropriate positioning of the coil shaped biopsy marking clip at the site of biopsy in the left breast at 10 o'clock. The biopsied mass appears to be different than the mass identified on mammogram in the upper inner quadrant, which in review likely corresponds to the benign duct ectasia present at 9 o'clock on ultrasound. Given the multiple findings in the left breast, MRI is recommended for further evaluation. Final Assessment: Post Procedure Mammograms for Marker Placement Electronically Signed   By: Audie Pinto M.D.   On: 12/14/2019 09:17   [place] LT BREAST BX W LOC DEV 1ST LESION IMG BX SPEC US GUIDE  Addendum Date: 12/15/2019   ADDENDUM REPORT: 12/15/2019 13:28 ADDENDUM: Pathology revealed DUCTAL CARCINOMA IN SITU of the LEFT  breast, 8:30 o'clock. 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 This was found to be concordant by Dr. Audie Pinto. 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 This was found to be concordant by Dr. Audie Pinto. Pathology results were discussed with the patient by telephone. The patient reported doing well after the biopsies with tenderness at the sites. Post biopsy instructions and care were reviewed and questions were answered. The patient was encouraged to call [facility] for any additional concerns. Recommend breast MRI in this patient for extent of disease in the left breast and full characterization of multiple other benign appearing masses. The patient was referred to [facility] on December 22, 2019. Pathology results reported by Stacie Acres RN on 12/15/2019. Electronically  Signed   By: Audie Pinto M.D.   On: 12/15/2019 13:28   Result Date: 12/15/2019 CLINICAL DATA:  54 year old female presenting for biopsy of two left breast masses. EXAM: ULTRASOUND GUIDED LEFT BREAST CORE NEEDLE BIOPSY x 2 COMPARISON:  Previous exam(s). FINDINGS: I met with the patient and we discussed the procedure of ultrasound-guided biopsy, including benefits and alternatives. We discussed the high likelihood of a successful procedure. We discussed the risks of the procedure, including infection, bleeding, tissue injury, clip migration, and inadequate sampling. Informed written consent was given. The usual time-out protocol was performed immediately prior to the procedure. 1.  Lesion quadrant: Lower inner quadrant Using sterile technique and 1% Lidocaine as local anesthetic, under direct ultrasound visualization, a 12 gauge spring-loaded device was used to perform biopsy of a left breast mass at 8:30 o'clock using a inferior approach. At the conclusion of the procedure ribbon tissue marker clip was deployed into the biopsy cavity. Follow up 2 view mammogram was performed and dictated separately. 2.  Lesion quadrant: Upper inner quadrant Using sterile technique and 1% Lidocaine as local anesthetic, under direct ultrasound visualization, a 14 gauge spring-loaded device was used to perform biopsy of a left breast mass at 10 o'clock using a inferior approach. At the conclusion of the procedure a coil tissue marker clip was deployed into the biopsy cavity. Follow up 2 view mammogram was performed and dictated separately. IMPRESSION: Ultrasound guided biopsy of a left breast mass at 8:30 o'clock and a left breast mass at 10 o'clock. No apparent complications. Electronically Signed: By: Audie Pinto M.D. On: 12/14/2019 09:21   [place] LT BREAST BX W LOC DEV EA ADD LESION IMG BX SPEC US GUIDE  Addendum Date: 12/15/2019   ADDENDUM REPORT: 12/15/2019 13:28 ADDENDUM: Pathology revealed DUCTAL CARCINOMA IN SITU  of the LEFT breast, 8:30 o'clock. 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 This was found to be concordant by Dr. Audie Pinto. 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 This was found to be concordant by Dr. Audie Pinto. Pathology results were discussed with the patient by telephone. The patient reported doing well after the biopsies with tenderness at the sites. Post biopsy instructions and care were reviewed and questions were answered. The patient was encouraged to call [facility] for any additional concerns. Recommend breast MRI in this patient for extent of disease in the left breast and full characterization of multiple other benign appearing masses. The patient was referred to [facility] on December 22, 2019.  Pathology results reported by Stacie Acres RN on 12/15/2019. Electronically Signed   By: Audie Pinto M.D.   On: 12/15/2019 13:28   Result Date: 12/15/2019 CLINICAL DATA:  54 year old female presenting for biopsy of two left breast masses. EXAM: ULTRASOUND GUIDED LEFT BREAST CORE NEEDLE BIOPSY x 2 COMPARISON:  Previous exam(s). FINDINGS: I met with the patient and we discussed the procedure of ultrasound-guided biopsy, including benefits and alternatives. We discussed the high likelihood of a successful procedure. We discussed the risks of the procedure, including infection, bleeding, tissue injury, clip migration, and inadequate sampling. Informed written consent was given. The usual time-out protocol was performed immediately prior to the procedure. 1.  Lesion quadrant: Lower inner quadrant Using sterile technique and 1% Lidocaine as local anesthetic, under direct ultrasound visualization, a 12 gauge spring-loaded device was used to perform biopsy of a left breast mass at 8:30 o'clock using a inferior approach. At  the conclusion of the procedure ribbon tissue marker clip was deployed into the biopsy cavity. Follow up 2 view mammogram was performed and dictated separately. 2.  Lesion quadrant: Upper inner quadrant Using sterile technique and 1% Lidocaine as local anesthetic, under direct ultrasound visualization, a 14 gauge spring-loaded device was used to perform biopsy of a left breast mass at 10 o'clock using a inferior approach. At the conclusion of the procedure a coil tissue marker clip was deployed into the biopsy cavity. Follow up 2 view mammogram was performed and dictated separately. IMPRESSION: Ultrasound guided biopsy of a left breast mass at 8:30 o'clock and a left breast mass at 10 o'clock. No apparent complications. Electronically Signed: By: Audie Pinto M.D. On: 12/14/2019 09:21     ELIGIBLE FOR AVAILABLE RESEARCH PROTOCOL: No  ASSESSMENT: 54 y.o. [place] woman status post left breast biopsy 12/14/2019 for ductal carcinoma in situ, clinically 1.8 cm, grade 3, estrogen and progesterone receptor negative  (1) genetics testing  (2) definitive surgery pending  (3) adjuvant radiation  (4) consider prophylactic antiestrogens  PLAN: I met today with Starlynn to review her new diagnosis. Specifically we discussed the biology of her breast cancer, its diagnosis, staging, treatment  options and prognosis. Vernesha understands that in noninvasive ductal carcinoma, also called ductal carcinoma in situ ("DCIS") the breast cancer cells remain trapped in the ducts were they started. They cannot travel to a vital organ. For that reason these cancers in themselves are not life-threatening.  If the whole breast is removed then all the ducts are removed and since the cancer cells are trapped in the ducts, the cure rate with mastectomy for noninvasive breast cancer is approximately 99%. Nevertheless we recommend lumpectomy, because there is no survival advantage to mastectomy and because the cosmetic result  is generally superior with breast conservation.  Since the patient is keeping her breast, there will be some risk of recurrence. The recurrence can only be in the same breast since, again, the cells are trapped in the ducts. There is no connection from one breast to the other. The risk of local recurrence is cut by more than half with radiation, which is standard in this situation.  Because [name] as breast cancer is estrogen and progesterone receptor negative there is no indication for antiestrogens in terms of treatment.  However she may consider antiestrogens for prevention of future breast cancers.  We will discuss this when she returns to see me in approximately 3 months.  Jamaiya does qualify for genetics testing. In patients who carry a deleterious mutation [for example in  a  BRCA gene], the risk of a new breast cancer developing in the future may be sufficiently great that the patient may choose bilateral mastectomies. However if she wishes to keep her breasts in that situation it is safe to do so. That would require intensified screening, which generally means not only yearly mammography but a yearly breast MRI as well.   Zeniya is aware that she is behind on her health maintenance.  She has never had a colonoscopy.  She has not had a formal skin exam by a dermatologist in more than 5 years despite her history of melanoma.  When she returns to see me we will discuss updating these.  This is a good year to do it since she will have met her deductible through the costs of the surgery she is about to undergo  In summary the overall plan is for genetics testing, surgery, followed by radiation, then a discussion of anti-estrogens.  Forrest has a good understanding of the overall plan. She agrees with it. She knows the goal of treatment in her case is cure. She will call with any problems that may develop before her next visit here.  Total encounter time 50 minutes.Chauncey Cruel, MD   12/22/2019  11:19 AM Medical Oncology and Hematology [facility] [place], [place] [address] Tel. [phone]    Fax. [phone]   This document serves as a record of services personally performed by Lurline Del, MD. It was created on his behalf by Wilburn Mylar, a trained medical scribe. The creation of this record is based on the scribe's personal observations and the provider's statements to them.   I, Lurline Del MD, have reviewed the above documentation for accuracy and completeness, and I agree with the above.    *Total Encounter Time as defined by the Centers for Medicare and Medicaid Services includes, in addition to the face-to-face time of a patient visit (documented in the note above) non-face-to-face time: obtaining and reviewing outside history, ordering and reviewing medications, tests or procedures, care coordination (communications with other health care professionals or caregivers) and documentation in the medical record.

## 2019-12-22 ENCOUNTER — Inpatient Hospital Stay: Payer: BC Managed Care – PPO

## 2019-12-22 ENCOUNTER — Ambulatory Visit: Payer: BC Managed Care – PPO | Admitting: Physical Therapy

## 2019-12-22 ENCOUNTER — Encounter: Payer: Self-pay | Admitting: Radiation Oncology

## 2019-12-22 ENCOUNTER — Encounter: Payer: Self-pay | Admitting: Oncology

## 2019-12-22 ENCOUNTER — Encounter: Payer: Self-pay | Admitting: Genetic Counselor

## 2019-12-22 ENCOUNTER — Other Ambulatory Visit: Payer: Self-pay | Admitting: *Deleted

## 2019-12-22 ENCOUNTER — Inpatient Hospital Stay: Payer: BC Managed Care – PPO | Attending: Oncology | Admitting: Oncology

## 2019-12-22 ENCOUNTER — Ambulatory Visit
Admission: RE | Admit: 2019-12-22 | Discharge: 2019-12-22 | Disposition: A | Discharge status: Home / Self Care | Payer: BC Managed Care – PPO | Source: Ambulatory Visit | Attending: Radiation Oncology | Admitting: Radiation Oncology

## 2019-12-22 ENCOUNTER — Other Ambulatory Visit: Payer: Self-pay

## 2019-12-22 ENCOUNTER — Ambulatory Visit (HOSPITAL_BASED_OUTPATIENT_CLINIC_OR_DEPARTMENT_OTHER): Payer: BC Managed Care – PPO | Admitting: Genetic Counselor

## 2019-12-22 VITALS — BP 133/78 | HR 73 | Temp 97.3°F | Resp 16 | Ht 64.0 in | Wt 159.9 lb

## 2019-12-22 DIAGNOSIS — D0512 Intraductal carcinoma in situ of left breast: Secondary | ICD-10-CM

## 2019-12-22 DIAGNOSIS — Z171 Estrogen receptor negative status [ER-]: Secondary | ICD-10-CM | POA: Diagnosis not present

## 2019-12-22 DIAGNOSIS — Z79899 Other long term (current) drug therapy: Secondary | ICD-10-CM | POA: Diagnosis not present

## 2019-12-22 DIAGNOSIS — Z85820 Personal history of malignant melanoma of skin: Secondary | ICD-10-CM | POA: Insufficient documentation

## 2019-12-22 DIAGNOSIS — Z803 Family history of malignant neoplasm of breast: Secondary | ICD-10-CM | POA: Insufficient documentation

## 2019-12-22 DIAGNOSIS — I10 Essential (primary) hypertension: Secondary | ICD-10-CM | POA: Insufficient documentation

## 2019-12-22 DIAGNOSIS — Z800 Family history of malignant neoplasm of digestive organs: Secondary | ICD-10-CM

## 2019-12-22 DIAGNOSIS — C4371 Malignant melanoma of right lower limb, including hip: Secondary | ICD-10-CM

## 2019-12-22 DIAGNOSIS — Z808 Family history of malignant neoplasm of other organs or systems: Secondary | ICD-10-CM | POA: Insufficient documentation

## 2019-12-22 DIAGNOSIS — F419 Anxiety disorder, unspecified: Secondary | ICD-10-CM | POA: Diagnosis not present

## 2019-12-22 LAB — CMP (CANCER CENTER ONLY)
ALT: 16 U/L (ref 0–44)
AST: 18 U/L (ref 15–41)
Albumin: 3.9 g/dL (ref 3.5–5.0)
Alkaline Phosphatase: 100 U/L (ref 38–126)
Anion gap: 8 (ref 5–15)
BUN: 7 mg/dL (ref 6–20)
CO2: 24 mmol/L (ref 22–32)
Calcium: 8.7 mg/dL — ABNORMAL LOW (ref 8.9–10.3)
Chloride: 110 mmol/L (ref 98–111)
Creatinine: 0.9 mg/dL (ref 0.44–1.00)
GFR, Est AFR Am: >60 mL/min (ref >60)
GFR, Estimated: >60 mL/min (ref >60)
Glucose, Bld: 101 mg/dL — ABNORMAL HIGH (ref 70–99)
Potassium: 3.7 mmol/L (ref 3.5–5.1)
Sodium: 142 mmol/L (ref 135–145)
Total Bilirubin: 0.3 mg/dL (ref 0.3–1.2)
Total Protein: 7.1 g/dL (ref 6.5–8.1)

## 2019-12-22 LAB — CBC WITH DIFFERENTIAL (CANCER CENTER ONLY)
Abs Immature Granulocytes: 0.01 10*3/uL (ref 0.00–0.07)
Basophils Absolute: 0 10*3/uL (ref 0.0–0.1)
Basophils Relative: 1 %
Eosinophils Absolute: 0.1 10*3/uL (ref 0.0–0.5)
Eosinophils Relative: 3 %
HCT: 37.6 % (ref 36.0–46.0)
Hemoglobin: 12.4 g/dL (ref 12.0–15.0)
Immature Granulocytes: 0 %
Lymphocytes Relative: 42 %
Lymphs Abs: 1.9 10*3/uL (ref 0.7–4.0)
MCH: 30.7 pg (ref 26.0–34.0)
MCHC: 33 g/dL (ref 30.0–36.0)
MCV: 93.1 fL (ref 80.0–100.0)
Monocytes Absolute: 0.3 10*3/uL (ref 0.1–1.0)
Monocytes Relative: 8 %
Neutro Abs: 2.1 10*3/uL (ref 1.7–7.7)
Neutrophils Relative %: 46 %
Platelet Count: 298 10*3/uL (ref 150–400)
RBC: 4.04 MIL/uL (ref 3.87–5.11)
RDW: 13.2 % (ref 11.5–15.5)
WBC Count: 4.5 10*3/uL (ref 4.0–10.5)
nRBC: 0 % (ref 0.0–0.2)

## 2019-12-22 LAB — GENETIC SCREENING ORDER

## 2019-12-22 NOTE — Progress Notes (Signed)
Radiation Oncology         [phone] ________________________________  Name: Sherri Schultz        MRN: [id number]  Date of Service: 12/22/2019 DOB: 1966/07/31  CC:Vicenta Aly, FNP  Stark Klein, MD     REFERRING PHYSICIAN: Stark Klein, MD   DIAGNOSIS: The encounter diagnosis was Ductal carcinoma in situ (DCIS) of left breast.   HISTORY OF PRESENT ILLNESS: Sherri Schultz is a 54 y.o. female seen in the multidisciplinary breast clinic for a new diagnosis of left breast cancer. The patient was noted to have a screening detected abnormality in the left breast. Diagnostic imaging revealed up to 3 lesions in the breast. This morning in conference the third lesion did not persist on diagnostic imaging, but there was a 1.8 x 1.8 x 1.6 cm lesion at 8:30, and at 10:00 a 4 x 3 x 2 mm lesion. The axilla was negative for adenopathy. She proceeded with a biopsy on 12/14/19 and at 10:00 the lesion was a fibroadenoma, and at 8:30, High Grade DCIS was noted, ER/PR was negative. She's seen today to discuss recommendations for treatment of her cancer.    PREVIOUS RADIATION THERAPY: No   PAST MEDICAL HISTORY:  Past Medical History:  Diagnosis Date  . Anxiety   . Cancer [facility]) 2013   right knee  . Hypertension   . Trimalleolar fracture of ankle, closed, left, initial encounter        PAST SURGICAL HISTORY: Past Surgical History:  Procedure Laterality Date  . BREAST BIOPSY Right 10+ yrs ago   benign  . MELANOMA EXCISION Right 2013   knee  . ORIF ANKLE FRACTURE Left 05/06/2018   Procedure: OPEN REDUCTION INTERNAL FIXATION (ORIF) LEFT TRIMALLEOLAR ANKLE FRACTURE;  Surgeon: Leandrew Koyanagi, MD;  Location: [facility];  Service: Orthopedics;  Laterality: Left;     FAMILY HISTORY:  Family History  Problem Relation Age of Onset  . Breast cancer Mother 57  . Diabetes Mother   . Colon cancer Mother   . Breast cancer Maternal Aunt   . Diabetes Father   . Hypertension  Father   . Heart attack Father   . Arthritis Father   . Diabetes Brother   . Diabetes Brother   . Breast cancer Cousin      SOCIAL HISTORY:  reports that she has never smoked. She has never used smokeless tobacco. She reports that she does not drink alcohol or use drugs. The patient is single and lives in [place]. She enjoys landscaping her house.   ALLERGIES: Patient has no known allergies.   MEDICATIONS:  Current Outpatient Medications  Medication Sig Dispense Refill  . calcium-vitamin D (OSCAL WITH D) 500-200 MG-UNIT tablet Take 1 tablet by mouth 3 (three) times daily. 90 tablet 12  . meclizine (ANTIVERT) 25 MG tablet Take 1 tablet by mouth 3 (three) times daily as needed.    . valACYclovir (VALTREX) 1000 MG tablet Take 2 tablets by mouth 2 (two) times daily as needed.    . venlafaxine XR (EFFEXOR-XR) 150 MG 24 hr capsule Take 1 capsule by mouth daily.  0  . zinc sulfate 220 (50 Zn) MG capsule Take 1 capsule (220 mg total) by mouth daily. 42 capsule 0   No current facility-administered medications for this encounter.     REVIEW OF SYSTEMS: On review of systems, the patient reports that she is doing well overall. She reports she is very nervous about her diagnosis after witnessing her  mother's battle with stage IV breast cancer and synchronous colon cancer. She denies any chest pain, shortness of breath, cough, fevers, chills, night sweats, unintended weight changes. She denies any bowel or bladder disturbances, and denies abdominal pain, nausea or vomiting. She denies any new musculoskeletal or joint aches or pains. A complete review of systems is obtained and is otherwise negative.     PHYSICAL EXAM:  Wt Readings from Last 3 Encounters:  12/22/19 159 lb 14.4 oz (72.5 kg)  05/06/18 150 lb (68 kg)  05/01/18 148 lb (67.1 kg)   Temp Readings from Last 3 Encounters:  12/22/19 (!) 97.3 F (36.3 C) (Temporal)  05/06/18 97.6 F (36.4 C)  05/01/18 97.9 F (36.6 C) (Oral)    BP Readings from Last 3 Encounters:  12/22/19 133/78  05/06/18 132/76  05/01/18 115/78   Pulse Readings from Last 3 Encounters:  12/22/19 73  05/06/18 94  05/01/18 70   In general this is a well appearing caucasian female in no acute distress. She's alert and oriented x4 and appropriate throughout the examination. Cardiopulmonary assessment is negative for acute distress and she exhibits normal effort. Bilateral breast exam is deferred.   ECOG = 0  0 - Asymptomatic (Fully active, able to carry on all predisease activities without restriction)  1 - Symptomatic but completely ambulatory (Restricted in physically strenuous activity but ambulatory and able to carry out work of a light or sedentary nature. For example, light housework, office work)  2 - Symptomatic, <50% in bed during the day (Ambulatory and capable of all self care but unable to carry out any work activities. Up and about more than 50% of waking hours)  3 - Symptomatic, >50% in bed, but not bedbound (Capable of only limited self-care, confined to bed or chair 50% or more of waking hours)  4 - Bedbound (Completely disabled. Cannot carry on any self-care. Totally confined to bed or chair)  5 - Death   Eustace Pen MM, Creech RH, Tormey DC, et al. [phone]). "Toxicity and response criteria of the [facility]". [place] Oncol. 5 (6): 649-55    LABORATORY DATA:  Lab Results  Component Value Date   WBC 4.5 12/22/2019   HGB 12.4 12/22/2019   HCT 37.6 12/22/2019   MCV 93.1 12/22/2019   PLT 298 12/22/2019   Lab Results  Component Value Date   NA 142 12/22/2019   K 3.7 12/22/2019   CL 110 12/22/2019   CO2 24 12/22/2019   Lab Results  Component Value Date   ALT 16 12/22/2019   AST 18 12/22/2019   ALKPHOS 100 12/22/2019   BILITOT 0.3 12/22/2019      RADIOGRAPHY: US BREAST LTD UNI LEFT INC AXILLA  Result Date: 12/08/2019 CLINICAL DATA:  Recall from screening mammography with tomosynthesis,  possible masses involving the LEFT breast. Family history of breast cancer in mother at age 23 and in a maternal aunt. EXAM: DIGITAL DIAGNOSTIC LEFT MAMMOGRAM WITH TOMO ULTRASOUND LEFT BREAST COMPARISON:  Previous exam(s). ACR Breast Density Category c: The breast tissue is heterogeneously dense, which may obscure small masses. FINDINGS: Tomosynthesis and synthesized spot-compression CC and MLO views of the areas of concern in the LEFT breast were obtained. The 3 masses questioned on screening mammography are confirmed and are as follows: INNER breast at MIDDLE depth: Mass with vague margins associated with possible architectural distortion measuring approximately 1.5 cm. UPPER INNER QUADRANT at MIDDLE depth: Partially obscured mass whose visible margins are circumscribed measuring approximately  0.4 cm without associated architectural distortion or suspicious calcifications. UPPER OUTER subareolar location: Partially obscured mass whose visible margins are circumscribed measuring approximately 0.8 cm without associated architectural distortion or suspicious calcifications. Targeted LEFT breast ultrasound is performed, showing a hypoechoic mass with vague and irregular margins and a hyperechoic halo at the 8:30 o'clock position approximately 5 cm from the nipple at MIDDLE depth. The hypoechoic mass measures approximately 0.8 x 0.9 x 1.5 cm. Including the hyperechoic halo, the entirety of the mass measures approximately 1.6 x 1.8 x 1.8 cm. The mass demonstrates posterior acoustic shadowing and demonstrates internal power Doppler flow. This correlates with the mammographic finding in the INNER breast. Benign focal duct ectasia is present at the 9 o'clock position approximately 1 cm from the nipple measuring 0.3 x 0.5 x 0.5 cm. A hypoechoic mass with mildly irregular margins is present at the 10 o'clock position approximately 3 cm from the nipple at MIDDLE depth measuring approximately 0.3 x 0.2 x 0.4 cm, demonstrating  no posterior characteristics and no internal power Doppler flow. This correlates with the mammographic finding in the [place]. A circumscribed oval parallel anechoic mass is present at the 1 o'clock position approximately 1 cm from the nipple at ANTERIOR depth measuring approximately 0.4 x 0.6 x 0.7 cm, demonstrating posterior acoustic enhancement and no internal power Doppler flow. This correlates with the mammographic finding in the OUTER subareolar location. Sonographic evaluation of the LEFT axilla demonstrates no pathologic lymphadenopathy. IMPRESSION: 1. Suspicious mass involving the LOWER INNER QUADRANT of the LEFT breast at the 8:30 o'clock position approximately 5 cm from the nipple. The hypoechoic mass measures approximately 1.5 cm maximally. Including the hyperechoic halo surrounding the mass, the maximum measurement is approximately 1.8 cm. 2. Indeterminate approximate 0.4 cm mass in the UPPER INNER QUADRANT of the LEFT breast at the 10 o'clock position approximately 3 cm from nipple. 3. No pathologic LEFT axillary lymphadenopathy. 4. Benign cyst in the UPPER OUTER subareolar LEFT breast. RECOMMENDATION: Ultrasound-guided core needle biopsy of the suspicious mass in the LOWER INNER QUADRANT of the LEFT breast and the indeterminate mass in the UPPER INNER QUADRANT of the LEFT breast The ultrasound core needle biopsy procedure was discussed with the patient and her questions were answered. She wishes to proceed and the biopsy has been scheduled at her convenience. I have discussed the findings and recommendations with the patient. BI-RADS CATEGORY  5: Highly suggestive of malignancy. Electronically Signed   By: Evangeline Dakin M.D.   On: 12/08/2019 15:39   MM DIAG BREAST TOMO UNI LEFT  Result Date: 12/08/2019 CLINICAL DATA:  Recall from screening mammography with tomosynthesis, possible masses involving the LEFT breast. Family history of breast cancer in mother at age 69 and in a maternal  aunt. EXAM: DIGITAL DIAGNOSTIC LEFT MAMMOGRAM WITH TOMO ULTRASOUND LEFT BREAST COMPARISON:  Previous exam(s). ACR Breast Density Category c: The breast tissue is heterogeneously dense, which may obscure small masses. FINDINGS: Tomosynthesis and synthesized spot-compression CC and MLO views of the areas of concern in the LEFT breast were obtained. The 3 masses questioned on screening mammography are confirmed and are as follows: INNER breast at MIDDLE depth: Mass with vague margins associated with possible architectural distortion measuring approximately 1.5 cm. UPPER INNER QUADRANT at MIDDLE depth: Partially obscured mass whose visible margins are circumscribed measuring approximately 0.4 cm without associated architectural distortion or suspicious calcifications. UPPER OUTER subareolar location: Partially obscured mass whose visible margins are circumscribed measuring approximately 0.8 cm without associated architectural  distortion or suspicious calcifications. Targeted LEFT breast ultrasound is performed, showing a hypoechoic mass with vague and irregular margins and a hyperechoic halo at the 8:30 o'clock position approximately 5 cm from the nipple at MIDDLE depth. The hypoechoic mass measures approximately 0.8 x 0.9 x 1.5 cm. Including the hyperechoic halo, the entirety of the mass measures approximately 1.6 x 1.8 x 1.8 cm. The mass demonstrates posterior acoustic shadowing and demonstrates internal power Doppler flow. This correlates with the mammographic finding in the INNER breast. Benign focal duct ectasia is present at the 9 o'clock position approximately 1 cm from the nipple measuring 0.3 x 0.5 x 0.5 cm. A hypoechoic mass with mildly irregular margins is present at the 10 o'clock position approximately 3 cm from the nipple at MIDDLE depth measuring approximately 0.3 x 0.2 x 0.4 cm, demonstrating no posterior characteristics and no internal power Doppler flow. This correlates with the mammographic finding in  the [place]. A circumscribed oval parallel anechoic mass is present at the 1 o'clock position approximately 1 cm from the nipple at ANTERIOR depth measuring approximately 0.4 x 0.6 x 0.7 cm, demonstrating posterior acoustic enhancement and no internal power Doppler flow. This correlates with the mammographic finding in the OUTER subareolar location. Sonographic evaluation of the LEFT axilla demonstrates no pathologic lymphadenopathy. IMPRESSION: 1. Suspicious mass involving the LOWER INNER QUADRANT of the LEFT breast at the 8:30 o'clock position approximately 5 cm from the nipple. The hypoechoic mass measures approximately 1.5 cm maximally. Including the hyperechoic halo surrounding the mass, the maximum measurement is approximately 1.8 cm. 2. Indeterminate approximate 0.4 cm mass in the UPPER INNER QUADRANT of the LEFT breast at the 10 o'clock position approximately 3 cm from nipple. 3. No pathologic LEFT axillary lymphadenopathy. 4. Benign cyst in the UPPER OUTER subareolar LEFT breast. RECOMMENDATION: Ultrasound-guided core needle biopsy of the suspicious mass in the LOWER INNER QUADRANT of the LEFT breast and the indeterminate mass in the UPPER INNER QUADRANT of the LEFT breast The ultrasound core needle biopsy procedure was discussed with the patient and her questions were answered. She wishes to proceed and the biopsy has been scheduled at her convenience. I have discussed the findings and recommendations with the patient. BI-RADS CATEGORY  5: Highly suggestive of malignancy. Electronically Signed   By: Evangeline Dakin M.D.   On: 12/08/2019 15:39   MM 3D SCREEN BREAST BILATERAL  Result Date: 12/01/2019 CLINICAL DATA:  Screening. EXAM: DIGITAL SCREENING BILATERAL MAMMOGRAM WITH TOMO AND CAD COMPARISON:  Previous exam(s). ACR Breast Density Category c: The breast tissue is heterogeneously dense, which may obscure small masses. FINDINGS: In the left breast, possible masses warrant further  evaluation. In the right breast, no findings suspicious for malignancy. Images were processed with CAD. IMPRESSION: Further evaluation is suggested for possible masses in the left breast. RECOMMENDATION: Diagnostic mammogram and possibly ultrasound of the left breast. (Code:[id number]-L-69M) The patient will be contacted regarding the findings, and additional imaging will be scheduled. BI-RADS CATEGORY  0: Incomplete. Need additional imaging evaluation and/or prior mammograms for comparison. Electronically Signed   By: Lovey Newcomer M.D.   On: 12/01/2019 14:41   MM CLIP PLACEMENT LEFT  Result Date: 12/14/2019 CLINICAL DATA:  54 year old female underwent biopsy of two left breast masses. EXAM: DIAGNOSTIC LEFT MAMMOGRAM POST ULTRASOUND BIOPSY COMPARISON:  Previous exam(s). FINDINGS: Mammographic images were obtained following ultrasound guided biopsy of a left breast mass at 8:30 o'clock. The ribbon biopsy marking clip is in expected position at the site  of biopsy. Mammographic images were obtained following ultrasound guided biopsy of the left breast mass at 10 o'clock. The coil biopsy marking clip is in expected position at the site of biopsy. The biopsied mass appears to be separate from the mass identified in the inner left breast on prior mammogram, which more likely corresponds to the benign duct ectasia identified at 9 o'clock sonographically. IMPRESSION: Appropriate positioning of the ribbon shaped biopsy marking clip at the site of biopsy in the left breast at 8:30 o'clock. Appropriate positioning of the coil shaped biopsy marking clip at the site of biopsy in the left breast at 10 o'clock. The biopsied mass appears to be different than the mass identified on mammogram in the upper inner quadrant, which in review likely corresponds to the benign duct ectasia present at 9 o'clock on ultrasound. Given the multiple findings in the left breast, MRI is recommended for further evaluation. Final Assessment: Post Procedure  Mammograms for Marker Placement Electronically Signed   By: Audie Pinto M.D.   On: 12/14/2019 09:17   [place] LT BREAST BX W LOC DEV 1ST LESION IMG BX SPEC US GUIDE  Addendum Date: 12/15/2019   ADDENDUM REPORT: 12/15/2019 13:28 ADDENDUM: Pathology revealed DUCTAL CARCINOMA IN SITU of the LEFT breast, 8:30 o'clock. 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 This was found to be concordant by Dr. Audie Pinto. 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 This was found to be concordant by Dr. Audie Pinto. Pathology results were discussed with the patient by telephone. The patient reported doing well after the biopsies with tenderness at the sites. Post biopsy instructions and care were reviewed and questions were answered. The patient was encouraged to call [facility] for any additional concerns. Recommend breast MRI in this patient for extent of disease in the left breast and full characterization of multiple other benign appearing masses. The patient was referred to [facility] on December 22, 2019. Pathology results reported by Stacie [place] RN on 12/15/2019. Electronically Signed   By: Audie Pinto M.D.   On: 12/15/2019 13:28   Result Date: 12/15/2019 CLINICAL DATA:  54 year old female presenting for biopsy of two left breast masses. EXAM: ULTRASOUND GUIDED LEFT BREAST CORE NEEDLE BIOPSY x 2 COMPARISON:  Previous exam(s). FINDINGS: I met with the patient and we discussed the procedure of ultrasound-guided biopsy, including benefits and alternatives. We discussed the high likelihood of a successful procedure. We discussed the risks of the procedure, including infection, bleeding, tissue injury, clip migration, and inadequate sampling. Informed written consent was given. The usual time-out protocol was performed immediately prior  to the procedure. 1.  Lesion quadrant: Lower inner quadrant Using sterile technique and 1% Lidocaine as local anesthetic, under direct ultrasound visualization, a 12 gauge spring-loaded device was used to perform biopsy of a left breast mass at 8:30 o'clock using a inferior approach. At the conclusion of the procedure ribbon tissue marker clip was deployed into the biopsy cavity. Follow up 2 view mammogram was performed and dictated separately. 2.  Lesion quadrant: Upper inner quadrant Using sterile technique and 1% Lidocaine as local anesthetic, under direct ultrasound visualization, a 14 gauge spring-loaded device was used to perform biopsy of a left breast mass at 10 o'clock using a inferior approach. At the conclusion of the procedure a coil tissue marker clip was deployed into the biopsy cavity. Follow up 2 view  mammogram was performed and dictated separately. IMPRESSION: Ultrasound guided biopsy of a left breast mass at 8:30 o'clock and a left breast mass at 10 o'clock. No apparent complications. Electronically Signed: By: Audie Pinto M.D. On: 12/14/2019 09:21   [place] LT BREAST BX W LOC DEV EA ADD LESION IMG BX SPEC US GUIDE  Addendum Date: 12/15/2019   ADDENDUM REPORT: 12/15/2019 13:28 ADDENDUM: Pathology revealed DUCTAL CARCINOMA IN SITU of the LEFT breast, 8:30 o'clock. 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 This was found to be concordant by Dr. Audie Pinto. 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 This was found to be concordant by Dr. Audie Pinto. Pathology results were discussed with the patient by telephone. The patient reported doing well after the biopsies with tenderness at the sites. Post biopsy instructions and care were reviewed and questions were answered. The patient was encouraged to call [facility] for any additional concerns. Recommend breast MRI in this patient for extent  of disease in the left breast and full characterization of multiple other benign appearing masses. The patient was referred to [facility] on December 22, 2019. Pathology results reported by Stacie [place] RN on 12/15/2019. Electronically Signed   By: Audie Pinto M.D.   On: 12/15/2019 13:28   Result Date: 12/15/2019 CLINICAL DATA:  54 year old female presenting for biopsy of two left breast masses. EXAM: ULTRASOUND GUIDED LEFT BREAST CORE NEEDLE BIOPSY x 2 COMPARISON:  Previous exam(s). FINDINGS: I met with the patient and we discussed the procedure of ultrasound-guided biopsy, including benefits and alternatives. We discussed the high likelihood of a successful procedure. We discussed the risks of the procedure, including infection, bleeding, tissue injury, clip migration, and inadequate sampling. Informed written consent was given. The usual time-out protocol was performed immediately prior to the procedure. 1.  Lesion quadrant: Lower inner quadrant Using sterile technique and 1% Lidocaine as local anesthetic, under direct ultrasound visualization, a 12 gauge spring-loaded device was used to perform biopsy of a left breast mass at 8:30 o'clock using a inferior approach. At the conclusion of the procedure ribbon tissue marker clip was deployed into the biopsy cavity. Follow up 2 view mammogram was performed and dictated separately. 2.  Lesion quadrant: Upper inner quadrant Using sterile technique and 1% Lidocaine as local anesthetic, under direct ultrasound visualization, a 14 gauge spring-loaded device was used to perform biopsy of a left breast mass at 10 o'clock using a inferior approach. At the conclusion of the procedure a coil tissue marker clip was deployed into the biopsy cavity. Follow up 2 view mammogram was performed and dictated separately. IMPRESSION: Ultrasound guided biopsy of a left breast mass at 8:30 o'clock and a left  breast mass at 10 o'clock. No apparent complications. Electronically Signed: By: Audie Pinto M.D. On: 12/14/2019 09:21       IMPRESSION/PLAN: 1. High Grade ER/PR negative DCIS of the left breast. Dr. Lisbeth Renshaw discusses the pathology findings and reviews the nature of noninvasive left breast disease. The consensus from the breast conference includes an MRI of the breast to  further evaluate extent of disease breast. She is a candidate for breast conservation with lumpectomy. Dr. Lisbeth Renshaw discusses that she would benefit from external radiotherapy to the breast to reduce the risks of local recurrence. We discussed the risks, benefits, short, and long term effects of radiotherapy, and the patient is interested in  proceeding. Dr. Lisbeth Renshaw discusses the delivery and logistics of radiotherapy and anticipates a course of 6 1/2 weeks of radiotherapy.  2. Possible genetic predisposition to malignancy. The patient is a candidate for genetic testing given her personal and family history. She was offered referral and accepts this visit which will be coordinated following our visit.   In a visit lasting 45 minutes, greater than 50% of the time was spent face to face discussing her case, and in preparation to the visit in coordinating the patient's care.  The above documentation reflects my direct findings during this shared patient visit. Please see the separate note by Dr. Lisbeth Renshaw on this date for the remainder of the patient's plan of care.    Carola Rhine, PAC

## 2019-12-22 NOTE — Progress Notes (Signed)
REFERRING PROVIDER: Chauncey Cruel, MD [address] [place],  [place] [address]  PRIMARY PROVIDER:  Vicenta Aly, FNP  PRIMARY REASON FOR VISIT:  1. Ductal carcinoma in situ (DCIS) of left breast   2. Family history of breast cancer   3. Family history of melanoma   4. Family history of colon cancer   5. Family history of brain cancer   6. Malignant melanoma of right lower leg ([place])      I connected with Sherri Schultz on 12/22/2019 at 11:00 am EDT by Webex video conference and verified that I am speaking with the correct person using two identifiers.   Patient location: [facility] Provider location: [facility] office  HISTORY OF PRESENT ILLNESS:   Sherri Schultz, a 54 y.o. female, was seen for a [place] cancer genetics consultation at the request of Dr. Jana Hakim due to a personal and family history of breast cancer.  Sherri Schultz presents to clinic today to discuss the possibility of a hereditary predisposition to cancer, genetic testing, and to further clarify her future cancer risks, as well as potential cancer risks for family members.   In 2021, at the age of 39, Sherri Schultz was diagnosed with DCIS, ER-/PR-, of the left breast. The treatment plan includes genetic testing, surgery, adjuvant radiation, and consideration of prophylactic antiestrogens.   Sherri Schultz also has a history of melanoma removed from her right knee in 2012, at the age of 13.  CANCER HISTORY:  Oncology History   No history exists.     RISK FACTORS:  Menarche was at age 7.  No live births.  OCP use for approximately 0 years.  Ovaries intact: yes.  Hysterectomy: no.  Menopausal status: perimenopausal.  HRT use: 0 years. Colonoscopy: no. Mammogram within the last year: yes. Number of breast biopsies: 3. Any excessive radiation exposure in the past: no  Past Medical History:  Diagnosis Date  . Anxiety   . Cancer [facility]) 2013   right knee  . Family history of brain cancer   .  Family history of breast cancer   . Family history of colon cancer   . Family history of melanoma   . Hypertension   . Trimalleolar fracture of ankle, closed, left, initial encounter     Past Surgical History:  Procedure Laterality Date  . BREAST BIOPSY Right 10+ yrs ago   benign  . MELANOMA EXCISION Right 2013   knee  . ORIF ANKLE FRACTURE Left 05/06/2018   Procedure: OPEN REDUCTION INTERNAL FIXATION (ORIF) LEFT TRIMALLEOLAR ANKLE FRACTURE;  Surgeon: Leandrew Koyanagi, MD;  Location: [facility];  Service: Orthopedics;  Laterality: Left;    Social History   Socioeconomic History  . Marital status: Single    Spouse name: Not on file  . Number of children: Not on file  . Years of education: Not on file  . Highest education level: Not on file  Occupational History  . Not on file  Tobacco Use  . Smoking status: Never Smoker  . Smokeless tobacco: Never Used  Substance and Sexual Activity  . Alcohol use: Never  . Drug use: Never  . Sexual activity: Not on file  Other Topics Concern  . Not on file  Social History Narrative  . Not on file   Social Determinants of Health   Financial Resource Strain:   . Difficulty of Paying Living Expenses: Not on file  Food Insecurity:   . Worried About Charity fundraiser in the  Last Year: Not on file  . Ran Out of Food in the Last Year: Not on file  Transportation Needs:   . Lack of Transportation (Medical): Not on file  . Lack of Transportation (Non-Medical): Not on file  Physical Activity:   . Days of Exercise per Week: Not on file  . Minutes of Exercise per Session: Not on file  Stress:   . Feeling of Stress : Not on file  Social Connections:   . Frequency of Communication with Friends and Family: Not on file  . Frequency of Social Gatherings with Friends and Family: Not on file  . Attends Religious Services: Not on file  . Active Member of Clubs or Organizations: Not on file  . Attends Archivist Meetings:  Not on file  . Marital Status: Not on file     FAMILY HISTORY:  We obtained a detailed, 4-generation family history.  Significant diagnoses are listed below: Family History  Problem Relation Age of Onset  . Breast cancer Mother 27  . Diabetes Mother   . Colon cancer Mother 15  . Breast cancer Maternal Aunt        dx. >50  . Diabetes Father   . Hypertension Father   . Heart attack Father   . Arthritis Father   . Diabetes Brother   . Melanoma Brother 62  . Diabetes Brother   . Breast cancer Cousin        dx. 40s/50s  . Brain cancer Maternal Uncle        dx. 71s  . Breast cancer Maternal Aunt        dx. >50  . Breast cancer Maternal Aunt        dx. >50  . Cancer Maternal Uncle        unknown type, dx. >50  . Breast cancer Cousin        dx. 40s/50s   Sherri Schultz does not have children. She has two brothers, ages 39 and 7. One of her brothers had melanoma diagnosed when he was 4.   Sherri Schultz's mother died at age 94 from metastatic breast cancer that was initially diagnosed at age 48. Her mother also had a separate colon cancer primary diagnosed at age 29. Sherri Schultz had four maternal aunts and three maternal uncles. Three of her maternal aunts had breast cancer, all diagnosed older than 66. One of her maternal uncles had cancer diagnosed in his 24s which Sherri Schultz believes was brain cancer. Another maternal uncle had a cancer diagnosed when he was older than 23, although she is unsure what type of cancer it was. She also has two maternal cousins who were diagnosed with breast cancer in their 60s or 51s. Her maternal grandparents died in their 56s and 71s and did not have cancer.   Sherri Schultz's father died at age 46 and did not have cancer. She has two paternal aunts and two paternal uncles, all of whom are half-siblings to her father. Her paternal grandmother died around age 62, and she is not sure how old her paternal grandfather was when he died. There are no known  diagnoses of cancer on the paternal side of the family.  Sherri Schultz is unaware of previous family history of genetic testing for hereditary cancer risks. She does not know her ancestry. There is no reported Ashkenazi Jewish ancestry. There is no known consanguinity.  GENETIC COUNSELING ASSESSMENT: Sherri Schultz is a 54 y.o. female with a personal and family history  of breast cancer which is somewhat suggestive of a hereditary cancer syndrome and predisposition to cancer. We, therefore, discussed and recommended the following at today's visit.   DISCUSSION: We discussed that 5 - 10% of breast cancer is hereditary, with most cases associated with the BRCA genes.  There are other genes that can be associated with hereditary breast cancer syndromes.  These include ATM, CHEK2, PALB2, etc.  We discussed that testing is beneficial for several reasons including knowing about other cancer risks, identifying potential screening and risk-reduction options that may be appropriate, and to understand if other family members could be at risk for cancer and allow them to undergo genetic testing.   We reviewed the characteristics, features and inheritance patterns of hereditary cancer syndromes. We also discussed genetic testing, including the appropriate family members to test, the process of testing, insurance coverage and turn-around-time for results. We discussed the implications of a negative, positive and/or variant of uncertain significant result. In order to get genetic test results in a timely manner so that Sherri Schultz can use these genetic test results for surgical decisions, we recommended Sherri Schultz pursue genetic testing for the [facility] Breast Cancer STAT panel. Once complete, we recommend Sherri Schultz pursue reflex genetic testing to the Multi-Cancer panel.   The STAT Breast cancer panel offered by [facility] includes sequencing and rearrangement analysis for the following 9 genes:  ATM, BRCA1, BRCA2, CDH1,  CHEK2, PALB2, PTEN, STK11 and TP53.  The Multi-Cancer Panel offered by [facility] includes sequencing and/or deletion duplication testing of the following 85 genes: AIP, ALK, APC, ATM, AXIN2,BAP1,  BARD1, BLM, BMPR1A, BRCA1, BRCA2, BRIP1, CASR, CDC73, CDH1, CDK4, CDKN1B, CDKN1C, CDKN2A (p14ARF), CDKN2A (p16INK4a), CEBPA, CHEK2, CTNNA1, DICER1, DIS3L2, EGFR (c.2369C>T, p.Thr790Met variant only), EPCAM (Deletion/duplication testing only), FH, FLCN, GATA2, GPC3, GREM1 (Promoter region deletion/duplication testing only), HOXB13 (c.251G>A, p.Gly84Glu), HRAS, KIT, MAX, MEN1, MET, MITF (c.952G>A, p.Glu318Lys variant only), MLH1, MSH2, MSH3, MSH6, MUTYH, NBN, NF1, NF2, NTHL1, PALB2, PDGFRA, PHOX2B, PMS2, POLD1, POLE, POT1, PRKAR1A, PTCH1, PTEN, RAD50, RAD51C, RAD51D, RB1, RECQL4, RET, RNF43, RUNX1, SDHAF2, SDHA (sequence changes only), SDHB, SDHC, SDHD, SMAD4, SMARCA4, SMARCB1, SMARCE1, STK11, SUFU, TERC, TERT, TMEM127, TP53, TSC1, TSC2, VHL, WRN and WT1.    Based on Sherri Schultz's personal and family history of cancer, she meets medical criteria for genetic testing. Despite that she meets criteria, she may still have an out of pocket cost.   PLAN: After considering the risks, benefits, and limitations, Sherri Schultz provided informed consent to pursue genetic testing and the blood sample was sent to [facility] for analysis of the Breast Cancer STAT panel + Multi-Cancer panel. Results should be available within approximately one-two weeks' time, at which point they will be disclosed by telephone to Sherri Schultz, as will any additional recommendations warranted by these results. Sherri Schultz will receive a summary of her genetic counseling visit and a copy of her results once available. This information will also be available in Epic.   Sherri Schultz questions were answered to her satisfaction today. Our contact information was provided should additional questions or concerns arise. Thank you for the referral  and allowing [place] to share in the care of your patient.   Clint Guy, MS, [facility] Licensed Certified Genetic Counselor [email or web address] Phone: [phone]  The patient was seen for a total of 20 minutes in face-to-face genetic counseling.  This patient was discussed with Drs. Magrinat, Lindi Adie and/or Burr Medico who agrees with the above.    _______________________________________________________________________ For Office Staff:  Number of  people involved in session: 1 Was an Intern/ student involved with case: no

## 2019-12-22 NOTE — Progress Notes (Signed)
   [facility] Psychosocial Distress Screening Counseling Intern  Counseling Intern was referred by distress screening protocol.  The patient scored a 1 on the Psychosocial Distress Thermometer which indicates mild distress. Counseling intern met with patient in exam room to assess for distress and other psychosocial needs.  ON[facility]N DISTRESS SCREENING 12/22/2019  Screening Type Initial Screening  Distress experienced in past week (1-10) 1  Emotional problem type Adjusting to illness  Referral to support programs Yes   Counseling intern assessed patient for distress and informed pt of the support services available to her during and after her cancer tx. Pt reported that her mother died from cancer and that has brought [place] some anxiety. But pt stated that she learned today that her prognosis is good and she no longer feels worried that she will have the same fate as her mother. Pt reported only mild distress and stated she has her extended family to thank for her positive mental state. Pt recognized that although many of her extended family members have had to face cancer tx, they are now able to use their past experience to help the pt through her own tx.Counseling intern provided empathy and compassion and normalized the pt's emotional response to her cancer dx. Pt declined the need for support services at this time, but said that she will reach out if she feels the need for support in any way.    Clinical Social Worker follow up needed: No.  Art Buff Dufur Counseling Intern Voicemail:  [phone]

## 2019-12-23 ENCOUNTER — Telehealth: Payer: Self-pay | Admitting: Oncology

## 2019-12-23 NOTE — Telephone Encounter (Signed)
I talk with patient regarding schedule  

## 2019-12-28 ENCOUNTER — Telehealth: Payer: Self-pay

## 2019-12-28 NOTE — Telephone Encounter (Signed)
Nutrition Assessment  Reason for Assessment:  Pt attended Breast Clinic on 12/22/2019 and was given nutrition packet by nurse navigator  ASSESSMENT:  54 year old female with new diagnosis of DCIS.  Planning lumpectomy, radiation.  Past medical history reviewed.   Spoke with patient via phone to introduce self and service at [facility].  Patient reports that she has a normal appetite.   Medications:  reviewed  Labs: glucose 101  Anthropometrics:   Height: 64 inches Weight: 159 lb BMI: 27   NUTRITION DIAGNOSIS: Food and nutrition related knowledge deficit related to new diagnosis of breast cancer as evidenced by no prior need for nutrition related information.  INTERVENTION:   Discussed briefly packet of information regarding nutritional tips for breast cancer patients.  No questions at this time. Contact information provided and patient knows to contact me with questions/concerns.    MONITORING, EVALUATION, and GOAL: Pt will consume a healthy plant based diet to maintain lean body mass throughout treatment.   Sherri Schultz B. Zenia Resides, [place], [place] Registered Dietitian [phone] (pager)

## 2019-12-29 ENCOUNTER — Ambulatory Visit (HOSPITAL_COMMUNITY)
Admission: RE | Admit: 2019-12-29 | Discharge: 2019-12-29 | Disposition: A | Discharge status: Home / Self Care | Payer: BC Managed Care – PPO | Source: Ambulatory Visit | Attending: General Surgery | Admitting: General Surgery

## 2019-12-29 ENCOUNTER — Other Ambulatory Visit: Payer: Self-pay

## 2019-12-29 ENCOUNTER — Telehealth: Payer: Self-pay | Admitting: *Deleted

## 2019-12-29 DIAGNOSIS — D0512 Intraductal carcinoma in situ of left breast: Secondary | ICD-10-CM | POA: Diagnosis not present

## 2019-12-29 IMAGING — MR MR BREAST BILAT WO/W CM
5 of 13 series · 18 of 48 positions shown · IV contrast (Yes)
Comparison: Prior mammograms and ultrasounds

CLINICAL DATA: 53-year-old female with newly diagnosed LEFT breast
DCIS.

LABS:  None performed today
EXAM:
BILATERAL BREAST MRI WITH AND WITHOUT CONTRAST
TECHNIQUE: Multiplanar, multisequence MR images of both breasts were obtained
prior to and following the intravenous administration of 7.5 ml of
Gadavist

[Series 3: T1 · axial · non-contrast · 1.8mm · 0.70mm/px · z∈[-137,+85]mm · 5 of 248 slices shown]
[im 1/248]
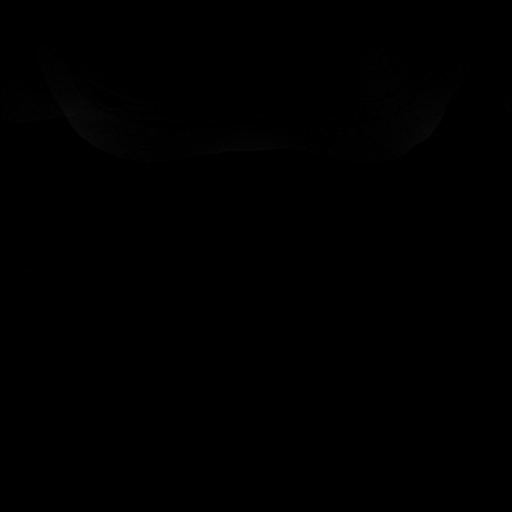
[im 62/248]
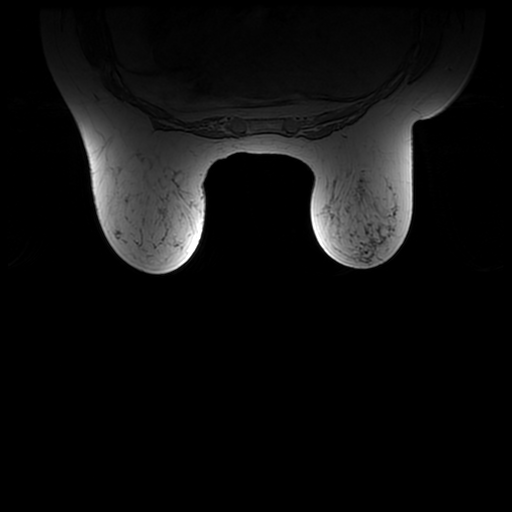
[im 124/248]
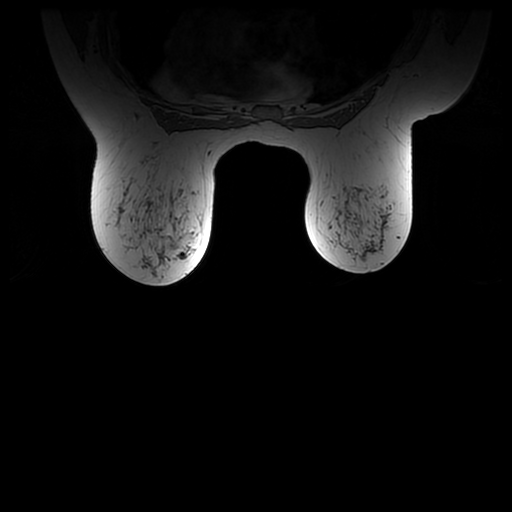
[im 186/248]
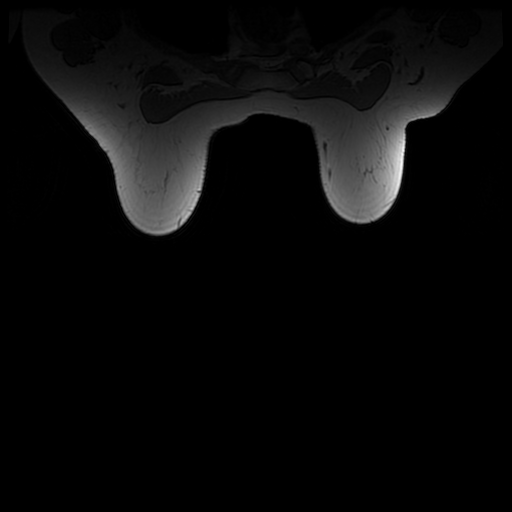
[im 248/248]
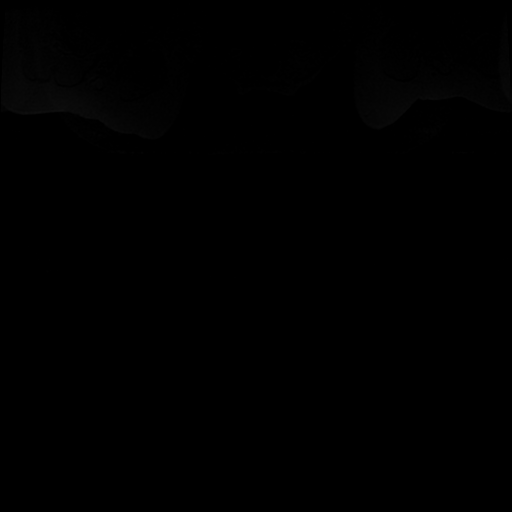

[Series 4: T2 · axial · 3.0mm · 0.70mm/px · 1 of 75 slices shown]
[im 1/75]
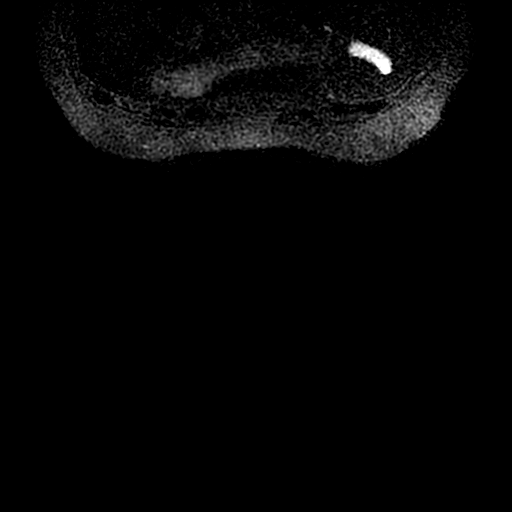

[Series 500: T1 fat-sat · axial · 1.8mm · 0.70mm/px · z∈[-137,+85]mm · 5 of 248 slices shown (1 of 3)]
[im 1/248]
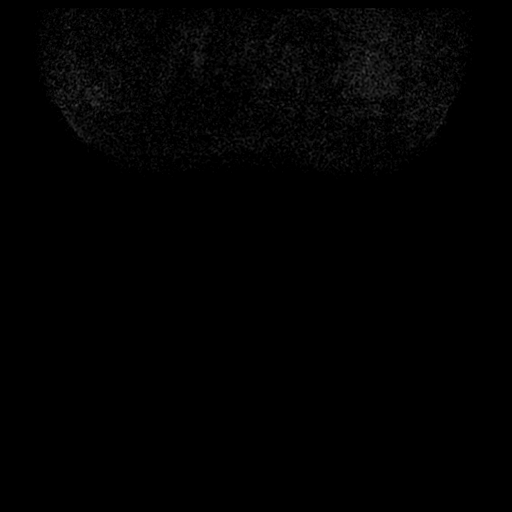
[im 62/248]
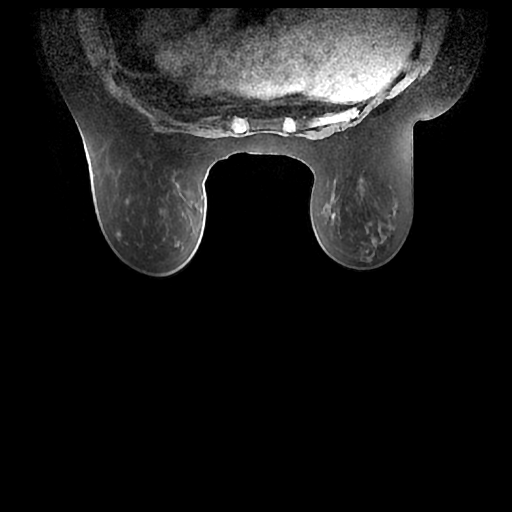
[im 124/248]
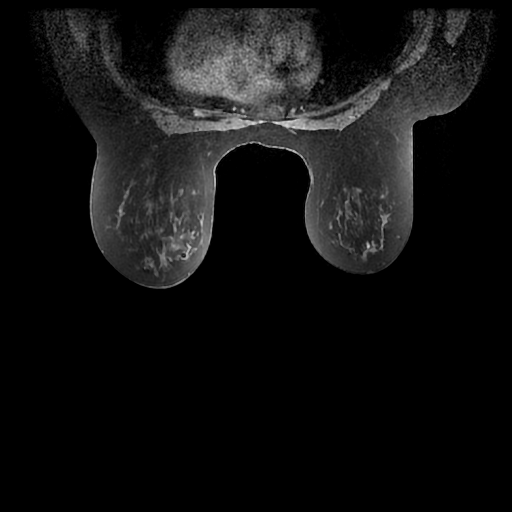
[im 186/248]
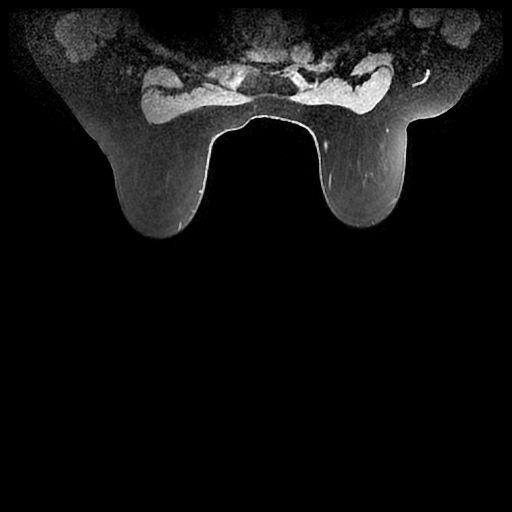
[im 248/248]
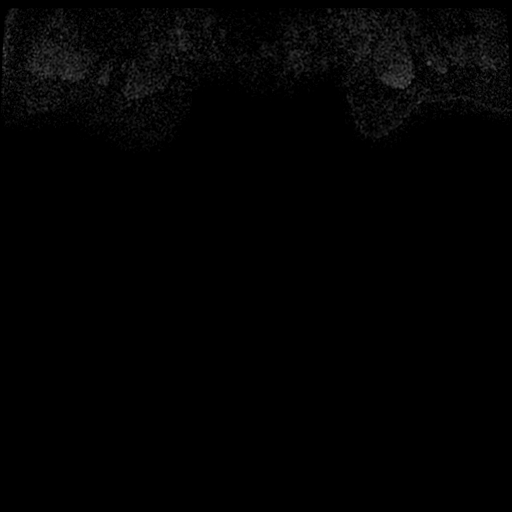

[Series 501: T1 fat-sat · axial · 1.8mm · 0.70mm/px · z∈[-137,+85]mm · 5 of 248 slices shown (2 of 3)]
[im 1/248]
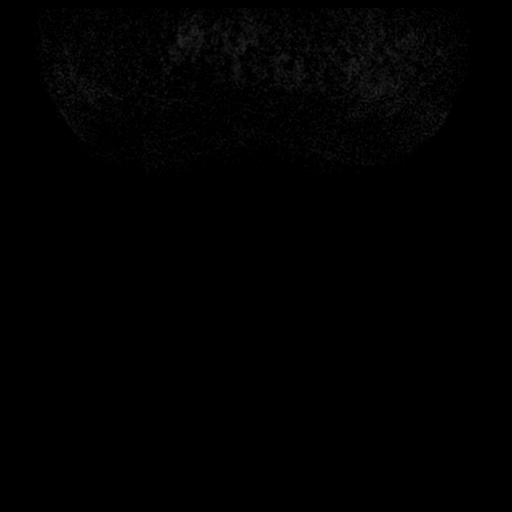
[im 62/248]
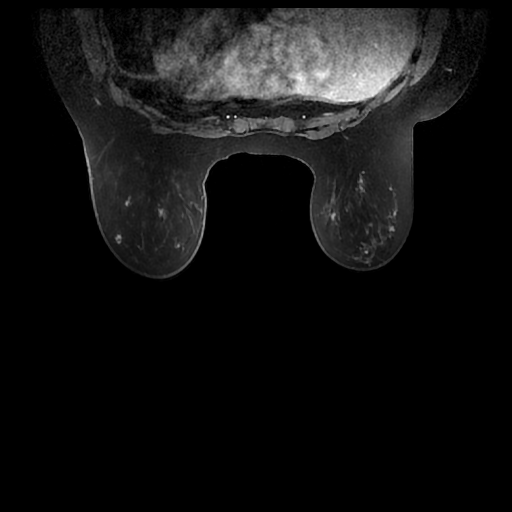
[im 124/248]
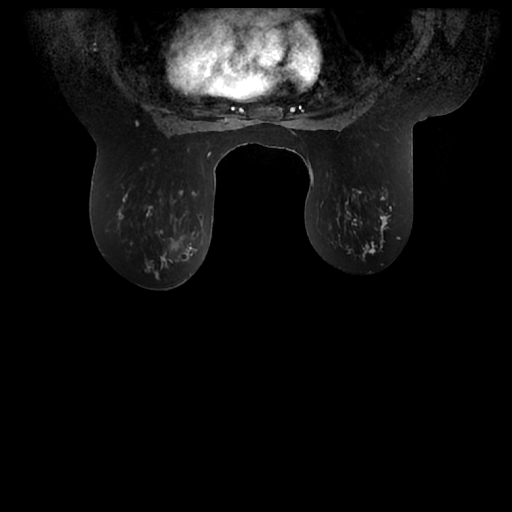
[im 186/248]
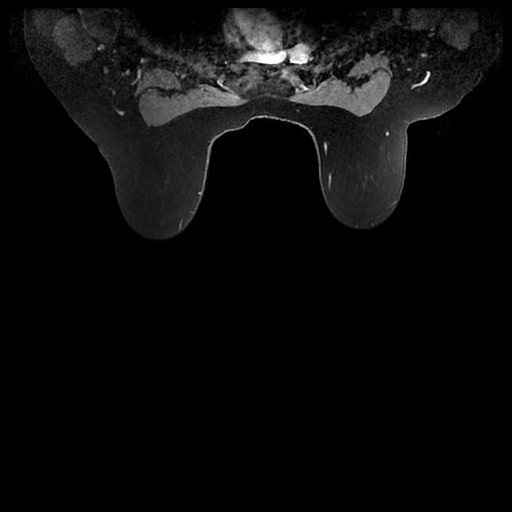
[im 248/248]
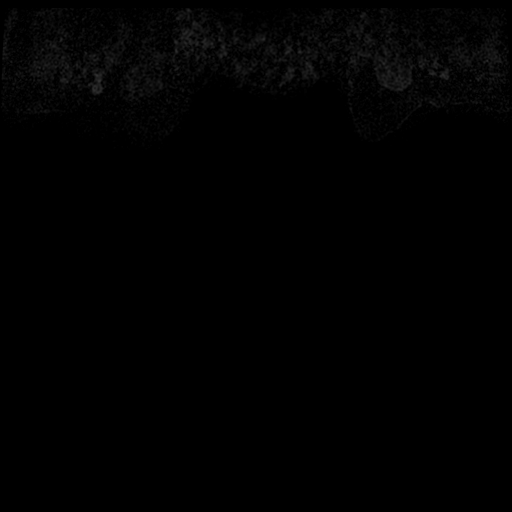

[Series 502: T1 fat-sat · axial · 1.8mm · 0.70mm/px · z∈[-137,-82]mm · 2 of 248 slices shown (3 of 3)]
[im 1/248]
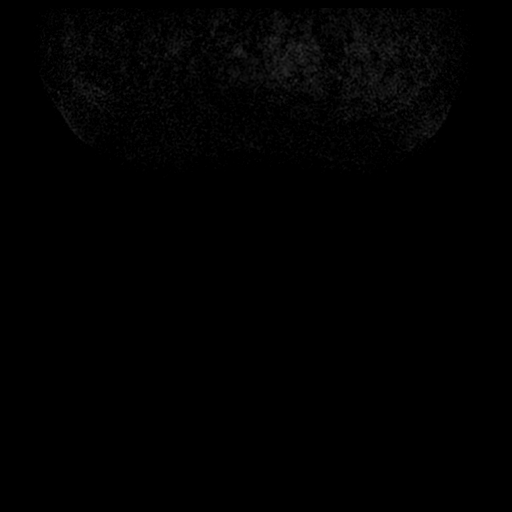
[im 62/248]
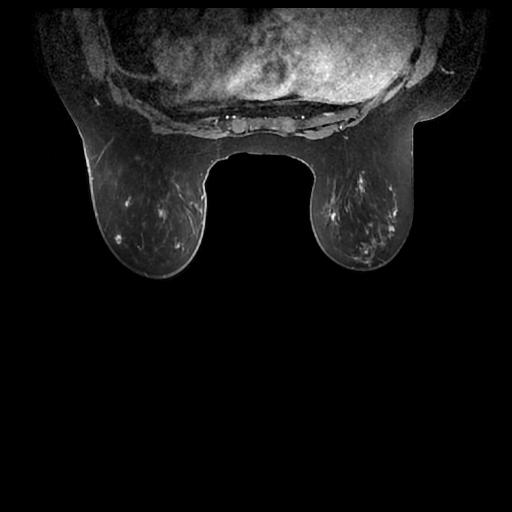

[18 of 48 positions shown; findings below may reference images not displayed]

Three-dimensional MR images were rendered by post-processing of the
original MR data on an independent workstation. The
three-dimensional MR images were interpreted, and findings are
reported in the following complete MRI report for this study. Three
dimensional images were evaluated at the independent [employer]
workstation
FINDINGS: Breast composition: b. Scattered fibroglandular tissue.

Background parenchymal enhancement: Mild

Right breast: Multiple patchy areas of non masslike enhancement are
noted, primarily within the anterior and central RIGHT breast. No
suspicious focal mass or distortion identified.

Left breast: A 0.8 x 2.5 x 1.2 cm area (transverse x AP x CC) of non
masslike enhancement within the LOWER INNER LEFT breast,
middle-posterior depth, contains biopsy clip artifact and compatible
with biopsy-proven DCIS (series [K1]: Images [id number]).

Biopsy clip artifact within the anterior UPPER-OUTER LEFT breast
identified compatible with recent benign biopsy.

No other suspicious areas of enhancement within the LEFT breast are
identified.

Lymph nodes: No abnormal appearing lymph nodes.

Ancillary findings:  None.
IMPRESSION: 1. 0.8 x 2.5 x 1.2 cm area of biopsy-proven DCIS within the LOWER
INNER LEFT breast.
2. Multiple patchy areas of non masslike enhancement throughout the
RIGHT breast, primarily anterior and central. Although these areas
of non masslike enhancement most likely represent normal
fibroglandular tissue or are benign, sampling of 1 of the more
prominent areas is recommended as these areas have a similar
appearance (but less suspicious enhancement) to the area of
biopsy-proven LEFT breast DCIS.
3. No abnormal lymph nodes.

RECOMMENDATION:
MR guided RIGHT breast biopsy of 1 of the areas of patchy non
masslike enhancement within the RIGHT breast, at the performing
radiologist discretion.

BI-RADS CATEGORY  4: Suspicious.

## 2019-12-29 MED ORDER — GADOBUTROL 1 MMOL/ML IV SOLN
7.5000 mL | Freq: Once | INTRAVENOUS | Status: AC | PRN
  Administered 2019-12-29: 7.5 mL via INTRAVENOUS

## 2019-12-29 NOTE — Telephone Encounter (Signed)
Left vm regarding [facility] from 1.27.21. Contact information provided for questions or needs.

## 2019-12-30 ENCOUNTER — Encounter: Payer: Self-pay | Admitting: *Deleted

## 2019-12-31 ENCOUNTER — Encounter: Payer: Self-pay | Admitting: *Deleted

## 2020-01-01 DIAGNOSIS — Z1379 Encounter for other screening for genetic and chromosomal anomalies: Secondary | ICD-10-CM | POA: Insufficient documentation

## 2020-01-04 ENCOUNTER — Encounter: Payer: Self-pay | Admitting: Genetic Counselor

## 2020-01-04 ENCOUNTER — Other Ambulatory Visit: Payer: Self-pay | Admitting: General Surgery

## 2020-01-04 ENCOUNTER — Encounter: Payer: Self-pay | Admitting: *Deleted

## 2020-01-04 ENCOUNTER — Telehealth: Payer: Self-pay | Admitting: Genetic Counselor

## 2020-01-04 ENCOUNTER — Ambulatory Visit: Payer: Self-pay | Admitting: Genetic Counselor

## 2020-01-04 DIAGNOSIS — R9389 Abnormal findings on diagnostic imaging of other specified body structures: Secondary | ICD-10-CM

## 2020-01-04 DIAGNOSIS — Z1379 Encounter for other screening for genetic and chromosomal anomalies: Secondary | ICD-10-CM

## 2020-01-04 NOTE — Telephone Encounter (Signed)
LVM that her genetic test results are available and requested that she call back to discuss them.  

## 2020-01-04 NOTE — Telephone Encounter (Signed)
Revealed negative genetic testing.  Discussed that we do not know why she has breast cancer or why there is cancer in the family.  It could be due to a different gene that we are not testing, or our current technology may not be able detect something.  It is also possible that there could be a hereditary cause for the cancer in the family that Sherri Schultz did not inherit.  It will be important for her to keep in contact with genetics to keep up with whether additional testing may be appropriate in the future.

## 2020-01-04 NOTE — Progress Notes (Signed)
HPI:  [name] was previously seen in the [place] clinic due to a personal and family history of breast cancer and concerns regarding a hereditary predisposition to cancer. Please refer to our prior cancer genetics clinic note for more information regarding our discussion, assessment and recommendations, at the time. [name] recent genetic test results were disclosed to her, as were recommendations warranted by these results. These results and recommendations are discussed in more detail below.  CANCER HISTORY:  Oncology History  Ductal carcinoma in situ (DCIS) of left breast  12/15/2019 Initial Diagnosis   Ductal carcinoma in situ (DCIS) of left breast   01/01/2020 Genetic Testing   Negative genetic testing:  No pathogenic variants detected on the [facility] Breast Cancer STAT panel or Multi-Cancer panel. A variant of uncertain significance was detected in the MSH3 gene called c.230_232dup (p.Pro77dup). The report date is 01/01/2020.  The STAT Breast cancer panel offered by [facility] includes sequencing and rearrangement analysis for the following 9 genes:  ATM, BRCA1, BRCA2, CDH1, CHEK2, PALB2, PTEN, STK11 and TP53.  The Multi-Cancer Panel offered by [facility] includes sequencing and/or deletion duplication testing of the following 85 genes: AIP, ALK, APC, ATM, AXIN2,BAP1,  BARD1, BLM, BMPR1A, BRCA1, BRCA2, BRIP1, CASR, CDC73, CDH1, CDK4, CDKN1B, CDKN1C, CDKN2A (p14ARF), CDKN2A (p16INK4a), CEBPA, CHEK2, CTNNA1, DICER1, DIS3L2, EGFR (c.2369C>T, p.Thr790Met variant only), EPCAM (Deletion/duplication testing only), FH, FLCN, GATA2, GPC3, GREM1 (Promoter region deletion/duplication testing only), HOXB13 (c.251G>A, p.Gly84Glu), HRAS, KIT, MAX, MEN1, MET, MITF (c.952G>A, p.Glu318Lys variant only), MLH1, MSH2, MSH3, MSH6, MUTYH, NBN, NF1, NF2, NTHL1, PALB2, PDGFRA, PHOX2B, PMS2, POLD1, POLE, POT1, PRKAR1A, PTCH1, PTEN, RAD50, RAD51C, RAD51D, RB1, RECQL4, RET, RNF43, RUNX1, SDHAF2, SDHA  (sequence changes only), SDHB, SDHC, SDHD, SMAD4, SMARCA4, SMARCB1, SMARCE1, STK11, SUFU, TERC, TERT, TMEM127, TP53, TSC1, TSC2, VHL, WRN and WT1.        FAMILY HISTORY:  We obtained a detailed, 4-generation family history.  Significant diagnoses are listed below: Family History  Problem Relation Age of Onset  . Breast cancer Mother 67  . Diabetes Mother   . Colon cancer Mother 73  . Breast cancer Maternal Aunt        dx. >50  . Diabetes Father   . Hypertension Father   . Heart attack Father   . Arthritis Father   . Diabetes Brother   . Melanoma Brother 48  . Diabetes Brother   . Breast cancer Cousin        dx. 40s/50s  . Brain cancer Maternal Uncle        dx. 30s  . Breast cancer Maternal Aunt        dx. >50  . Breast cancer Maternal Aunt        dx. >50  . Cancer Maternal Uncle        unknown type, dx. >50  . Breast cancer Cousin        dx. 40s/50s   [name] does not have children. She has two brothers, ages 40 and 51. One of her brothers had melanoma diagnosed when he was 79.   [name]'s mother died at age 46 from metastatic breast cancer that was initially diagnosed at age 35. Her mother also had a separate colon cancer primary diagnosed at age 15. [name] had four maternal aunts and three maternal uncles. Three of her maternal aunts had breast cancer, all diagnosed older than 45. One of her maternal uncles had cancer diagnosed in his 2s which [name] believes was brain  cancer. Another maternal uncle had a cancer diagnosed when he was older than 33, although she is unsure what type of cancer it was. She also has two maternal cousins who were diagnosed with breast cancer in their 77s or 46s. Her maternal grandparents died in their 68s and 41s and did not have cancer.   [name]'s father died at age 37 and did not have cancer. She has two paternal aunts and two paternal uncles, all of whom are half-siblings to her father. Her paternal grandmother died  around age 54, and she is not sure how old her paternal grandfather was when he died. There are no known diagnoses of cancer on the paternal side of the family.  [name] is unaware of previous family history of genetic testing for hereditary cancer risks. She does not know her ancestry. There is no reported Ashkenazi Jewish ancestry. There is no known consanguinity.  GENETIC TEST RESULTS: Genetic testing reported out on 01/01/20 through the [facility] Breast Cancer STAT panel and Multi-Cancer panel. No pathogenic variants were detected.   The STAT Breast cancer panel offered by [facility] includes sequencing and rearrangement analysis for the following 9 genes:  ATM, BRCA1, BRCA2, CDH1, CHEK2, PALB2, PTEN, STK11 and TP53. The Multi-Cancer Panel offered by [facility] includes sequencing and/or deletion duplication testing of the following 85 genes: AIP, ALK, APC, ATM, AXIN2,BAP1,  BARD1, BLM, BMPR1A, BRCA1, BRCA2, BRIP1, CASR, CDC73, CDH1, CDK4, CDKN1B, CDKN1C, CDKN2A (p14ARF), CDKN2A (p16INK4a), CEBPA, CHEK2, CTNNA1, DICER1, DIS3L2, EGFR (c.2369C>T, p.Thr790Met variant only), EPCAM (Deletion/duplication testing only), FH, FLCN, GATA2, GPC3, GREM1 (Promoter region deletion/duplication testing only), HOXB13 (c.251G>A, p.Gly84Glu), HRAS, KIT, MAX, MEN1, MET, MITF (c.952G>A, p.Glu318Lys variant only), MLH1, MSH2, MSH3, MSH6, MUTYH, NBN, NF1, NF2, NTHL1, PALB2, PDGFRA, PHOX2B, PMS2, POLD1, POLE, POT1, PRKAR1A, PTCH1, PTEN, RAD50, RAD51C, RAD51D, RB1, RECQL4, RET, RNF43, RUNX1, SDHAF2, SDHA (sequence changes only), SDHB, SDHC, SDHD, SMAD4, SMARCA4, SMARCB1, SMARCE1, STK11, SUFU, TERC, TERT, TMEM127, TP53, TSC1, TSC2, VHL, WRN and WT1. The test report will be scanned into EPIC and located under the Molecular Pathology section of the Results Review tab.  A portion of the result report is included below for reference.     We discussed with [name] that because current genetic testing is not perfect, it is possible  there may be a gene mutation in one of these genes that current testing cannot detect, but that chance is small.  We also discussed, that there could be another gene that has not yet been discovered, or that we have not yet tested, that is responsible for the cancer diagnoses in the family. It is also possible there is a hereditary cause for the cancer in the family that [name] did not inherit and therefore was not identified in her testing.  Therefore, it is important to remain in touch with cancer genetics in the future so that we can continue to offer [name] the most up to date genetic testing.   Genetic testing did identify a variant of uncertain significance (VUS) in the MSH3 gene called c.230_232dup (p.Pro77dup).  At this time, it is unknown if this variant is associated with increased cancer risk or if this is a normal finding, but most variants such as this get reclassified to being inconsequential. It should not be used to make medical management decisions. With time, we suspect the lab will determine the significance of this variant, if any. If we do learn more about it, we will try to contact [name] to discuss  it further. However, it is important to stay in touch with [place] periodically and keep the address and phone number up to date.  CANCER SCREENING RECOMMENDATIONS: [name] test result is considered negative (normal).  This means that we have not identified a hereditary cause for her personal and family history of cancer at this time.   Given [name]'s personal and family histories, we must interpret these negative results with some caution.  Families with features suggestive of hereditary risk for cancer tend to have multiple family members with cancer, diagnoses in multiple generations and diagnoses before the age of 25. [name]'s family exhibits some of these features. Thus, this result may simply reflect our current inability to detect all mutations within  these genes or there may be a different gene that has not yet been discovered or tested.   An individual's cancer risk and medical management are not determined by genetic test results alone. Overall cancer risk assessment incorporates additional factors, including personal medical history, family history, and any available genetic information that may result in a personalized plan for cancer prevention and surveillance.  RECOMMENDATIONS FOR FAMILY MEMBERS:  Individuals in this family might be at some increased risk of developing cancer, over the general population risk, simply due to the family history of cancer.  We recommended women in this family have a yearly mammogram beginning at age 38, or 67 years younger than the earliest onset of cancer, an annual clinical breast exam, and perform monthly breast self-exams. Women in this family should also have a gynecological exam as recommended by their primary provider. All family members should have a colonoscopy by age 47.  It is also possible there is a hereditary cause for the cancer in [name]'s family that she did not inherit and therefore was not identified in her.  Based on [name]'s family history, we recommended her living maternal aunt and maternal cousins who were diagnosed with breast cancer have genetic counseling and testing. [name] will let us know if we can be of any assistance in coordinating genetic counseling and/or testing for this family member.   FOLLOW-UP: Lastly, we discussed with [name] that cancer genetics is a rapidly advancing field and it is possible that new genetic tests will be appropriate for her and/or her family members in the future. We encouraged her to remain in contact with cancer genetics on an annual basis so we can update her personal and family histories and let her know of advances in cancer genetics that may benefit this family.   Our contact number was provided. [name] questions were  answered to her satisfaction, and she knows she is welcome to call us at anytime with additional questions or concerns.   Clint Guy, MS, [facility] Genetic Counselor [email or web address] Phone: [phone]

## 2020-01-13 ENCOUNTER — Other Ambulatory Visit: Payer: BC Managed Care – PPO

## 2020-01-13 ENCOUNTER — Encounter: Payer: Self-pay | Admitting: *Deleted

## 2020-01-21 ENCOUNTER — Other Ambulatory Visit: Payer: Self-pay

## 2020-01-21 ENCOUNTER — Ambulatory Visit
Admission: RE | Admit: 2020-01-21 | Discharge: 2020-01-21 | Disposition: A | Discharge status: Home / Self Care | Payer: BC Managed Care – PPO | Source: Ambulatory Visit | Attending: General Surgery | Admitting: General Surgery

## 2020-01-21 ENCOUNTER — Other Ambulatory Visit: Payer: Self-pay | Admitting: General Surgery

## 2020-01-21 ENCOUNTER — Other Ambulatory Visit (HOSPITAL_COMMUNITY): Payer: Self-pay | Admitting: Diagnostic Radiology

## 2020-01-21 DIAGNOSIS — R9389 Abnormal findings on diagnostic imaging of other specified body structures: Secondary | ICD-10-CM

## 2020-01-21 IMAGING — MR MR BREAST BX W/ LOC DEV 1ST LEASION IMAGE BX SPEC MR GUIDE*R*
7 of 10 series · 30 of 48 positions shown · IV contrast (7 ml gadavist)
Comparison: Previous exams.
COMPARISON: Previous exams.

Addendum:
CLINICAL DATA: 53-year-old female for tissue sampling of non
masslike enhancement within the anterior UPPER RIGHT breast and non
masslike enhancement within the UPPER-OUTER RIGHT breast. Recent
diagnosis of LEFT breast cancer.

EXAM:
MRI GUIDED CORE NEEDLE BIOPSY OF THE RIGHT BREAST X 2
TECHNIQUE: Multiplanar, multisequence MR imaging of the RIGHT breast was
performed both before and after administration of intravenous
contrast.
CONTRAST:  8mL Gadavist

[Series 2: fiducial unilateral · sagittal · 2.0mm · 1.33mm/px · 3 of 52 slices shown]
[im 1/52]
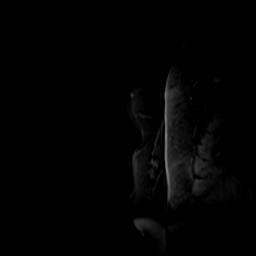
[im 26/52]
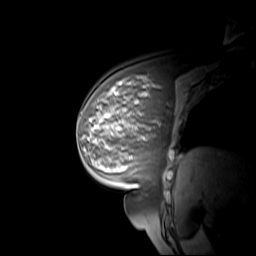
[im 52/52]
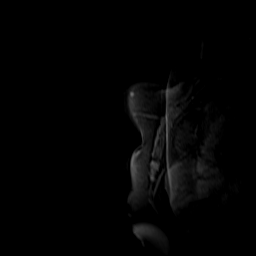

[Series 3: dynamic pre · axial · non-contrast · 1.3mm · 0.73mm/px · z∈[-35,+151]mm · 5 of 144 slices shown]
[im 1/144]
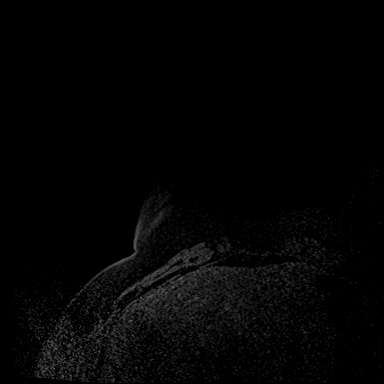
[im 36/144]
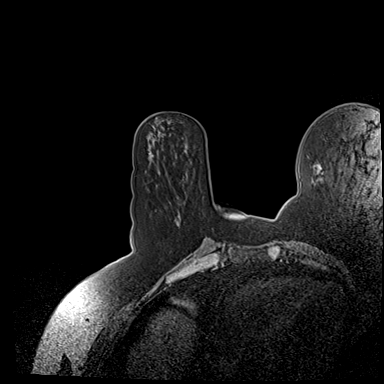
[im 72/144]
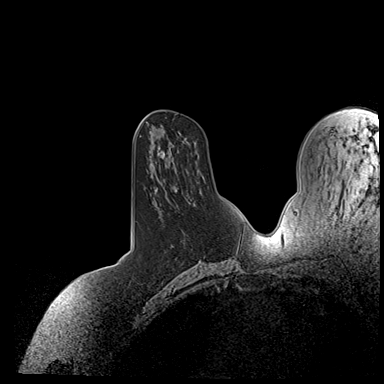
[im 108/144]
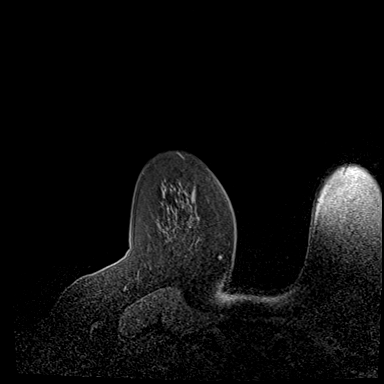
[im 144/144]
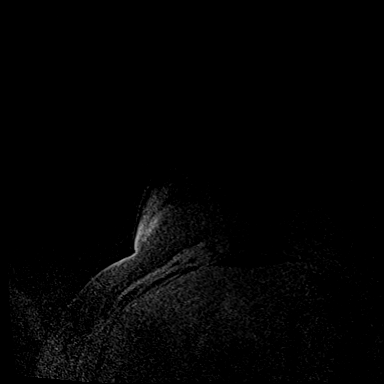

[Series 4: dynamic post 20 · axial · 1.3mm · 0.73mm/px · z∈[-35,+151]mm · 5 of 144 slices shown (1 of 2)]
[im 1/144]
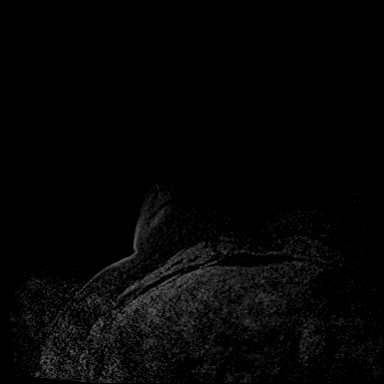
[im 36/144]
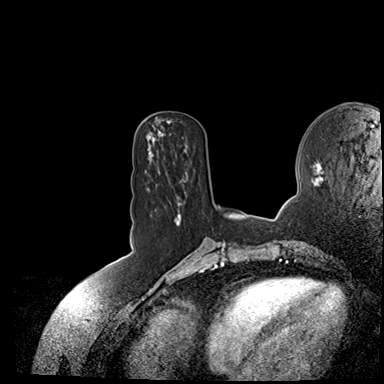
[im 72/144]
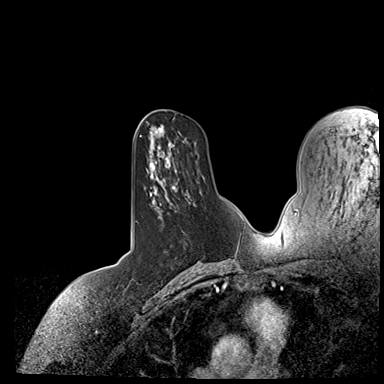
[im 108/144]
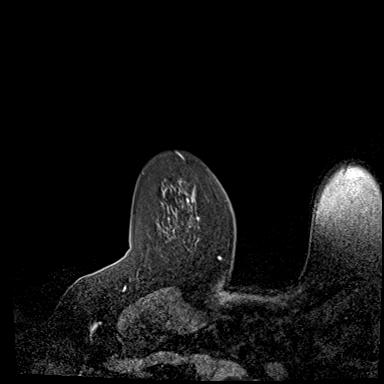
[im 144/144]
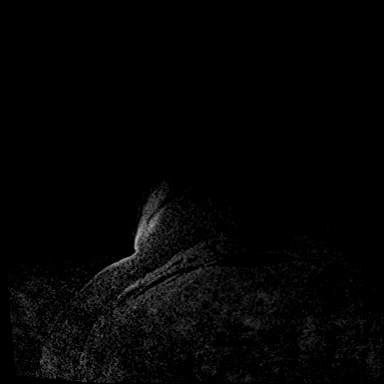

[Series 5: dynamic post 20 · axial · 1.3mm · 0.73mm/px · z∈[-35,+151]mm · 5 of 144 slices shown (2 of 2)]
[im 1/144]
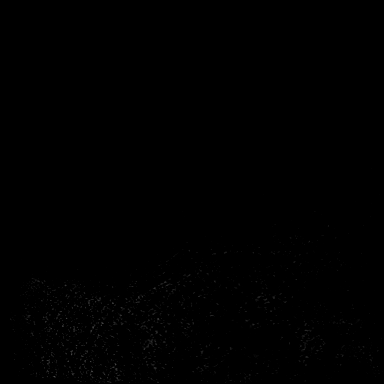
[im 36/144]
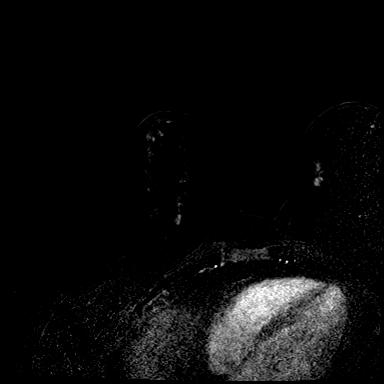
[im 72/144]
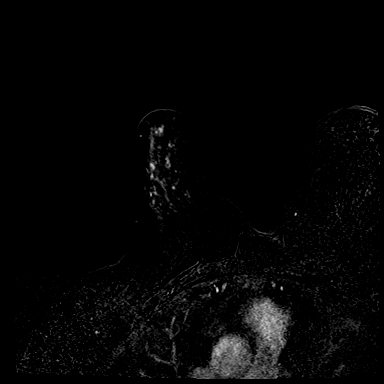
[im 108/144]
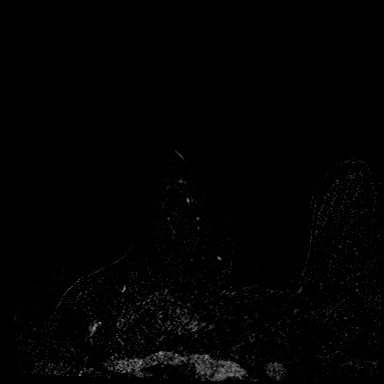
[im 144/144]
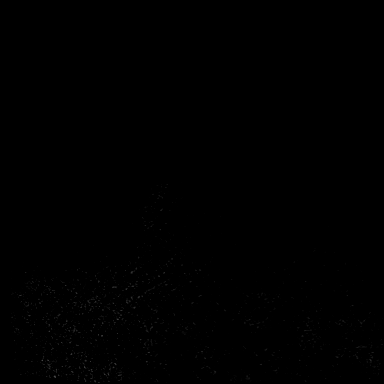

[Series 6: dynamic post 3 · axial · 1.3mm · 0.73mm/px · z∈[-35,+151]mm · 5 of 144 slices shown (1 of 2)]
[im 1/144]
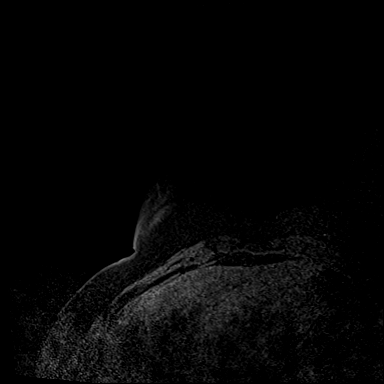
[im 36/144]
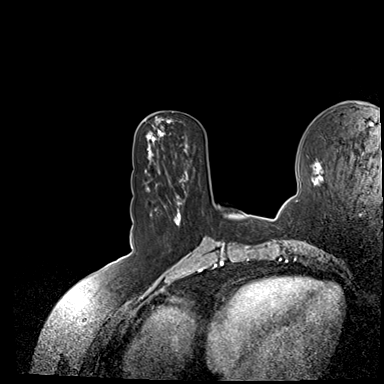
[im 72/144]
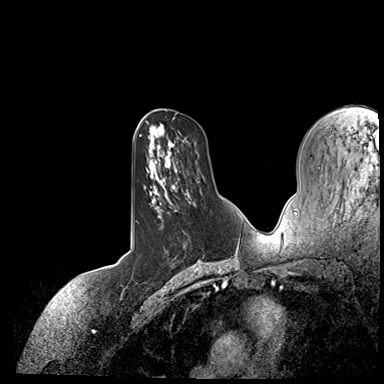
[im 108/144]
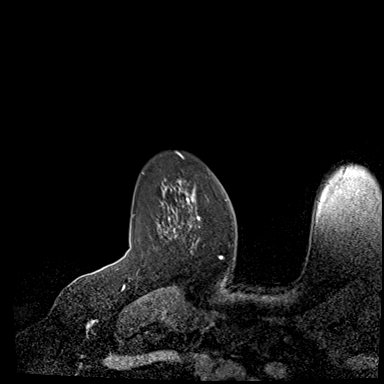
[im 144/144]
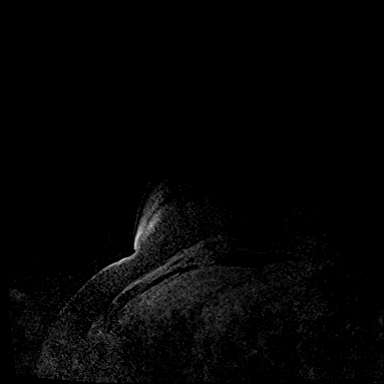

[Series 7: dynamic post 3 · axial · 1.3mm · 0.73mm/px · z∈[-35,+151]mm · 5 of 144 slices shown (2 of 2)]
[im 1/144]
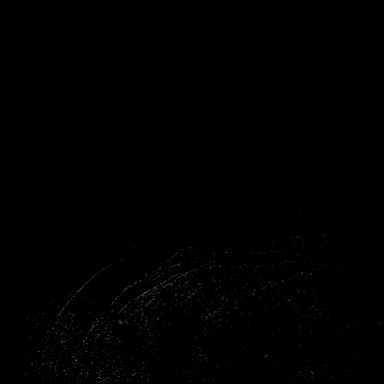
[im 36/144]
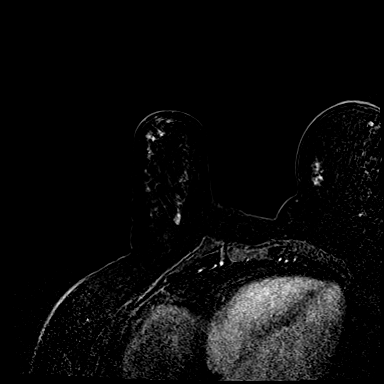
[im 72/144]
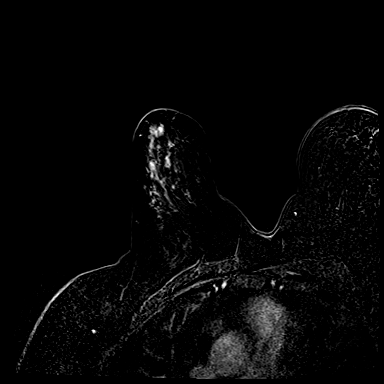
[im 108/144]
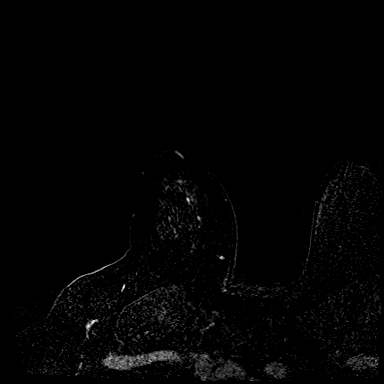
[im 144/144]
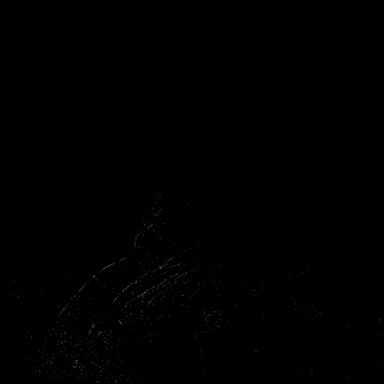

[Series 8: needle confirmation · axial · 1.3mm · 0.73mm/px · z∈[-35,+10]mm · 2 of 144 slices shown]
[im 1/144]
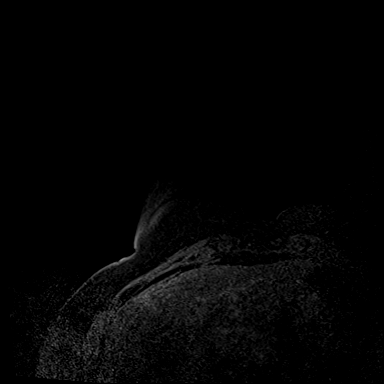
[im 36/144]
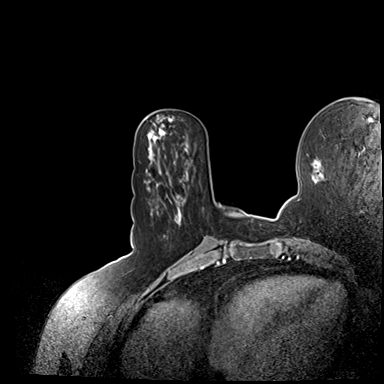

[30 of 48 positions shown; findings below may reference images not displayed]

FINDINGS: I met with the patient, and we discussed the procedure of MRI guided
biopsy, including risks, benefits, and alternatives. Specifically,
we discussed the risks of infection, bleeding, tissue injury, clip
migration, and inadequate sampling. Informed, written consent was
given. The usual time out protocol was performed immediately prior
to the procedure.

MR GUIDED CORE NEEDLE BIOPSY OF THE RIGHT BREAST #1 (anterior UPPER
RIGHT breast-CYLINDER clip):

Using sterile technique, 1% Lidocaine, MRI guidance, and a 9 gauge
vacuum assisted device, biopsy was performed of non masslike
enhancement within the anterior UPPER RIGHT breast using a LATERAL
approach. At the conclusion of the procedure, a CYLINDER tissue
marker clip was deployed into the biopsy cavity. Follow-up 2-view
mammogram was performed and dictated separately.

MR GUIDED CORE NEEDLE BIOPSY OF THE RIGHT BREAST #2 (UPPER-OUTER
RIGHT breast-BARBELL clip):

Using sterile technique, 1% Lidocaine, MRI guidance, and a 9 gauge
vacuum assisted device, biopsy was performed of non masslike
enhancement within the UPPER-OUTER RIGHT breast using a LATERAL
approach. At the conclusion of the procedure, a BARBELL tissue
marker clip was deployed into the biopsy cavity. Follow-up 2-view
mammogram was performed and dictated separately.

Postprocedure MRI demonstrates a small to moderate post biopsy
hematoma within the anterior UPPER RIGHT breast.

A pressure bandage was applied to the biopsy sites following the
procedure and following the post biopsy mammogram due to oozing.
IMPRESSION: MRI guided biopsies of non masslike enhancement within the anterior
UPPER RIGHT breast and within the UPPER-OUTER RIGHT breast. Small to
moderate post biopsy hematoma within the anterior UPPER RIGHT
breast.

ADDENDUM:
Pathology revealed COMPLEX SCLEROSING LESION of the RIGHT breast,
both locations, anterior upper and upper outer. This was found to be
concordant by Dr. NALDOZA, with excision of both areas recommended.

Pathology results were discussed with the patient by telephone. The
patient reported doing well after the biopsies with tenderness and
bleeding at the sites. Post biopsy instructions and care were
reviewed and questions were answered. The patient was encouraged to
call The [REDACTED] for any additional
concerns.

The patient has a recent diagnosis of LEFT breast cancer and should
follow her outlined treatment plan.

Pathology results reported by NALDOZA, RN on [DATE].

*** End of Addendum ***
FINDINGS: I met with the patient, and we discussed the procedure of MRI guided
biopsy, including risks, benefits, and alternatives. Specifically,
we discussed the risks of infection, bleeding, tissue injury, clip
migration, and inadequate sampling. Informed, written consent was
given. The usual time out protocol was performed immediately prior
to the procedure.

MR GUIDED CORE NEEDLE BIOPSY OF THE RIGHT BREAST #1 (anterior UPPER
RIGHT breast-CYLINDER clip):

Using sterile technique, 1% Lidocaine, MRI guidance, and a 9 gauge
vacuum assisted device, biopsy was performed of non masslike
enhancement within the anterior UPPER RIGHT breast using a LATERAL
approach. At the conclusion of the procedure, a CYLINDER tissue
marker clip was deployed into the biopsy cavity. Follow-up 2-view
mammogram was performed and dictated separately.

MR GUIDED CORE NEEDLE BIOPSY OF THE RIGHT BREAST #2 (UPPER-OUTER
RIGHT breast-BARBELL clip):

Using sterile technique, 1% Lidocaine, MRI guidance, and a 9 gauge
vacuum assisted device, biopsy was performed of non masslike
enhancement within the UPPER-OUTER RIGHT breast using a LATERAL
approach. At the conclusion of the procedure, a BARBELL tissue
marker clip was deployed into the biopsy cavity. Follow-up 2-view
mammogram was performed and dictated separately.

Postprocedure MRI demonstrates a small to moderate post biopsy
hematoma within the anterior UPPER RIGHT breast.

A pressure bandage was applied to the biopsy sites following the
procedure and following the post biopsy mammogram due to oozing.
IMPRESSION: MRI guided biopsies of non masslike enhancement within the anterior
UPPER RIGHT breast and within the UPPER-OUTER RIGHT breast. Small to
moderate post biopsy hematoma within the anterior UPPER RIGHT
breast.

## 2020-01-21 IMAGING — MG MM BREAST LOCALIZATION CLIP
3 series · 4 of 7 positions shown · non-contrast
Comparison: Previous exam(s).

CLINICAL DATA: Evaluate biopsy marker clips following 2 MR guided
RIGHT breast biopsies.

EXAM:
DIAGNOSTIC RIGHT MAMMOGRAM POST MRI BIOPSY

[R ML synth-2D]
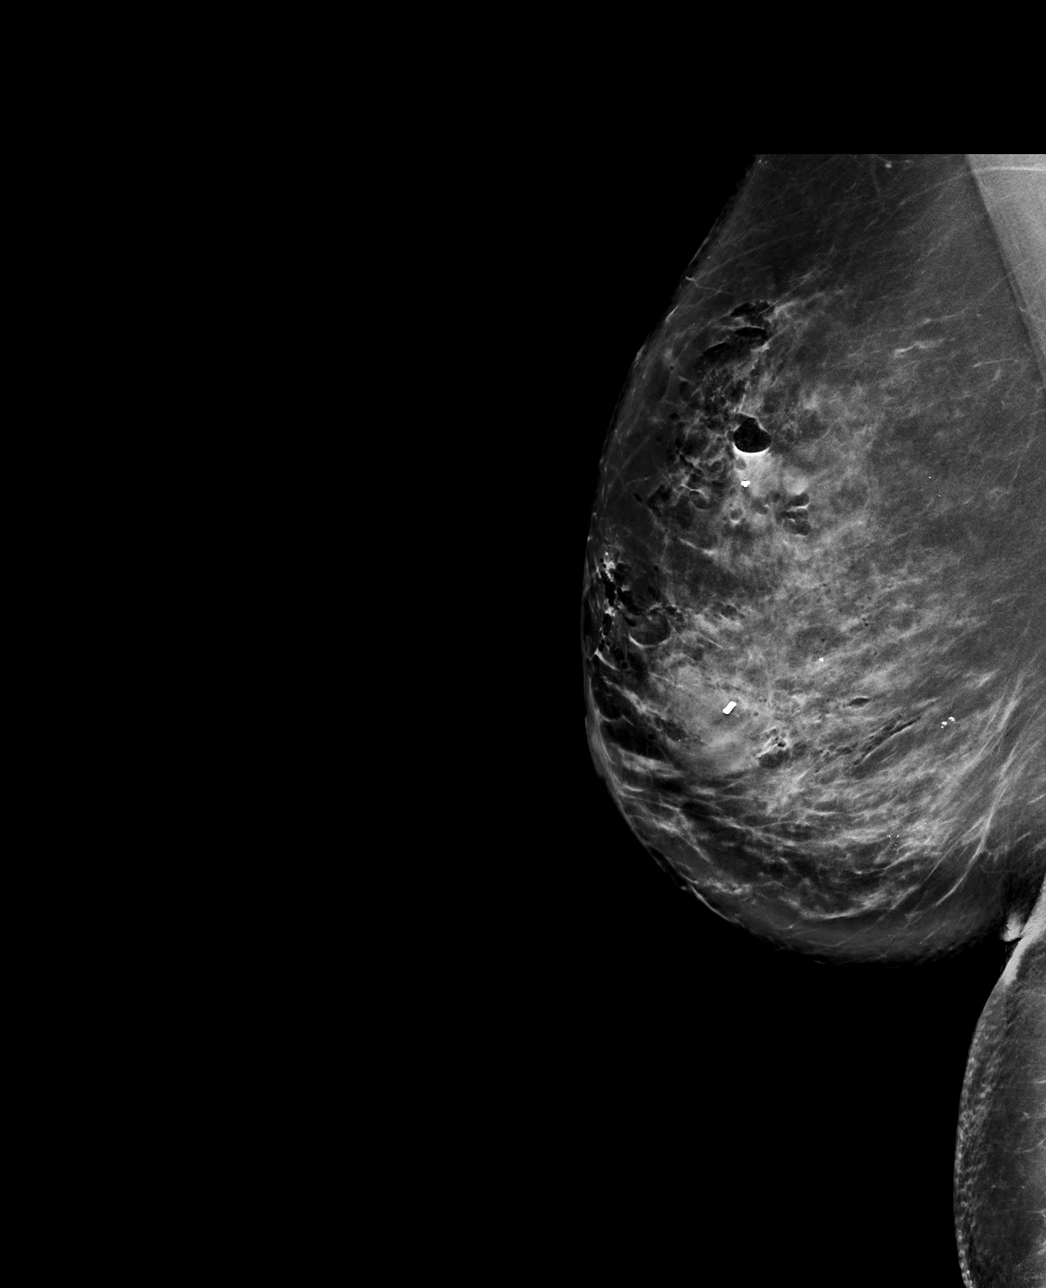

[R CC synth-2D]
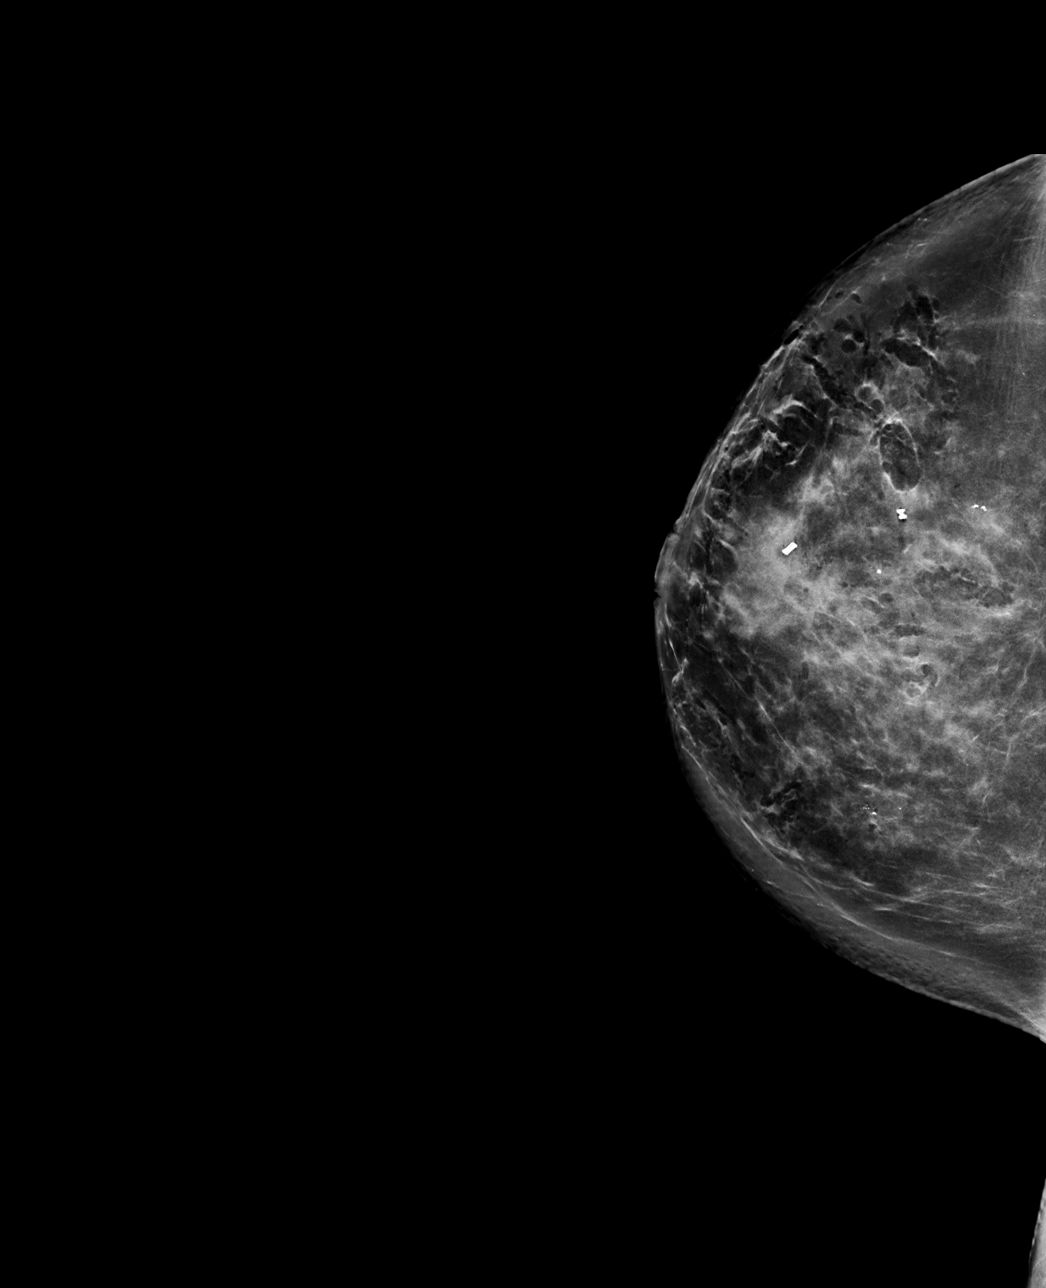

[R CC tomo · 2 of 101 frames shown]
[frame 33/101]
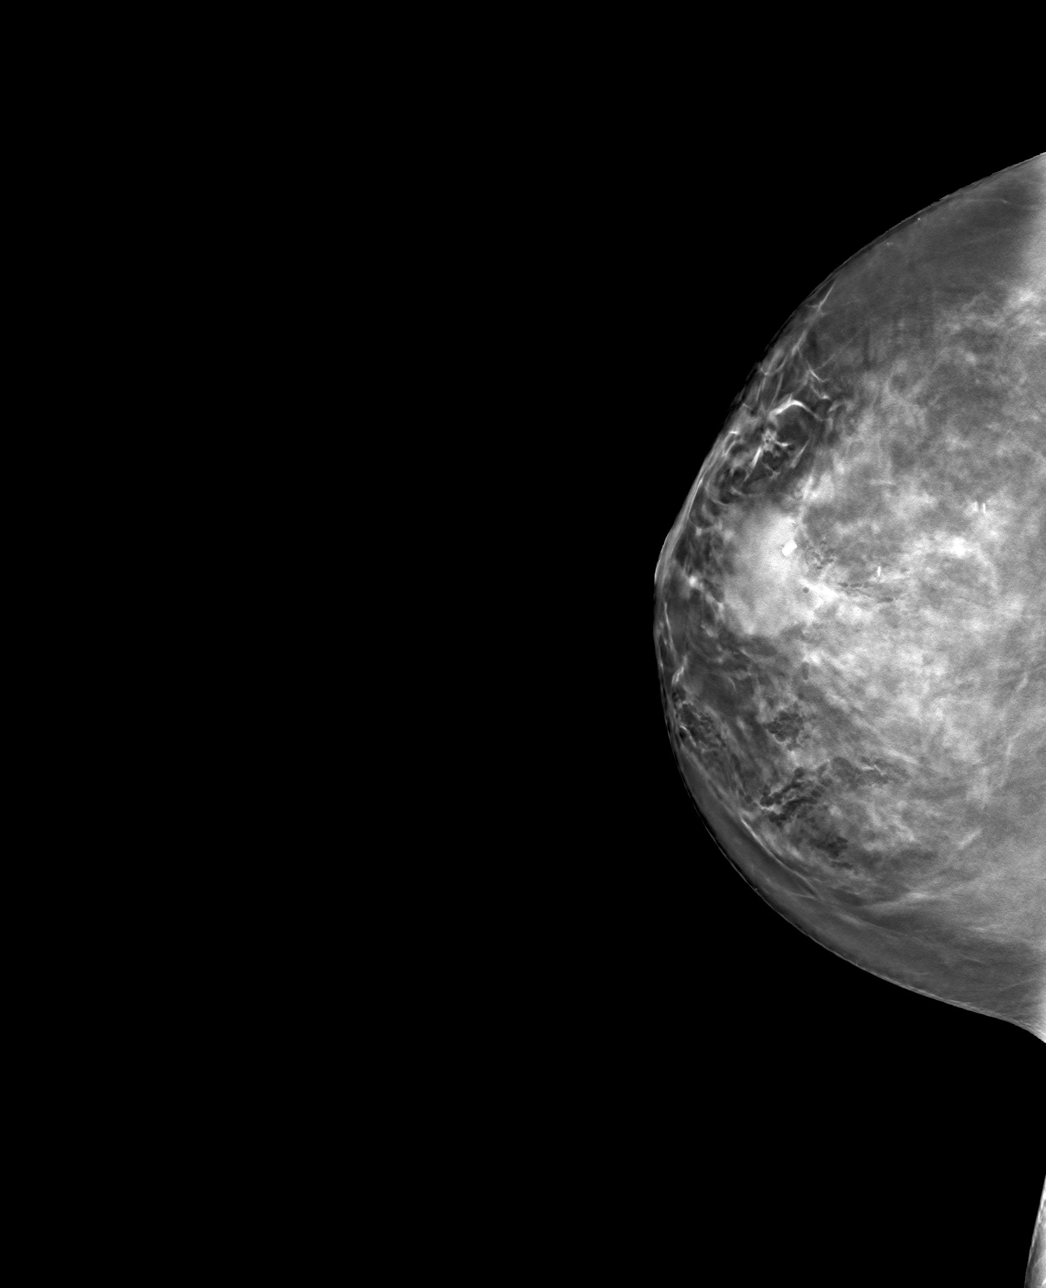
[frame 51/101]
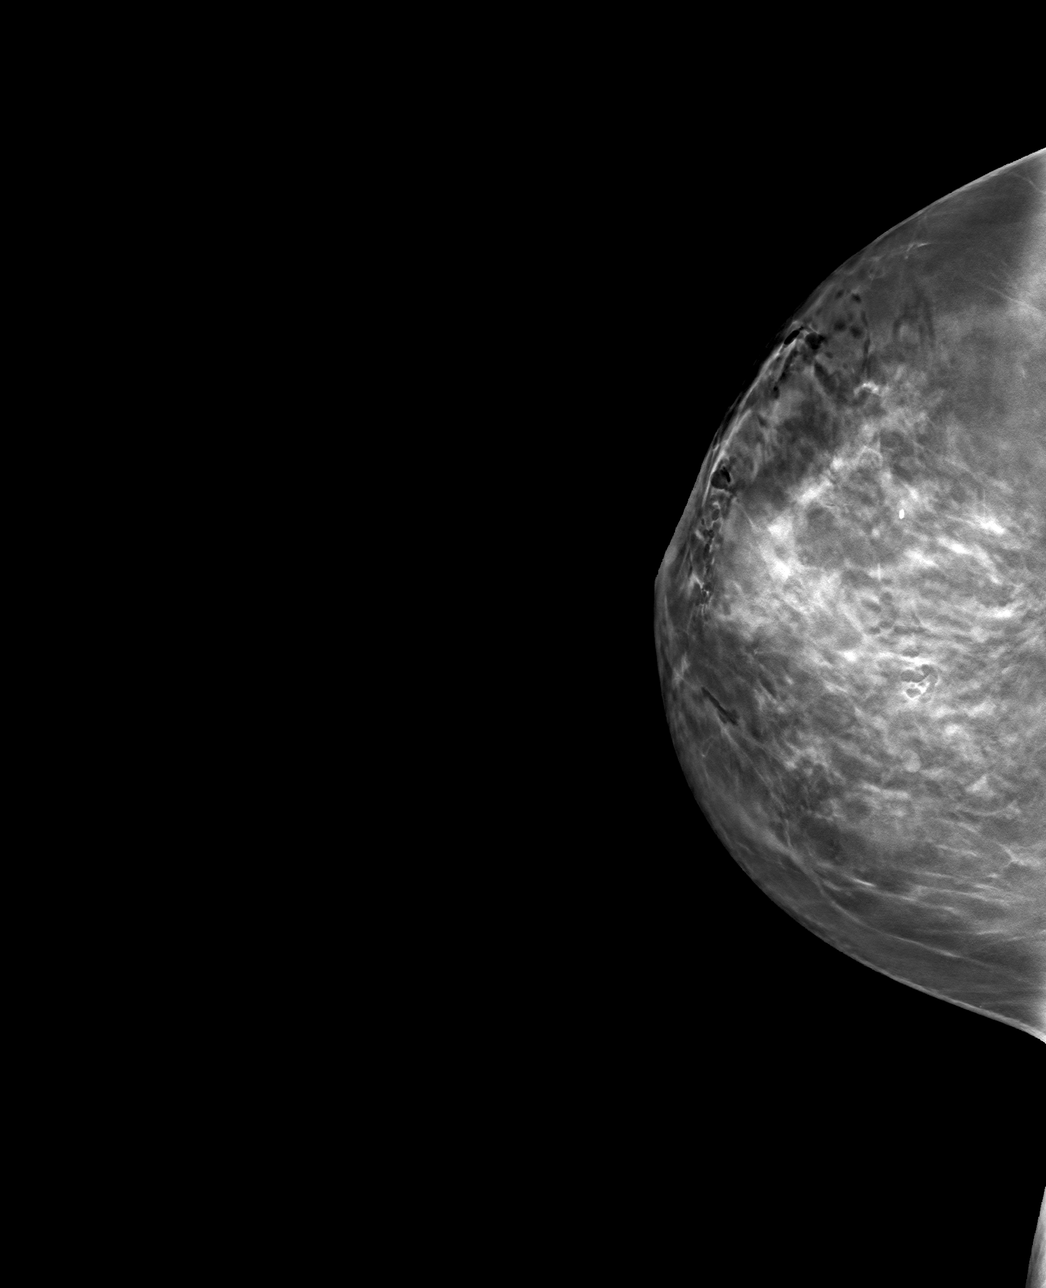

[4 of 7 positions shown; findings below may reference images not displayed]

FINDINGS: Mammographic images were obtained following MR guided biopsy of non
masslike enhancement within the anterior UPPER RIGHT breast and non
masslike enhancement within the UPPER-OUTER RIGHT breast.

The CYLINDER biopsy marking clip is in expected position at the site
of biopsy within the anterior UPPER RIGHT breast. A small post
biopsy hematoma in this area is noted.

The BARBELL biopsy marking clip is in expected position at the site
of biopsy within the UPPER-OUTER RIGHT breast.

The 2 clips are separated by a distance of 4.5 cm in the
craniocaudal dimension.
IMPRESSION: Appropriate positioning of the CYLINDER shaped biopsy marking clip
at the site of biopsy in the anterior UPPER RIGHT breast.

Appropriate positioning of the BARBELL shaped biopsy marking clip at
the site of biopsy in the UPPER-OUTER RIGHT breast.

Small post biopsy hematoma within the anterior UPPER RIGHT breast.

Final Assessment: Post Procedure Mammograms for Marker Placement

## 2020-01-21 IMAGING — MR MR BREAST BX W/ LOC DEV EA ADD LESION IMAGE BX SPEC MR GUIDE*R*
1 series · 10 of 10 positions shown · IV contrast (gadavist)
Comparison: Previous exams.
COMPARISON: Previous exams.

Addendum:
CLINICAL DATA: 53-year-old female for tissue sampling of non
masslike enhancement within the anterior UPPER RIGHT breast and non
masslike enhancement within the UPPER-OUTER RIGHT breast. Recent
diagnosis of LEFT breast cancer.

EXAM:
MRI GUIDED CORE NEEDLE BIOPSY OF THE RIGHT BREAST X 2
TECHNIQUE: Multiplanar, multisequence MR imaging of the RIGHT breast was
performed both before and after administration of intravenous
contrast.
CONTRAST:  8mL Gadavist

[Series 2: localizer · axial · 10.0mm · 0.78mm/px · z∈[-24,+200]mm · 10 of 10 slices shown]
[im 1/10]
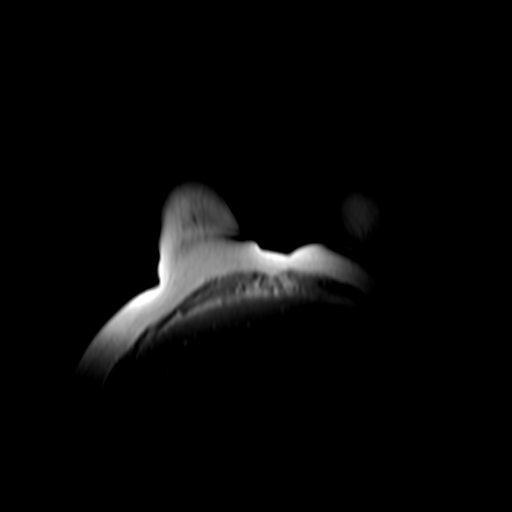
[im 2/10]
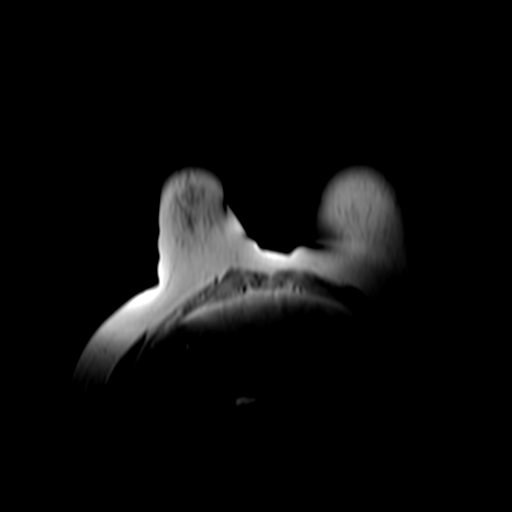
[im 3/10]
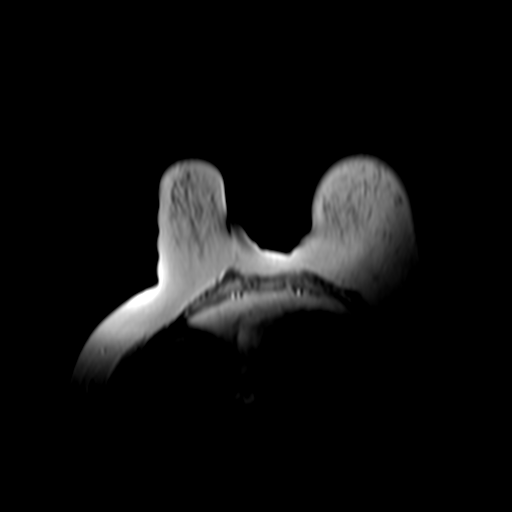
[im 4/10]
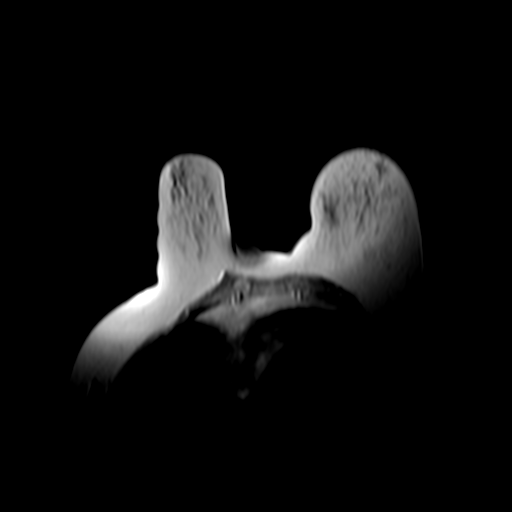
[im 5/10]
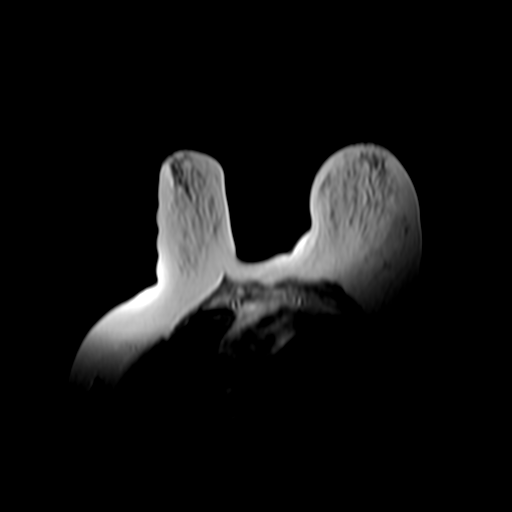
[im 6/10]
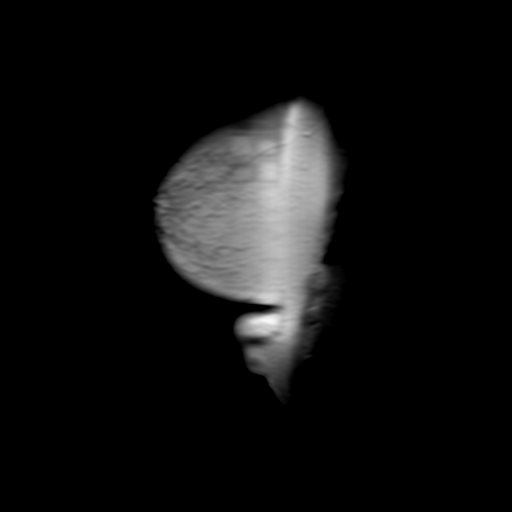
[im 7/10]
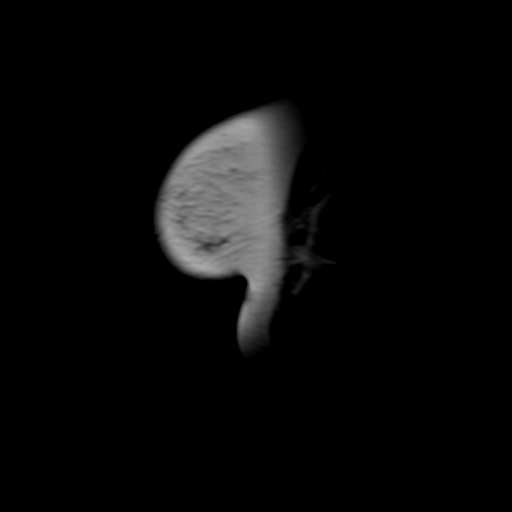
[im 8/10]
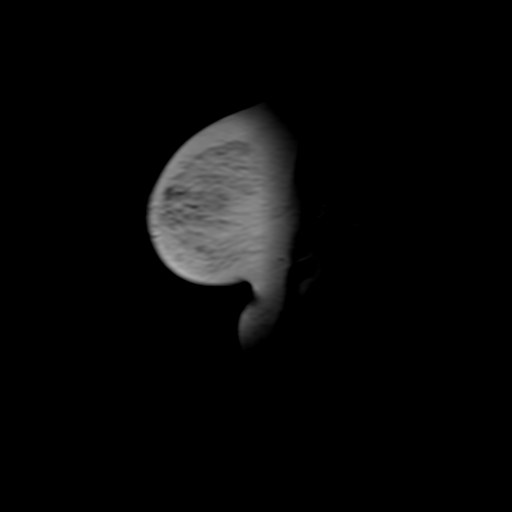
[im 9/10]
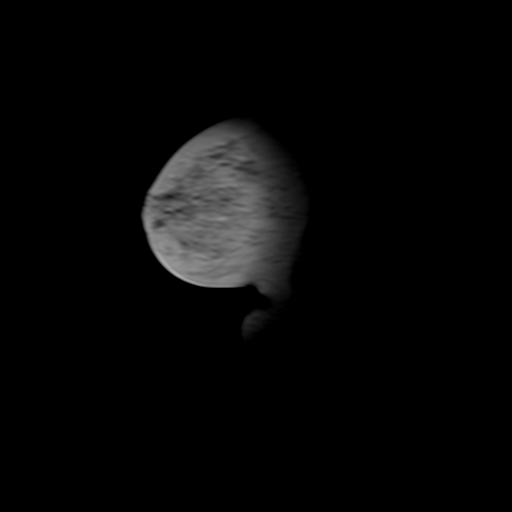
[im 10/10]
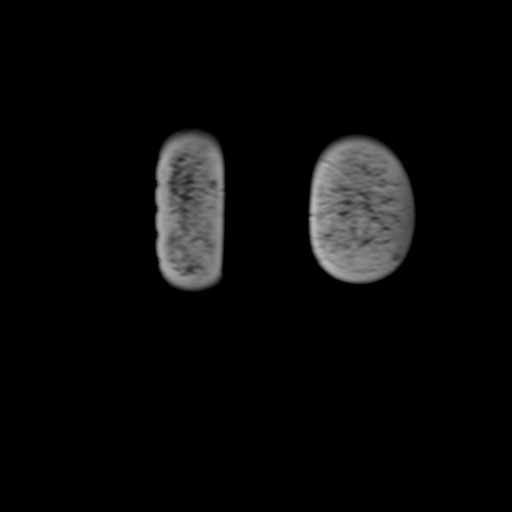

[10 of 10 positions shown; findings below may reference images not displayed]

FINDINGS: I met with the patient, and we discussed the procedure of MRI guided
biopsy, including risks, benefits, and alternatives. Specifically,
we discussed the risks of infection, bleeding, tissue injury, clip
migration, and inadequate sampling. Informed, written consent was
given. The usual time out protocol was performed immediately prior
to the procedure.

MR GUIDED CORE NEEDLE BIOPSY OF THE RIGHT BREAST #1 (anterior UPPER
RIGHT breast-CYLINDER clip):

Using sterile technique, 1% Lidocaine, MRI guidance, and a 9 gauge
vacuum assisted device, biopsy was performed of non masslike
enhancement within the anterior UPPER RIGHT breast using a LATERAL
approach. At the conclusion of the procedure, a CYLINDER tissue
marker clip was deployed into the biopsy cavity. Follow-up 2-view
mammogram was performed and dictated separately.

MR GUIDED CORE NEEDLE BIOPSY OF THE RIGHT BREAST #2 (UPPER-OUTER
RIGHT breast-BARBELL clip):

Using sterile technique, 1% Lidocaine, MRI guidance, and a 9 gauge
vacuum assisted device, biopsy was performed of non masslike
enhancement within the UPPER-OUTER RIGHT breast using a LATERAL
approach. At the conclusion of the procedure, a BARBELL tissue
marker clip was deployed into the biopsy cavity. Follow-up 2-view
mammogram was performed and dictated separately.

Postprocedure MRI demonstrates a small to moderate post biopsy
hematoma within the anterior UPPER RIGHT breast.

A pressure bandage was applied to the biopsy sites following the
procedure and following the post biopsy mammogram due to oozing.
IMPRESSION: MRI guided biopsies of non masslike enhancement within the anterior
UPPER RIGHT breast and within the UPPER-OUTER RIGHT breast. Small to
moderate post biopsy hematoma within the anterior UPPER RIGHT
breast.

ADDENDUM:
Pathology revealed COMPLEX SCLEROSING LESION of the RIGHT breast,
both locations, anterior upper and upper outer. This was found to be
concordant by Dr. NALDOZA, with excision of both areas recommended.

Pathology results were discussed with the patient by telephone. The
patient reported doing well after the biopsies with tenderness and
bleeding at the sites. Post biopsy instructions and care were
reviewed and questions were answered. The patient was encouraged to
call The [REDACTED] for any additional
concerns.

The patient has a recent diagnosis of LEFT breast cancer and should
follow her outlined treatment plan.

Pathology results reported by NALDOZA, RN on [DATE].

*** End of Addendum ***
FINDINGS: I met with the patient, and we discussed the procedure of MRI guided
biopsy, including risks, benefits, and alternatives. Specifically,
we discussed the risks of infection, bleeding, tissue injury, clip
migration, and inadequate sampling. Informed, written consent was
given. The usual time out protocol was performed immediately prior
to the procedure.

MR GUIDED CORE NEEDLE BIOPSY OF THE RIGHT BREAST #1 (anterior UPPER
RIGHT breast-CYLINDER clip):

Using sterile technique, 1% Lidocaine, MRI guidance, and a 9 gauge
vacuum assisted device, biopsy was performed of non masslike
enhancement within the anterior UPPER RIGHT breast using a LATERAL
approach. At the conclusion of the procedure, a CYLINDER tissue
marker clip was deployed into the biopsy cavity. Follow-up 2-view
mammogram was performed and dictated separately.

MR GUIDED CORE NEEDLE BIOPSY OF THE RIGHT BREAST #2 (UPPER-OUTER
RIGHT breast-BARBELL clip):

Using sterile technique, 1% Lidocaine, MRI guidance, and a 9 gauge
vacuum assisted device, biopsy was performed of non masslike
enhancement within the UPPER-OUTER RIGHT breast using a LATERAL
approach. At the conclusion of the procedure, a BARBELL tissue
marker clip was deployed into the biopsy cavity. Follow-up 2-view
mammogram was performed and dictated separately.

Postprocedure MRI demonstrates a small to moderate post biopsy
hematoma within the anterior UPPER RIGHT breast.

A pressure bandage was applied to the biopsy sites following the
procedure and following the post biopsy mammogram due to oozing.
IMPRESSION: MRI guided biopsies of non masslike enhancement within the anterior
UPPER RIGHT breast and within the UPPER-OUTER RIGHT breast. Small to
moderate post biopsy hematoma within the anterior UPPER RIGHT
breast.

## 2020-01-21 MED ORDER — GADOBUTROL 1 MMOL/ML IV SOLN
8.0000 mL | Freq: Once | INTRAVENOUS | Status: AC | PRN
  Administered 2020-01-21: 8 mL via INTRAVENOUS

## 2020-01-24 ENCOUNTER — Encounter: Payer: Self-pay | Admitting: *Deleted

## 2020-01-24 ENCOUNTER — Other Ambulatory Visit: Payer: Self-pay | Admitting: General Surgery

## 2020-01-24 DIAGNOSIS — Z170 Estrogen receptor positive status [ER+]: Secondary | ICD-10-CM

## 2020-01-24 DIAGNOSIS — C50312 Malignant neoplasm of lower-inner quadrant of left female breast: Secondary | ICD-10-CM

## 2020-01-24 DIAGNOSIS — R928 Other abnormal and inconclusive findings on diagnostic imaging of breast: Secondary | ICD-10-CM

## 2020-01-25 ENCOUNTER — Encounter: Payer: Self-pay | Admitting: *Deleted

## 2020-01-25 ENCOUNTER — Other Ambulatory Visit: Payer: Self-pay | Admitting: General Surgery

## 2020-01-25 DIAGNOSIS — R928 Other abnormal and inconclusive findings on diagnostic imaging of breast: Secondary | ICD-10-CM

## 2020-01-25 DIAGNOSIS — Z170 Estrogen receptor positive status [ER+]: Secondary | ICD-10-CM

## 2020-01-25 DIAGNOSIS — C50312 Malignant neoplasm of lower-inner quadrant of left female breast: Secondary | ICD-10-CM

## 2020-01-25 DIAGNOSIS — D0512 Intraductal carcinoma in situ of left breast: Secondary | ICD-10-CM

## 2020-01-31 ENCOUNTER — Telehealth: Payer: Self-pay | Admitting: *Deleted

## 2020-01-31 ENCOUNTER — Encounter: Payer: Self-pay | Admitting: *Deleted

## 2020-01-31 NOTE — Telephone Encounter (Signed)
Made several attempts to reach patient and the contact person. Unable to leave message , mailbox is full.

## 2020-02-07 ENCOUNTER — Other Ambulatory Visit: Payer: Self-pay

## 2020-02-07 ENCOUNTER — Encounter (HOSPITAL_BASED_OUTPATIENT_CLINIC_OR_DEPARTMENT_OTHER): Payer: Self-pay | Admitting: General Surgery

## 2020-02-11 ENCOUNTER — Other Ambulatory Visit (HOSPITAL_COMMUNITY)
Admission: RE | Admit: 2020-02-11 | Discharge: 2020-02-11 | Disposition: A | Discharge status: Home / Self Care | Payer: BC Managed Care – PPO | Source: Ambulatory Visit | Attending: General Surgery | Admitting: General Surgery

## 2020-02-11 ENCOUNTER — Encounter (HOSPITAL_BASED_OUTPATIENT_CLINIC_OR_DEPARTMENT_OTHER)
Admission: RE | Admit: 2020-02-11 | Discharge: 2020-02-11 | Disposition: A | Discharge status: Home / Self Care | Payer: BC Managed Care – PPO | Source: Ambulatory Visit | Attending: General Surgery | Admitting: General Surgery

## 2020-02-11 DIAGNOSIS — Z20822 Contact with and (suspected) exposure to covid-19: Secondary | ICD-10-CM | POA: Insufficient documentation

## 2020-02-11 DIAGNOSIS — Z01818 Encounter for other preprocedural examination: Secondary | ICD-10-CM | POA: Insufficient documentation

## 2020-02-11 LAB — SARS CORONAVIRUS 2 (TAT 6-24 HRS): SARS Coronavirus 2: NEGATIVE

## 2020-02-11 MED ORDER — ENSURE PRE-SURGERY PO LIQD: 296.0000 mL | Freq: Once | ORAL | Status: DC

## 2020-02-11 NOTE — Progress Notes (Signed)

## 2020-02-14 ENCOUNTER — Other Ambulatory Visit: Payer: Self-pay

## 2020-02-14 ENCOUNTER — Ambulatory Visit
Admission: RE | Admit: 2020-02-14 | Discharge: 2020-02-14 | Disposition: A | Discharge status: Home / Self Care | Payer: BC Managed Care – PPO | Source: Ambulatory Visit | Attending: General Surgery | Admitting: General Surgery

## 2020-02-14 DIAGNOSIS — R928 Other abnormal and inconclusive findings on diagnostic imaging of breast: Secondary | ICD-10-CM

## 2020-02-14 DIAGNOSIS — C50312 Malignant neoplasm of lower-inner quadrant of left female breast: Secondary | ICD-10-CM

## 2020-02-14 IMAGING — MG MM PLC BREAST LOC DEV 1ST LESION INC MAMMO GUIDE*L*
8 series · 8 of 8 positions shown · non-contrast
Comparison: Previous exam(s).

CLINICAL DATA: 53-year-old female presenting for radioactive seed
localization both breasts for a left lumpectomy and 2 excisional
biopsies of the right breast.

EXAM:
MAMMOGRAPHIC GUIDED RADIOACTIVE SEED LOCALIZATION OF THE BILATERAL
BREAST

[L ML (1 of 4)]
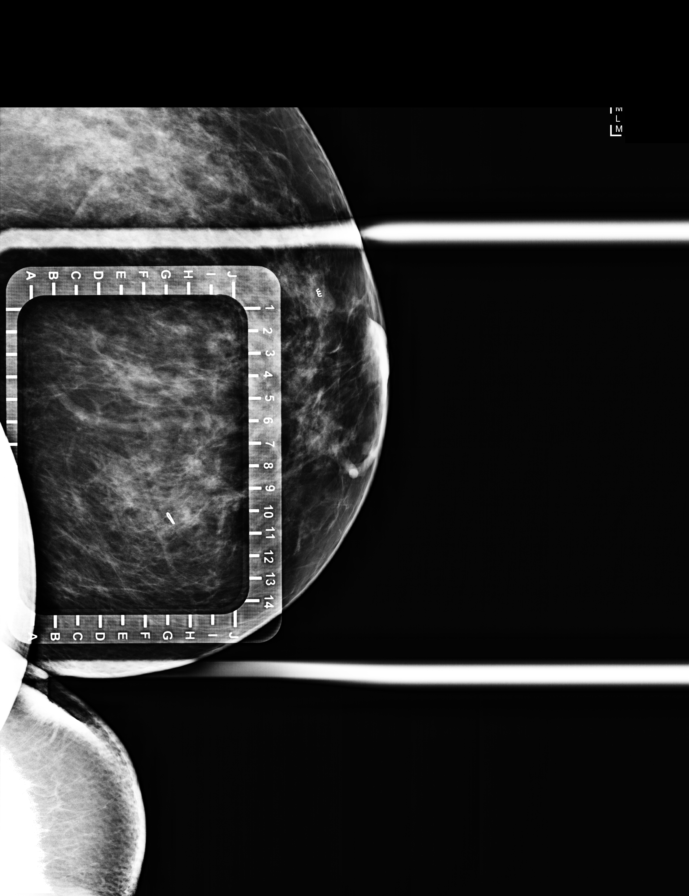

[L CC (1 of 4)]
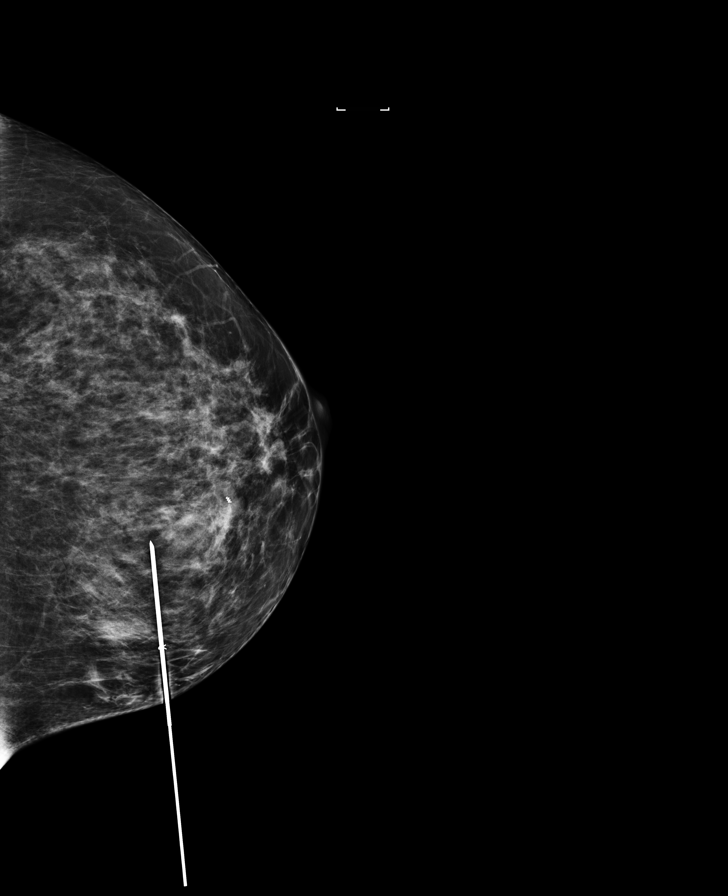

[L ML (2 of 4)]
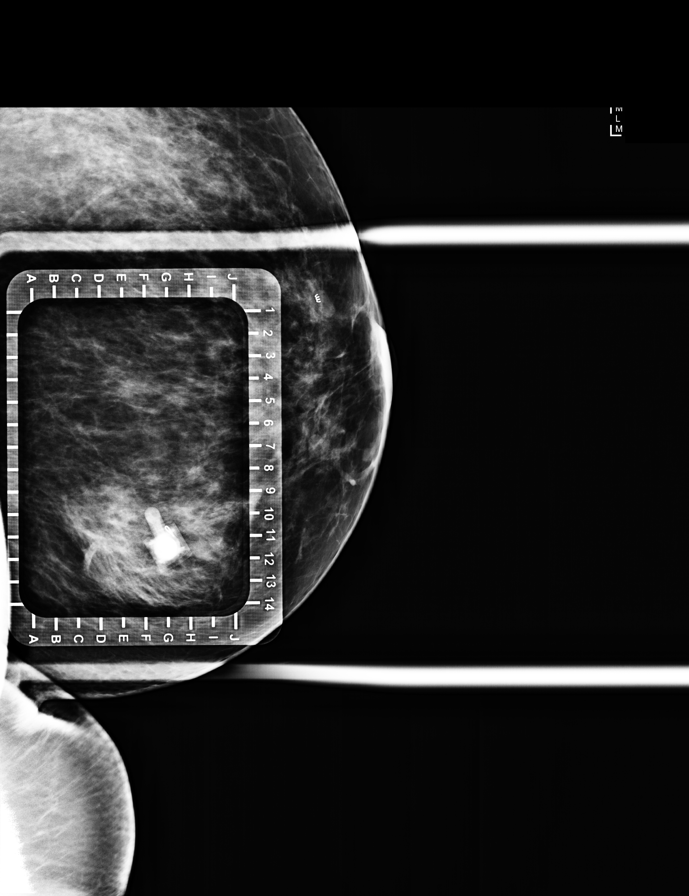

[L ML (3 of 4)]
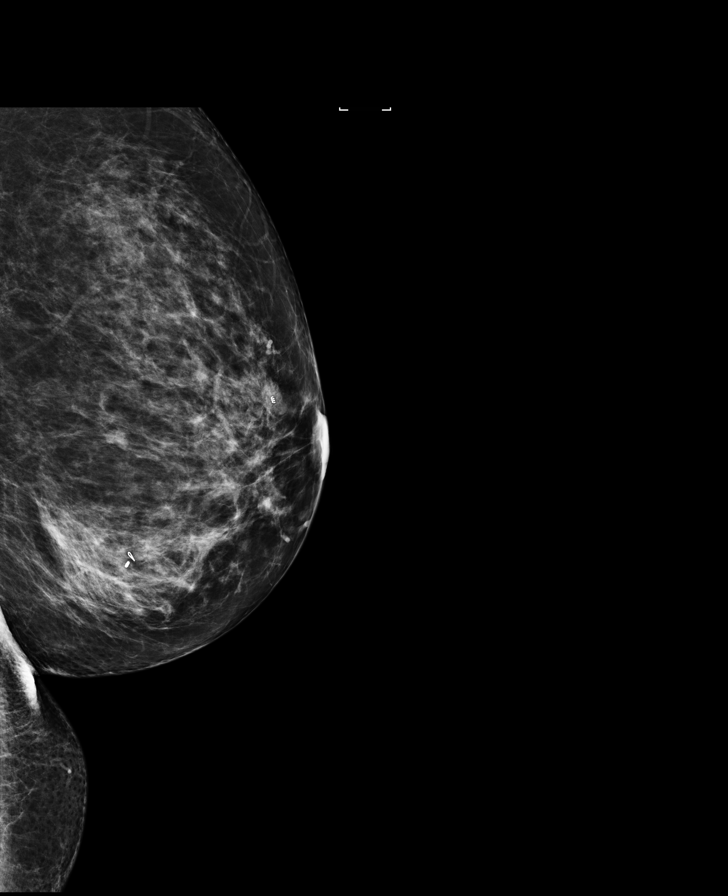

[L CC (2 of 4)]
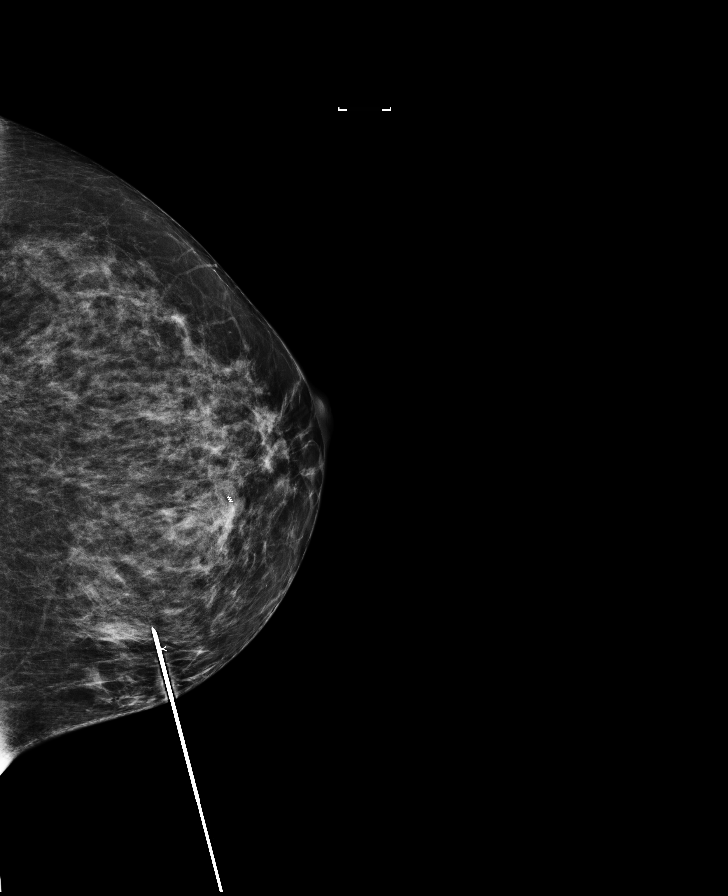

[L CC (3 of 4)]
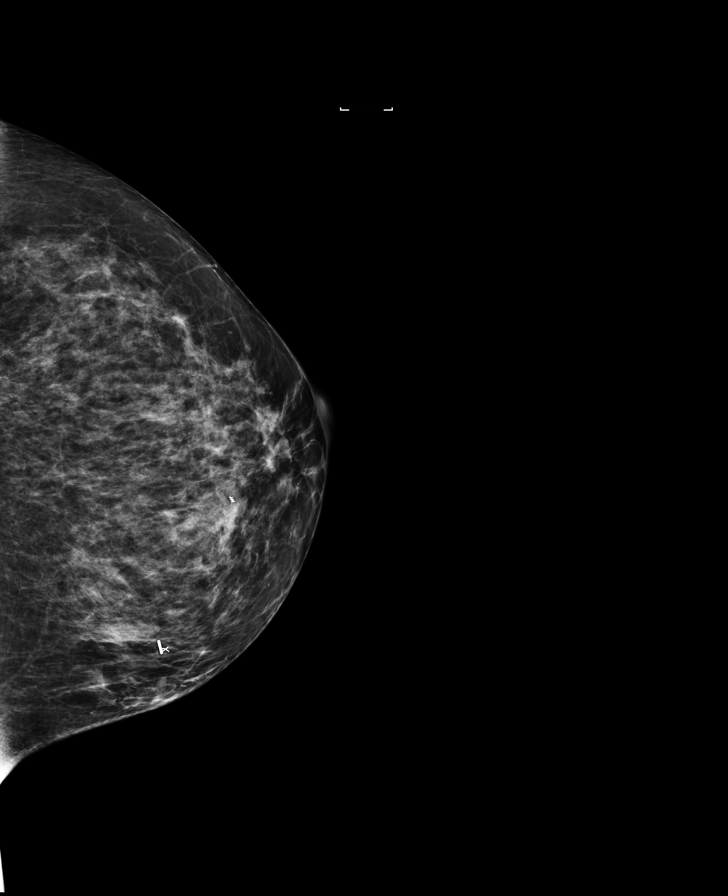

[L CC (4 of 4)]
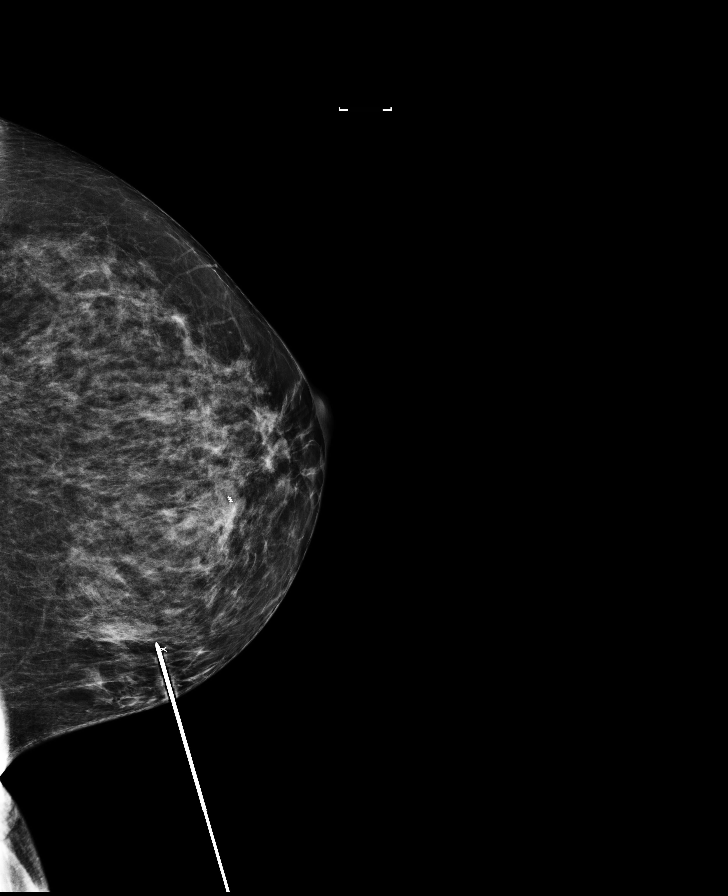

[L ML (4 of 4)]
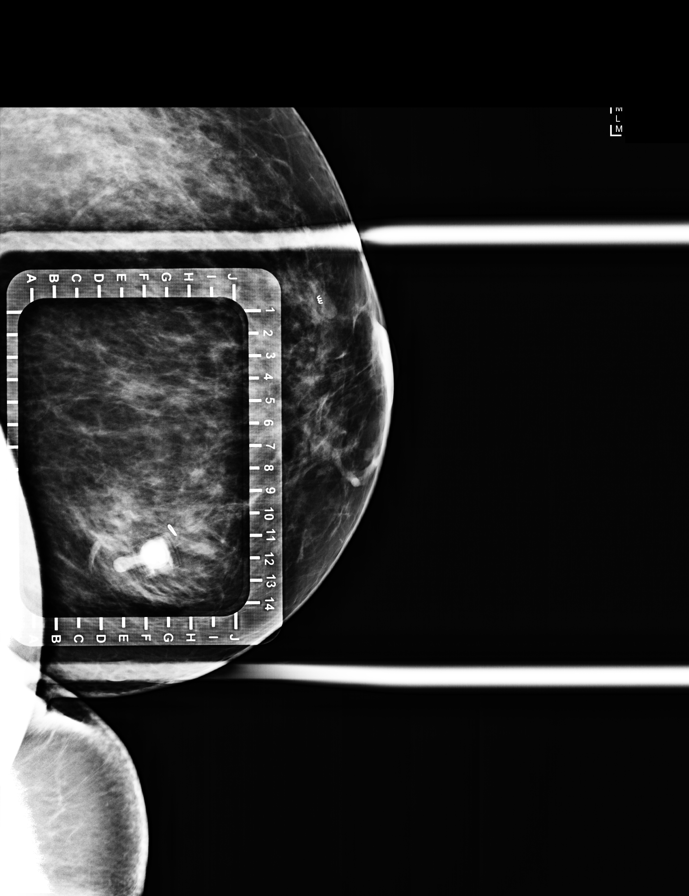

[8 of 8 positions shown; findings below may reference images not displayed]

FINDINGS: Patient presents for radioactive seed localization prior to left
breast lumpectomy 2 excisional right breast biopsies. I met with
the patient and we discussed the procedure of seed localization
including benefits and alternatives. We discussed the high
likelihood of a successful procedure. We discussed the risks of the
procedure including infection, bleeding, tissue injury and further
surgery. We discussed the low dose of radioactivity involved in the
procedure. Informed, written consent was given.

The usual time-out protocol was performed immediately prior to the
procedure.

Using mammographic guidance, sterile technique, 1% lidocaine and an
[Y4] radioactive seed, the ribbon shaped biopsy marking clip in the
medial left breast was localized using a medial approach. The
follow-up mammogram images confirm the seed in the expected location
and were marked for Dr. BANSAL CYPRUS.

Follow-up survey of the patient confirms presence of the radioactive
seed.

Order number of [Y4] seed:  [PHONE_NUMBER].

Total activity:  0.250 millicuries reference Date: [DATE]

----------------------------------------------

Using mammographic guidance, sterile technique, 1% lidocaine and an
[Y4] radioactive seed, the dumbbell-shaped biopsy marking clip in
the upper-outer posterior right breast was localized using a
superior approach. The follow-up mammogram images confirm the seed
in the expected location and were marked for Dr. BANSAL CYPRUS.

Follow-up survey of the patient confirms presence of the radioactive
seed.

Order number of [Y4] seed:  [PHONE_NUMBER].

Total activity:  0.250 millicuries reference Date: [DATE]

----------------------------------------------

Using mammographic guidance, sterile technique, 1% lidocaine and an
[Y4] radioactive seed, the cylinder shaped biopsy marking clip in
the upper outer anterior right breast was localized using a superior
approach. The follow-up mammogram images confirm the seed in the
expected location and were marked for Dr. BANSAL CYPRUS.

Follow-up survey of the patient confirms presence of the radioactive
seed.

Order number of [Y4] seed:  [PHONE_NUMBER].

Total activity:  0.250 millicuries reference Date: [DATE]

The patient tolerated the procedure well and was released from the
[REDACTED]. She was given instructions regarding seed removal.
IMPRESSION: Radioactive seed localization of 1 site in the left breast and 2
sites in the right breast. No apparent complications.

## 2020-02-14 IMAGING — MG MM PLC BREAST LOC DEV 1ST LESION INC*R*
8 of 10 series · 8 of 14 positions shown · non-contrast
Comparison: Previous exam(s).

CLINICAL DATA: 53-year-old female presenting for radioactive seed
localization both breasts for a left lumpectomy and 2 excisional
biopsies of the right breast.

EXAM:
MAMMOGRAPHIC GUIDED RADIOACTIVE SEED LOCALIZATION OF THE BILATERAL
BREAST

[R CC (1 of 4)]
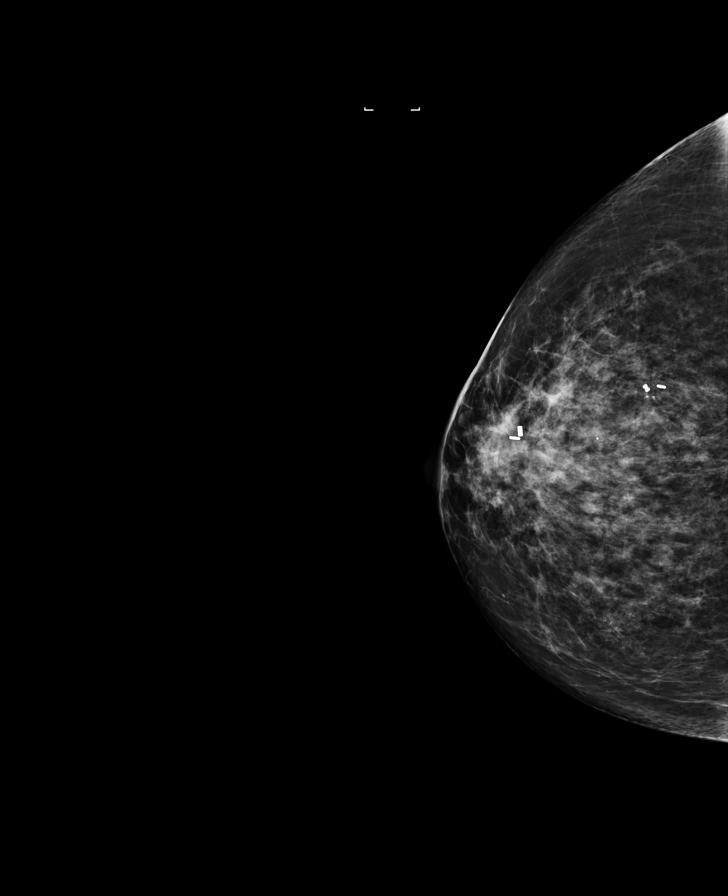

[R CC (2 of 4)]
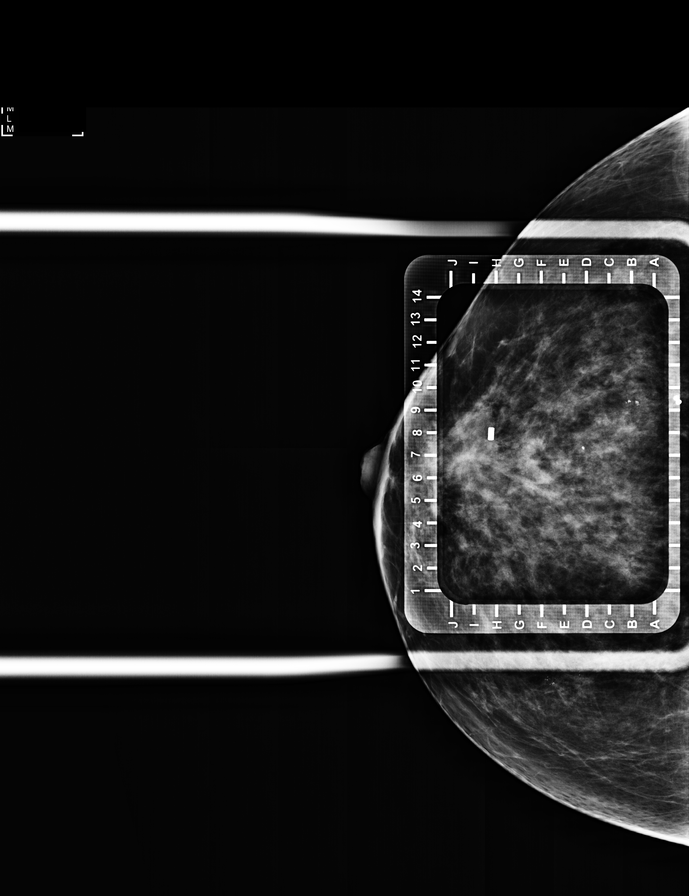

[R CC (3 of 4)]
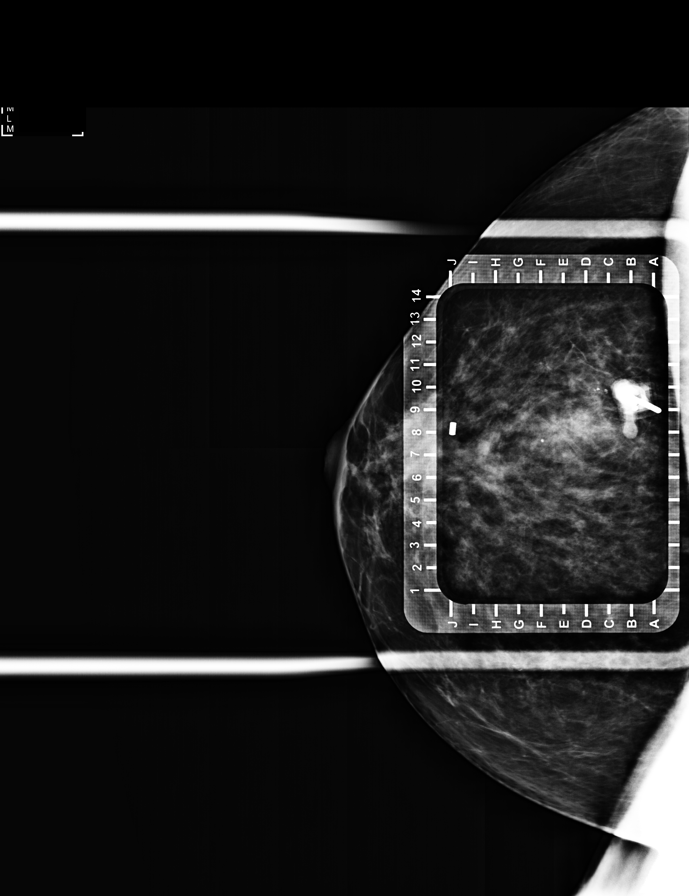

[R CC (4 of 4)]
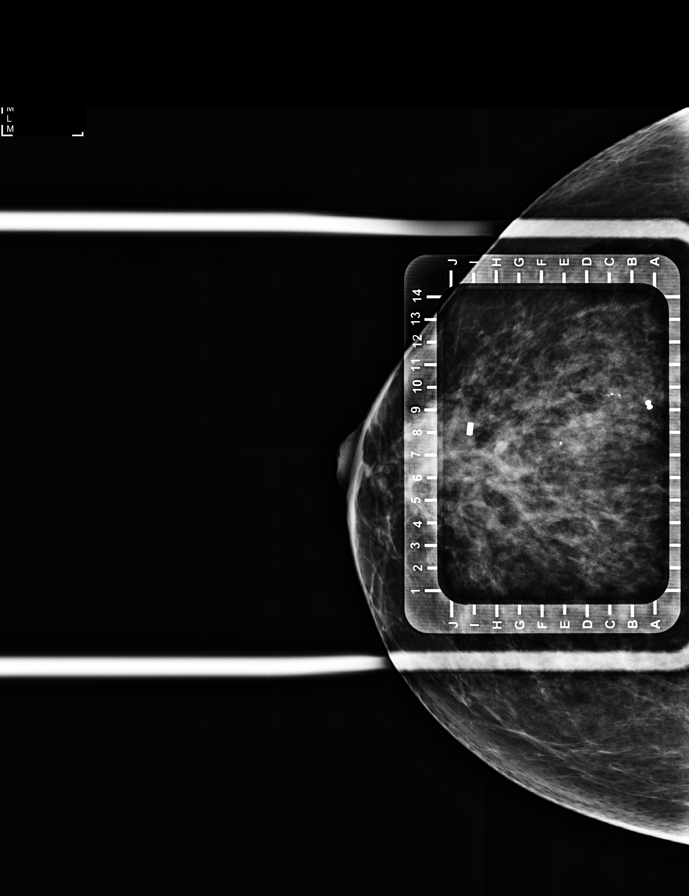

[R ML (1 of 4)]
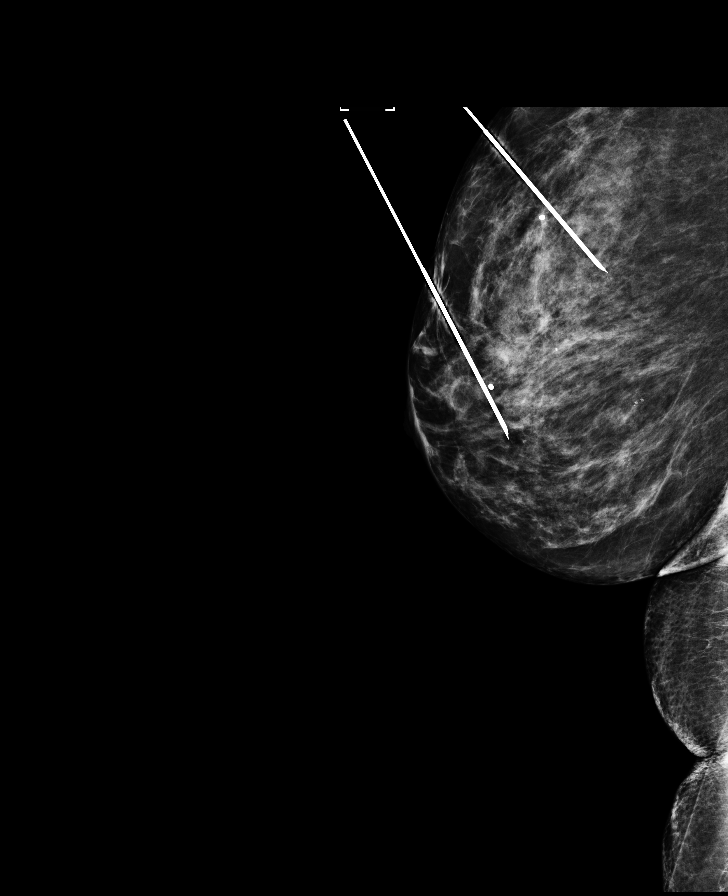

[R ML (2 of 4)]
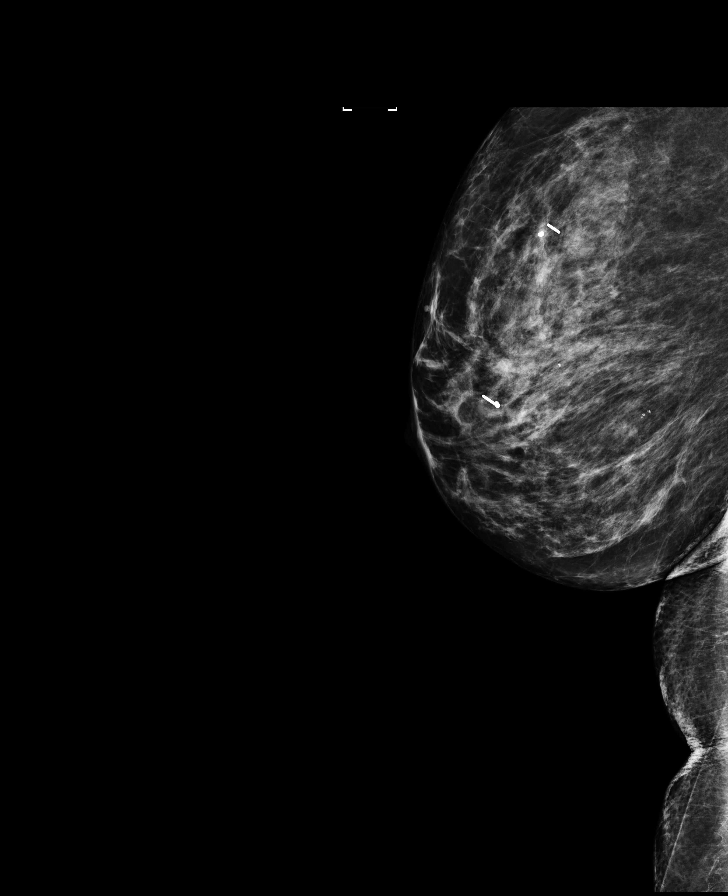

[R ML (3 of 4)]
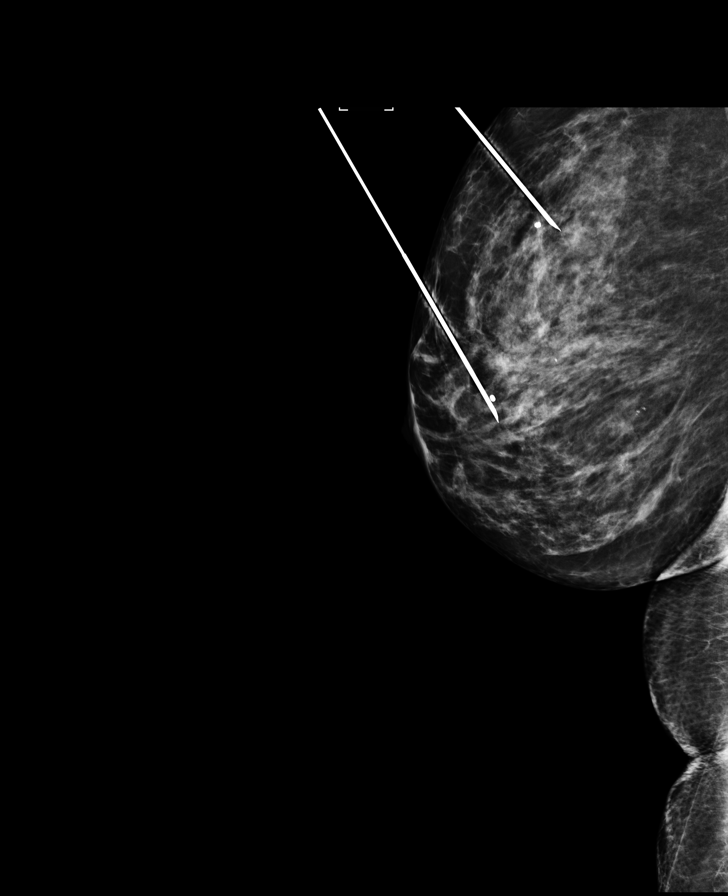

[R ML (4 of 4)]
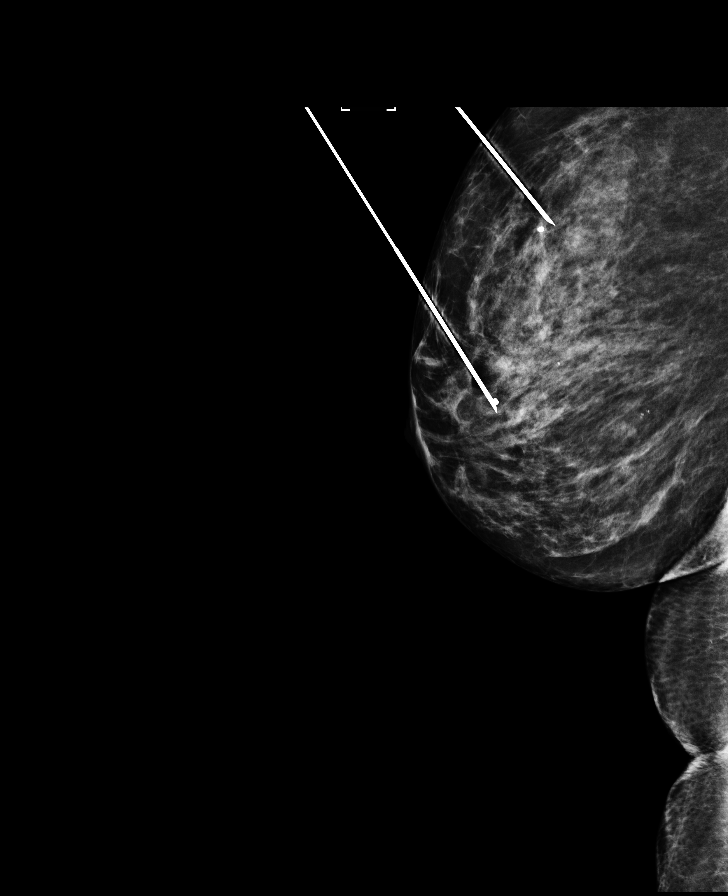

[8 of 14 positions shown; findings below may reference images not displayed]

FINDINGS: Patient presents for radioactive seed localization prior to left
breast lumpectomy 2 excisional right breast biopsies. I met with
the patient and we discussed the procedure of seed localization
including benefits and alternatives. We discussed the high
likelihood of a successful procedure. We discussed the risks of the
procedure including infection, bleeding, tissue injury and further
surgery. We discussed the low dose of radioactivity involved in the
procedure. Informed, written consent was given.

The usual time-out protocol was performed immediately prior to the
procedure.

Using mammographic guidance, sterile technique, 1% lidocaine and an
[Y4] radioactive seed, the ribbon shaped biopsy marking clip in the
medial left breast was localized using a medial approach. The
follow-up mammogram images confirm the seed in the expected location
and were marked for Dr. BANSAL CYPRUS.

Follow-up survey of the patient confirms presence of the radioactive
seed.

Order number of [Y4] seed:  [PHONE_NUMBER].

Total activity:  0.250 millicuries reference Date: [DATE]

----------------------------------------------

Using mammographic guidance, sterile technique, 1% lidocaine and an
[Y4] radioactive seed, the dumbbell-shaped biopsy marking clip in
the upper-outer posterior right breast was localized using a
superior approach. The follow-up mammogram images confirm the seed
in the expected location and were marked for Dr. BANSAL CYPRUS.

Follow-up survey of the patient confirms presence of the radioactive
seed.

Order number of [Y4] seed:  [PHONE_NUMBER].

Total activity:  0.250 millicuries reference Date: [DATE]

----------------------------------------------

Using mammographic guidance, sterile technique, 1% lidocaine and an
[Y4] radioactive seed, the cylinder shaped biopsy marking clip in
the upper outer anterior right breast was localized using a superior
approach. The follow-up mammogram images confirm the seed in the
expected location and were marked for Dr. BANSAL CYPRUS.

Follow-up survey of the patient confirms presence of the radioactive
seed.

Order number of [Y4] seed:  [PHONE_NUMBER].

Total activity:  0.250 millicuries reference Date: [DATE]

The patient tolerated the procedure well and was released from the
[REDACTED]. She was given instructions regarding seed removal.
IMPRESSION: Radioactive seed localization of 1 site in the left breast and 2
sites in the right breast. No apparent complications.

## 2020-02-14 NOTE — H&P (Signed)
Sherri Schultz Documented: 02/07/2020 2:02 PM Location: [place] Office Patient #: [id number] DOB: 10-Feb-1966 Undefined / Language: [name] / Race: White Female   History of Present Illness Stark Klein MD; 02/08/2020 4:33 PM) The patient is a 54 year old female who presents for a follow-up for Breast cancer. Pt is a lovely Sherri Schultz who was referred for consultation by Dr. Dimas Aguas for a diagnosis of breast cancer 11/2019. She had multiple screening detected left breast masses. There was a 1.8 cm mass at 8:30, a 1.5 cm mass, a 4 mm mass at 10 o'clock, and benign cyst higher up. Biopsies of the 8:30 and 10 o'clock masses were done. 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 She has not had breast issues before. She has positive family history in her her mother and her maternal aunt. She has a personal history of melanoma. Menarche was age 45. Menopause was at age 49. She has not used any HRT due to her family history. She is nulliparous.   She got genetic testing which was negative. She also got an MRI which showed 2 areas on the right which were biopsied. These biopsies showed complex sclerosing lesions.   MRI breast 12/29/2019 IMPRESSION: 1. 0.8 x 2.5 x 1.2 cm area of biopsy-proven DCIS within the LOWER INNER LEFT breast. 2. Multiple patchy areas of non masslike enhancement throughout the RIGHT breast, primarily anterior and central. Although these areas of non masslike enhancement most likely represent normal fibroglandular tissue or are benign, sampling of 1 of the more prominent areas is recommended as these areas have a similar appearance (but less suspicious enhancement) to the area of biopsy-proven LEFT breast DCIS. 3. No abnormal lymph nodes.  MR guided right breast biopsies 01/21/2020 pathology Diagnosis 1. Breast, right, needle core biopsy, anterior upper - COMPLEX SCLEROSING LESION. 2. Breast, right, needle  core biopsy, upper outer - COMPLEX SCLEROSING LESION.    pathology 12/14/2019 1. Breast, left, needle core biopsy, 8:30 o'clock - DUCTAL CARCINOMA IN SITU - SEE COMMENT 2. 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  Estrogen Receptor: 0%, NEGATIVE Progesterone Receptor: 0%, NEGATIVE   Allergies (April Staton, CMA; 02/07/2020 2:11 PM) No Known Allergies [12/20/2019]:  Medication History (April Staton, CMA; 02/07/2020 2:12 PM) Aspirin (81MG  Tablet DR, Oral) Active. Oscal 500/200 D-3 (500-200MG -UNIT Tablet, Oral) Active. Meclizine HCl (25MG  Tablet, Oral) Active. Zofran ODT (4MG  Tablet Disint, Oral) Active. Percocet (5-325MG  Tablet, Oral) Active. Promethazine HCl (25MG  Tablet, Oral) Active. traZODone HCl (50MG  Tablet, Oral) Active. valACYclovir HCl (1GM Tablet, Oral) Active. Effexor XR (150MG  Capsule ER 24HR, Oral) Active. Zinc Sulfate (220 (50 Zn)MG Capsule, Oral) Active. Medications Reconciled    Review of Systems Stark Klein MD; 02/08/2020 4:33 PM) All other systems negative  Vitals (April Staton CMA; 02/07/2020 2:12 PM) 02/07/2020 2:12 PM Weight: 165.25 lb Height: 64.5in Body Surface Area: 1.81 m Body Mass Index: 27.93 kg/m  Temp.: 98.85F(Tympanic)  Pulse: 67 (Regular)  P.OX: 98% (Room air) BP: 126/82 (Sitting, Left Arm, Standard)       Physical Exam Stark Klein MD; 02/08/2020 4:34 PM) General Mental Status-Alert. General Appearance-Consistent with stated age. Hydration-Well hydrated. Voice-Normal.  Head and Neck Head-normocephalic, atraumatic with no lesions or palpable masses.  Eye Sclera/Conjunctiva - Bilateral-No scleral  icterus.  Chest and Lung Exam Chest and lung exam reveals -quiet, even  and easy respiratory effort with no use of accessory muscles. Inspection Chest Wall - Normal. Back - normal.  Breast Note: no palpable masses. some bruising in the UOQ of the right breast.   Cardiovascular Cardiovascular examination reveals -normal pedal pulses bilaterally. Note: regular rate and rhythm  Abdomen Inspection-Inspection Normal. Palpation/Percussion Palpation and Percussion of the abdomen reveal - Soft, Non Tender, No Rebound tenderness, No Rigidity (guarding) and No hepatosplenomegaly.  Peripheral Vascular Upper Extremity Inspection - Bilateral - Normal - No Clubbing, No Cyanosis, No Edema, Pulses Intact. Lower Extremity Palpation - Edema - Bilateral - No edema - Bilateral.  Neurologic Neurologic evaluation reveals -alert and oriented x 3 with no impairment of recent or remote memory. Mental Status-Normal.  Musculoskeletal Global Assessment -Note: no gross deformities.  Normal Exam - Left-Upper Extremity Strength Normal and Lower Extremity Strength Normal. Normal Exam - Right-Upper Extremity Strength Normal and Lower Extremity Strength Normal.  Lymphatic Head & Neck  General Head & Neck Lymphatics: Bilateral - Description - Normal. Axillary  General Axillary Region: Bilateral - Description - Normal. Tenderness - Non Tender.    Assessment & Plan Stark Klein MD; 02/08/2020 4:36 PM) MALIGNANT NEOPLASM OF LOWER-INNER QUADRANT OF LEFT BREAST IN FEMALE, ESTROGEN RECEPTOR NEGATIVE (C50.312) Impression: The left side will still need a seed localized lumpectomy for the DCIS. This is unchanged. Will also need seed loc x 2 on right for papilloma and papillary lesion.  Discussed risks of surgery including bleeding, infection, damage to adjacent structures, dissatisfaction with breast appearance, recurrent cancer, chronic pain, and more. She wishes to proceed. ABNORMAL MRI, BREAST (R92.8) Current Plans Pt Education - [facility] breast cancer surgery:  discussed with patient and provided information.   Signed electronically by Stark Klein, MD (02/08/2020 4:37 PM)

## 2020-02-15 ENCOUNTER — Ambulatory Visit (HOSPITAL_BASED_OUTPATIENT_CLINIC_OR_DEPARTMENT_OTHER): Payer: BC Managed Care – PPO | Admitting: Certified Registered Nurse Anesthetist

## 2020-02-15 ENCOUNTER — Ambulatory Visit
Admission: RE | Admit: 2020-02-15 | Discharge: 2020-02-15 | Disposition: A | Discharge status: Home / Self Care | Payer: BC Managed Care – PPO | Source: Ambulatory Visit | Attending: General Surgery | Admitting: General Surgery

## 2020-02-15 ENCOUNTER — Encounter (HOSPITAL_BASED_OUTPATIENT_CLINIC_OR_DEPARTMENT_OTHER)
Admission: RE | Disposition: A | Discharge status: Home / Self Care | Payer: Self-pay | Source: Home / Self Care | Attending: General Surgery

## 2020-02-15 ENCOUNTER — Other Ambulatory Visit: Payer: Self-pay

## 2020-02-15 ENCOUNTER — Ambulatory Visit (HOSPITAL_BASED_OUTPATIENT_CLINIC_OR_DEPARTMENT_OTHER)
Admission: RE | Admit: 2020-02-15 | Discharge: 2020-02-15 | Disposition: A | Discharge status: Home / Self Care | Payer: BC Managed Care – PPO | Attending: General Surgery | Admitting: General Surgery

## 2020-02-15 ENCOUNTER — Encounter (HOSPITAL_BASED_OUTPATIENT_CLINIC_OR_DEPARTMENT_OTHER): Payer: Self-pay | Admitting: General Surgery

## 2020-02-15 DIAGNOSIS — N6011 Diffuse cystic mastopathy of right breast: Secondary | ICD-10-CM | POA: Insufficient documentation

## 2020-02-15 DIAGNOSIS — Z79899 Other long term (current) drug therapy: Secondary | ICD-10-CM | POA: Diagnosis not present

## 2020-02-15 DIAGNOSIS — D241 Benign neoplasm of right breast: Secondary | ICD-10-CM | POA: Insufficient documentation

## 2020-02-15 DIAGNOSIS — Z803 Family history of malignant neoplasm of breast: Secondary | ICD-10-CM | POA: Insufficient documentation

## 2020-02-15 DIAGNOSIS — Z171 Estrogen receptor negative status [ER-]: Secondary | ICD-10-CM | POA: Diagnosis not present

## 2020-02-15 DIAGNOSIS — C50312 Malignant neoplasm of lower-inner quadrant of left female breast: Secondary | ICD-10-CM | POA: Diagnosis not present

## 2020-02-15 DIAGNOSIS — R928 Other abnormal and inconclusive findings on diagnostic imaging of breast: Secondary | ICD-10-CM

## 2020-02-15 DIAGNOSIS — Z85820 Personal history of malignant melanoma of skin: Secondary | ICD-10-CM | POA: Diagnosis not present

## 2020-02-15 DIAGNOSIS — Z7982 Long term (current) use of aspirin: Secondary | ICD-10-CM | POA: Insufficient documentation

## 2020-02-15 HISTORY — PX: BREAST LUMPECTOMY WITH RADIOACTIVE SEED LOCALIZATION: SHX6424

## 2020-02-15 HISTORY — PX: RADIOACTIVE SEED GUIDED EXCISIONAL BREAST BIOPSY: SHX6490

## 2020-02-15 IMAGING — DX MM BREAST SURGICAL SPECIMEN
1 series · 2 of 2 positions shown · non-contrast
Comparison: Previous exam(s).

CLINICAL DATA: Evaluate 2 specimens

EXAM:
SPECIMEN RADIOGRAPH OF THE RIGHT BREAST

[Series 2: specimen digital x-ray, derived · right · 0.10mm/px · 2 of 2 slices shown]
[im 1/2]
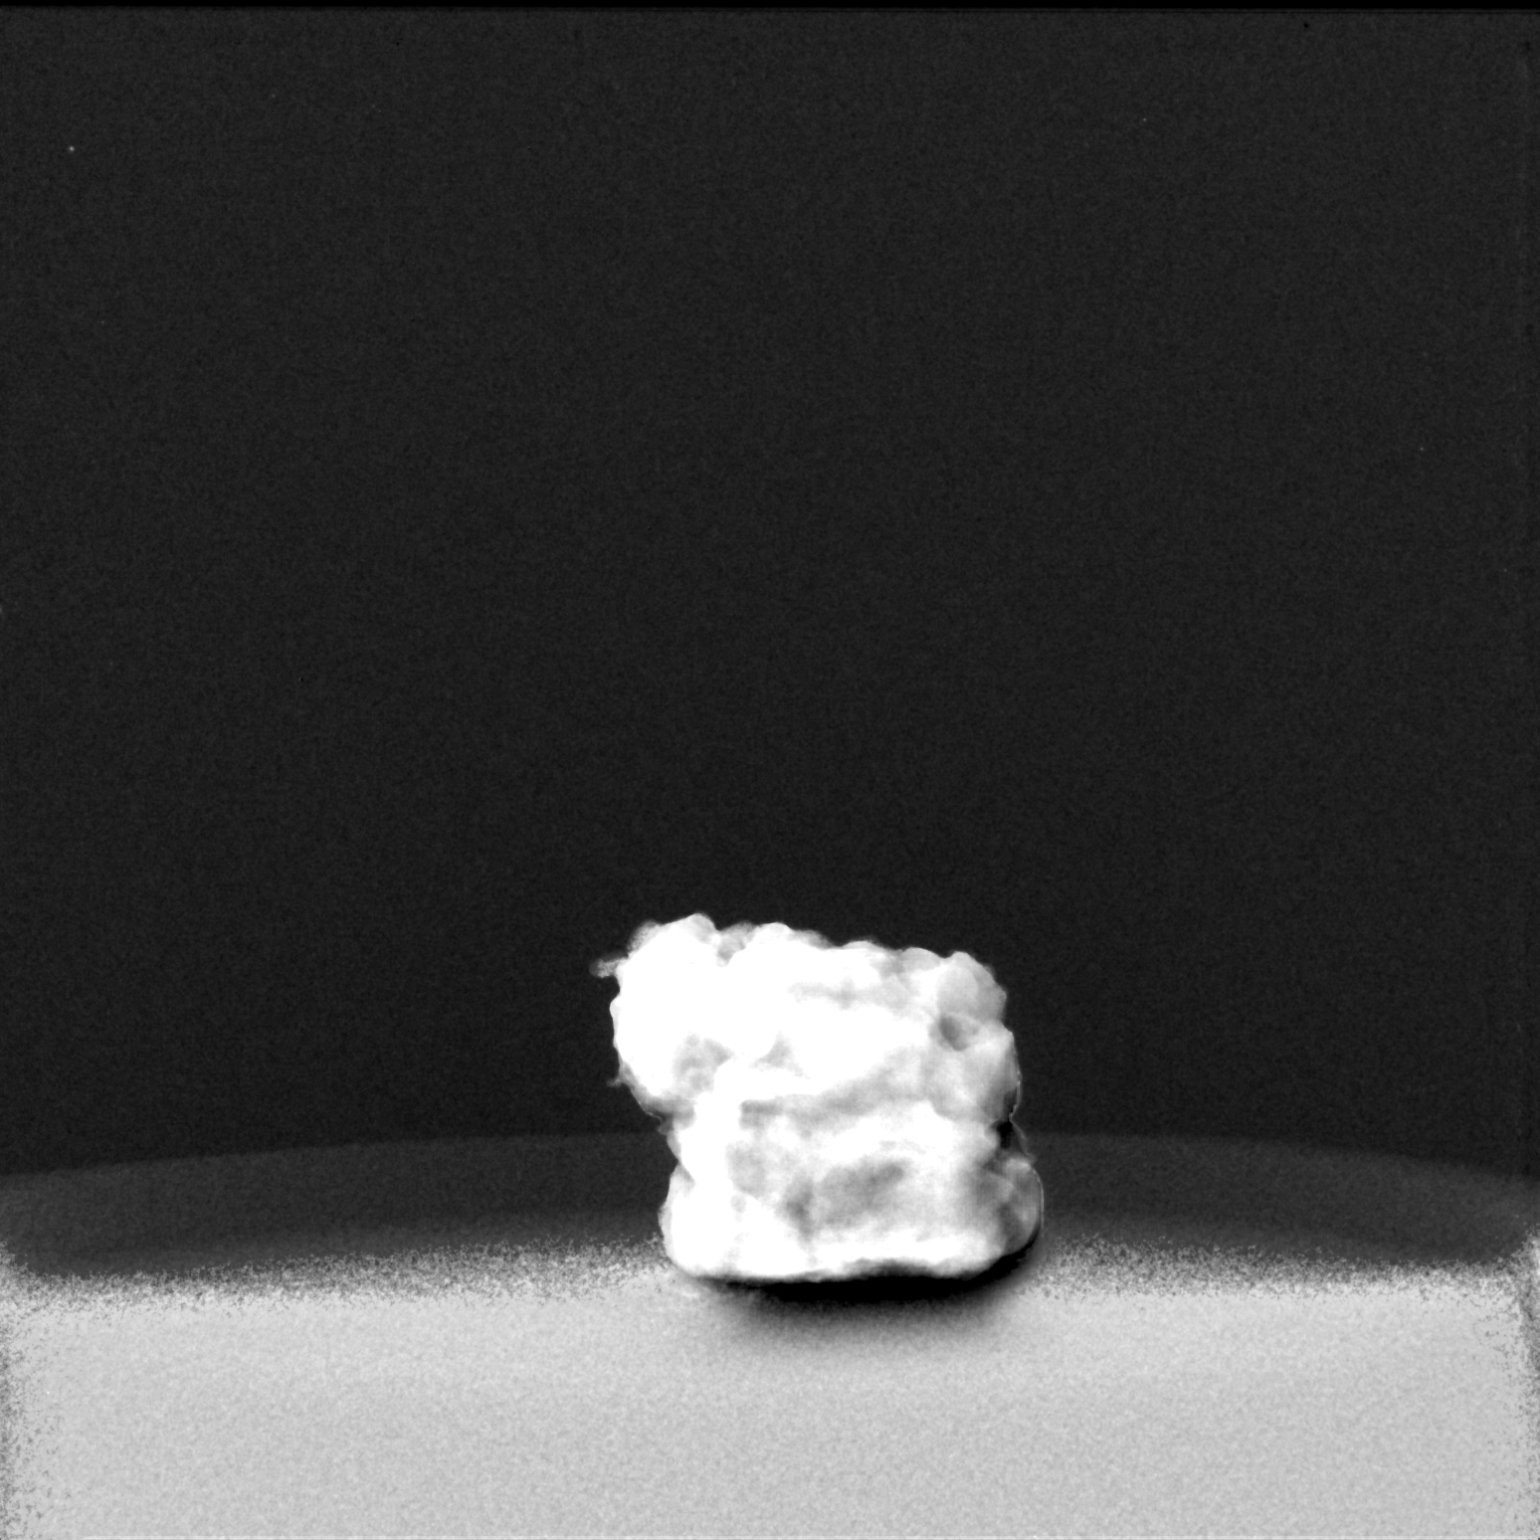
[im 2/2]
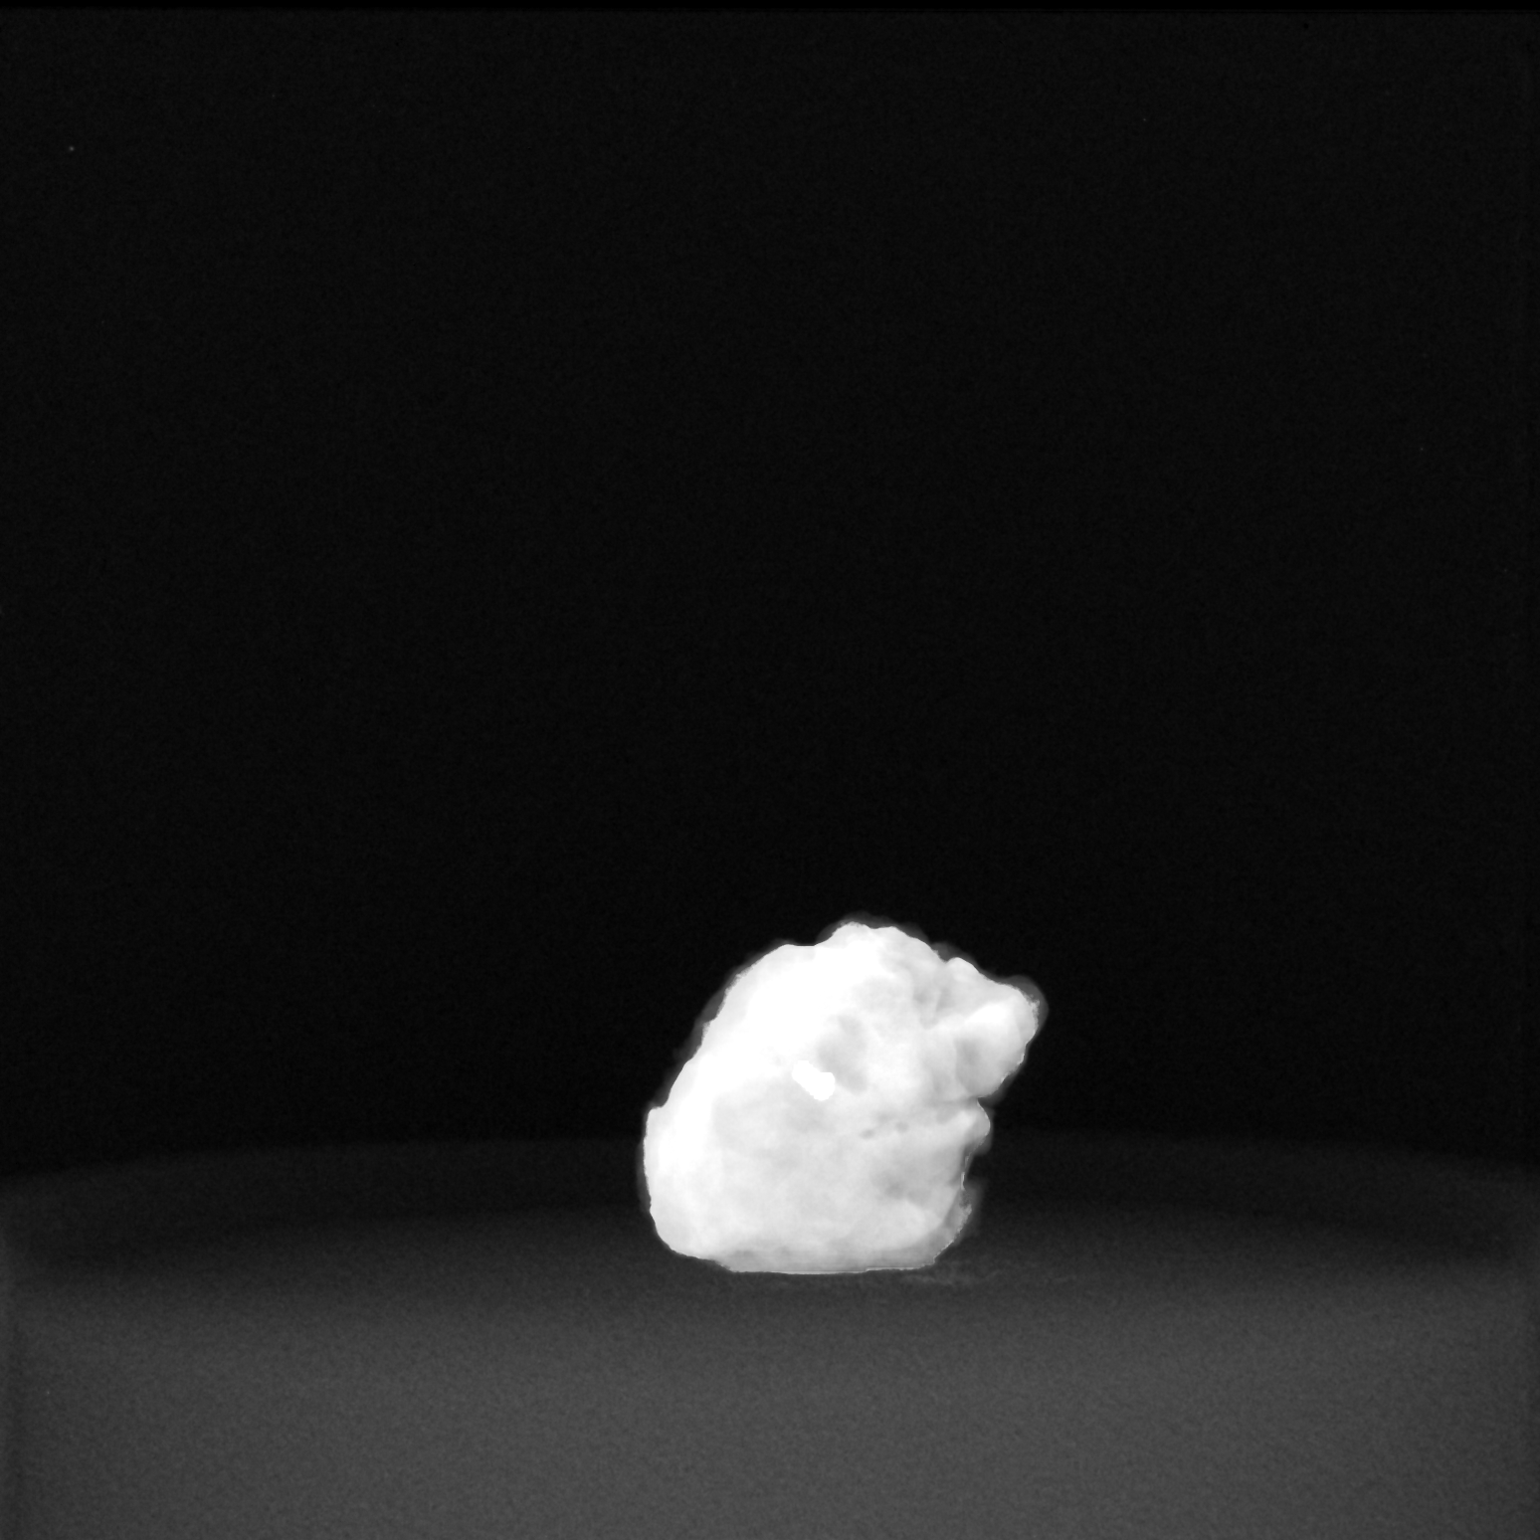

[2 of 2 positions shown; findings below may reference images not displayed]

FINDINGS: Status post excision of the right breast. The radioactive seeds and
biopsy marker clips are present, completely intact, and were marked
for pathology.
IMPRESSION: Specimen radiograph of the right breast.

## 2020-02-15 IMAGING — DX MM BREAST SURGICAL SPECIMEN
1 series · 2 of 2 positions shown · non-contrast
Comparison: Previous exam(s).

CLINICAL DATA: Evaluate left-sided specimen

EXAM:
SPECIMEN RADIOGRAPH OF THE LEFT BREAST

[Series 2: specimen digital x-ray, derived · left · 0.10mm/px · 2 of 2 slices shown]
[im 1/2]
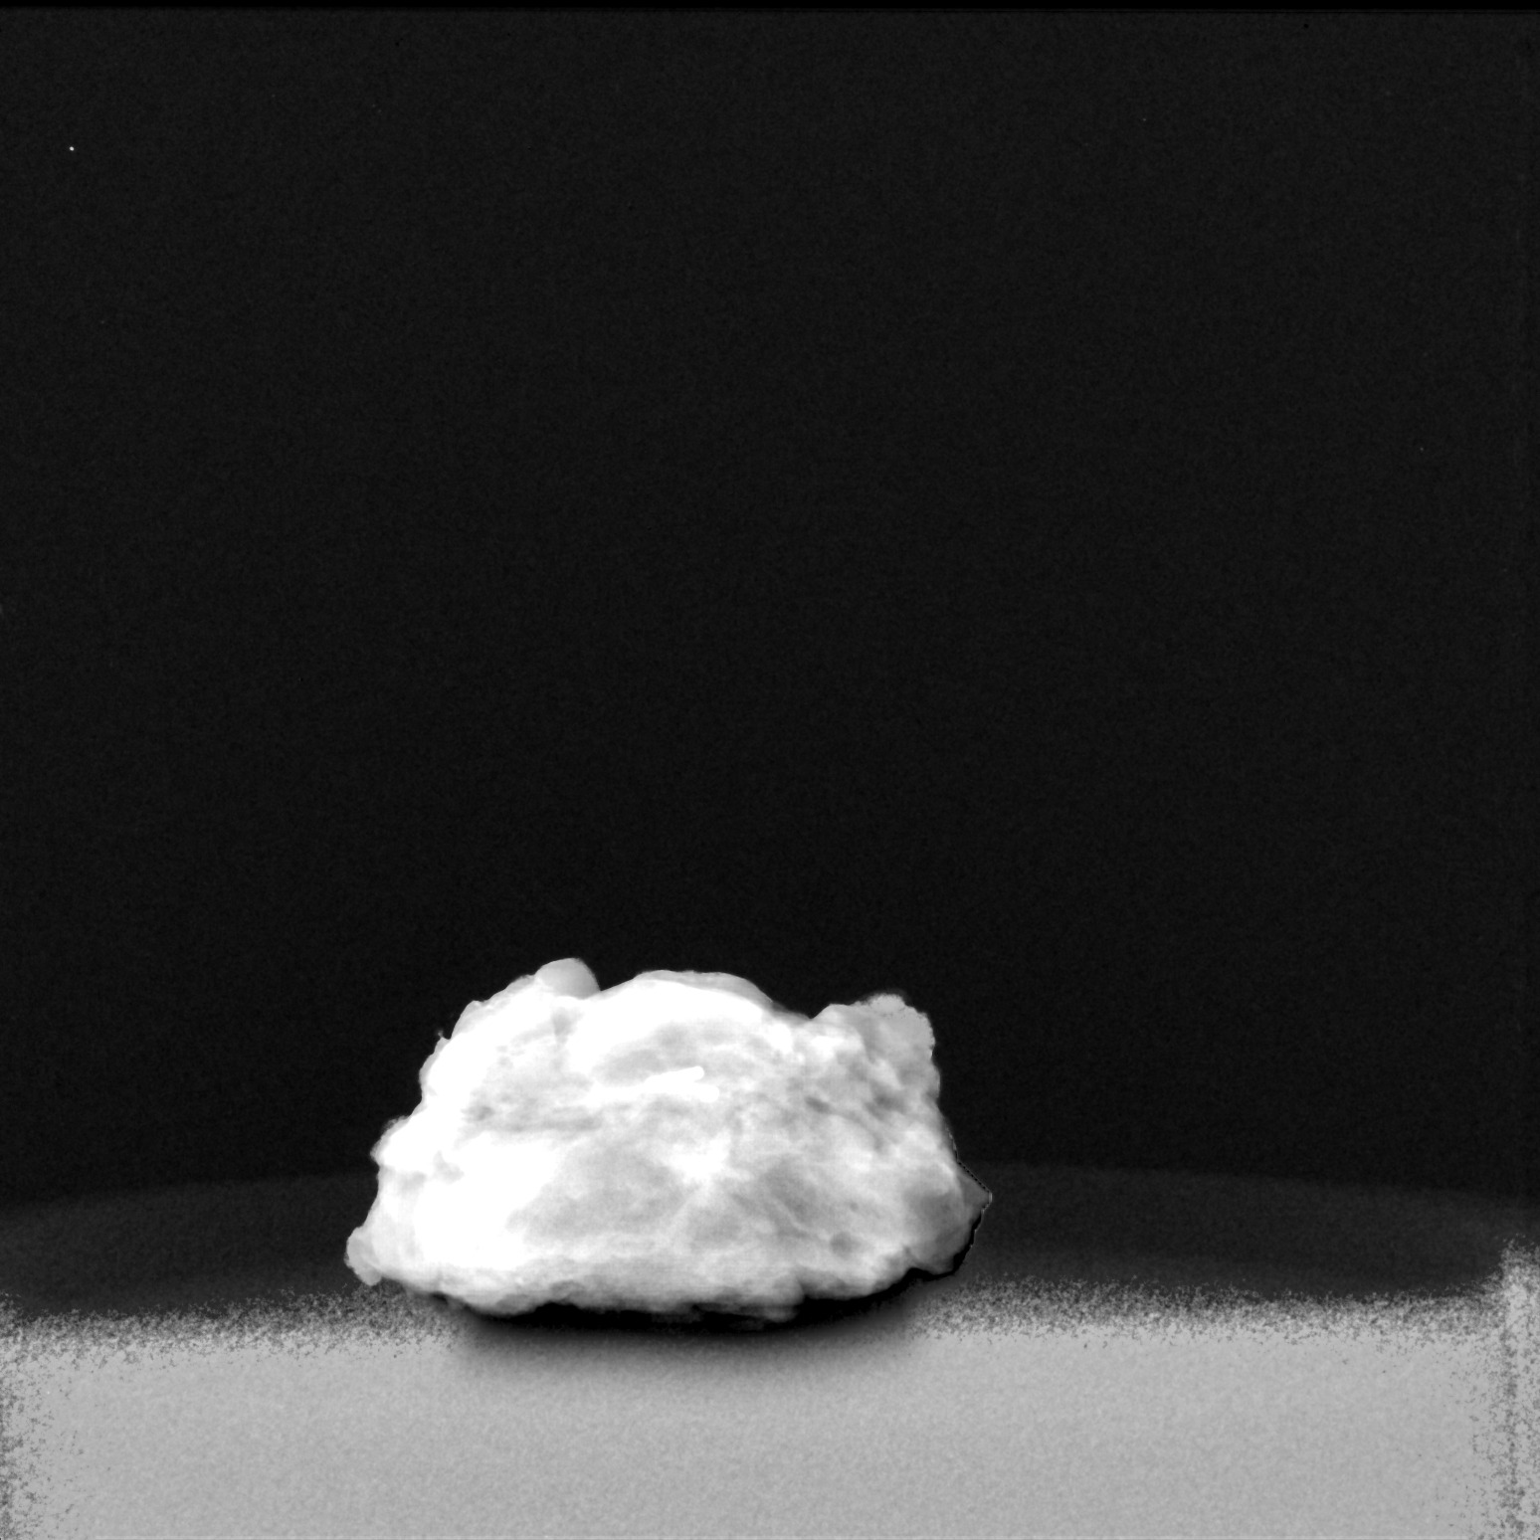
[im 2/2]
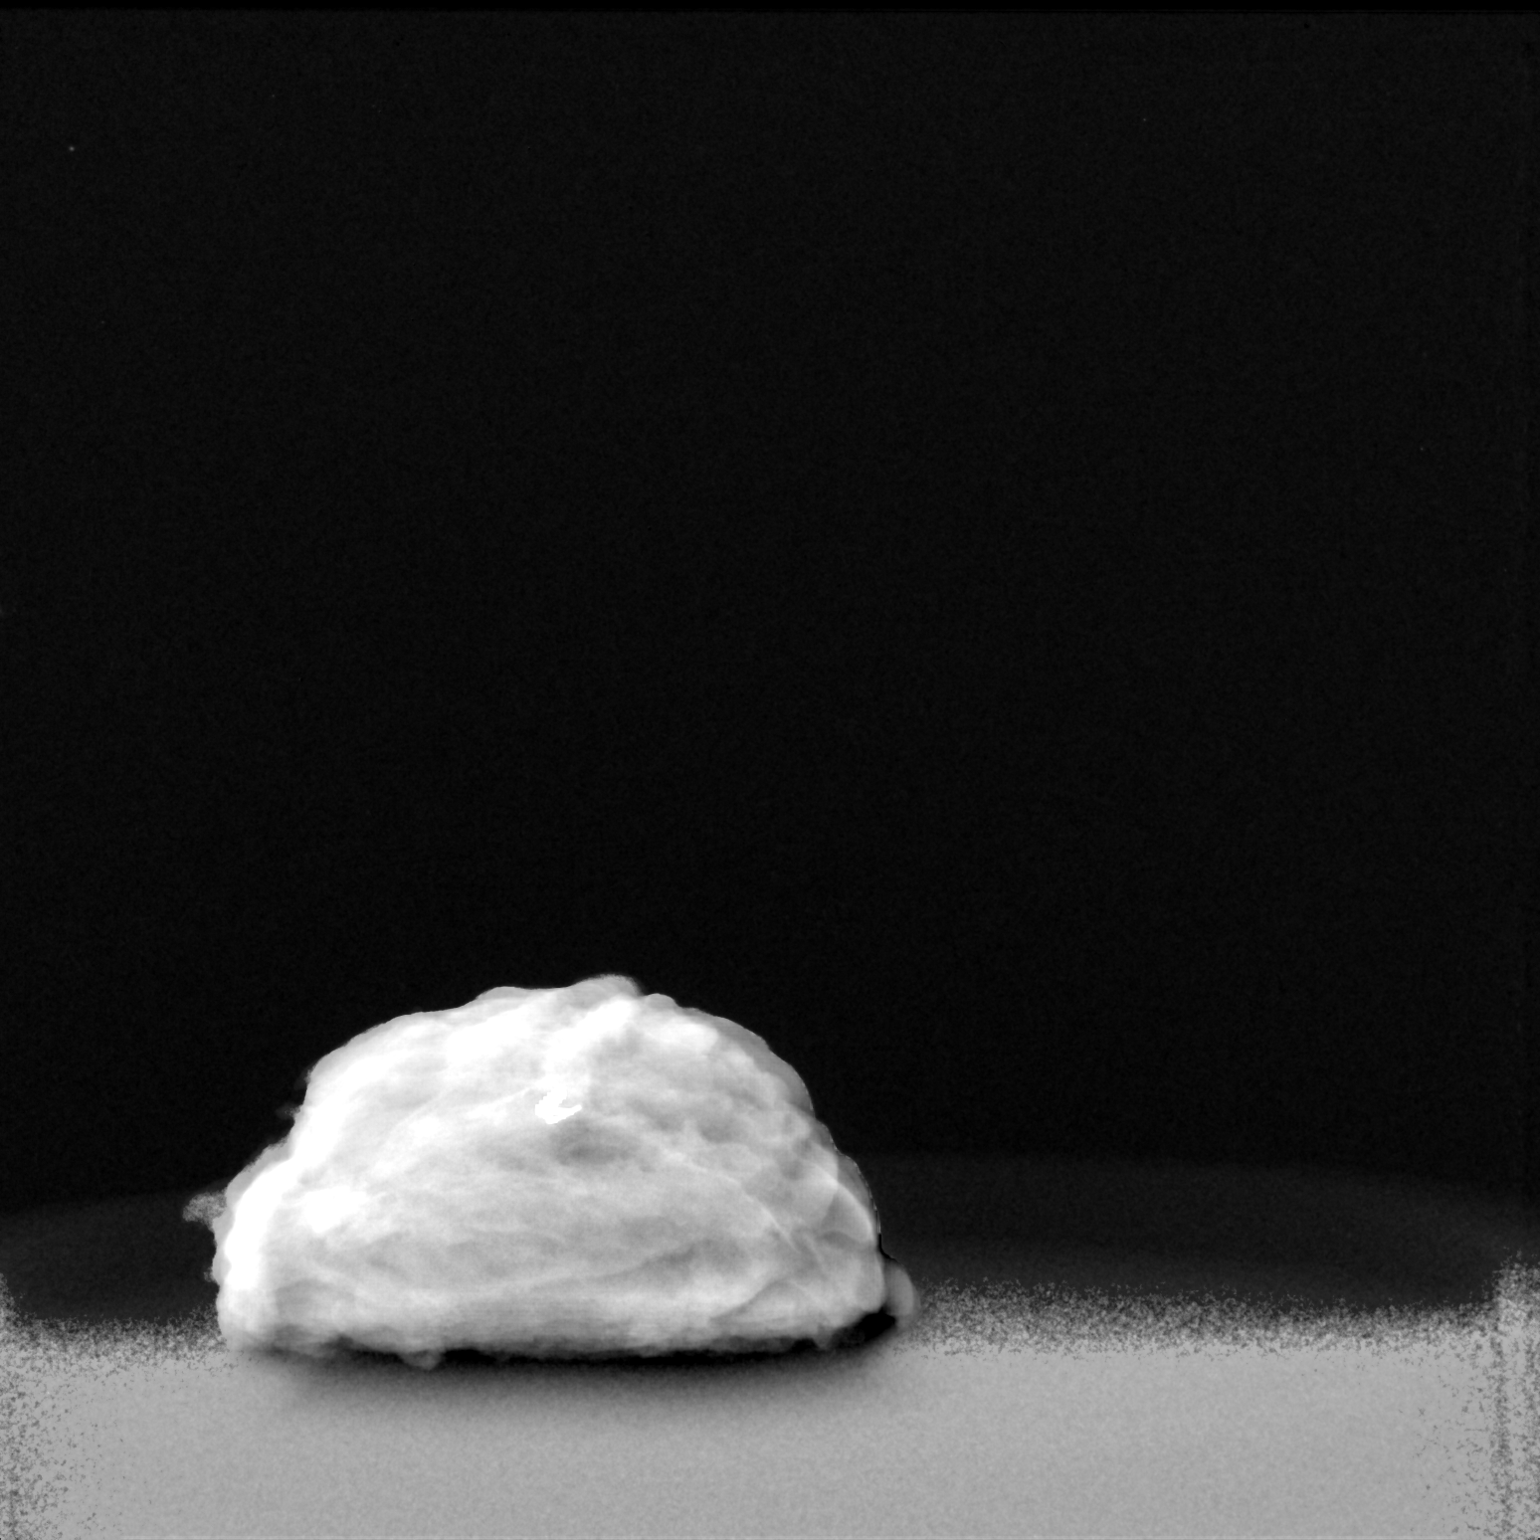

[2 of 2 positions shown; findings below may reference images not displayed]

FINDINGS: Status post excision of the left breast. The radioactive seed and
biopsy marker clip are present, completely intact, and were marked
for pathology.
IMPRESSION: Specimen radiograph of the left breast.

## 2020-02-15 IMAGING — DX MM BREAST SURGICAL SPECIMEN
1 series · 2 of 2 positions shown · non-contrast
Comparison: Previous exam(s).

CLINICAL DATA: Evaluate 2 specimens

EXAM:
SPECIMEN RADIOGRAPH OF THE RIGHT BREAST

[Series 2: specimen digital x-ray, derived · right · 0.10mm/px · 2 of 2 slices shown]
[im 1/2]
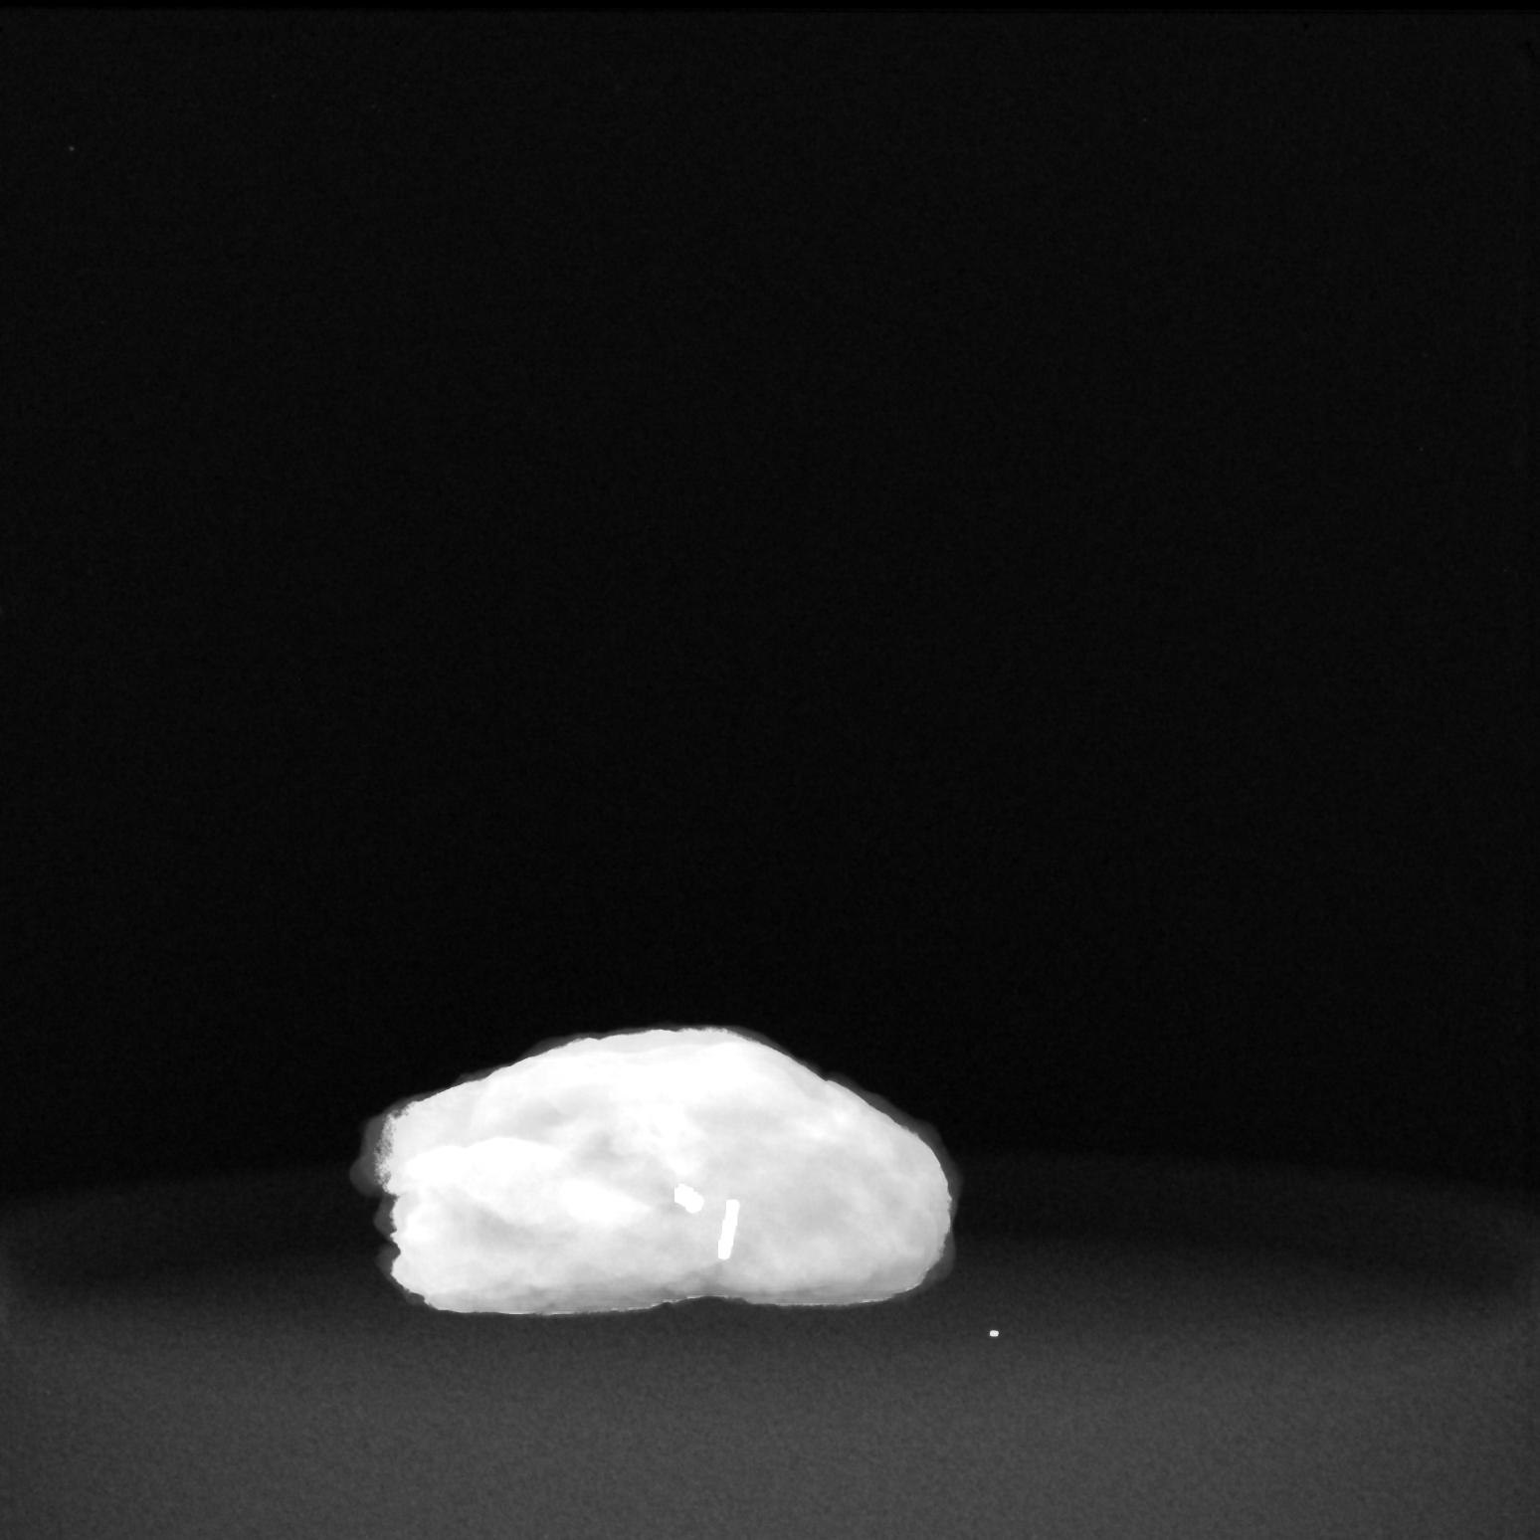
[im 2/2]
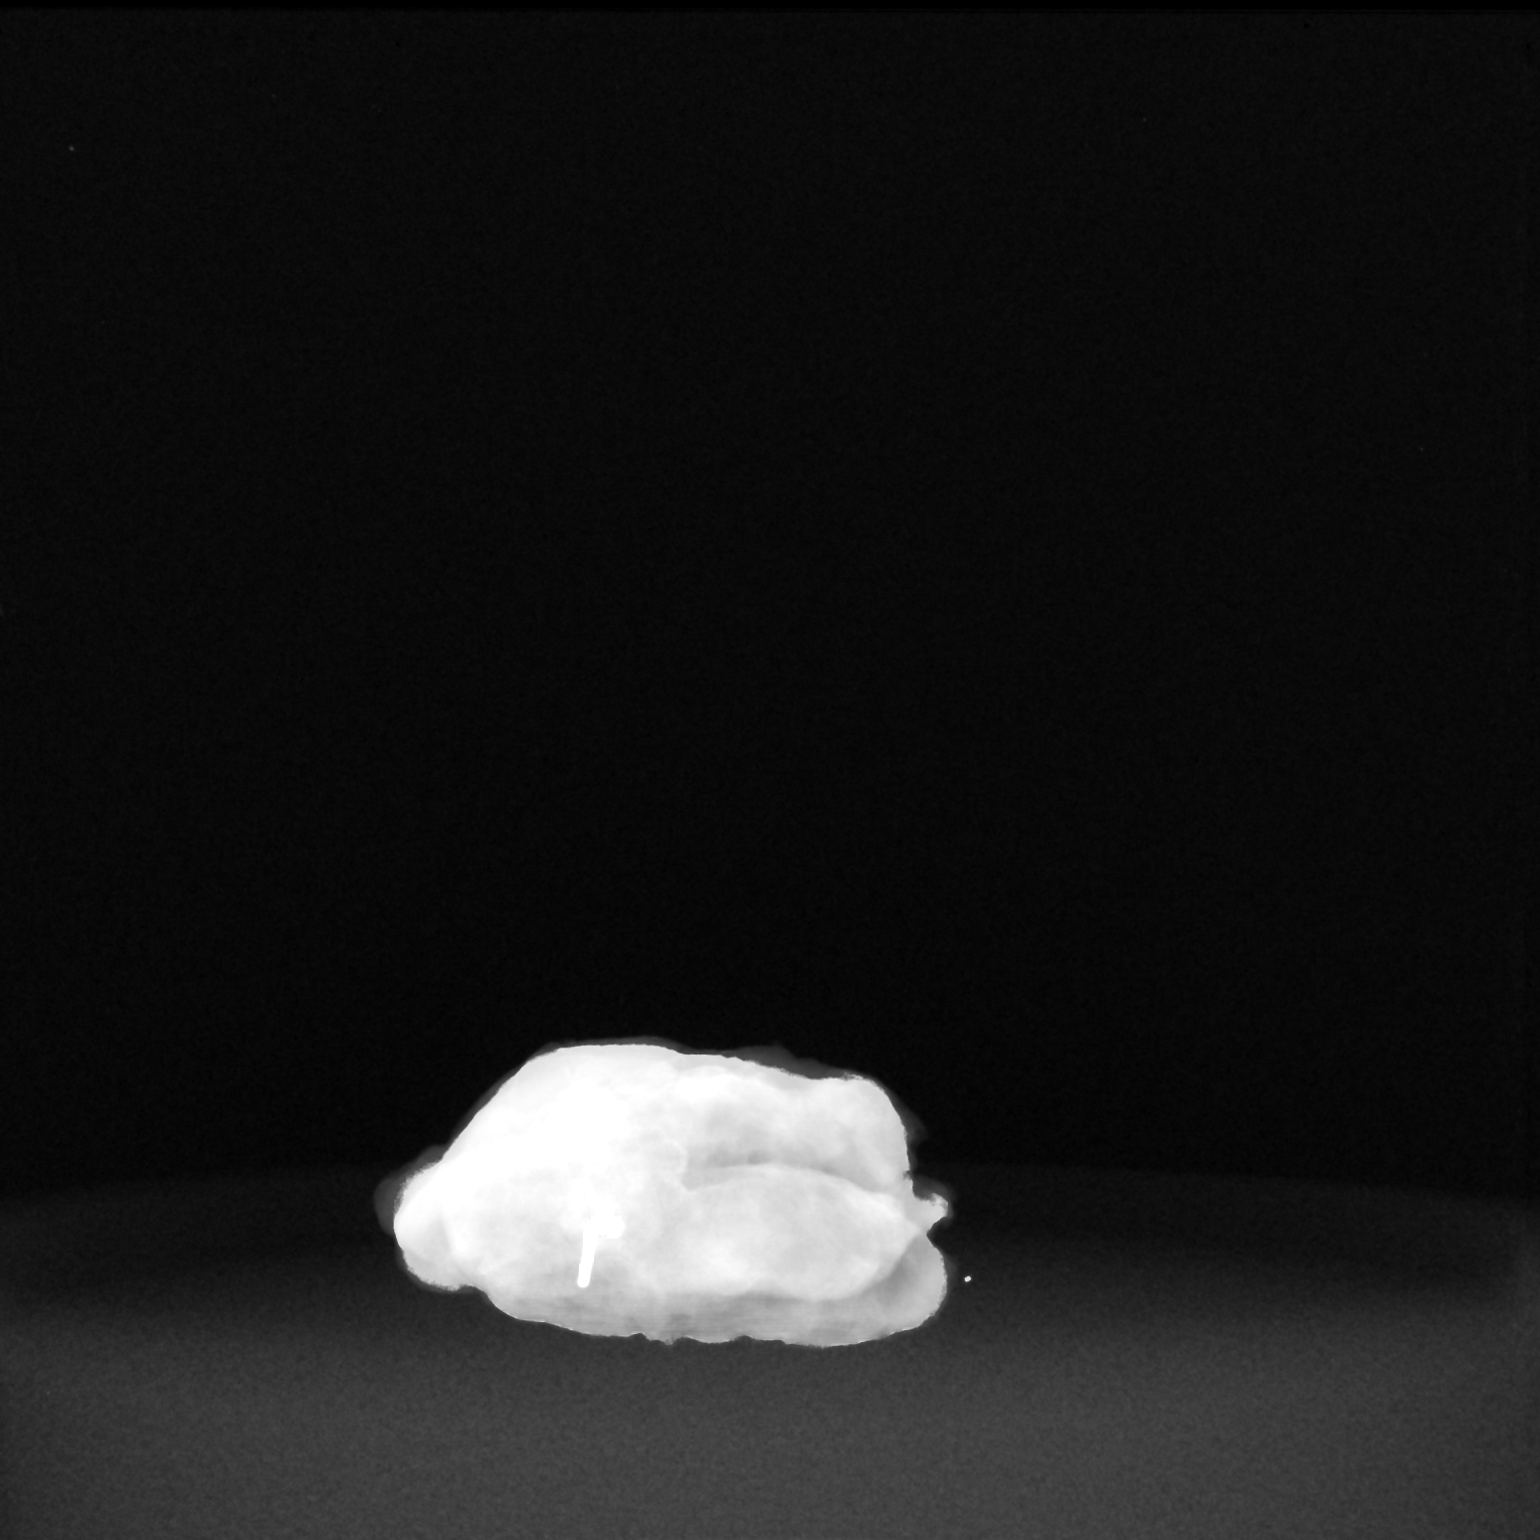

[2 of 2 positions shown; findings below may reference images not displayed]

FINDINGS: Status post excision of the right breast. The radioactive seeds and
biopsy marker clips are present, completely intact, and were marked
for pathology.
IMPRESSION: Specimen radiograph of the right breast.

## 2020-02-15 SURGERY — BREAST LUMPECTOMY WITH RADIOACTIVE SEED LOCALIZATION
Anesthesia: General | Site: Breast | Laterality: Right

## 2020-02-15 MED ORDER — ACETAMINOPHEN 500 MG PO TABS: 1000.0000 mg | ORAL_TABLET | Freq: Three times a day (TID) | ORAL | 0 refills | Status: AC

## 2020-02-15 MED ORDER — ACETAMINOPHEN 500 MG PO TABS
ORAL_TABLET | ORAL | Status: AC
  Filled 2020-02-15: qty 1

## 2020-02-15 MED ORDER — ONDANSETRON HCL 4 MG/2ML IJ SOLN
INTRAMUSCULAR | Status: AC
  Filled 2020-02-15: qty 2

## 2020-02-15 MED ORDER — LACTATED RINGERS IV SOLN: INTRAVENOUS | Status: DC

## 2020-02-15 MED ORDER — DEXAMETHASONE SODIUM PHOSPHATE 10 MG/ML IJ SOLN
INTRAMUSCULAR | Status: DC | PRN
  Administered 2020-02-15: 10 mg via INTRAVENOUS

## 2020-02-15 MED ORDER — OXYCODONE HCL 5 MG PO TABS
5.0000 mg | ORAL_TABLET | Freq: Once | ORAL | Status: AC | PRN
  Administered 2020-02-15: 11:00:00 5 mg via ORAL

## 2020-02-15 MED ORDER — DEXAMETHASONE SODIUM PHOSPHATE 10 MG/ML IJ SOLN
INTRAMUSCULAR | Status: AC
  Filled 2020-02-15: qty 1

## 2020-02-15 MED ORDER — FENTANYL CITRATE (PF) 100 MCG/2ML IJ SOLN
25.0000 ug | INTRAMUSCULAR | Status: DC | PRN
  Administered 2020-02-15: 25 ug via INTRAVENOUS
  Administered 2020-02-15: 50 ug via INTRAVENOUS

## 2020-02-15 MED ORDER — BUPIVACAINE HCL (PF) 0.25 % IJ SOLN
INTRAMUSCULAR | Status: DC | PRN
  Administered 2020-02-15: 30 mL

## 2020-02-15 MED ORDER — OXYCODONE HCL 5 MG PO TABS: 5.0000 mg | ORAL_TABLET | Freq: Four times a day (QID) | ORAL | 0 refills | Status: DC | PRN

## 2020-02-15 MED ORDER — MIDAZOLAM HCL 2 MG/2ML IJ SOLN
INTRAMUSCULAR | Status: AC
  Filled 2020-02-15: qty 2

## 2020-02-15 MED ORDER — CHLORHEXIDINE GLUCONATE CLOTH 2 % EX PADS: 6.0000 | MEDICATED_PAD | Freq: Once | CUTANEOUS | Status: DC

## 2020-02-15 MED ORDER — FENTANYL CITRATE (PF) 100 MCG/2ML IJ SOLN
50.0000 ug | INTRAMUSCULAR | Status: AC | PRN
  Administered 2020-02-15 (×3): 50 ug via INTRAVENOUS

## 2020-02-15 MED ORDER — OXYCODONE HCL 5 MG PO TABS
ORAL_TABLET | ORAL | Status: AC
  Filled 2020-02-15: qty 1

## 2020-02-15 MED ORDER — LIDOCAINE-EPINEPHRINE 0.5 %-1:200000 IJ SOLN
INTRAMUSCULAR | Status: DC | PRN
  Administered 2020-02-15: 30 mL

## 2020-02-15 MED ORDER — LIDOCAINE 2% (20 MG/ML) 5 ML SYRINGE
INTRAMUSCULAR | Status: AC
  Filled 2020-02-15: qty 5

## 2020-02-15 MED ORDER — ACETAMINOPHEN 500 MG PO TABS
1000.0000 mg | ORAL_TABLET | ORAL | Status: AC
  Administered 2020-02-15: 1000 mg via ORAL

## 2020-02-15 MED ORDER — CEFAZOLIN SODIUM-DEXTROSE 2-4 GM/100ML-% IV SOLN
2.0000 g | INTRAVENOUS | Status: AC
  Administered 2020-02-15: 2 g via INTRAVENOUS

## 2020-02-15 MED ORDER — MIDAZOLAM HCL 2 MG/2ML IJ SOLN
1.0000 mg | INTRAMUSCULAR | Status: DC | PRN
  Administered 2020-02-15: 08:00:00 2 mg via INTRAVENOUS

## 2020-02-15 MED ORDER — FENTANYL CITRATE (PF) 100 MCG/2ML IJ SOLN
INTRAMUSCULAR | Status: AC
  Filled 2020-02-15: qty 2

## 2020-02-15 MED ORDER — LIDOCAINE 2% (20 MG/ML) 5 ML SYRINGE
INTRAMUSCULAR | Status: DC | PRN
  Administered 2020-02-15: 60 mg via INTRAVENOUS

## 2020-02-15 MED ORDER — ONDANSETRON HCL 4 MG/2ML IJ SOLN
INTRAMUSCULAR | Status: DC | PRN
  Administered 2020-02-15: 4 mg via INTRAVENOUS

## 2020-02-15 MED ORDER — CEFAZOLIN SODIUM-DEXTROSE 2-4 GM/100ML-% IV SOLN
INTRAVENOUS | Status: AC
  Filled 2020-02-15: qty 100

## 2020-02-15 MED ORDER — PROPOFOL 10 MG/ML IV BOLUS
INTRAVENOUS | Status: DC | PRN
  Administered 2020-02-15: 150 mg via INTRAVENOUS

## 2020-02-15 SURGICAL SUPPLY — 58 items
ADH SKN CLS APL DERMABOND .7 (GAUZE/BANDAGES/DRESSINGS) ×2
APL PRP STRL LF DISP 70% ISPRP (MISCELLANEOUS) ×2
BINDER BREAST XLRG (GAUZE/BANDAGES/DRESSINGS) ×2 IMPLANT
BLADE SURG 10 STRL SS (BLADE) ×4 IMPLANT
BLADE SURG 15 STRL LF DISP TIS (BLADE) IMPLANT
BLADE SURG 15 STRL SS (BLADE) ×4
CANISTER SUC SOCK COL 7IN (MISCELLANEOUS) ×2 IMPLANT
CANISTER SUCT 1200ML W/VALVE (MISCELLANEOUS) ×4 IMPLANT
CHLORAPREP W/TINT 26 (MISCELLANEOUS) ×4 IMPLANT
CLIP VESOCCLUDE LG 6/CT (CLIP) ×4 IMPLANT
CLOSURE WOUND 1/2 X4 (GAUZE/BANDAGES/DRESSINGS) ×1
COVER BACK TABLE 60X90IN (DRAPES) ×4 IMPLANT
COVER MAYO STAND STRL (DRAPES) ×4 IMPLANT
COVER PROBE W GEL 5X96 (DRAPES) ×4 IMPLANT
DECANTER SPIKE VIAL GLASS SM (MISCELLANEOUS) IMPLANT
DERMABOND ADVANCED (GAUZE/BANDAGES/DRESSINGS) ×2
DERMABOND ADVANCED .7 DNX12 (GAUZE/BANDAGES/DRESSINGS) ×2 IMPLANT
DRAPE LAPAROSCOPIC ABDOMINAL (DRAPES) ×4 IMPLANT
DRAPE UTILITY XL STRL (DRAPES) ×4 IMPLANT
ELECT COATED BLADE 2.86 ST (ELECTRODE) ×4 IMPLANT
ELECT REM PT RETURN 9FT ADLT (ELECTROSURGICAL) ×4
ELECTRODE REM PT RTRN 9FT ADLT (ELECTROSURGICAL) ×2 IMPLANT
GAUZE SPONGE 4X4 12PLY STRL LF (GAUZE/BANDAGES/DRESSINGS) ×4 IMPLANT
GLOVE BIO SURGEON STRL SZ 6 (GLOVE) ×4 IMPLANT
GLOVE BIO SURGEON STRL SZ 6.5 (GLOVE) ×1 IMPLANT
GLOVE BIO SURGEONS STRL SZ 6.5 (GLOVE) ×1
GLOVE BIOGEL PI IND STRL 6.5 (GLOVE) ×2 IMPLANT
GLOVE BIOGEL PI INDICATOR 6.5 (GLOVE) ×4
GLOVE SURG SS PI 6.5 STRL IVOR (GLOVE) ×2 IMPLANT
GOWN STRL REUS W/ TWL LRG LVL3 (GOWN DISPOSABLE) ×2 IMPLANT
GOWN STRL REUS W/TWL 2XL LVL3 (GOWN DISPOSABLE) ×4 IMPLANT
GOWN STRL REUS W/TWL LRG LVL3 (GOWN DISPOSABLE) ×8
KIT MARKER MARGIN INK (KITS) ×4 IMPLANT
LIGHT WAVEGUIDE WIDE FLAT (MISCELLANEOUS) IMPLANT
NDL HYPO 25X1 1.5 SAFETY (NEEDLE) ×2 IMPLANT
NEEDLE HYPO 25X1 1.5 SAFETY (NEEDLE) ×4 IMPLANT
NS IRRIG 1000ML POUR BTL (IV SOLUTION) ×4 IMPLANT
PACK BASIN DAY SURGERY FS (CUSTOM PROCEDURE TRAY) ×4 IMPLANT
PENCIL SMOKE EVACUATOR (MISCELLANEOUS) ×4 IMPLANT
SLEEVE SCD COMPRESS KNEE MED (MISCELLANEOUS) ×4 IMPLANT
SPONGE LAP 18X18 RF (DISPOSABLE) ×6 IMPLANT
STRIP CLOSURE SKIN 1/2X4 (GAUZE/BANDAGES/DRESSINGS) ×3 IMPLANT
SUT MNCRL AB 4-0 PS2 18 (SUTURE) ×6 IMPLANT
SUT MON AB 4-0 PC3 18 (SUTURE) ×2 IMPLANT
SUT SILK 2 0 SH (SUTURE) IMPLANT
SUT VIC AB 2-0 SH 18 (SUTURE) IMPLANT
SUT VIC AB 2-0 SH 27 (SUTURE) ×4
SUT VIC AB 2-0 SH 27XBRD (SUTURE) ×2 IMPLANT
SUT VIC AB 3-0 SH 27 (SUTURE)
SUT VIC AB 3-0 SH 27X BRD (SUTURE) ×2 IMPLANT
SUT VICRYL 3-0 CR8 SH (SUTURE) ×2 IMPLANT
SYR BULB 3OZ (MISCELLANEOUS) ×4 IMPLANT
SYR CONTROL 10ML LL (SYRINGE) ×4 IMPLANT
TOWEL GREEN STERILE FF (TOWEL DISPOSABLE) ×4 IMPLANT
TRAY FAXITRON CT DISP (TRAY / TRAY PROCEDURE) ×8 IMPLANT
TUBE CONNECTING 20'X1/4 (TUBING) ×1
TUBE CONNECTING 20X1/4 (TUBING) ×3 IMPLANT
YANKAUER SUCT BULB TIP NO VENT (SUCTIONS) ×4 IMPLANT

## 2020-02-15 NOTE — Anesthesia Procedure Notes (Signed)
Procedure Name: LMA Insertion Date/Time: 02/15/2020 8:18 AM Performed by: [place] Evanny Ellerbe C, CRNA Pre-anesthesia Checklist: Patient identified, Emergency Drugs available, Suction available and Patient being monitored Patient Re-evaluated:Patient Re-evaluated prior to induction Oxygen Delivery Method: Circle system utilized Preoxygenation: Pre-oxygenation with 100% oxygen Induction Type: IV induction Ventilation: Mask ventilation without difficulty LMA: LMA inserted LMA Size: 4.0 Number of attempts: 1 Airway Equipment and Method: Bite block Placement Confirmation: positive ETCO2 Tube secured with: Tape Dental Injury: Teeth and Oropharynx as per pre-operative assessment

## 2020-02-15 NOTE — Discharge Instructions (Addendum)
[place] Office Phone Number [phone]  BREAST BIOPSY/ PARTIAL MASTECTOMY: POST OP INSTRUCTIONS  Always review your discharge instruction sheet given to you by the facility where your surgery was performed.  IF YOU HAVE DISABILITY OR FAMILY LEAVE FORMS, YOU MUST BRING THEM TO THE OFFICE FOR PROCESSING.  DO NOT GIVE THEM TO YOUR DOCTOR.  1. A prescription for pain medication may be given to you upon discharge.  Take your pain medication as prescribed, if needed.  If narcotic pain medicine is not needed, then you may take acetaminophen (Tylenol) or ibuprofen (Advil) as needed. 2. Take your usually prescribed medications unless otherwise directed 3. If you need a refill on your pain medication, please contact your pharmacy.  They will contact our office to request authorization.  Prescriptions will not be filled after 5pm or on week-ends. 4. You should eat very light the first 24 hours after surgery, such as soup, crackers, pudding, etc.  Resume your normal diet the day after surgery. 5. Most patients will experience some swelling and bruising in the breast.  Ice packs and a good support bra will help.  Swelling and bruising can take several days to resolve.  6. It is common to experience some constipation if taking pain medication after surgery.  Increasing fluid intake and taking a stool softener will usually help or prevent this problem from occurring.  A mild laxative (Milk of Magnesia or Miralax) should be taken according to package directions if there are no bowel movements after 48 hours. 7. Unless discharge instructions indicate otherwise, you may remove your bandages 48 hours after surgery, and you may shower at that time.  You may have steri-strips (small skin tapes) in place directly over the incision.  These strips should be left on the skin for 7-10 days.   Any sutures or staples will be removed at the office during your follow-up visit. 8. ACTIVITIES:  You may resume  regular daily activities (gradually increasing) beginning the next day.  Wearing a good support bra or sports bra (or the breast binder) minimizes pain and swelling.  You may have sexual intercourse when it is comfortable. a. You may drive when you no longer are taking prescription pain medication, you can comfortably wear a seatbelt, and you can safely maneuver your car and apply brakes. b. RETURN TO WORK:  __________1 week_______________ 9. You should see your doctor in the office for a follow-up appointment approximately two weeks after your surgery.  Your doctor's nurse will typically make your follow-up appointment when she calls you with your pathology report.  Expect your pathology report 2-3 business days after your surgery.  You may call to check if you do not hear from [place] after three days.   WHEN TO CALL YOUR DOCTOR: 1. Fever over 101.0 2. Nausea and/or vomiting. 3. Extreme swelling or bruising. 4. Continued bleeding from incision. 5. Increased pain, redness, or drainage from the incision.  The clinic staff is available to answer your questions during regular business hours.  Please don't hesitate to call and ask to speak to one of the nurses for clinical concerns.  If you have a medical emergency, go to the nearest emergency room or call 911.  A surgeon from [facility] is always on call at the hospital.  For further questions, please visit [email or web address]    Post Anesthesia Home Care Instructions  Activity: Get plenty of rest for the remainder of the day. A responsible individual must stay with you for 24  hours following the procedure.  For the next 24 hours, DO NOT: -Drive a car -Paediatric nurse -Drink alcoholic beverages -Take any medication unless instructed by your physician -Make any legal decisions or sign important papers.  Meals: Start with liquid foods such as gelatin or soup. Progress to regular foods as tolerated. Avoid greasy, spicy,  heavy foods. If nausea and/or vomiting occur, drink only clear liquids until the nausea and/or vomiting subsides. Call your physician if vomiting continues.  Special Instructions/Symptoms: Your throat may feel dry or sore from the anesthesia or the breathing tube placed in your throat during surgery. If this causes discomfort, gargle with warm salt water. The discomfort should disappear within 24 hours.  May have Tylenol at 1:15pm

## 2020-02-15 NOTE — Interval H&P Note (Signed)
History and Physical Interval Note:  02/15/2020 7:23 AM  Sherri Schultz  has presented today for surgery, with the diagnosis of LEFT BREAST CANCER, RIGHT BREAST ABNORMAL MRI,.  The various methods of treatment have been discussed with the patient and family. After consideration of risks, benefits and other options for treatment, the patient has consented to  Procedure(s): LEFT BREAST LUMPECTOMY WITH RADIOACTIVE SEED LOCALIZATION (Left) RIGHT BREAST RADIOACTIVE SEED LOCALIZATION EXCISIONAL BIOPSY X 2 (Right) as a surgical intervention.  The patient's history has been reviewed, patient examined, no change in status, stable for surgery.  I have reviewed the patient's chart and labs.  Questions were answered to the patient's satisfaction.     Stark Klein

## 2020-02-15 NOTE — Anesthesia Postprocedure Evaluation (Signed)
Anesthesia Post Note  Patient: Sherri Schultz  Procedure(s) Performed: LEFT BREAST LUMPECTOMY WITH RADIOACTIVE SEED LOCALIZATION (Left Breast) RIGHT BREAST RADIOACTIVE SEED LOCALIZATION EXCISIONAL BIOPSY X 2 (Right Breast)     Patient location during evaluation: PACU Anesthesia Type: General Level of consciousness: awake and alert Pain management: pain level controlled Vital Signs Assessment: post-procedure vital signs reviewed and stable Respiratory status: spontaneous breathing, nonlabored ventilation, respiratory function stable and patient connected to nasal cannula oxygen Cardiovascular status: blood pressure returned to baseline and stable Postop Assessment: no apparent nausea or vomiting Anesthetic complications: no    Last Vitals:  Vitals:   02/15/20 1026 02/15/20 1048  BP: 112/67 114/69  Pulse: 73 67  Resp: 16 18  Temp:  36.5 C  SpO2: 95% 96%    Last Pain:  Vitals:   02/15/20 1048  TempSrc:   PainSc: 3                  Sherri Schultz

## 2020-02-15 NOTE — Transfer of Care (Signed)
Immediate Anesthesia Transfer of Care Note  Patient: Sherri Schultz  Procedure(s) Performed: LEFT BREAST LUMPECTOMY WITH RADIOACTIVE SEED LOCALIZATION (Left Breast) RIGHT BREAST RADIOACTIVE SEED LOCALIZATION EXCISIONAL BIOPSY X 2 (Right Breast)  Patient Location: PACU  Anesthesia Type:General  Level of Consciousness: awake, alert  and oriented  Airway & Oxygen Therapy: Patient Spontanous Breathing and Patient connected to face mask oxygen  Post-op Assessment: Report given to RN and Post -op Vital signs reviewed and stable  Post vital signs: Reviewed and stable  Last Vitals:  Vitals Value Taken Time  BP    Temp    Pulse 76 02/15/20 0939  Resp 12 02/15/20 0939  SpO2 100 % 02/15/20 0939  Vitals shown include unvalidated device data.  Last Pain:  Vitals:   02/15/20 0718  TempSrc: Tympanic  PainSc: 0-No pain         Complications: No apparent anesthesia complications

## 2020-02-15 NOTE — Op Note (Signed)
Left Breast Radioactive seed localized lumpectomy, right seed localized excisional biopsy x2  Indications: This patient presents with history of left breast cancer, cTis, LIQ quadrant, ER/PR -.  She also had an abnormal right breast MRI with 2 discordant core needle biopsies  Pre-operative Diagnosis: left breast cancer, right abnormal breast MRI  Post-operative Diagnosis: Same  Surgeon: Stark Klein   Anesthesia: General endotracheal anesthesia  ASA Class: 2  Procedure Details  The patient was seen in the Holding Room. The risks, benefits, complications, treatment options, and expected outcomes were discussed with the patient. The possibilities of bleeding, infection, the need for additional procedures, failure to diagnose a condition, and creating a complication requiring other procedures or operations were discussed with the patient. The patient concurred with the proposed plan, giving informed consent.  The site of surgery properly noted/marked. The patient was taken to Operating Room # 8, identified, and the procedure verified as left breast seed localized lumpectomy and right breast seed localized excisional breast biopsy x 2  The bilateral breast and chest were prepped and draped in standard fashion. The right breast was addressed first as it was the NON cancer side.  A superolateral circumareolar incision was made near the previously placed radioactive seeds.  Dissection was carried down around the point of maximum signal intensity at the periareolar location first. The cautery was used to perform the dissection.   The specimen was inked with the margin marker paint kit. Specimen radiography confirmed inclusion of the mammographic lesion, the clip, and the seed.  The second seed was addressed similarly through the same incision in the upper outer quadrant.  The background signal in the breast was zero.  Hemostasis was achieved with cautery. 30 ml local was administered.  The wound was  irrigated and closed with 3-0 vicryl interrupted deep dermal sutures and 4-0 monocryl running subcuticular suture.      The left side was addressed similarly except clips were placed at the borders of the cavity prior to closing the skin. 30 mL were administered.     The cavity was marked with clips on each border other than the anterior border.  Sterile dressings were applied. At the end of the operation, all sponge, instrument, and needle counts were correct.   Findings: Seed, clip in specimen x 2 on right, seed and clip in specimen on left.  left side- anterior margin is skin and posterior margin is pectoralis   Estimated Blood Loss:  min         Specimens: left breast tissue with seed, right breast tissue with seed x 2.         Complications:  None; patient tolerated the procedure well.         Disposition: PACU - hemodynamically stable.         Condition: stable

## 2020-02-15 NOTE — Anesthesia Preprocedure Evaluation (Addendum)
Anesthesia Evaluation  Patient identified by MRN, date of birth, ID band Patient awake    Reviewed: Allergy & Precautions, NPO status , Patient's Chart, lab work & pertinent test results  Airway Mallampati: I  TM Distance: >3 FB Neck ROM: Full    Dental no notable dental hx. (+) Poor Dentition, Dental Advisory Given,    Pulmonary neg pulmonary ROS,    Pulmonary exam normal breath sounds clear to auscultation       Cardiovascular hypertension, Pt. on medications Normal cardiovascular exam Rhythm:Regular Rate:Normal     Neuro/Psych PSYCHIATRIC DISORDERS Anxiety negative neurological ROS     GI/Hepatic negative GI ROS, Neg liver ROS,   Endo/Other  negative endocrine ROS  Renal/GU negative Renal ROS  negative genitourinary   Musculoskeletal negative musculoskeletal ROS (+)   Abdominal   Peds  Hematology negative hematology ROS (+)   Anesthesia Other Findings   Reproductive/Obstetrics                            Anesthesia Physical Anesthesia Plan  ASA: II  Anesthesia Plan: General   Post-op Pain Management:    Induction: Intravenous  PONV Risk Score and Plan: 3 and Ondansetron, Dexamethasone and Midazolam  Airway Management Planned: LMA  Additional Equipment:   Intra-op Plan:   Post-operative Plan: Extubation in OR  Informed Consent: I have reviewed the patients History and Physical, chart, labs and discussed the procedure including the risks, benefits and alternatives for the proposed anesthesia with the patient or authorized representative who has indicated his/her understanding and acceptance.     Dental advisory given  Plan Discussed with: CRNA  Anesthesia Plan Comments:         Anesthesia Quick Evaluation

## 2020-02-16 ENCOUNTER — Encounter: Payer: Self-pay | Admitting: *Deleted

## 2020-02-16 LAB — SURGICAL PATHOLOGY

## 2020-02-22 ENCOUNTER — Encounter: Payer: Self-pay | Admitting: *Deleted

## 2020-03-06 ENCOUNTER — Telehealth: Payer: Self-pay

## 2020-03-06 NOTE — Telephone Encounter (Signed)
Appointment reminder for 03/07/20 patient did not answer the phone and her mailbox is full. Will try again later in the day

## 2020-03-06 NOTE — Telephone Encounter (Signed)
Spoke with patient about appointment on 03/07/20 verbalized she understood it was a telephone encounter

## 2020-03-07 ENCOUNTER — Other Ambulatory Visit: Payer: Self-pay

## 2020-03-07 ENCOUNTER — Ambulatory Visit
Admission: RE | Admit: 2020-03-07 | Discharge: 2020-03-07 | Disposition: A | Discharge status: Home / Self Care | Payer: BC Managed Care – PPO | Source: Ambulatory Visit | Attending: Radiation Oncology | Admitting: Radiation Oncology

## 2020-03-07 ENCOUNTER — Encounter: Payer: Self-pay | Admitting: Radiation Oncology

## 2020-03-07 DIAGNOSIS — Z170 Estrogen receptor positive status [ER+]: Secondary | ICD-10-CM | POA: Diagnosis not present

## 2020-03-07 DIAGNOSIS — Z510 Encounter for antineoplastic radiation therapy: Secondary | ICD-10-CM | POA: Diagnosis not present

## 2020-03-07 DIAGNOSIS — D0512 Intraductal carcinoma in situ of left breast: Secondary | ICD-10-CM | POA: Insufficient documentation

## 2020-03-07 NOTE — Progress Notes (Signed)
Radiation Oncology         [phone] ________________________________  Outpatient Follow Up- Conducted via telephone due to current COVID-19 concerns for limiting patient exposure  I spoke with the patient to conduct this consult visit via telephone to spare the patient unnecessary potential exposure in the healthcare setting during the current COVID-19 pandemic. 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   HISTORY OF PRESENT ILLNESS: Sherri Schultz is a 54 y.o. female  Originally seen in the multidisciplinary breast clinic for a new diagnosis of left breast cancer. The patient was noted to have a screening detected abnormality in the left breast. Diagnostic imaging revealed up to 3 lesions in the breast. This morning in conference the third lesion did not persist on diagnostic imaging, but there was a 1.8 x 1.8 x 1.6 cm lesion at 8:30, and at 10:00 a 4 x 3 x 2 mm lesion. The axilla was negative for adenopathy. She proceeded with a biopsy on 12/14/19 and at 10:00 the lesion was a fibroadenoma, and at 8:30, High Grade DCIS was noted, ER/PR was negative. She subsequently underwent MRI for extent of disease, which revealed an 8 x 2.5 x 1.2 cm area of non-mass-like enhancement in the lower inner left breast, no other suspicious findings were noted in the left breast, multiple patchy areas of non-mass-like enhancement were also noted in the anterior and central right breast. Due to these  findings she subsequently underwent biopsies in the upper anterior and upper outer right breast both of which revealed a complex sclerosing lesion.  She was taken to the operating room on 02/15/2020 for bilateral lumpectomy.  Her breast on the right revealed radial scar, fibrocystic changes with calcifications and pseudoangiomatous stromal hyperplasia with no evidence of malignancy.  This was also seen in the second lumpectomy specimen labeled upper outer quadrant.  In the left breast, she did have known DCIS with calcifications that were high-grade spanning 1.4 cm.  Her surgical margins were negative.  She is contacted today to discuss the rationale for adjuvant radiotherapy.   PREVIOUS RADIATION THERAPY: No   PAST MEDICAL HISTORY:  Past Medical History:  Diagnosis Date  . Anxiety   . Breast cancer ([place]) 2021   left breast DCIS  . Cancer [facility]) 2013   right knee  . Family history of brain cancer   . Family history of breast cancer   . Family history of colon cancer   . Family history of melanoma   . Hypertension   . Trimalleolar fracture of ankle, closed, left, initial encounter        PAST SURGICAL HISTORY: Past Surgical History:  Procedure Laterality Date  . BREAST BIOPSY Right 10+ yrs ago   benign  . BREAST LUMPECTOMY WITH RADIOACTIVE SEED LOCALIZATION Left 02/15/2020   Procedure: LEFT BREAST LUMPECTOMY WITH RADIOACTIVE SEED LOCALIZATION;  Surgeon: Stark Klein, MD;  Location: [place];  Service: General;  Laterality: Left;  Marland Kitchen MELANOMA  EXCISION Right 2013   knee  . ORIF ANKLE FRACTURE Left 05/06/2018   Procedure: OPEN REDUCTION INTERNAL FIXATION (ORIF) LEFT TRIMALLEOLAR ANKLE FRACTURE;  Surgeon: Leandrew Koyanagi, MD;  Location: [place];  Service: Orthopedics;  Laterality: Left;  . RADIOACTIVE SEED GUIDED EXCISIONAL BREAST BIOPSY Right 02/15/2020   Procedure: RIGHT BREAST RADIOACTIVE SEED LOCALIZATION EXCISIONAL BIOPSY X 2;  Surgeon: Stark Klein,  MD;  Location: [place];  Service: General;  Laterality: Right;     FAMILY HISTORY:  Family History  Problem Relation Age of Onset  . Breast cancer Mother 19  . Diabetes Mother   . Colon cancer Mother 14  . Breast cancer Maternal Aunt        dx. >50  . Diabetes Father   . Hypertension Father   . Heart attack Father   . Arthritis Father   . Diabetes Brother   . Melanoma Brother 41  . Diabetes Brother   . Breast cancer Cousin        dx. 40s/50s  . Brain cancer Maternal Uncle        dx. 51s  . Breast cancer Maternal Aunt        dx. >50  . Breast cancer Maternal Aunt        dx. >50  . Cancer Maternal Uncle        unknown type, dx. >50  . Breast cancer Cousin        dx. 40s/50s     SOCIAL HISTORY:  reports that she has never smoked. She has never used smokeless tobacco. She reports that she does not drink alcohol or use drugs. The patient is single and lives in [place]. She enjoys landscaping her house.   ALLERGIES: Patient has no known allergies.   MEDICATIONS:  Current Outpatient Medications  Medication Sig Dispense Refill  . calcium-vitamin D (OSCAL WITH D) 500-200 MG-UNIT tablet Take 1 tablet by mouth 3 (three) times daily. 90 tablet 12  . meclizine (ANTIVERT) 25 MG tablet Take 1 tablet by mouth 3 (three) times daily as needed.    Marland Kitchen oxyCODONE (OXY IR/ROXICODONE) 5 MG immediate release tablet Take 1 tablet (5 mg total) by mouth every 6 (six) hours as needed for severe pain. 15 tablet 0  . propranolol (INDERAL) 40 MG tablet Take 40 mg by mouth daily.    . valACYclovir (VALTREX) 1000 MG tablet Take 2 tablets by mouth 2 (two) times daily as needed.    . venlafaxine XR (EFFEXOR-XR) 150 MG 24 hr capsule Take 1 capsule by mouth daily.  0  . zinc sulfate 220 (50 Zn) MG capsule Take 1 capsule (220 mg total) by mouth daily. 42 capsule 0   No current facility-administered medications for this encounter.     REVIEW OF SYSTEMS: On review of systems, the  patient reports that she is doing well overall. She denies any chest pain, shortness of breath, cough, fevers, chills, night sweats, unintended weight changes. She denies any bowel or bladder disturbances, and denies abdominal pain, nausea or vomiting. She denies any new musculoskeletal or joint aches or pains, new skin lesions or concerns. A complete review of systems is obtained and is otherwise negative.     PHYSICAL EXAM:  Unable to assess due to encounter type.  ECOG = 0  0 - Asymptomatic (Fully active, able to carry on all predisease activities without restriction)  1 - Symptomatic but completely ambulatory (Restricted in physically strenuous activity but ambulatory and able to  carry out work of a light or sedentary nature. For example, light housework, office work)  2 - Symptomatic, <50% in bed during the day (Ambulatory and capable of all self care but unable to carry out any work activities. Up and about more than 50% of waking hours)  3 - Symptomatic, >50% in bed, but not bedbound (Capable of only limited self-care, confined to bed or chair 50% or more of waking hours)  4 - Bedbound (Completely disabled. Cannot carry on any self-care. Totally confined to bed or chair)  5 - Death   Eustace Pen MM, Creech RH, Tormey DC, et al. [phone]). "Toxicity and response criteria of the [facility]". [place] Oncol. 5 (6): 649-55    LABORATORY DATA:  Lab Results  Component Value Date   WBC 4.5 12/22/2019   HGB 12.4 12/22/2019   HCT 37.6 12/22/2019   MCV 93.1 12/22/2019   PLT 298 12/22/2019   Lab Results  Component Value Date   NA 142 12/22/2019   K 3.7 12/22/2019   CL 110 12/22/2019   CO2 24 12/22/2019   Lab Results  Component Value Date   ALT 16 12/22/2019   AST 18 12/22/2019   ALKPHOS 100 12/22/2019   BILITOT 0.3 12/22/2019      RADIOGRAPHY: MM Breast Surgical Specimen  Result Date: 02/15/2020 CLINICAL DATA:  Evaluate left-sided specimen EXAM:  SPECIMEN RADIOGRAPH OF THE LEFT BREAST COMPARISON:  Previous exam(s). FINDINGS: Status post excision of the left breast. The radioactive seed and biopsy marker clip are present, completely intact, and were marked for pathology. IMPRESSION: Specimen radiograph of the left breast. Electronically Signed   By: Dorise Bullion III M.D   On: 02/15/2020 09:19   MM Breast Surgical Specimen  Result Date: 02/15/2020 CLINICAL DATA:  Evaluate 2 specimens EXAM: SPECIMEN RADIOGRAPH OF THE RIGHT BREAST COMPARISON:  Previous exam(s). FINDINGS: Status post excision of the right breast. The radioactive seeds and biopsy marker clips are present, completely intact, and were marked for pathology. IMPRESSION: Specimen radiograph of the right breast. Electronically Signed   By: Dorise Bullion III M.D   On: 02/15/2020 09:00   MM Breast Surgical Specimen  Result Date: 02/15/2020 CLINICAL DATA:  Evaluate 2 specimens EXAM: SPECIMEN RADIOGRAPH OF THE RIGHT BREAST COMPARISON:  Previous exam(s). FINDINGS: Status post excision of the right breast. The radioactive seeds and biopsy marker clips are present, completely intact, and were marked for pathology. IMPRESSION: Specimen radiograph of the right breast. Electronically Signed   By: Dorise Bullion III M.D   On: 02/15/2020 09:00   MM LT RADIOACTIVE SEED LOC MAMMO GUIDE  Result Date: 02/14/2020 CLINICAL DATA:  54 year old female presenting for radioactive seed localization both breasts for a left lumpectomy and 2 excisional biopsies of the right breast. EXAM: MAMMOGRAPHIC GUIDED RADIOACTIVE SEED LOCALIZATION OF THE BILATERAL BREAST COMPARISON:  Previous exam(s). FINDINGS: Patient presents for radioactive seed localization prior to left breast lumpectomy 2 excisional right breast biopsies. I met with the patient and we discussed the procedure of seed localization including benefits and alternatives. We discussed the high likelihood of a successful procedure. We discussed the risks of  the procedure including infection, bleeding, tissue injury and further surgery. We discussed the low dose of radioactivity involved in the procedure. Informed, written consent was given. The usual time-out protocol was performed immediately prior to the procedure. Using mammographic guidance, sterile technique, 1% lidocaine and an I-125 radioactive seed, the ribbon shaped biopsy marking clip in the medial left  breast was localized using a medial approach. The follow-up mammogram images confirm the seed in the expected location and were marked for Dr. Barry Dienes. Follow-up survey of the patient confirms presence of the radioactive seed. Order number of I-125 seed:  [id number]. Total activity:  3.491 millicuries reference Date: 02/11/2020 ---------------------------------------------- Using mammographic guidance, sterile technique, 1% lidocaine and an I-125 radioactive seed, the dumbbell-shaped biopsy marking clip in the upper-outer posterior right breast was localized using a superior approach. The follow-up mammogram images confirm the seed in the expected location and were marked for Dr. Barry Dienes. Follow-up survey of the patient confirms presence of the radioactive seed. Order number of I-125 seed:  [id number]. Total activity:  9.480 millicuries reference Date: 02/11/2020 ---------------------------------------------- Using mammographic guidance, sterile technique, 1% lidocaine and an I-125 radioactive seed, the cylinder shaped biopsy marking clip in the upper outer anterior right breast was localized using a superior approach. The follow-up mammogram images confirm the seed in the expected location and were marked for Dr. Barry Dienes. Follow-up survey of the patient confirms presence of the radioactive seed. Order number of I-125 seed:  [id number]. Total activity:  7.078 millicuries reference Date: 02/11/2020 The patient tolerated the procedure well and was released from the [facility]. She was given instructions regarding  seed removal. IMPRESSION: Radioactive seed localization of 1 site in the left breast and 2 sites in the right breast. No apparent complications. Electronically Signed   By: Ammie Ferrier M.D.   On: 02/14/2020 11:27   MM RT RADIOACTIVE SEED LOC MAMMO GUIDE  Result Date: 02/14/2020 CLINICAL DATA:  54 year old female presenting for radioactive seed localization both breasts for a left lumpectomy and 2 excisional biopsies of the right breast. EXAM: MAMMOGRAPHIC GUIDED RADIOACTIVE SEED LOCALIZATION OF THE BILATERAL BREAST COMPARISON:  Previous exam(s). FINDINGS: Patient presents for radioactive seed localization prior to left breast lumpectomy 2 excisional right breast biopsies. I met with the patient and we discussed the procedure of seed localization including benefits and alternatives. We discussed the high likelihood of a successful procedure. We discussed the risks of the procedure including infection, bleeding, tissue injury and further surgery. We discussed the low dose of radioactivity involved in the procedure. Informed, written consent was given. The usual time-out protocol was performed immediately prior to the procedure. Using mammographic guidance, sterile technique, 1% lidocaine and an I-125 radioactive seed, the ribbon shaped biopsy marking clip in the medial left breast was localized using a medial approach. The follow-up mammogram images confirm the seed in the expected location and were marked for Dr. Barry Dienes. Follow-up survey of the patient confirms presence of the radioactive seed. Order number of I-125 seed:  [id number]. Total activity:  0.071 millicuries reference Date: 02/11/2020 ---------------------------------------------- Using mammographic guidance, sterile technique, 1% lidocaine and an I-125 radioactive seed, the dumbbell-shaped biopsy marking clip in the upper-outer posterior right breast was localized using a superior approach. The follow-up mammogram images confirm the seed in the  expected location and were marked for Dr. Barry Dienes. Follow-up survey of the patient confirms presence of the radioactive seed. Order number of I-125 seed:  [id number]. Total activity:  5.498 millicuries reference Date: 02/11/2020 ---------------------------------------------- Using mammographic guidance, sterile technique, 1% lidocaine and an I-125 radioactive seed, the cylinder shaped biopsy marking clip in the upper outer anterior right breast was localized using a superior approach. The follow-up mammogram images confirm the seed in the expected location and were marked for Dr. Barry Dienes. Follow-up survey of the patient confirms presence of the radioactive seed. Order number of I-125  seed:  [id number]. Total activity:  7.673 millicuries reference Date: 02/11/2020 The patient tolerated the procedure well and was released from the [facility]. She was given instructions regarding seed removal. IMPRESSION: Radioactive seed localization of 1 site in the left breast and 2 sites in the right breast. No apparent complications. Electronically Signed   By: Ammie Ferrier M.D.   On: 02/14/2020 11:27   MM RT RADIO SEED EA ADD LESION LOC MAMMO  Result Date: 02/14/2020 CLINICAL DATA:  54 year old female presenting for radioactive seed localization both breasts for a left lumpectomy and 2 excisional biopsies of the right breast. EXAM: MAMMOGRAPHIC GUIDED RADIOACTIVE SEED LOCALIZATION OF THE BILATERAL BREAST COMPARISON:  Previous exam(s). FINDINGS: Patient presents for radioactive seed localization prior to left breast lumpectomy 2 excisional right breast biopsies. I met with the patient and we discussed the procedure of seed localization including benefits and alternatives. We discussed the high likelihood of a successful procedure. We discussed the risks of the procedure including infection, bleeding, tissue injury and further surgery. We discussed the low dose of radioactivity involved in the procedure. Informed, written  consent was given. The usual time-out protocol was performed immediately prior to the procedure. Using mammographic guidance, sterile technique, 1% lidocaine and an I-125 radioactive seed, the ribbon shaped biopsy marking clip in the medial left breast was localized using a medial approach. The follow-up mammogram images confirm the seed in the expected location and were marked for Dr. Barry Dienes. Follow-up survey of the patient confirms presence of the radioactive seed. Order number of I-125 seed:  [id number]. Total activity:  0.973 millicuries reference Date: 02/11/2020 ---------------------------------------------- Using mammographic guidance, sterile technique, 1% lidocaine and an I-125 radioactive seed, the dumbbell-shaped biopsy marking clip in the upper-outer posterior right breast was localized using a superior approach. The follow-up mammogram images confirm the seed in the expected location and were marked for Dr. Barry Dienes. Follow-up survey of the patient confirms presence of the radioactive seed. Order number of I-125 seed:  [id number]. Total activity:  8.341 millicuries reference Date: 02/11/2020 ---------------------------------------------- Using mammographic guidance, sterile technique, 1% lidocaine and an I-125 radioactive seed, the cylinder shaped biopsy marking clip in the upper outer anterior right breast was localized using a superior approach. The follow-up mammogram images confirm the seed in the expected location and were marked for Dr. Barry Dienes. Follow-up survey of the patient confirms presence of the radioactive seed. Order number of I-125 seed:  [id number]. Total activity:  9.211 millicuries reference Date: 02/11/2020 The patient tolerated the procedure well and was released from the [facility]. She was given instructions regarding seed removal. IMPRESSION: Radioactive seed localization of 1 site in the left breast and 2 sites in the right breast. No apparent complications. Electronically Signed    By: Ammie Ferrier M.D.   On: 02/14/2020 11:27       IMPRESSION/PLAN: 1. High Grade ER/PR negative DCIS of the left breast. Dr. Lisbeth Renshaw reviews the final pathology findings and reviews the nature of noninvasive left breast disease. She has done well since surgery. She is a candidate for breast conservation with lumpectomy. Dr. Lisbeth Renshaw discusses that she would benefit from external radiotherapy to the breast to reduce the risks of local recurrence in addition to antiestrogen therapy. We discussed the risks, benefits, short, and long term effects of radiotherapy, and the patient is interested in proceeding. Dr. Lisbeth Renshaw discusses the delivery and logistics of radiotherapy and anticipates a course of 6 1/2 weeks of radiotherapy. She will come later today for simulation at  which time she will sign consent to proceed.    Given current concerns for patient exposure during the COVID-19 pandemic, this encounter was conducted via telephone.  The patient has provided two factor identification and has given verbal consent for this type of encounter and has been advised to only accept a meeting of this type in a secure network environment. The time spent during this encounter was 35 minutes including preparation, discussion, and coordination of the patient's care. The attendants for this meeting include Dr. Lisbeth Renshaw, Hayden Pedro  and Colletta Maryland Paschall.  During the encounter, Dr. Lisbeth Renshaw, and Hayden Pedro were located at [facility] Radiation Oncology Department.  Tashai Catino Paschall was located at home.    The above documentation reflects my direct findings during this shared patient visit. Please see the separate note by Dr. Lisbeth Renshaw on this date for the remainder of the patient's plan of care.    Carola Rhine, PAC

## 2020-03-08 ENCOUNTER — Encounter: Payer: Self-pay | Admitting: *Deleted

## 2020-03-13 DIAGNOSIS — D0512 Intraductal carcinoma in situ of left breast: Secondary | ICD-10-CM | POA: Diagnosis not present

## 2020-03-20 ENCOUNTER — Other Ambulatory Visit: Payer: Self-pay

## 2020-03-20 ENCOUNTER — Ambulatory Visit
Admission: RE | Admit: 2020-03-20 | Discharge: 2020-03-20 | Disposition: A | Discharge status: Home / Self Care | Payer: BC Managed Care – PPO | Source: Ambulatory Visit | Attending: Radiation Oncology | Admitting: Radiation Oncology

## 2020-03-20 DIAGNOSIS — D0512 Intraductal carcinoma in situ of left breast: Secondary | ICD-10-CM | POA: Diagnosis not present

## 2020-03-21 ENCOUNTER — Ambulatory Visit
Admission: RE | Admit: 2020-03-21 | Discharge: 2020-03-21 | Disposition: A | Discharge status: Home / Self Care | Payer: BC Managed Care – PPO | Source: Ambulatory Visit | Attending: Radiation Oncology | Admitting: Radiation Oncology

## 2020-03-21 ENCOUNTER — Other Ambulatory Visit: Payer: Self-pay

## 2020-03-21 DIAGNOSIS — D0512 Intraductal carcinoma in situ of left breast: Secondary | ICD-10-CM | POA: Diagnosis not present

## 2020-03-22 ENCOUNTER — Ambulatory Visit
Admission: RE | Admit: 2020-03-22 | Discharge: 2020-03-22 | Disposition: A | Discharge status: Home / Self Care | Payer: BC Managed Care – PPO | Source: Ambulatory Visit | Attending: Radiation Oncology | Admitting: Radiation Oncology

## 2020-03-22 ENCOUNTER — Inpatient Hospital Stay: Payer: BC Managed Care – PPO | Attending: Oncology | Admitting: Oncology

## 2020-03-22 ENCOUNTER — Other Ambulatory Visit: Payer: Self-pay

## 2020-03-22 VITALS — BP 113/71 | HR 69 | Temp 97.8°F | Resp 18 | Ht 64.0 in | Wt 163.9 lb

## 2020-03-22 DIAGNOSIS — Z79810 Long term (current) use of selective estrogen receptor modulators (SERMs): Secondary | ICD-10-CM | POA: Diagnosis not present

## 2020-03-22 DIAGNOSIS — C4371 Malignant melanoma of right lower limb, including hip: Secondary | ICD-10-CM | POA: Diagnosis not present

## 2020-03-22 DIAGNOSIS — D0512 Intraductal carcinoma in situ of left breast: Secondary | ICD-10-CM | POA: Insufficient documentation

## 2020-03-22 DIAGNOSIS — Z171 Estrogen receptor negative status [ER-]: Secondary | ICD-10-CM | POA: Diagnosis not present

## 2020-03-22 DIAGNOSIS — Z85820 Personal history of malignant melanoma of skin: Secondary | ICD-10-CM | POA: Insufficient documentation

## 2020-03-22 DIAGNOSIS — Z808 Family history of malignant neoplasm of other organs or systems: Secondary | ICD-10-CM | POA: Diagnosis not present

## 2020-03-22 DIAGNOSIS — Z803 Family history of malignant neoplasm of breast: Secondary | ICD-10-CM | POA: Diagnosis not present

## 2020-03-22 DIAGNOSIS — F419 Anxiety disorder, unspecified: Secondary | ICD-10-CM | POA: Diagnosis not present

## 2020-03-22 DIAGNOSIS — Z800 Family history of malignant neoplasm of digestive organs: Secondary | ICD-10-CM | POA: Insufficient documentation

## 2020-03-22 DIAGNOSIS — I10 Essential (primary) hypertension: Secondary | ICD-10-CM | POA: Insufficient documentation

## 2020-03-22 DIAGNOSIS — Z79899 Other long term (current) drug therapy: Secondary | ICD-10-CM | POA: Insufficient documentation

## 2020-03-22 MED ORDER — TAMOXIFEN CITRATE 20 MG PO TABS: 20.0000 mg | ORAL_TABLET | Freq: Every day | ORAL | 12 refills | Status: DC

## 2020-03-22 MED ORDER — TAMOXIFEN CITRATE 20 MG PO TABS: 20.0000 mg | ORAL_TABLET | Freq: Every day | ORAL | 12 refills | Status: AC

## 2020-03-22 NOTE — Progress Notes (Signed)
[facility]  [phone] [phone]     ID: Sherri Schultz DOB: [birth year]  MR#: [id number]  HEN#:[id number]  Patient Care Team: Vicenta Aly, [place] as PCP - General (Nurse Practitioner) Rockwell Germany, RN as Oncology Nurse Navigator Mauro Kaufmann, RN as Oncology Nurse Navigator Stark Klein, MD as Consulting Physician (General Surgery) Lataisha Colan, Virgie Dad, MD as Consulting Physician (Oncology) Kyung Rudd, MD as Consulting Physician (Radiation Oncology) Leandrew Koyanagi, MD as Attending Physician (Orthopedic Surgery) Chauncey Cruel, MD OTHER MD:  CHIEF COMPLAINT: 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 She was evaluated in the multidisciplinary breast cancer clinic on 12/22/2019.   Her genetic testing results were negative.  She underwent breast MRI on 12/29/2019 showing: breast composition B; 2.5 cm biopsy-proven DCIS within lower-inner left breast; multiple patchy areas of non-masslike enhancement throughout right breast; no abnormal lymph nodes.  She proceeded to biopsies of the right breast area of enhancement on 01/21/2020. Pathology from the procedure [id number]) showed complex sclerosing lesion in both samples.  She opted to proceed with bilateral lumpectomies on 02/15/2020 under Dr. Barry Dienes. 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  The right breast was negative for malignancy: 1. Periareolar  - fibrocystic changes with calcifications  - pseudoangiomatous stromal hyperplasia 2. Upper-outer Quadrant  - fibroadenoma  - fibrocystic changes  She met back with Dr. Lisbeth Renshaw on 03/07/2020 to discuss radiation therapy. She began treatment on 03/20/2020 and is scheduled to finish on 05/04/2020.   REVIEW OF  SYSTEMS: Sherri Schultz did well with her surgery, with no significant pain, bleeding, or fever.  She is tolerating radiation well so far.  She has not started exercising again because a lot of movement is uncomfortable particularly in the right breast where she has soreness sensitivity and some shooting pains.  Aside from these issues a detailed review of systems was stable.   HISTORY OF CURRENT ILLNESS: From the original intake note:  CHERINE Schultz had routine screening mammography on 12/01/2019 showing a possible abnormalities in the left breast. She underwent left diagnostic mammography with tomography and left breast ultrasonography at [facility] on 12/08/2019 showing: breast density category C; suspicious 1.5 cm mass involving the lower-inner quadrant of the left breast at 8:30, with a hyperechoic halo surrounding the mass for a total span of 1.8 cm; indeterminate 0.4 cm mass in the upper-inner quadrant of the left breast at 10 o'clock; no pathologic left axillary lymphadenopathy; benign cyst in the upper-outer subareolar left breast.  Accordingly on 12/14/2019 she proceeded to biopsy of the left breast areas in question. The pathology from this procedure ([id number]) showed:  1. Left breast, 8:30   - ductal carcinoma in situ, grade 3, with apocrine features (0.6 cm on biopsy)  - estrogen receptor, 0% negative and progesterone receptor, 0% negative.  2. Left breast, 10 o'clock  - fibroadenomatoid nodule with columnar cells changes  The patient's subsequent history is as detailed below.   PAST MEDICAL HISTORY: Past Medical History:  Diagnosis Date  . Anxiety   . Breast cancer ([place]) 2021   left breast DCIS  . Cancer [facility] 2013   right knee  . Family history of brain cancer   . Family history of breast cancer   . Family history of colon cancer   . Family history  of melanoma   . Hypertension   . Trimalleolar fracture of ankle, closed, left, initial encounter     PAST SURGICAL  HISTORY: Past Surgical History:  Procedure Laterality Date  . BREAST BIOPSY Right 10+ yrs ago   benign  . BREAST LUMPECTOMY WITH RADIOACTIVE SEED LOCALIZATION Left 02/15/2020   Procedure: LEFT BREAST LUMPECTOMY WITH RADIOACTIVE SEED LOCALIZATION;  Surgeon: Stark Klein, MD;  Location: [facility];  Service: General;  Laterality: Left;  Marland Kitchen MELANOMA EXCISION Right 2013   knee  . ORIF ANKLE FRACTURE Left 05/06/2018   Procedure: OPEN REDUCTION INTERNAL FIXATION (ORIF) LEFT TRIMALLEOLAR ANKLE FRACTURE;  Surgeon: Leandrew Koyanagi, MD;  Location: [facility];  Service: Orthopedics;  Laterality: Left;  . RADIOACTIVE SEED GUIDED EXCISIONAL BREAST BIOPSY Right 02/15/2020   Procedure: RIGHT BREAST RADIOACTIVE SEED LOCALIZATION EXCISIONAL BIOPSY X 2;  Surgeon: Stark Klein, MD;  Location: [facility];  Service: General;  Laterality: Right;    FAMILY HISTORY: Family History  Problem Relation Age of Onset  . Breast cancer Mother 69  . Diabetes Mother   . Colon cancer Mother 70  . Breast cancer Maternal Aunt        dx. >50  . Diabetes Father   . Hypertension Father   . Heart attack Father   . Arthritis Father   . Diabetes Brother   . Melanoma Brother 89  . Diabetes Brother   . Breast cancer Cousin        dx. 40s/50s  . Brain cancer Maternal Uncle        dx. 26s  . Breast cancer Maternal Aunt        dx. >50  . Breast cancer Maternal Aunt        dx. >50  . Cancer Maternal Uncle        unknown type, dx. >50  . Breast cancer Cousin        dx. 40s/50s   The patient's father died at age 77 from congestive heart failure.  Patient's mother died from breast cancer at age 58, diagnosed age 39.. The patient denies a family hx of ovarian cancer. She reports breast cancer in 2 maternal aunts and 2 maternal first cousins.  The patient has 2 brothers with no history of cancer.   GYNECOLOGIC HISTORY:  Patient's last menstrual period was 03/26/2016. Menarche: 54  years old [place] P 0 LMP 2017 HRT no  Hysterectomy? no BSO? 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  They mostly have trees in their 30 acres.  Her husband Cheila Wickstrom (the patient kept her name on her insurance) used to work as a Clinical cytogeneticist for [employer] but is now also retired.  They have no children and no pets.  The patient attends a local [employer] DIRECTIVES: In the absence of any documents to the contrary the patient's husband is her healthcare power of attorney   HEALTH MAINTENANCE: Social History   Tobacco Use  . Smoking status: Never Smoker  . Smokeless tobacco: Never Used  Substance Use Topics  . Alcohol use: Never  . Drug use: Never     Colonoscopy: Never  PAP: 04/2016, negative  Bone density: Never   No Known Allergies  Current Outpatient Medications  Medication Sig Dispense Refill  . calcium-vitamin D (OSCAL WITH D) 500-200 MG-UNIT tablet Take 1 tablet by mouth 3 (three) times  daily. 90 tablet 12  . meclizine (ANTIVERT) 25 MG tablet Take 1 tablet by mouth 3 (three) times daily as needed.    . propranolol (INDERAL) 40 MG tablet Take 40 mg by mouth daily.    . tamoxifen (NOLVADEX) 20 MG tablet Take 1 tablet (20 mg total) by mouth daily. Start May 29, 2020 90 tablet 12  . valACYclovir (VALTREX) 1000 MG tablet Take 2 tablets by mouth 2 (two) times daily as needed.    . venlafaxine XR (EFFEXOR-XR) 150 MG 24 hr capsule Take 1 capsule by mouth daily.  0  . zinc sulfate 220 (50 Zn) MG capsule Take 1 capsule (220 mg total) by mouth daily. 42 capsule 0   No current facility-administered medications for this visit.    OBJECTIVE: white woman who appears younger than stated age  18:   03/22/20 1315  BP: 113/71  Pulse: 69  Resp: 18  Temp: 97.8 F (36.6 C)  SpO2: 100%     Body mass index is 28.13 kg/m.   Wt Readings from Last 3 Encounters:  03/22/20 163 lb 14.4 oz (74.3  kg)  02/15/20 162 lb 11.2 oz (73.8 kg)  12/22/19 159 lb 14.4 oz (72.5 kg)      ECOG FS:1 - Symptomatic but completely ambulatory  Sclerae unicteric, EOMs intact Wearing a mask No cervical or supraclavicular adenopathy Lungs no rales or rhonchi Heart regular rate and rhythm Abd soft, nontender, positive bowel sounds MSK no focal spinal tenderness, no upper extremity lymphedema Neuro: nonfocal, well oriented, appropriate affect Breasts: The right breast is status post lumpectomy and it is currently receiving radiation.  The cosmetic result is excellent.  There is minimal swelling.  The left breast is benign.  Both axillae are benign.   LAB RESULTS:  CMP     Component Value Date/Time   NA 142 12/22/2019 0827   K 3.7 12/22/2019 0827   CL 110 12/22/2019 0827   CO2 24 12/22/2019 0827   GLUCOSE 101 (H) 12/22/2019 0827   BUN 7 12/22/2019 0827   CREATININE 0.90 12/22/2019 0827   CALCIUM 8.7 (L) 12/22/2019 0827   PROT 7.1 12/22/2019 0827   ALBUMIN 3.9 12/22/2019 0827   AST 18 12/22/2019 0827   ALT 16 12/22/2019 0827   ALKPHOS 100 12/22/2019 0827   BILITOT 0.3 12/22/2019 0827   GFRNONAA >60 12/22/2019 0827   GFRAA >60 12/22/2019 0827    No results found for: TOTALPROTELP, ALBUMINELP, A1GS, A2GS, BETS, BETA2SER, GAMS, MSPIKE, SPEI  Lab Results  Component Value Date   WBC 4.5 12/22/2019   NEUTROABS 2.1 12/22/2019   HGB 12.4 12/22/2019   HCT 37.6 12/22/2019   MCV 93.1 12/22/2019   PLT 298 12/22/2019    No results found for: LABCA2  No components found for: FTDDUK025  No results for input(s): INR in the last 168 hours.  No results found for: LABCA2  No results found for: KYH062  No results found for: BJS283  No results found for: TDV761  No results found for: CA2729  No components found for: HGQUANT  No results found for: CEA1 / No results found for: CEA1   No results found for: AFPTUMOR  No results found for: CHROMOGRNA  No results found for:  KPAFRELGTCHN, LAMBDASER, KAPLAMBRATIO (kappa/lambda light chains)  No results found for: HGBA, HGBA2QUANT, HGBFQUANT, HGBSQUAN (Hemoglobinopathy evaluation)   No results found for: LDH  No results found for: IRON, TIBC, IRONPCTSAT (Iron and TIBC)  No results found for: FERRITIN  Urinalysis  No results found for: COLORURINE, APPEARANCEUR, LABSPEC, PHURINE, GLUCOSEU, HGBUR, BILIRUBINUR, KETONESUR, PROTEINUR, UROBILINOGEN, NITRITE, LEUKOCYTESUR   STUDIES: No results found.   ELIGIBLE FOR AVAILABLE RESEARCH PROTOCOL: No  ASSESSMENT: 54 y.o. [place] woman status post left breast biopsy 12/14/2019 for ductal carcinoma in situ, clinically 1.8 cm, grade 3, estrogen and progesterone receptor negative  (1) genetics testing 01/01/2020 through the STAT Breast cancer panel offered by [facility] found no deleterious mutations in  ATM, BRCA1, BRCA2, CDH1, CHEK2, PALB2, PTEN, STK11 and TP53.  The Multi-Cancer Panel offered by [facility] includes also found no deleterious mutations inAIP, ALK, APC, ATM, AXIN2,BAP1,  BARD1, BLM, BMPR1A, BRCA1, BRCA2, BRIP1, CASR, CDC73, CDH1, CDK4, CDKN1B, CDKN1C, CDKN2A (p14ARF), CDKN2A (p16INK4a), CEBPA, CHEK2, CTNNA1, DICER1, DIS3L2, EGFR (c.2369C>T, p.Thr790Met variant only), EPCAM (Deletion/duplication testing only), FH, FLCN, GATA2, GPC3, GREM1 (Promoter region deletion/duplication testing only), HOXB13 (c.251G>A, p.Gly84Glu), HRAS, KIT, MAX, MEN1, MET, MITF (c.952G>A, p.Glu318Lys variant only), MLH1, MSH2, MSH3, MSH6, MUTYH, NBN, NF1, NF2, NTHL1, PALB2, PDGFRA, PHOX2B, PMS2, POLD1, POLE, POT1, PRKAR1A, PTCH1, PTEN, RAD50, RAD51C, RAD51D, RB1, RECQL4, RET, RNF43, RUNX1, SDHAF2, SDHA (sequence changes only), SDHB, SDHC, SDHD, SMAD4, SMARCA4, SMARCB1, SMARCE1, STK11, SUFU, TERC, TERT, TMEM127, TP53, TSC1, TSC2, VHL, WRN and WT1.     (a) a variant of  uncertain significance was detected in the MSH3 gene called c.230_232dup (p.Pro77dup).   (2) status post bilateral  lumpectomies 02/15/2020, showing  (a) on the right, no evidence of malignancy (radial scar, fibroadenoma).  (b) on the left, ductal carcinoma in situ, grade 3, measuring 1.4 cm, with negative margins  (3) adjuvant radiation to be completed 05/04/2020  (4) to start tamoxifen 05/29/2020   PLAN: Clary did very well with her surgery and so far is doing well with her radiation.  She will finish up early to mid June.  Today we discussed antiestrogens in detail. @PATIENTFIRSTNAME @   has completed her local treatment and is now ready to start anti-estrogens.  We discussed the difference between tamoxifen and anastrozole in detail. She understands that anastrozole and the aromatase inhibitors in general work by blocking estrogen production. Accordingly vaginal dryness, decrease in bone density, and of course hot flashes can result. The aromatase inhibitors can also negatively affect the cholesterol profile, although that is a minor effect. One out of 5 women on aromatase inhibitors we will feel "old and achy". This arthralgia/myalgia syndrome, which resembles fibromyalgia clinically, does resolve with stopping the medications. Accordingly this is not a reason to not try an aromatase inhibitor but it is a frequent reason to stop it (in other words 20% of women will not be able to tolerate these medications).  Tamoxifen on the other hand does not block estrogen production. It does not "take away a woman's estrogen". It blocks the estrogen receptor in breast cells. Like anastrozole, it can also cause hot flashes. As opposed to anastrozole, tamoxifen has many estrogen-like effects. It is technically an estrogen receptor modulator. This means that in some tissues tamoxifen works like estrogen-- for example it helps strengthen the bones. It tends to improve the cholesterol profile. It can cause thickening of the endometrial lining, and even endometrial polyps or rarely cancer of the uterus.(The risk of uterine  cancer due to tamoxifen is one additional cancer per thousand women year). It can cause vaginal wetness or stickiness. It can cause blood clots through this estrogen-like effect--the risk of blood clots with tamoxifen is exactly the same as with birth control pills or hormone replacement.  Neither of these agents causes  mood changes or weight gain, despite the popular belief that they can have these side effects. We have data from studies comparing either of these drugs with placebo, and in those cases the control group had the same amount of weight gain and depression as the group that took the drug.  She is very interested in tamoxifen.  She would like to use vaginal estrogens if possible and this is a safe way of doing it.  In addition she likes the benefits to the bones.  I would like her to start after the July 4 holiday, on 05/29/2020, so she has had a little bit of time to recover from radiation.  I will then have a virtual visit with her in August just to make sure she is tolerating tamoxifen well and to see if she wants to start vaginal estrogens at that time.  She will be pricing these before that visit.  She will then have mammography early October and see me in October and if all goes well I will see her yearly thereafter for the next 5 years.  Chad is aware that she is behind on her health maintenance.  She has never had a colonoscopy.  She has not had a formal skin exam by a dermatologist in more than 5 years despite her history of melanoma.  When she returns to see me we will discuss updating these.  This is a good year to do it since she will have met her deductible.  She knows to call for any other issue that may develop before the next visit  Total encounter time 30 minutes.Chauncey Cruel, MD   03/22/2020 1:50 PM Medical Oncology and Hematology [facility] [place], [place] [address] Tel. [phone]    Fax. [phone]   This document  serves as a record of services personally performed by Lurline Del, MD. It was created on his behalf by Wilburn Mylar, a trained medical scribe. The creation of this record is based on the scribe's personal observations and the provider's statements to them.   I, Lurline Del MD, have reviewed the above documentation for accuracy and completeness, and I agree with the above.   *Total Encounter Time as defined by the Centers for Medicare and Medicaid Services includes, in addition to the face-to-face time of a patient visit (documented in the note above) non-face-to-face time: obtaining and reviewing outside history, ordering and reviewing medications, tests or procedures, care coordination (communications with other health care professionals or caregivers) and documentation in the medical record.

## 2020-03-23 ENCOUNTER — Other Ambulatory Visit: Payer: Self-pay

## 2020-03-23 ENCOUNTER — Ambulatory Visit
Admission: RE | Admit: 2020-03-23 | Discharge: 2020-03-23 | Disposition: A | Discharge status: Home / Self Care | Payer: BC Managed Care – PPO | Source: Ambulatory Visit | Attending: Radiation Oncology | Admitting: Radiation Oncology

## 2020-03-23 DIAGNOSIS — D0512 Intraductal carcinoma in situ of left breast: Secondary | ICD-10-CM

## 2020-03-23 MED ORDER — SONAFINE EX EMUL
1.0000 "application " | Freq: Once | CUTANEOUS | Status: AC
  Administered 2020-03-23: 1 via TOPICAL

## 2020-03-23 MED ORDER — ALRA NON-METALLIC DEODORANT (RAD-ONC)
1.0000 "application " | Freq: Once | TOPICAL | Status: AC
  Administered 2020-03-23: 1 via TOPICAL

## 2020-03-23 NOTE — Progress Notes (Signed)
Pt here for patient teaching.  Pt given Radiation and You booklet, skin care instructions, Alra deodorant and Sonafine.  Reviewed areas of pertinence such as fatigue, hair loss, skin changes, breast tenderness and breast swelling . Pt able to give teach back of to pat skin and use unscented/gentle soap,apply Sonafine bid, avoid applying anything to skin within 4 hours of treatment, avoid wearing an under wire bra and to use an electric razor if they must shave. Pt verbalizes understanding of information given and will contact nursing with any questions or concerns.     Maveric Debono M. Nori Poland RN, BSN             

## 2020-03-24 ENCOUNTER — Other Ambulatory Visit: Payer: Self-pay

## 2020-03-24 ENCOUNTER — Ambulatory Visit
Admission: RE | Admit: 2020-03-24 | Discharge: 2020-03-24 | Disposition: A | Discharge status: Home / Self Care | Payer: BC Managed Care – PPO | Source: Ambulatory Visit | Attending: Radiation Oncology | Admitting: Radiation Oncology

## 2020-03-24 DIAGNOSIS — D0512 Intraductal carcinoma in situ of left breast: Secondary | ICD-10-CM | POA: Diagnosis not present

## 2020-03-27 ENCOUNTER — Ambulatory Visit
Admission: RE | Admit: 2020-03-27 | Discharge: 2020-03-27 | Disposition: A | Discharge status: Home / Self Care | Payer: BC Managed Care – PPO | Source: Ambulatory Visit | Attending: Radiation Oncology | Admitting: Radiation Oncology

## 2020-03-27 ENCOUNTER — Other Ambulatory Visit: Payer: Self-pay

## 2020-03-27 DIAGNOSIS — D0512 Intraductal carcinoma in situ of left breast: Secondary | ICD-10-CM | POA: Insufficient documentation

## 2020-03-27 DIAGNOSIS — Z510 Encounter for antineoplastic radiation therapy: Secondary | ICD-10-CM | POA: Diagnosis not present

## 2020-03-27 DIAGNOSIS — Z170 Estrogen receptor positive status [ER+]: Secondary | ICD-10-CM | POA: Diagnosis not present

## 2020-03-28 ENCOUNTER — Other Ambulatory Visit: Payer: Self-pay

## 2020-03-28 ENCOUNTER — Ambulatory Visit
Admission: RE | Admit: 2020-03-28 | Discharge: 2020-03-28 | Disposition: A | Discharge status: Home / Self Care | Payer: BC Managed Care – PPO | Source: Ambulatory Visit | Attending: Radiation Oncology | Admitting: Radiation Oncology

## 2020-03-28 DIAGNOSIS — D0512 Intraductal carcinoma in situ of left breast: Secondary | ICD-10-CM | POA: Diagnosis not present

## 2020-03-29 ENCOUNTER — Ambulatory Visit
Admission: RE | Admit: 2020-03-29 | Discharge: 2020-03-29 | Disposition: A | Discharge status: Home / Self Care | Payer: BC Managed Care – PPO | Source: Ambulatory Visit | Attending: Radiation Oncology | Admitting: Radiation Oncology

## 2020-03-29 ENCOUNTER — Other Ambulatory Visit: Payer: Self-pay

## 2020-03-29 DIAGNOSIS — D0512 Intraductal carcinoma in situ of left breast: Secondary | ICD-10-CM | POA: Diagnosis not present

## 2020-03-30 ENCOUNTER — Ambulatory Visit
Admission: RE | Admit: 2020-03-30 | Discharge: 2020-03-30 | Disposition: A | Discharge status: Home / Self Care | Payer: BC Managed Care – PPO | Source: Ambulatory Visit | Attending: Radiation Oncology | Admitting: Radiation Oncology

## 2020-03-30 ENCOUNTER — Other Ambulatory Visit: Payer: Self-pay

## 2020-03-30 DIAGNOSIS — D0512 Intraductal carcinoma in situ of left breast: Secondary | ICD-10-CM | POA: Diagnosis not present

## 2020-03-31 ENCOUNTER — Ambulatory Visit
Admission: RE | Admit: 2020-03-31 | Discharge: 2020-03-31 | Disposition: A | Discharge status: Home / Self Care | Payer: BC Managed Care – PPO | Source: Ambulatory Visit | Attending: Radiation Oncology | Admitting: Radiation Oncology

## 2020-03-31 ENCOUNTER — Other Ambulatory Visit: Payer: Self-pay

## 2020-03-31 DIAGNOSIS — D0512 Intraductal carcinoma in situ of left breast: Secondary | ICD-10-CM | POA: Diagnosis not present

## 2020-04-03 ENCOUNTER — Ambulatory Visit
Admission: RE | Admit: 2020-04-03 | Discharge: 2020-04-03 | Disposition: A | Discharge status: Home / Self Care | Payer: BC Managed Care – PPO | Source: Ambulatory Visit | Attending: Radiation Oncology | Admitting: Radiation Oncology

## 2020-04-03 ENCOUNTER — Other Ambulatory Visit: Payer: Self-pay

## 2020-04-03 DIAGNOSIS — D0512 Intraductal carcinoma in situ of left breast: Secondary | ICD-10-CM | POA: Diagnosis not present

## 2020-04-04 ENCOUNTER — Ambulatory Visit
Admission: RE | Admit: 2020-04-04 | Discharge: 2020-04-04 | Disposition: A | Discharge status: Home / Self Care | Payer: BC Managed Care – PPO | Source: Ambulatory Visit | Attending: Radiation Oncology | Admitting: Radiation Oncology

## 2020-04-04 ENCOUNTER — Other Ambulatory Visit: Payer: Self-pay

## 2020-04-04 DIAGNOSIS — D0512 Intraductal carcinoma in situ of left breast: Secondary | ICD-10-CM | POA: Diagnosis not present

## 2020-04-05 ENCOUNTER — Other Ambulatory Visit: Payer: Self-pay

## 2020-04-05 ENCOUNTER — Ambulatory Visit
Admission: RE | Admit: 2020-04-05 | Discharge: 2020-04-05 | Disposition: A | Discharge status: Home / Self Care | Payer: BC Managed Care – PPO | Source: Ambulatory Visit | Attending: Radiation Oncology | Admitting: Radiation Oncology

## 2020-04-05 DIAGNOSIS — D0512 Intraductal carcinoma in situ of left breast: Secondary | ICD-10-CM | POA: Diagnosis not present

## 2020-04-06 ENCOUNTER — Other Ambulatory Visit: Payer: Self-pay

## 2020-04-06 ENCOUNTER — Ambulatory Visit
Admission: RE | Admit: 2020-04-06 | Discharge: 2020-04-06 | Disposition: A | Discharge status: Home / Self Care | Payer: BC Managed Care – PPO | Source: Ambulatory Visit | Attending: Radiation Oncology | Admitting: Radiation Oncology

## 2020-04-06 DIAGNOSIS — D0512 Intraductal carcinoma in situ of left breast: Secondary | ICD-10-CM | POA: Diagnosis not present

## 2020-04-07 ENCOUNTER — Ambulatory Visit
Admission: RE | Admit: 2020-04-07 | Discharge: 2020-04-07 | Disposition: A | Discharge status: Home / Self Care | Payer: BC Managed Care – PPO | Source: Ambulatory Visit | Attending: Radiation Oncology | Admitting: Radiation Oncology

## 2020-04-07 ENCOUNTER — Other Ambulatory Visit: Payer: Self-pay

## 2020-04-07 DIAGNOSIS — D0512 Intraductal carcinoma in situ of left breast: Secondary | ICD-10-CM | POA: Diagnosis not present

## 2020-04-10 ENCOUNTER — Other Ambulatory Visit: Payer: Self-pay

## 2020-04-10 ENCOUNTER — Ambulatory Visit
Admission: RE | Admit: 2020-04-10 | Discharge: 2020-04-10 | Disposition: A | Discharge status: Home / Self Care | Payer: BC Managed Care – PPO | Source: Ambulatory Visit | Attending: Radiation Oncology | Admitting: Radiation Oncology

## 2020-04-10 DIAGNOSIS — D0512 Intraductal carcinoma in situ of left breast: Secondary | ICD-10-CM | POA: Diagnosis not present

## 2020-04-11 ENCOUNTER — Other Ambulatory Visit: Payer: Self-pay

## 2020-04-11 ENCOUNTER — Ambulatory Visit
Admission: RE | Admit: 2020-04-11 | Discharge: 2020-04-11 | Disposition: A | Discharge status: Home / Self Care | Payer: BC Managed Care – PPO | Source: Ambulatory Visit | Attending: Radiation Oncology | Admitting: Radiation Oncology

## 2020-04-11 DIAGNOSIS — D0512 Intraductal carcinoma in situ of left breast: Secondary | ICD-10-CM | POA: Diagnosis not present

## 2020-04-12 ENCOUNTER — Other Ambulatory Visit: Payer: Self-pay

## 2020-04-12 ENCOUNTER — Ambulatory Visit
Admission: RE | Admit: 2020-04-12 | Discharge: 2020-04-12 | Disposition: A | Discharge status: Home / Self Care | Payer: BC Managed Care – PPO | Source: Ambulatory Visit | Attending: Radiation Oncology | Admitting: Radiation Oncology

## 2020-04-12 DIAGNOSIS — D0512 Intraductal carcinoma in situ of left breast: Secondary | ICD-10-CM | POA: Diagnosis not present

## 2020-04-13 ENCOUNTER — Other Ambulatory Visit: Payer: Self-pay

## 2020-04-13 ENCOUNTER — Ambulatory Visit
Admission: RE | Admit: 2020-04-13 | Discharge: 2020-04-13 | Disposition: A | Discharge status: Home / Self Care | Payer: BC Managed Care – PPO | Source: Ambulatory Visit | Attending: Radiation Oncology | Admitting: Radiation Oncology

## 2020-04-13 DIAGNOSIS — D0512 Intraductal carcinoma in situ of left breast: Secondary | ICD-10-CM | POA: Diagnosis not present

## 2020-04-14 ENCOUNTER — Other Ambulatory Visit: Payer: Self-pay

## 2020-04-14 ENCOUNTER — Ambulatory Visit
Admission: RE | Admit: 2020-04-14 | Discharge: 2020-04-14 | Disposition: A | Discharge status: Home / Self Care | Payer: BC Managed Care – PPO | Source: Ambulatory Visit | Attending: Radiation Oncology | Admitting: Radiation Oncology

## 2020-04-14 DIAGNOSIS — D0512 Intraductal carcinoma in situ of left breast: Secondary | ICD-10-CM | POA: Diagnosis not present

## 2020-04-17 ENCOUNTER — Other Ambulatory Visit: Payer: Self-pay

## 2020-04-17 ENCOUNTER — Ambulatory Visit
Admission: RE | Admit: 2020-04-17 | Discharge: 2020-04-17 | Disposition: A | Discharge status: Home / Self Care | Payer: BC Managed Care – PPO | Source: Ambulatory Visit | Attending: Radiation Oncology | Admitting: Radiation Oncology

## 2020-04-17 DIAGNOSIS — D0512 Intraductal carcinoma in situ of left breast: Secondary | ICD-10-CM | POA: Diagnosis not present

## 2020-04-18 ENCOUNTER — Other Ambulatory Visit: Payer: Self-pay

## 2020-04-18 ENCOUNTER — Ambulatory Visit
Admission: RE | Admit: 2020-04-18 | Discharge: 2020-04-18 | Disposition: A | Discharge status: Home / Self Care | Payer: BC Managed Care – PPO | Source: Ambulatory Visit | Attending: Radiation Oncology | Admitting: Radiation Oncology

## 2020-04-18 DIAGNOSIS — D0512 Intraductal carcinoma in situ of left breast: Secondary | ICD-10-CM | POA: Diagnosis not present

## 2020-04-19 ENCOUNTER — Ambulatory Visit
Admission: RE | Admit: 2020-04-19 | Discharge: 2020-04-19 | Disposition: A | Discharge status: Home / Self Care | Payer: BC Managed Care – PPO | Source: Ambulatory Visit | Attending: Radiation Oncology | Admitting: Radiation Oncology

## 2020-04-19 ENCOUNTER — Other Ambulatory Visit: Payer: Self-pay

## 2020-04-19 DIAGNOSIS — D0512 Intraductal carcinoma in situ of left breast: Secondary | ICD-10-CM | POA: Diagnosis not present

## 2020-04-20 ENCOUNTER — Ambulatory Visit
Admission: RE | Admit: 2020-04-20 | Discharge: 2020-04-20 | Disposition: A | Discharge status: Home / Self Care | Payer: BC Managed Care – PPO | Source: Ambulatory Visit | Attending: Radiation Oncology | Admitting: Radiation Oncology

## 2020-04-20 ENCOUNTER — Other Ambulatory Visit: Payer: Self-pay

## 2020-04-20 DIAGNOSIS — D0512 Intraductal carcinoma in situ of left breast: Secondary | ICD-10-CM | POA: Diagnosis not present

## 2020-04-21 ENCOUNTER — Ambulatory Visit
Admission: RE | Admit: 2020-04-21 | Discharge: 2020-04-21 | Disposition: A | Discharge status: Home / Self Care | Payer: BC Managed Care – PPO | Source: Ambulatory Visit | Attending: Radiation Oncology | Admitting: Radiation Oncology

## 2020-04-21 ENCOUNTER — Other Ambulatory Visit: Payer: Self-pay

## 2020-04-21 DIAGNOSIS — D0512 Intraductal carcinoma in situ of left breast: Secondary | ICD-10-CM | POA: Diagnosis not present

## 2020-04-21 MED ORDER — SONAFINE EX EMUL
1.0000 "application " | Freq: Once | CUTANEOUS | Status: AC
  Administered 2020-04-21: 1 via TOPICAL

## 2020-04-25 ENCOUNTER — Ambulatory Visit
Admission: RE | Admit: 2020-04-25 | Discharge: 2020-04-25 | Disposition: A | Discharge status: Home / Self Care | Payer: BC Managed Care – PPO | Source: Ambulatory Visit | Attending: Radiation Oncology | Admitting: Radiation Oncology

## 2020-04-25 ENCOUNTER — Other Ambulatory Visit: Payer: Self-pay

## 2020-04-25 DIAGNOSIS — Z170 Estrogen receptor positive status [ER+]: Secondary | ICD-10-CM | POA: Insufficient documentation

## 2020-04-25 DIAGNOSIS — D0512 Intraductal carcinoma in situ of left breast: Secondary | ICD-10-CM | POA: Diagnosis not present

## 2020-04-25 DIAGNOSIS — Z510 Encounter for antineoplastic radiation therapy: Secondary | ICD-10-CM | POA: Insufficient documentation

## 2020-04-26 ENCOUNTER — Ambulatory Visit
Admission: RE | Admit: 2020-04-26 | Discharge: 2020-04-26 | Disposition: A | Discharge status: Home / Self Care | Payer: BC Managed Care – PPO | Source: Ambulatory Visit | Attending: Radiation Oncology | Admitting: Radiation Oncology

## 2020-04-26 ENCOUNTER — Ambulatory Visit: Payer: BC Managed Care – PPO

## 2020-04-26 ENCOUNTER — Other Ambulatory Visit: Payer: Self-pay

## 2020-04-26 DIAGNOSIS — D0512 Intraductal carcinoma in situ of left breast: Secondary | ICD-10-CM | POA: Diagnosis not present

## 2020-04-27 ENCOUNTER — Other Ambulatory Visit: Payer: Self-pay

## 2020-04-27 ENCOUNTER — Ambulatory Visit
Admission: RE | Admit: 2020-04-27 | Discharge: 2020-04-27 | Disposition: A | Discharge status: Home / Self Care | Payer: BC Managed Care – PPO | Source: Ambulatory Visit | Attending: Radiation Oncology | Admitting: Radiation Oncology

## 2020-04-27 DIAGNOSIS — D0512 Intraductal carcinoma in situ of left breast: Secondary | ICD-10-CM | POA: Diagnosis not present

## 2020-04-28 ENCOUNTER — Other Ambulatory Visit: Payer: Self-pay

## 2020-04-28 ENCOUNTER — Ambulatory Visit
Admission: RE | Admit: 2020-04-28 | Discharge: 2020-04-28 | Disposition: A | Discharge status: Home / Self Care | Payer: BC Managed Care – PPO | Source: Ambulatory Visit | Attending: Radiation Oncology | Admitting: Radiation Oncology

## 2020-04-28 DIAGNOSIS — D0512 Intraductal carcinoma in situ of left breast: Secondary | ICD-10-CM | POA: Diagnosis not present

## 2020-05-01 ENCOUNTER — Ambulatory Visit
Admission: RE | Admit: 2020-05-01 | Discharge: 2020-05-01 | Disposition: A | Discharge status: Home / Self Care | Payer: BC Managed Care – PPO | Source: Ambulatory Visit | Attending: Radiation Oncology | Admitting: Radiation Oncology

## 2020-05-01 ENCOUNTER — Other Ambulatory Visit: Payer: Self-pay

## 2020-05-01 DIAGNOSIS — D0512 Intraductal carcinoma in situ of left breast: Secondary | ICD-10-CM | POA: Diagnosis not present

## 2020-05-02 ENCOUNTER — Other Ambulatory Visit: Payer: Self-pay

## 2020-05-02 ENCOUNTER — Ambulatory Visit
Admission: RE | Admit: 2020-05-02 | Discharge: 2020-05-02 | Disposition: A | Discharge status: Home / Self Care | Payer: BC Managed Care – PPO | Source: Ambulatory Visit | Attending: Radiation Oncology | Admitting: Radiation Oncology

## 2020-05-02 DIAGNOSIS — D0512 Intraductal carcinoma in situ of left breast: Secondary | ICD-10-CM | POA: Diagnosis not present

## 2020-05-03 ENCOUNTER — Other Ambulatory Visit: Payer: Self-pay

## 2020-05-03 ENCOUNTER — Ambulatory Visit
Admission: RE | Admit: 2020-05-03 | Discharge: 2020-05-03 | Disposition: A | Discharge status: Home / Self Care | Payer: BC Managed Care – PPO | Source: Ambulatory Visit | Attending: Radiation Oncology | Admitting: Radiation Oncology

## 2020-05-03 DIAGNOSIS — D0512 Intraductal carcinoma in situ of left breast: Secondary | ICD-10-CM | POA: Diagnosis not present

## 2020-05-04 ENCOUNTER — Ambulatory Visit: Payer: BC Managed Care – PPO

## 2020-05-04 ENCOUNTER — Encounter: Payer: Self-pay | Admitting: *Deleted

## 2020-05-04 ENCOUNTER — Encounter: Payer: Self-pay | Admitting: Radiation Oncology

## 2020-05-04 ENCOUNTER — Other Ambulatory Visit: Payer: Self-pay

## 2020-05-04 ENCOUNTER — Ambulatory Visit
Admission: RE | Admit: 2020-05-04 | Discharge: 2020-05-04 | Disposition: A | Discharge status: Home / Self Care | Payer: BC Managed Care – PPO | Source: Ambulatory Visit | Attending: Radiation Oncology | Admitting: Radiation Oncology

## 2020-05-04 ENCOUNTER — Other Ambulatory Visit: Payer: Self-pay | Admitting: Oncology

## 2020-05-04 DIAGNOSIS — D0512 Intraductal carcinoma in situ of left breast: Secondary | ICD-10-CM | POA: Diagnosis not present

## 2020-05-04 MED ORDER — TAMOXIFEN CITRATE 20 MG PO TABS: 20.0000 mg | ORAL_TABLET | Freq: Every day | ORAL | 12 refills | Status: AC

## 2020-05-04 MED ORDER — ESTRADIOL 0.1 MG/GM VA CREA: 1.0000 | TOPICAL_CREAM | Freq: Every day | VAGINAL | 12 refills | Status: DC

## 2020-05-05 ENCOUNTER — Ambulatory Visit: Payer: BC Managed Care – PPO

## 2020-05-28 NOTE — Progress Notes (Signed)
  Radiation Oncology         [phone] ________________________________  Name: Sherri Schultz MRN: [id number]  Date: 05/04/2020  DOB: August 29, 1966  End of Treatment Note  Diagnosis:   left-sided breast cancer     Indication for treatment:  Curative       Radiation treatment dates:   03/20/20 - 05/04/20  Site/dose:   The patient initially received a dose of 50.4 Gy in 28 fractions to the breast using whole-breast tangent fields. This was delivered using a 3-D conformal technique. The patient then received a boost to the seroma. This delivered an additional 10 Gy in 5 fractions using a 3-field photon boost technique. The total dose was 60.4 Gy.  Narrative: The patient tolerated radiation treatment relatively well.   The patient had some expected skin irritation as she progressed during treatment.    Plan: The patient has completed radiation treatment. The patient will return to radiation oncology clinic for routine followup in one month. I advised the patient to call or return sooner if they have any questions or concerns related to their recovery or treatment. ________________________________  Jodelle Gross, M.D., Ph.D.

## 2020-06-25 ENCOUNTER — Encounter: Payer: Self-pay | Admitting: *Deleted

## 2020-06-29 ENCOUNTER — Other Ambulatory Visit: Payer: Self-pay | Admitting: Oncology

## 2020-06-29 DIAGNOSIS — C4371 Malignant melanoma of right lower limb, including hip: Secondary | ICD-10-CM

## 2020-07-03 ENCOUNTER — Other Ambulatory Visit: Payer: Self-pay | Admitting: Oncology

## 2020-07-11 ENCOUNTER — Inpatient Hospital Stay: Payer: BC Managed Care – PPO | Admitting: Oncology

## 2020-08-10 NOTE — Progress Notes (Signed)
Sherri Schultz  [phone] [phone]     ID: Sherri Schultz DOB: 29-Aug-1966  MR#: [id number]  WEX#:[id number]  Patient Care Team: Vicenta Aly, [place] as PCP - General (Nurse Practitioner) Rockwell Germany, RN as Oncology Nurse Navigator Mauro Kaufmann, RN as Oncology Nurse Navigator Stark Klein, MD as Consulting Physician (General Surgery) Dewell Monnier, Virgie Dad, MD as Consulting Physician (Oncology) Kyung Rudd, MD as Consulting Physician (Radiation Oncology) Leandrew Koyanagi, MD as Attending Physician (Orthopedic Surgery) Chauncey Cruel, MD OTHER MD:  I was unable to connect with Sherri Schultz on 08/12/20 at  9:30 AM EDT by video enabled telemedicine visit.     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 Lisbeth Renshaw.  She started tamoxifen on 05/29/2020.   REVIEW OF SYSTEMS: Sherri Schultz    HISTORY OF CURRENT ILLNESS: From the original intake note:  Sherri Schultz had routine screening mammography on 12/01/2019 showing a possible abnormalities in the left breast. She underwent left diagnostic mammography with tomography and left breast ultrasonography at [facility] on 12/08/2019 showing: breast density category C; suspicious 1.5 cm mass involving the lower-inner quadrant of the left breast at 8:30, with a hyperechoic halo surrounding the mass for a total span of 1.8 cm; indeterminate 0.4 cm mass in the upper-inner quadrant of the left breast at 10 o'clock; no pathologic left axillary lymphadenopathy; benign cyst in the upper-outer subareolar left breast.  Accordingly on 12/14/2019 she proceeded to biopsy of the left breast areas in question. The pathology from this procedure ([id number]) showed:  1. Left breast, 8:30   - ductal carcinoma in situ, grade 3, with apocrine features (0.6 cm on biopsy)  -  estrogen receptor, 0% negative and progesterone receptor, 0% negative.  2. Left breast, 10 o'clock  - fibroadenomatoid nodule with columnar cells changes  The patient's subsequent history is as detailed below.   PAST MEDICAL HISTORY: Past Medical History:  Diagnosis Date  . Anxiety   . Breast cancer ([place]) 2021   left breast DCIS  . Cancer [facility]) 2013   right knee  . Family history of brain cancer   . Family history of breast cancer   . Family history of colon cancer   . Family history of melanoma   . Hypertension   . Trimalleolar fracture of ankle, closed, left, initial encounter     PAST SURGICAL HISTORY: Past Surgical History:  Procedure Laterality Date  . BREAST BIOPSY Right 10+ yrs ago   benign  . BREAST LUMPECTOMY WITH RADIOACTIVE SEED LOCALIZATION Left 02/15/2020   Procedure: LEFT BREAST LUMPECTOMY WITH RADIOACTIVE SEED LOCALIZATION;  Surgeon: Stark Klein, MD;  Location: [place];  Service: General;  Laterality: Left;  Marland Kitchen MELANOMA EXCISION Right 2013   knee  . ORIF ANKLE FRACTURE Left 05/06/2018   Procedure: OPEN REDUCTION INTERNAL FIXATION (ORIF) LEFT TRIMALLEOLAR ANKLE FRACTURE;  Surgeon: Leandrew Koyanagi, MD;  Location: [place];  Service: Orthopedics;  Laterality: Left;  . RADIOACTIVE SEED GUIDED EXCISIONAL BREAST BIOPSY Right 02/15/2020   Procedure: RIGHT BREAST RADIOACTIVE SEED LOCALIZATION EXCISIONAL BIOPSY X 2;  Surgeon: Stark Klein, MD;  Location: [place];  Service: General;  Laterality: Right;    FAMILY HISTORY: Family History  Problem Relation Age of Onset  . Breast cancer Mother 37  . Diabetes Mother   . Colon cancer  Mother 53  . Breast cancer Maternal Aunt        dx. >50  . Diabetes Father   . Hypertension Father   . Heart attack Father   . Arthritis Father   . Diabetes Brother   . Melanoma Brother 65  . Diabetes Brother   . Breast cancer Cousin        dx. 40s/50s  . Brain cancer Maternal Uncle          dx. 80s  . Breast cancer Maternal Aunt        dx. >50  . Breast cancer Maternal Aunt        dx. >50  . Cancer Maternal Uncle        unknown type, dx. >50  . Breast cancer Cousin        dx. 40s/50s   The patient's father died at age 84 from congestive heart failure.  Patient's mother died from breast cancer at age 64, diagnosed age 33.. The patient denies a family hx of ovarian cancer. She reports breast cancer in 2 maternal aunts and 2 maternal first cousins.  The patient has 2 brothers with no history of cancer.   GYNECOLOGIC HISTORY:  Patient's last menstrual period was 03/26/2016. Menarche: 54 years old [place] P 0 LMP 2017 HRT no  Hysterectomy? no BSO? 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  They mostly have trees in their [place] acres.  Her husband Sherri Schultz (the patient kept her name on her insurance) used to work as a Clinical cytogeneticist for [employer] but is now also retired.  They have no children and no pets.  The patient attends a local [employer] DIRECTIVES: In the absence of any documents to the contrary the patient's husband is her healthcare power of attorney   HEALTH MAINTENANCE: Social History   Tobacco Use  . Smoking status: Never Smoker  . Smokeless tobacco: Never Used  Substance Use Topics  . Alcohol use: Never  . Drug use: Never     Colonoscopy: Never  PAP: 04/2016, negative  Bone density: Never   No Known Allergies  Current Outpatient Medications  Medication Sig Dispense Refill  . calcium-vitamin D (OSCAL WITH D) 500-200 MG-UNIT tablet Take 1 tablet by mouth 3 (three) times daily. 90 tablet 12  . estradiol (ESTRACE VAGINAL) 0.1 MG/GM vaginal cream Place 1 Applicatorful vaginally at bedtime. 42.5 g 12  . meclizine (ANTIVERT) 25 MG tablet Take 1 tablet by mouth 3 (three) times daily as needed.    . propranolol (INDERAL) 40 MG tablet Take 40 mg by mouth daily.     . valACYclovir (VALTREX) 1000 MG tablet Take 2 tablets by mouth 2 (two) times daily as needed.    . venlafaxine XR (EFFEXOR-XR) 150 MG 24 hr capsule Take 1 capsule by mouth daily.  0  . zinc sulfate 220 (50 Zn) MG capsule Take 1 capsule (220 mg total) by mouth daily. 42 capsule 0   No current facility-administered medications for this visit.    OBJECTIVE:   There were no vitals filed for this visit.   There is no height or weight on file to calculate BMI.   Wt Readings from Last 3 Encounters:  03/22/20 163 lb 14.4 oz (74.3 kg)  02/15/20 162 lb 11.2 oz (73.8 kg)  12/22/19 159 lb 14.4 oz (72.5 kg)      ECOG  FS:    LAB RESULTS:  CMP     Component Value Date/Time   NA 142 12/22/2019 0827   K 3.7 12/22/2019 0827   CL 110 12/22/2019 0827   CO2 24 12/22/2019 0827   GLUCOSE 101 (H) 12/22/2019 0827   BUN 7 12/22/2019 0827   CREATININE 0.90 12/22/2019 0827   CALCIUM 8.7 (L) 12/22/2019 0827   PROT 7.1 12/22/2019 0827   ALBUMIN 3.9 12/22/2019 0827   AST 18 12/22/2019 0827   ALT 16 12/22/2019 0827   ALKPHOS 100 12/22/2019 0827   BILITOT 0.3 12/22/2019 0827   GFRNONAA >60 12/22/2019 0827   GFRAA >60 12/22/2019 0827    No results found for: TOTALPROTELP, ALBUMINELP, A1GS, A2GS, BETS, BETA2SER, GAMS, MSPIKE, SPEI  Lab Results  Component Value Date   WBC 4.5 12/22/2019   NEUTROABS 2.1 12/22/2019   HGB 12.4 12/22/2019   HCT 37.6 12/22/2019   MCV 93.1 12/22/2019   PLT 298 12/22/2019    No results found for: LABCA2  No components found for: AOZHYQ657  No results for input(s): INR in the last 168 hours.  No results found for: LABCA2  No results found for: QIO962  No results found for: XBM841  No results found for: LKG401  No results found for: CA2729  No components found for: HGQUANT  No results found for: CEA1 / No results found for: CEA1   No results found for: AFPTUMOR  No results found for: CHROMOGRNA  No results found for: KPAFRELGTCHN, LAMBDASER,  KAPLAMBRATIO (kappa/lambda light chains)  No results found for: HGBA, HGBA2QUANT, HGBFQUANT, HGBSQUAN (Hemoglobinopathy evaluation)   No results found for: LDH  No results found for: IRON, TIBC, IRONPCTSAT (Iron and TIBC)  No results found for: FERRITIN  Urinalysis No results found for: COLORURINE, APPEARANCEUR, LABSPEC, PHURINE, GLUCOSEU, HGBUR, BILIRUBINUR, KETONESUR, PROTEINUR, UROBILINOGEN, NITRITE, LEUKOCYTESUR   STUDIES: No results found.   ELIGIBLE FOR AVAILABLE RESEARCH PROTOCOL: No  ASSESSMENT: 54 y.o. [place] woman status post left breast biopsy 12/14/2019 for ductal carcinoma in situ, clinically 1.8 cm, grade 3, estrogen and progesterone receptor negative  (1) genetics testing 01/01/2020 through the STAT Breast cancer panel offered by [facility] found no deleterious mutations in  ATM, BRCA1, BRCA2, CDH1, CHEK2, PALB2, PTEN, STK11 and TP53.  The Multi-Cancer Panel offered by [facility] includes also found no deleterious mutations inAIP, ALK, APC, ATM, AXIN2,BAP1,  BARD1, BLM, BMPR1A, BRCA1, BRCA2, BRIP1, CASR, CDC73, CDH1, CDK4, CDKN1B, CDKN1C, CDKN2A (p14ARF), CDKN2A (p16INK4a), CEBPA, CHEK2, CTNNA1, DICER1, DIS3L2, EGFR (c.2369C>T, p.Thr790Met variant only), EPCAM (Deletion/duplication testing only), FH, FLCN, GATA2, GPC3, GREM1 (Promoter region deletion/duplication testing only), HOXB13 (c.251G>A, p.Gly84Glu), HRAS, KIT, MAX, MEN1, MET, MITF (c.952G>A, p.Glu318Lys variant only), MLH1, MSH2, MSH3, MSH6, MUTYH, NBN, NF1, NF2, NTHL1, PALB2, PDGFRA, PHOX2B, PMS2, POLD1, POLE, POT1, PRKAR1A, PTCH1, PTEN, RAD50, RAD51C, RAD51D, RB1, RECQL4, RET, RNF43, RUNX1, SDHAF2, SDHA (sequence changes only), SDHB, SDHC, SDHD, SMAD4, SMARCA4, SMARCB1, SMARCE1, STK11, SUFU, TERC, TERT, TMEM127, TP53, TSC1, TSC2, VHL, WRN and WT1.     (a) a variant of  uncertain significance was detected in the MSH3 gene called c.230_232dup (p.Pro77dup).   (2) status post bilateral lumpectomies 02/15/2020,  showing  (a) on the right, no evidence of malignancy (radial scar, fibroadenoma).  (b) on the left, ductal carcinoma in situ, grade 3, measuring 1.4 cm, with negative margins  (3) adjuvant radiation 03/20/2020 - 05/04/2020  (a) left breast / 50.4 Gy in 28 fractions  (b) seroma boost / 10 Gy in 5 fractions  (4) started  tamoxifen 05/29/2020   PLAN: Televisit 08/11/2020 has been rescheduled for 09/11/2020   Chauncey Cruel, MD   08/12/2020 8:59 AM Medical Oncology and Hematology [facility], [place] [address] Tel. [phone]    Fax. [phone]   This document serves as a record of services personally performed by Lurline Del, MD. It was created on his behalf by Wilburn Mylar, a trained medical scribe. The creation of this record is based on the scribe's personal observations and the provider's statements to them.   I, Lurline Del MD, have reviewed the above documentation for accuracy and completeness, and I agree with the above.   *Total Encounter Time as defined by the Centers for Medicare and Medicaid Services includes, in addition to the face-to-face time of a patient visit (documented in the note above) non-face-to-face time: obtaining and reviewing outside history, ordering and reviewing medications, tests or procedures, care coordination (communications with other health care professionals or caregivers) and documentation in the medical record.

## 2020-08-11 ENCOUNTER — Inpatient Hospital Stay: Payer: BC Managed Care – PPO | Attending: Oncology | Admitting: Oncology

## 2020-08-11 DIAGNOSIS — D0512 Intraductal carcinoma in situ of left breast: Secondary | ICD-10-CM

## 2020-08-11 DIAGNOSIS — C4371 Malignant melanoma of right lower limb, including hip: Secondary | ICD-10-CM

## 2020-09-07 ENCOUNTER — Ambulatory Visit
Admission: RE | Admit: 2020-09-07 | Discharge: 2020-09-07 | Disposition: A | Discharge status: Home / Self Care | Payer: BC Managed Care – PPO | Source: Ambulatory Visit | Attending: Oncology | Admitting: Oncology

## 2020-09-07 ENCOUNTER — Other Ambulatory Visit: Payer: Self-pay

## 2020-09-07 ENCOUNTER — Other Ambulatory Visit: Payer: Self-pay | Admitting: Oncology

## 2020-09-07 DIAGNOSIS — N644 Mastodynia: Secondary | ICD-10-CM

## 2020-09-07 DIAGNOSIS — C4371 Malignant melanoma of right lower limb, including hip: Secondary | ICD-10-CM

## 2020-09-07 DIAGNOSIS — D0512 Intraductal carcinoma in situ of left breast: Secondary | ICD-10-CM

## 2020-09-07 IMAGING — MG MM DIGITAL DIAGNOSTIC UNILAT*L* W/ TOMO W/ CAD
4 series · 4 of 12 positions shown · non-contrast
Comparison: Previous exams.

CLINICAL DATA: 54-year-old female with left breast DCIS post
lumpectomy [DATE] followed by radiation therapy. Patient reports
tenderness involving the upper-outer quadrant of the left breast.

EXAM:
DIGITAL DIAGNOSTIC UNILATERAL LEFT MAMMOGRAM WITH TOMO AND CAD;
ULTRASOUND LEFT BREAST LIMITED

[L CC synth-2D]
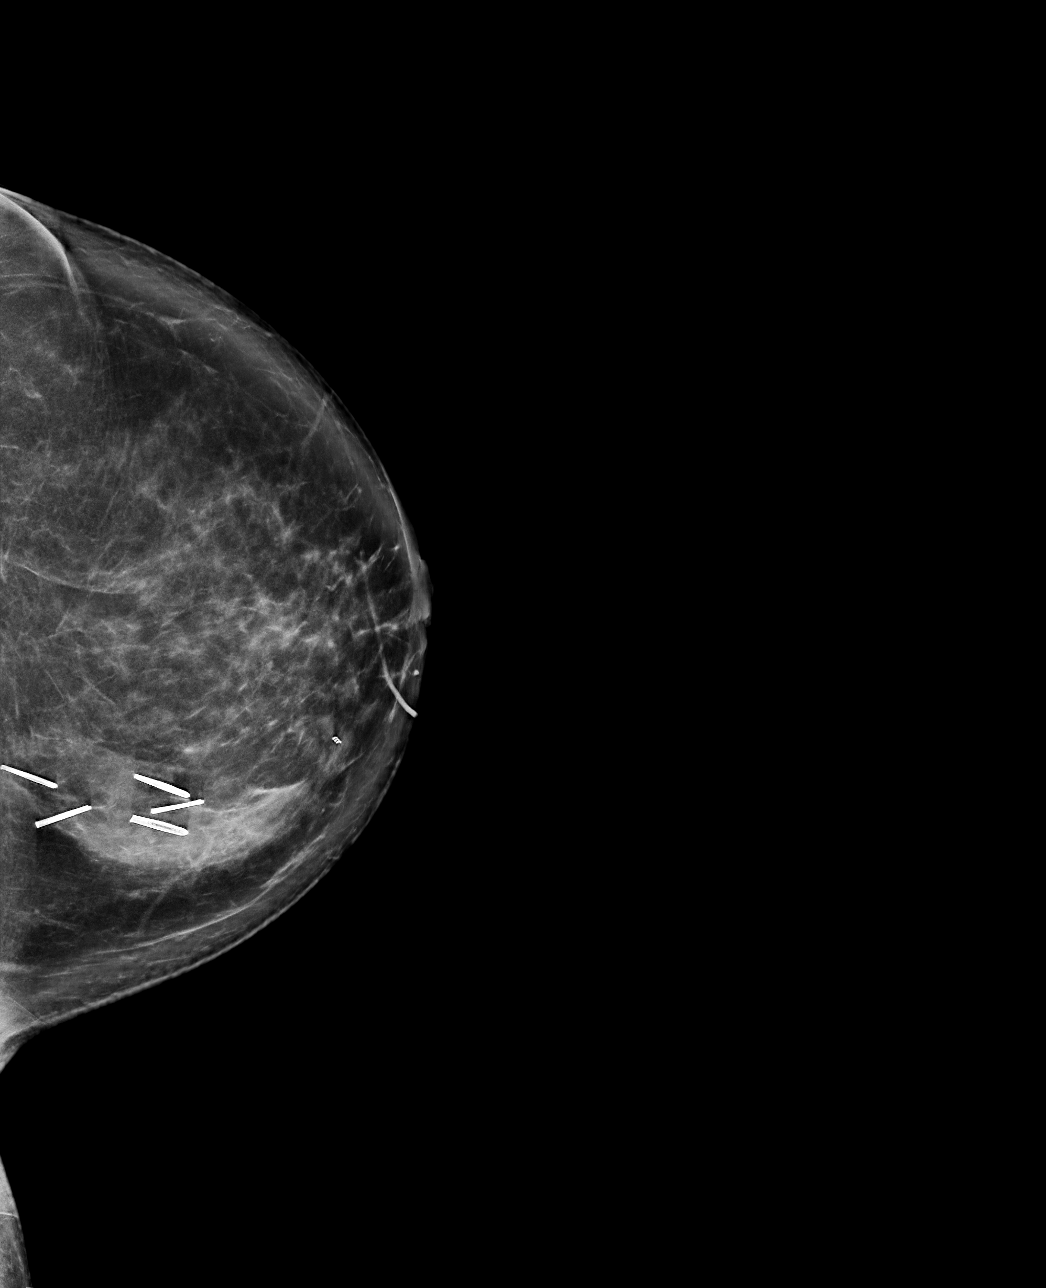

[L MLO synth-2D]
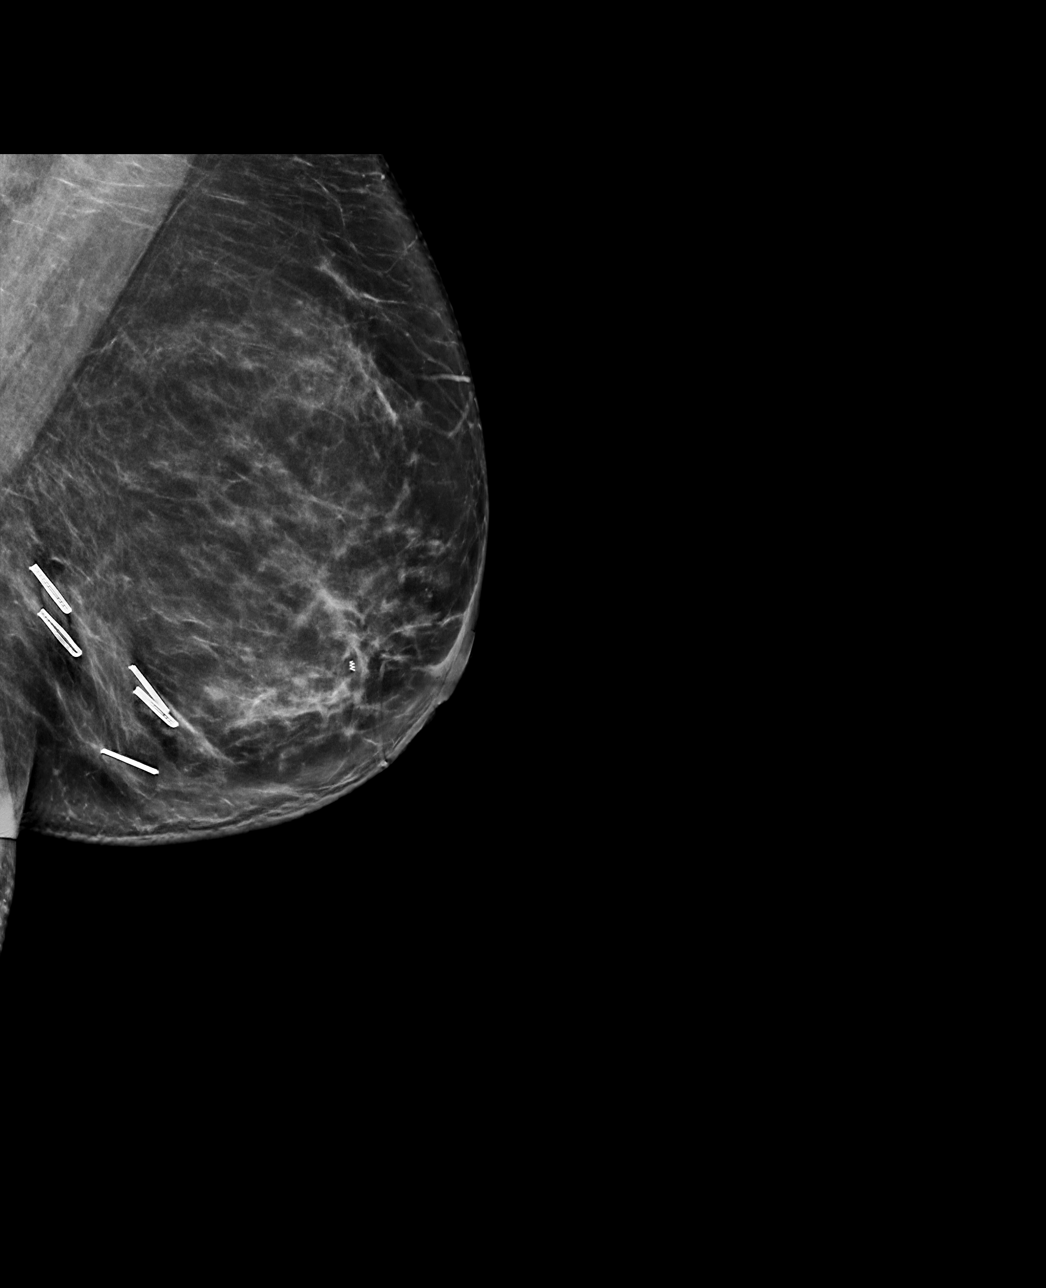

[L CC tomo · tomo slice 47/92.0]
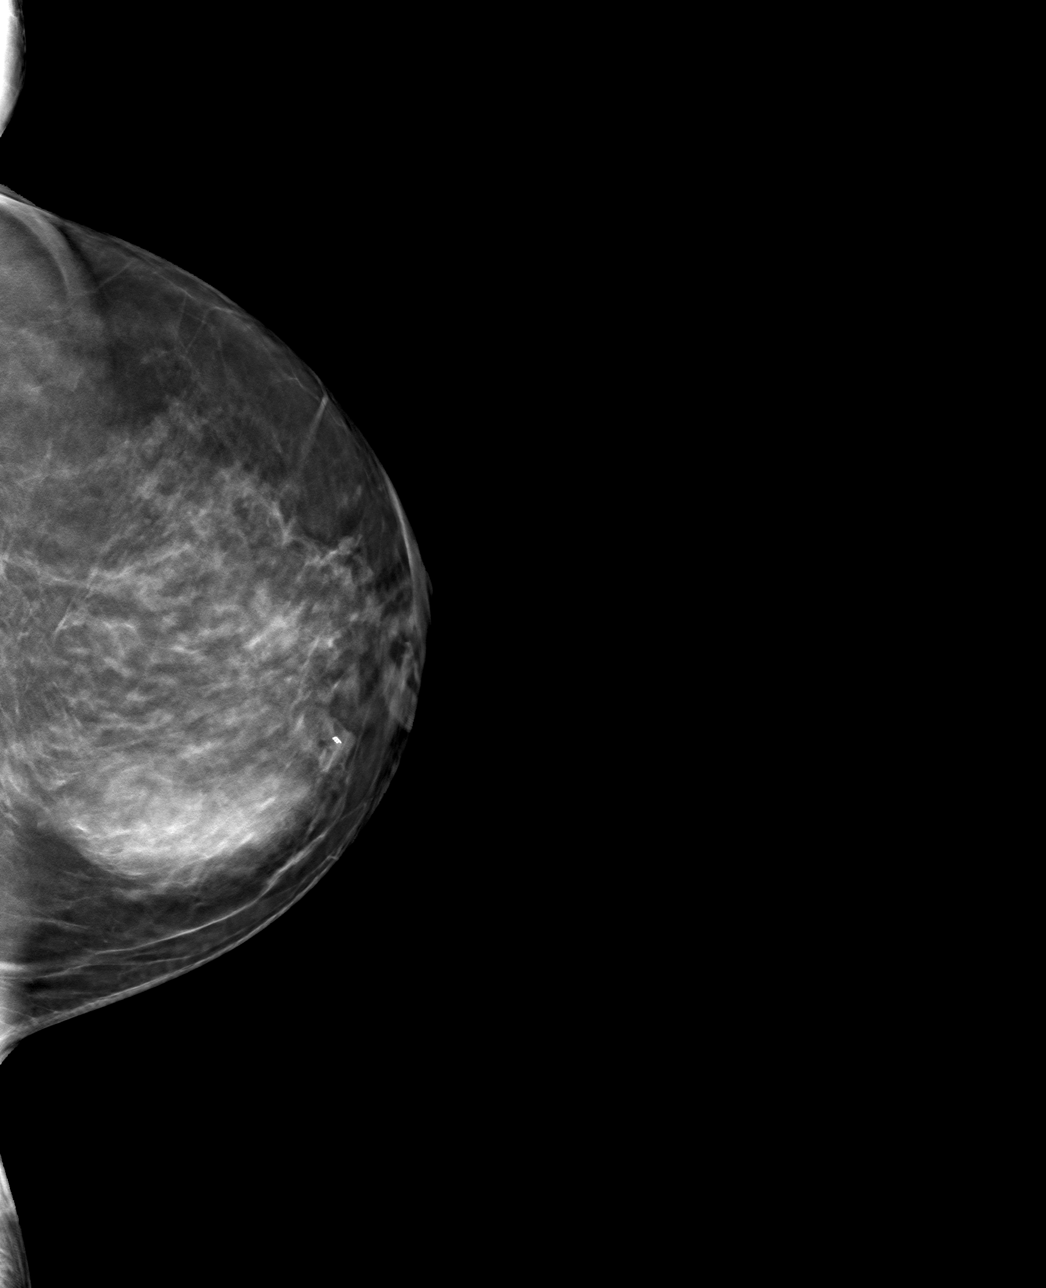

[L MLO tomo · tomo slice 45/88.0]
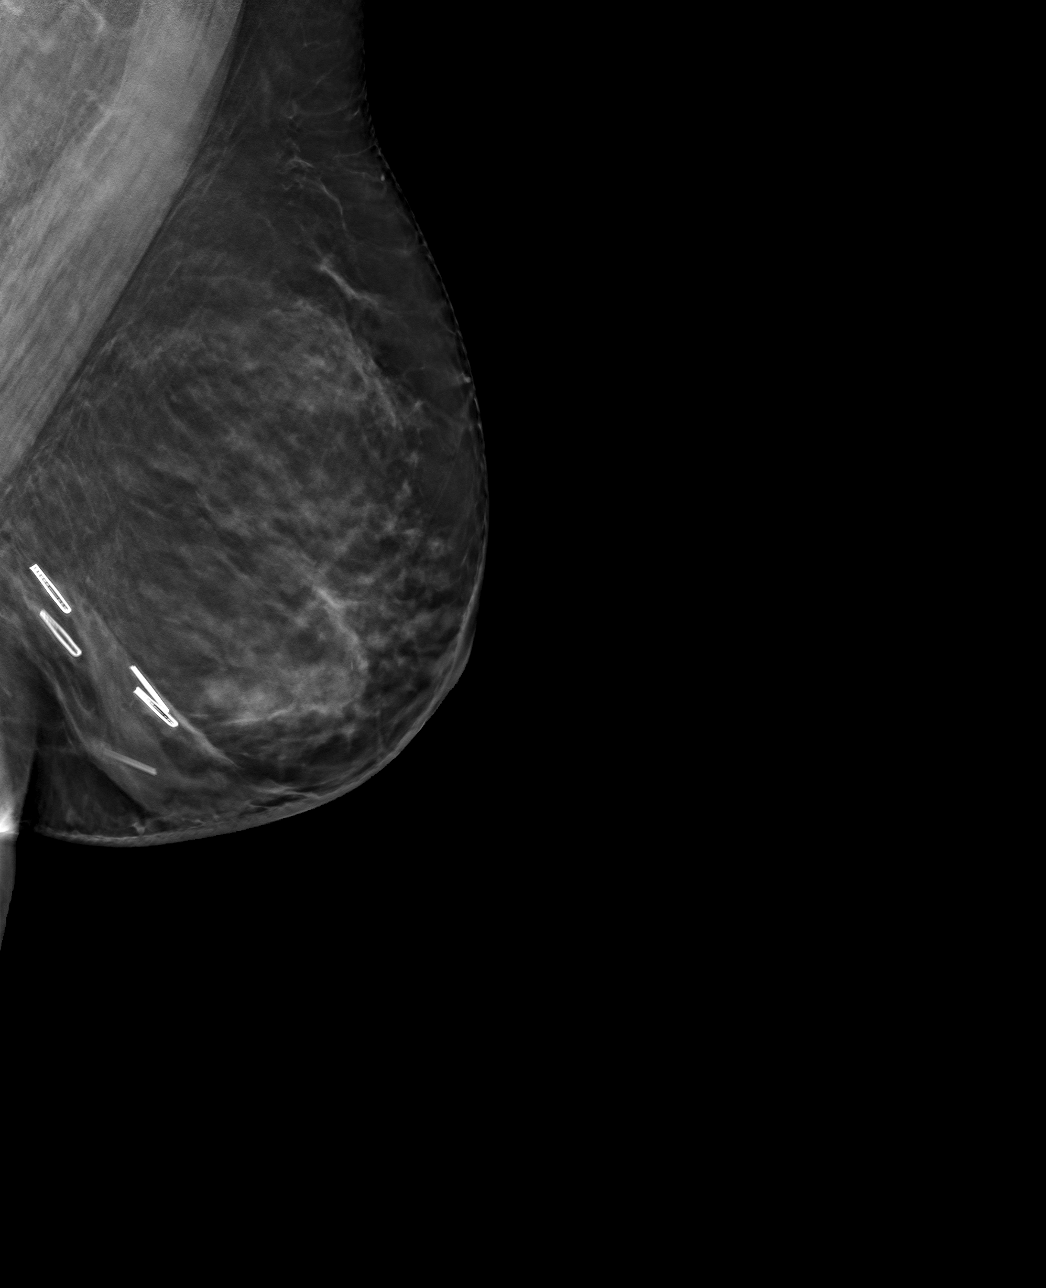

[4 of 12 positions shown; findings below may reference images not displayed]

ACR Breast Density Category b: There are scattered areas of
fibroglandular density.
FINDINGS: No suspicious masses or calcifications are seen in the left breast.
New lumpectomy changes are present in the lower inner left breast.
There is no mammographic evidence of malignancy in the left breast.

Mammographic images were processed with CAD.

Physical examination of the upper-outer left breast reveals
tenderness with deep palpation at the approximate 2 to 4 o'clock
position.

Targeted ultrasound of the left breast was performed. No suspicious
masses or abnormality seen, only heterogeneous fibroglandular tissue
identified.
IMPRESSION: 1. No mammographic or sonographic abnormalities to account for
tenderness involving the upper-outer quadrant of the left breast.

2. New lumpectomy changes in the left breast. No mammographic
evidence of malignancy.

RECOMMENDATION:
Bilateral diagnostic mammography [DATE].

I have discussed the findings and recommendations with the patient.
If applicable, a reminder letter will be sent to the patient
regarding the next appointment.

BI-RADS CATEGORY  2: Benign.

## 2020-09-07 IMAGING — US US BREAST*L* LIMITED INC AXILLA
1 series · 6 of 6 positions shown · non-contrast
Comparison: Previous exams.

CLINICAL DATA: 54-year-old female with left breast DCIS post
lumpectomy [DATE] followed by radiation therapy. Patient reports
tenderness involving the upper-outer quadrant of the left breast.

EXAM:
DIGITAL DIAGNOSTIC UNILATERAL LEFT MAMMOGRAM WITH TOMO AND CAD;
ULTRASOUND LEFT BREAST LIMITED

[Series 1: us breast*left* limited inc axilla · 0.07mm/px · 6 of 6 slices shown]
[im 1/6]
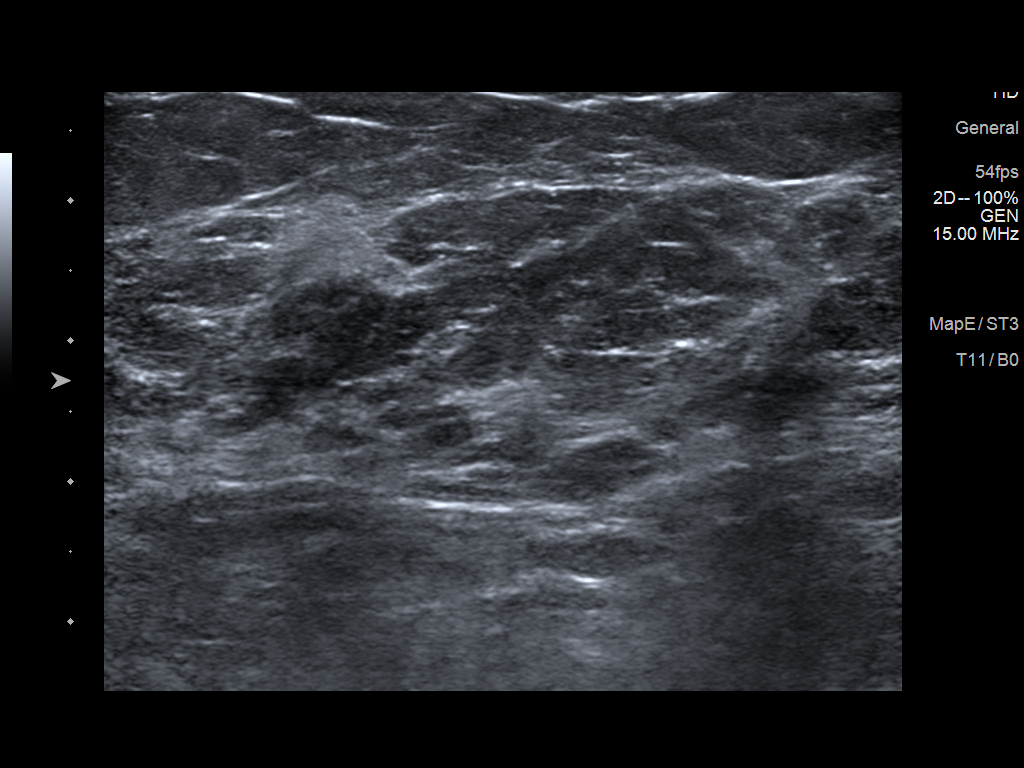
[im 2/6]
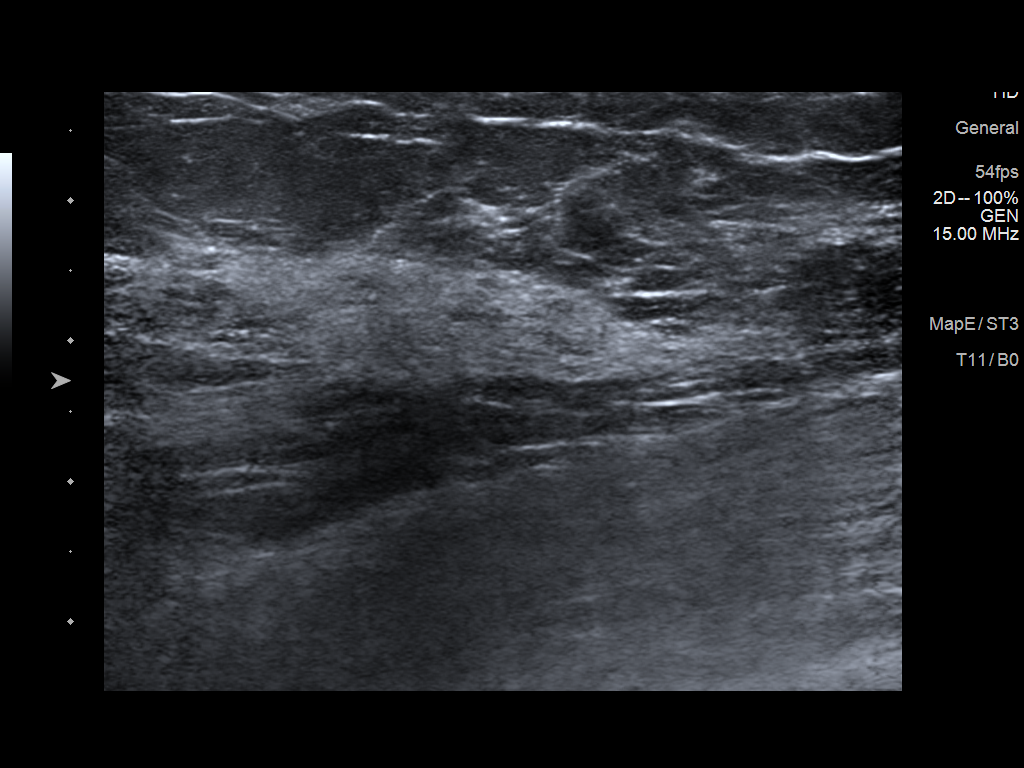
[im 3/6]
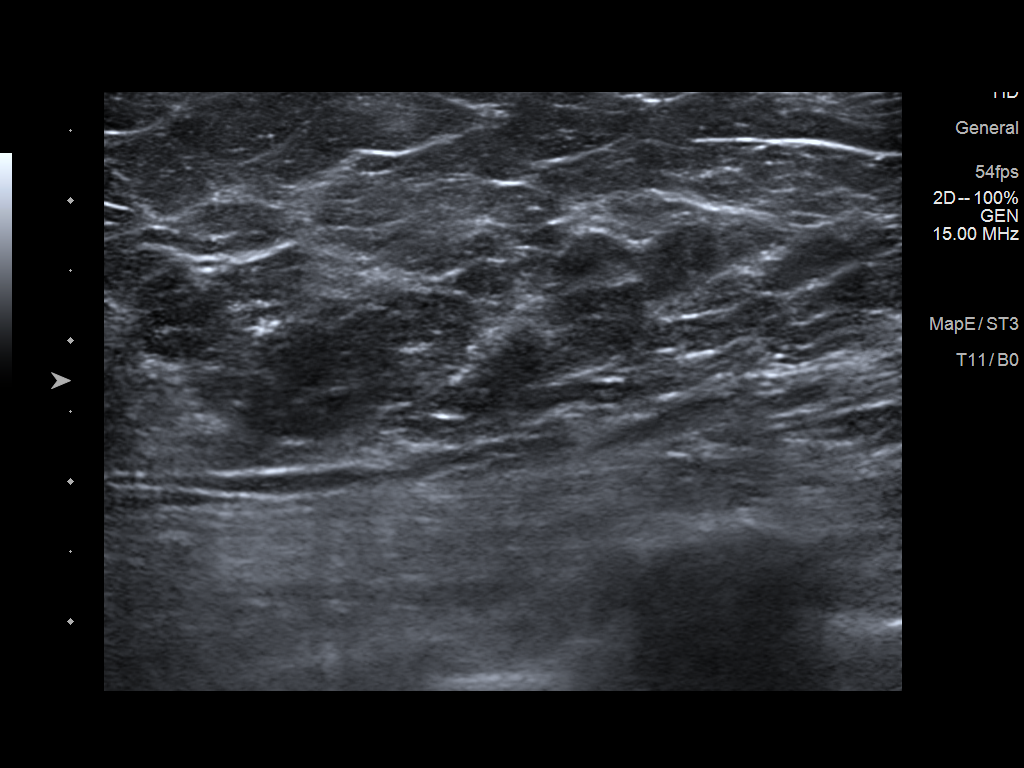
[im 4/6]
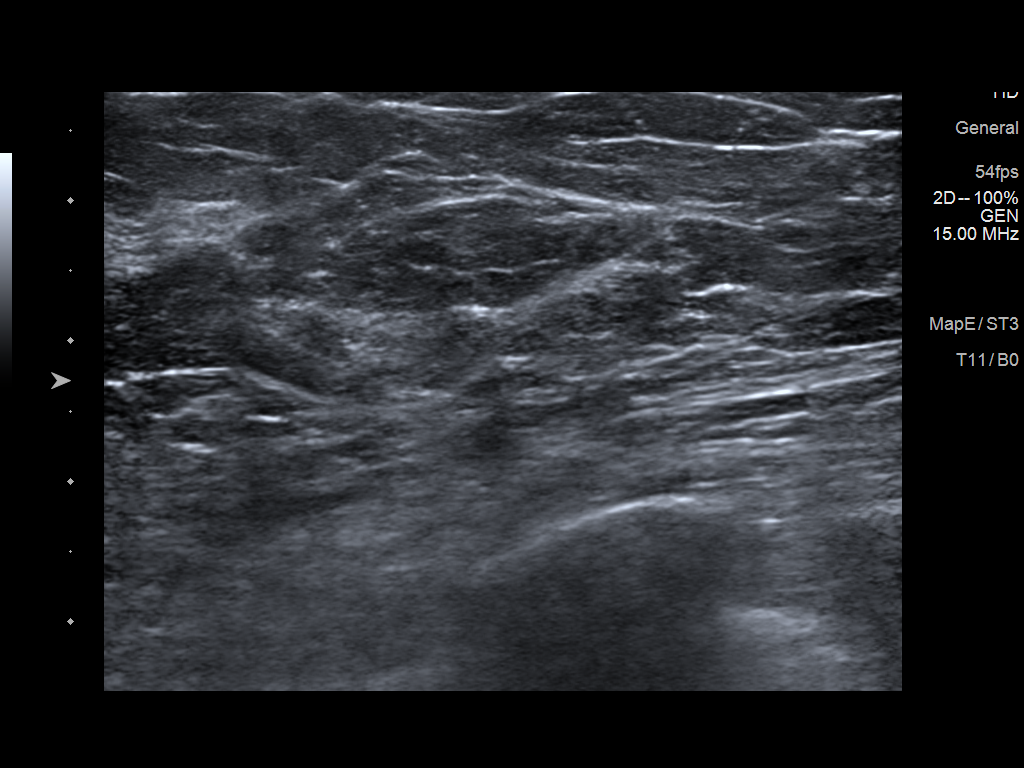
[im 5/6]
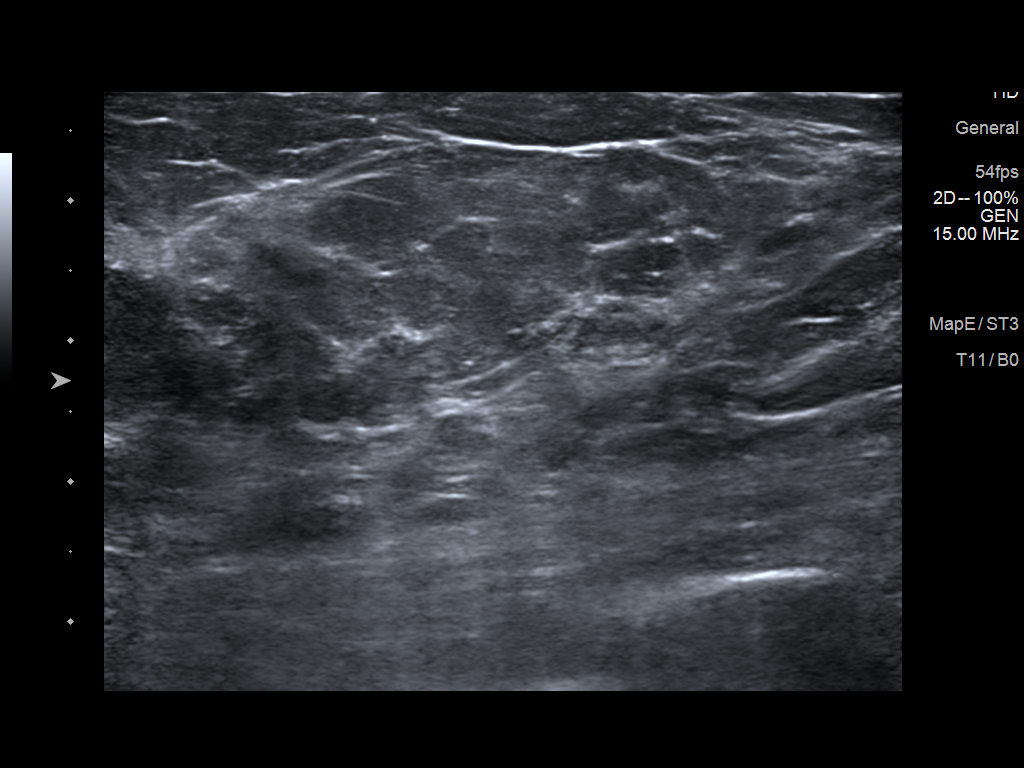
[im 6/6]
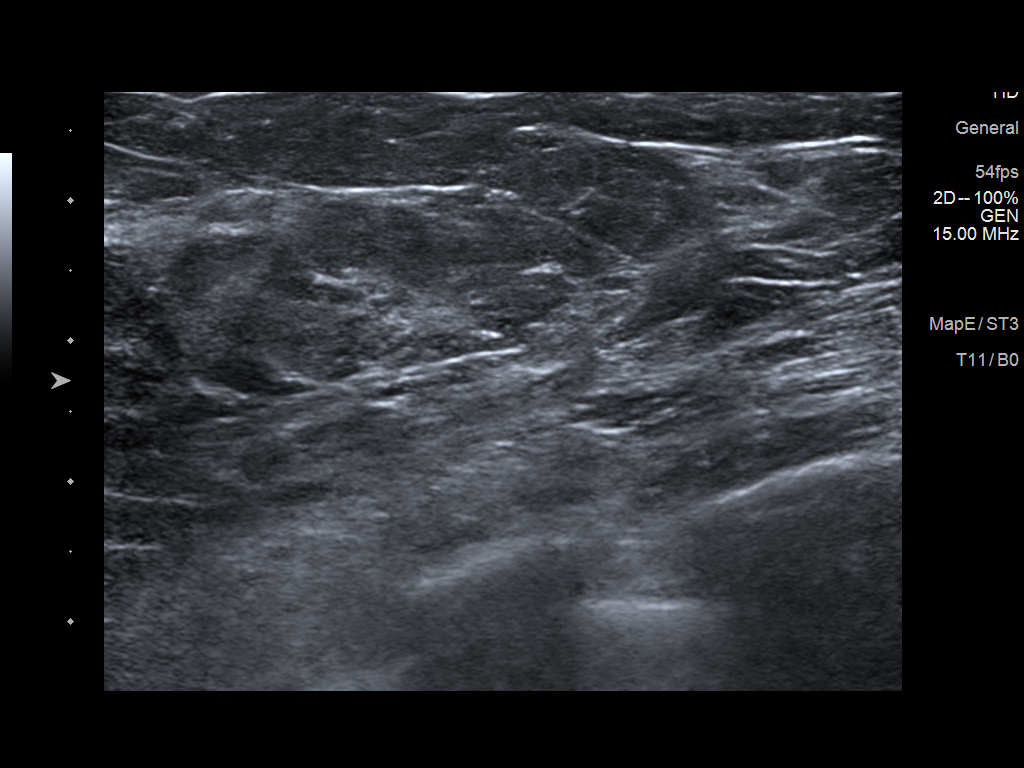

[6 of 6 positions shown; findings below may reference images not displayed]

ACR Breast Density Category b: There are scattered areas of
fibroglandular density.
FINDINGS: No suspicious masses or calcifications are seen in the left breast.
New lumpectomy changes are present in the lower inner left breast.
There is no mammographic evidence of malignancy in the left breast.

Mammographic images were processed with CAD.

Physical examination of the upper-outer left breast reveals
tenderness with deep palpation at the approximate 2 to 4 o'clock
position.

Targeted ultrasound of the left breast was performed. No suspicious
masses or abnormality seen, only heterogeneous fibroglandular tissue
identified.
IMPRESSION: 1. No mammographic or sonographic abnormalities to account for
tenderness involving the upper-outer quadrant of the left breast.

2. New lumpectomy changes in the left breast. No mammographic
evidence of malignancy.

RECOMMENDATION:
Bilateral diagnostic mammography [DATE].

I have discussed the findings and recommendations with the patient.
If applicable, a reminder letter will be sent to the patient
regarding the next appointment.

BI-RADS CATEGORY  2: Benign.

## 2020-09-11 ENCOUNTER — Inpatient Hospital Stay: Payer: BC Managed Care – PPO

## 2020-09-11 ENCOUNTER — Other Ambulatory Visit: Payer: Self-pay | Admitting: Oncology

## 2020-09-11 ENCOUNTER — Inpatient Hospital Stay: Payer: BC Managed Care – PPO | Admitting: Oncology

## 2020-09-11 NOTE — Progress Notes (Unsigned)
chal

## 2020-09-15 ENCOUNTER — Other Ambulatory Visit: Payer: Self-pay

## 2020-09-15 ENCOUNTER — Inpatient Hospital Stay: Payer: BC Managed Care – PPO | Attending: Oncology

## 2020-09-15 ENCOUNTER — Inpatient Hospital Stay (HOSPITAL_BASED_OUTPATIENT_CLINIC_OR_DEPARTMENT_OTHER): Payer: BC Managed Care – PPO | Admitting: Oncology

## 2020-09-15 VITALS — BP 127/76 | HR 76 | Temp 98.4°F | Resp 19 | Ht 64.0 in | Wt 170.4 lb

## 2020-09-15 DIAGNOSIS — C4371 Malignant melanoma of right lower limb, including hip: Secondary | ICD-10-CM

## 2020-09-15 DIAGNOSIS — Z808 Family history of malignant neoplasm of other organs or systems: Secondary | ICD-10-CM | POA: Insufficient documentation

## 2020-09-15 DIAGNOSIS — Z79810 Long term (current) use of selective estrogen receptor modulators (SERMs): Secondary | ICD-10-CM | POA: Diagnosis not present

## 2020-09-15 DIAGNOSIS — Z170 Estrogen receptor positive status [ER+]: Secondary | ICD-10-CM | POA: Diagnosis not present

## 2020-09-15 DIAGNOSIS — Z800 Family history of malignant neoplasm of digestive organs: Secondary | ICD-10-CM | POA: Insufficient documentation

## 2020-09-15 DIAGNOSIS — D0512 Intraductal carcinoma in situ of left breast: Secondary | ICD-10-CM

## 2020-09-15 DIAGNOSIS — Z79899 Other long term (current) drug therapy: Secondary | ICD-10-CM | POA: Diagnosis not present

## 2020-09-15 DIAGNOSIS — Z803 Family history of malignant neoplasm of breast: Secondary | ICD-10-CM | POA: Insufficient documentation

## 2020-09-15 DIAGNOSIS — F419 Anxiety disorder, unspecified: Secondary | ICD-10-CM | POA: Insufficient documentation

## 2020-09-15 DIAGNOSIS — R232 Flushing: Secondary | ICD-10-CM | POA: Insufficient documentation

## 2020-09-15 LAB — CBC WITH DIFFERENTIAL/PLATELET
Abs Immature Granulocytes: 0.01 10*3/uL (ref 0.00–0.07)
Basophils Absolute: 0 10*3/uL (ref 0.0–0.1)
Basophils Relative: 1 %
Eosinophils Absolute: 0.1 10*3/uL (ref 0.0–0.5)
Eosinophils Relative: 1 %
HCT: 37.5 % (ref 36.0–46.0)
Hemoglobin: 12 g/dL (ref 12.0–15.0)
Immature Granulocytes: 0 %
Lymphocytes Relative: 29 %
Lymphs Abs: 1.5 10*3/uL (ref 0.7–4.0)
MCH: 31.2 pg (ref 26.0–34.0)
MCHC: 32 g/dL (ref 30.0–36.0)
MCV: 97.4 fL (ref 80.0–100.0)
Monocytes Absolute: 0.4 10*3/uL (ref 0.1–1.0)
Monocytes Relative: 8 %
Neutro Abs: 3.1 10*3/uL (ref 1.7–7.7)
Neutrophils Relative %: 61 %
Platelets: 306 10*3/uL (ref 150–400)
RBC: 3.85 MIL/uL — ABNORMAL LOW (ref 3.87–5.11)
RDW: 13.7 % (ref 11.5–15.5)
WBC: 5.1 10*3/uL (ref 4.0–10.5)
nRBC: 0 % (ref 0.0–0.2)

## 2020-09-15 LAB — COMPREHENSIVE METABOLIC PANEL
ALT: 13 U/L (ref 0–44)
AST: 17 U/L (ref 15–41)
Albumin: 3.9 g/dL (ref 3.5–5.0)
Alkaline Phosphatase: 81 U/L (ref 38–126)
Anion gap: 10 (ref 5–15)
BUN: 10 mg/dL (ref 6–20)
CO2: 22 mmol/L (ref 22–32)
Calcium: 9.3 mg/dL (ref 8.9–10.3)
Chloride: 110 mmol/L (ref 98–111)
Creatinine, Ser: 0.82 mg/dL (ref 0.44–1.00)
GFR, Estimated: >60 mL/min (ref >60)
Glucose, Bld: 122 mg/dL — ABNORMAL HIGH (ref 70–99)
Potassium: 3.9 mmol/L (ref 3.5–5.1)
Sodium: 142 mmol/L (ref 135–145)
Total Bilirubin: 0.3 mg/dL (ref 0.3–1.2)
Total Protein: 7 g/dL (ref 6.5–8.1)

## 2020-09-15 MED ORDER — SUMATRIPTAN SUCCINATE 25 MG PO TABS: ORAL_TABLET | ORAL | 0 refills | Status: DC

## 2020-09-15 NOTE — Progress Notes (Signed)
[facility]  [phone] [phone]     ID: Sherri Schultz DOB: 1966-06-24  MR#: [id number]  BOF#:[id number]  Patient Care Team: Vicenta Aly, [place] as PCP - General (Nurse Practitioner) Rockwell Germany, RN as Oncology Nurse Navigator Mauro Kaufmann, RN as Oncology Nurse Navigator Stark Klein, MD as Consulting Physician (General Surgery) Karie Skowron, Virgie Dad, MD as Consulting Physician (Oncology) Kyung Rudd, MD as Consulting Physician (Radiation Oncology) Leandrew Koyanagi, MD as Attending Physician (Orthopedic Surgery) Chauncey Cruel, MD OTHER MD:   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  She completed radiation therapy on 05/04/2020 under Dr. Lisbeth Renshaw.  She tolerated that moderately well, with no significant toxicities.  She has recovered her energy since that time  She started tamoxifen on 05/29/2020.  She is tolerating that remarkably well.  She has had some increase in hot flashes but this is not a major issue for her.  Vaginal wetness is actually decreased.  Since her last visit, she reported tenderness to her upper-outer left breast. She underwent left diagnostic mammography with tomography and left breast ultrasonography at [facility] on 09/07/2020 showing: breast density category B; no abnormalities to account for tenderness involving upper-outer left breast; no evidence of malignancy.   REVIEW OF SYSTEMS: Sherri Schultz tells me she has been having severe migraines for quite a while.  She says initially they thought it was due to hypertension and that was controlled but it did not get better so she saw a specialist who started her on Zonegran.  She says this has not been helpful.  She is also receiving injections with Decadron lidocaine and Marcaine and she says she receives 35 injections at a  time all around her head every 2 weeks and that she has 2 more cycles to go.  This also has not provided her with relief she says.  Aside from these issues she just had her colonoscopy under Dr.Mann which she tolerated well.  She also received the J&J vaccine earlier this year for COVID-19.  She is considering a booster.   HISTORY OF CURRENT ILLNESS: From the original intake note:  Sherri Schultz had routine screening mammography on 12/01/2019 showing a possible abnormalities in the left breast. She underwent left diagnostic mammography with tomography and left breast ultrasonography at [facility] on 12/08/2019 showing: breast density category C; suspicious 1.5 cm mass involving the lower-inner quadrant of the left breast at 8:30, with a hyperechoic halo surrounding the mass for a total span of 1.8 cm; indeterminate 0.4 cm mass in the upper-inner quadrant of the left breast at 10 o'clock; no pathologic left axillary lymphadenopathy; benign cyst in the upper-outer subareolar left breast.  Accordingly on 12/14/2019 she proceeded to biopsy of the left breast areas in question. The pathology from this procedure ([id number]) showed:  1. Left breast, 8:30   - ductal carcinoma in situ, grade 3, with apocrine features (0.6 cm on biopsy)  - estrogen receptor, 0% negative and progesterone receptor, 0% negative.  2. Left breast, 10 o'clock  - fibroadenomatoid nodule with columnar cells changes  The patient's subsequent history is as detailed below.   PAST MEDICAL HISTORY: Past Medical History:  Diagnosis Date  . Anxiety   . Breast cancer ([place]) 2021   left breast DCIS  . Cancer [facility]) 2013   right knee  . Family history  of brain cancer   . Family history of breast cancer   . Family history of colon cancer   . Family history of melanoma   . Hypertension   . Trimalleolar fracture of ankle, closed, left, initial encounter     PAST SURGICAL HISTORY: Past Surgical History:  Procedure Laterality  Date  . BREAST BIOPSY Right 10+ yrs ago   benign  . BREAST LUMPECTOMY WITH RADIOACTIVE SEED LOCALIZATION Left 02/15/2020   Procedure: LEFT BREAST LUMPECTOMY WITH RADIOACTIVE SEED LOCALIZATION;  Surgeon: Stark Klein, MD;  Location: [facility];  Service: General;  Laterality: Left;  [facility] MELANOMA EXCISION Right 2013   knee  . ORIF ANKLE FRACTURE Left 05/06/2018   Procedure: OPEN REDUCTION INTERNAL FIXATION (ORIF) LEFT TRIMALLEOLAR ANKLE FRACTURE;  Surgeon: Leandrew Koyanagi, MD;  Location: [facility];  Service: Orthopedics;  Laterality: Left;  . RADIOACTIVE SEED GUIDED EXCISIONAL BREAST BIOPSY Right 02/15/2020   Procedure: RIGHT BREAST RADIOACTIVE SEED LOCALIZATION EXCISIONAL BIOPSY X 2;  Surgeon: Stark Klein, MD;  Location: [facility];  Service: General;  Laterality: Right;    FAMILY HISTORY: Family History  Problem Relation Age of Onset  . Breast cancer Mother 53  . Diabetes Mother   . Colon cancer Mother 64  . Breast cancer Maternal Aunt        dx. >50  . Diabetes Father   . Hypertension Father   . Heart attack Father   . Arthritis Father   . Diabetes Brother   . Melanoma Brother 30  . Diabetes Brother   . Breast cancer Cousin        dx. 40s/50s  . Brain cancer Maternal Uncle        dx. 65s  . Breast cancer Maternal Aunt        dx. >50  . Breast cancer Maternal Aunt        dx. >50  . Cancer Maternal Uncle        unknown type, dx. >50  . Breast cancer Cousin        dx. 40s/50s   The patient's father died at age 44 from congestive heart failure.  Patient's mother died from breast cancer at age 86, diagnosed age 34.. The patient denies a family hx of ovarian cancer. She reports breast cancer in 2 maternal aunts and 2 maternal first cousins.  The patient has 2 brothers with no history of cancer.   GYNECOLOGIC HISTORY:  Patient's last menstrual period was 03/26/2016. Menarche: 54 years old [place] P 0 LMP 2017 HRT no  Hysterectomy?  no BSO? 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  They mostly have trees in their [place] acres.  Her husband Sherri Schultz (the patient kept her name on her insurance) used to work as a Clinical cytogeneticist for [employer] but is now also retired.  They have no children and no pets.  The patient attends a local [employer] DIRECTIVES: In the absence of any documents to the contrary the patient's husband is her healthcare power of attorney   HEALTH MAINTENANCE: Social History   Tobacco Use  . Smoking status: Never Smoker  . Smokeless tobacco: Never Used  Substance Use Topics  . Alcohol use: Never  . Drug use: Never     Colonoscopy: 2021/Mann  PAP: 04/2016, negative  Bone density: Never   No Known Allergies  Current  Outpatient Medications  Medication Sig Dispense Refill  . calcium-vitamin D (OSCAL WITH D) 500-200 MG-UNIT tablet Take 1 tablet by mouth 3 (three) times daily. 90 tablet 12  . estradiol (ESTRACE VAGINAL) 0.1 MG/GM vaginal cream Place 1 Applicatorful vaginally at bedtime. 42.5 g 12  . meclizine (ANTIVERT) 25 MG tablet Take 1 tablet by mouth 3 (three) times daily as needed.    . propranolol (INDERAL) 40 MG tablet Take 40 mg by mouth daily.    . SUMAtriptan (IMITREX) 25 MG tablet May repeat in 2 hours if headache persists or recurs. 10 tablet 0  . valACYclovir (VALTREX) 1000 MG tablet Take 2 tablets by mouth 2 (two) times daily as needed.    . venlafaxine XR (EFFEXOR-XR) 150 MG 24 hr capsule Take 1 capsule by mouth daily.  0  . zinc sulfate 220 (50 Zn) MG capsule Take 1 capsule (220 mg total) by mouth daily. 42 capsule 0   No current facility-administered medications for this visit.    OBJECTIVE: White woman who appears stated age  54:   09/15/20 1217  BP: 127/76  Pulse: 76  Resp: 19  Temp: 98.4 F (36.9 C)  SpO2: 99%     Body mass index is 29.25 kg/m.   Wt Readings from Last 3  Encounters:  09/15/20 170 lb 6.4 oz (77.3 kg)  03/22/20 163 lb 14.4 oz (74.3 kg)  02/15/20 162 lb 11.2 oz (73.8 kg)      ECOG FS: 1  Sclerae unicteric, EOMs intact Wearing a mask No cervical or supraclavicular adenopathy Lungs no rales or rhonchi Heart regular rate and rhythm Abd soft, nontender, positive bowel sounds MSK no focal spinal tenderness, no upper extremity lymphedema Neuro: nonfocal, well oriented, appropriate affect Breasts: The right breast is status post benign lumpectomy.  There are no findings of concern for the left breast is status post lumpectomy and radiation.  There are the expected mild hyperpigmentation and mild skin coarsening but overall the cosmetic result is good there is no evidence of local recurrence.  Both axillae are benign.    LAB RESULTS:  CMP     Component Value Date/Time   NA 142 09/15/2020 1205   K 3.9 09/15/2020 1205   CL 110 09/15/2020 1205   CO2 22 09/15/2020 1205   GLUCOSE 122 (H) 09/15/2020 1205   BUN 10 09/15/2020 1205   CREATININE 0.82 09/15/2020 1205   CREATININE 0.90 12/22/2019 0827   CALCIUM 9.3 09/15/2020 1205   PROT 7.0 09/15/2020 1205   ALBUMIN 3.9 09/15/2020 1205   AST 17 09/15/2020 1205   AST 18 12/22/2019 0827   ALT 13 09/15/2020 1205   ALT 16 12/22/2019 0827   ALKPHOS 81 09/15/2020 1205   BILITOT 0.3 09/15/2020 1205   BILITOT 0.3 12/22/2019 0827   GFRNONAA >60 09/15/2020 1205   GFRNONAA >60 12/22/2019 0827   GFRAA >60 12/22/2019 0827    No results found for: TOTALPROTELP, ALBUMINELP, A1GS, A2GS, BETS, BETA2SER, GAMS, MSPIKE, SPEI  Lab Results  Component Value Date   WBC 5.1 09/15/2020   NEUTROABS 3.1 09/15/2020   HGB 12.0 09/15/2020   HCT 37.5 09/15/2020   MCV 97.4 09/15/2020   PLT 306 09/15/2020    No results found for: LABCA2  No components found for: EHMCNO709  No results for input(s): INR in the last 168 hours.  No results found for: LABCA2  No results found for: GGE366  No results found  for: QHU765  No results found for: YYT035  No results found for: CA2729  No components found for: HGQUANT  No results found for: CEA1 / No results found for: CEA1   No results found for: AFPTUMOR  No results found for: CHROMOGRNA  No results found for: KPAFRELGTCHN, LAMBDASER, KAPLAMBRATIO (kappa/lambda light chains)  No results found for: HGBA, HGBA2QUANT, HGBFQUANT, HGBSQUAN (Hemoglobinopathy evaluation)   No results found for: LDH  No results found for: IRON, TIBC, IRONPCTSAT (Iron and TIBC)  No results found for: FERRITIN  Urinalysis No results found for: COLORURINE, APPEARANCEUR, LABSPEC, PHURINE, GLUCOSEU, HGBUR, BILIRUBINUR, KETONESUR, PROTEINUR, UROBILINOGEN, NITRITE, LEUKOCYTESUR   STUDIES: US BREAST LTD UNI LEFT INC AXILLA  Result Date: 09/07/2020 CLINICAL DATA:  54 year old female with left breast DCIS post lumpectomy 02/15/2020 followed by radiation therapy. Patient reports tenderness involving the upper-outer quadrant of the left breast. EXAM: DIGITAL DIAGNOSTIC UNILATERAL LEFT MAMMOGRAM WITH TOMO AND CAD; ULTRASOUND LEFT BREAST LIMITED COMPARISON:  Previous exams. ACR Breast Density Category b: There are scattered areas of fibroglandular density. FINDINGS: No suspicious masses or calcifications are seen in the left breast. New lumpectomy changes are present in the lower inner left breast. There is no mammographic evidence of malignancy in the left breast. Mammographic images were processed with CAD. Physical examination of the upper-outer left breast reveals tenderness with deep palpation at the approximate 2 to 4 o'clock position. Targeted ultrasound of the left breast was performed. No suspicious masses or abnormality seen, only heterogeneous fibroglandular tissue identified. IMPRESSION: 1. No mammographic or sonographic abnormalities to account for tenderness involving the upper-outer quadrant of the left breast. 2. New lumpectomy changes in the left breast. No  mammographic evidence of malignancy. RECOMMENDATION: Bilateral diagnostic mammography January 2022. I have discussed the findings and recommendations with the patient. If applicable, a reminder letter will be sent to the patient regarding the next appointment. BI-RADS CATEGORY  2: Benign. Electronically Signed   By: Everlean Alstrom M.D.   On: 09/07/2020 16:33   MM DIAG BREAST TOMO UNI LEFT  Result Date: 09/07/2020 CLINICAL DATA:  54 year old female with left breast DCIS post lumpectomy 02/15/2020 followed by radiation therapy. Patient reports tenderness involving the upper-outer quadrant of the left breast. EXAM: DIGITAL DIAGNOSTIC UNILATERAL LEFT MAMMOGRAM WITH TOMO AND CAD; ULTRASOUND LEFT BREAST LIMITED COMPARISON:  Previous exams. ACR Breast Density Category b: There are scattered areas of fibroglandular density. FINDINGS: No suspicious masses or calcifications are seen in the left breast. New lumpectomy changes are present in the lower inner left breast. There is no mammographic evidence of malignancy in the left breast. Mammographic images were processed with CAD. Physical examination of the upper-outer left breast reveals tenderness with deep palpation at the approximate 2 to 4 o'clock position. Targeted ultrasound of the left breast was performed. No suspicious masses or abnormality seen, only heterogeneous fibroglandular tissue identified. IMPRESSION: 1. No mammographic or sonographic abnormalities to account for tenderness involving the upper-outer quadrant of the left breast. 2. New lumpectomy changes in the left breast. No mammographic evidence of malignancy. RECOMMENDATION: Bilateral diagnostic mammography January 2022. I have discussed the findings and recommendations with the patient. If applicable, a reminder letter will be sent to the patient regarding the next appointment. BI-RADS CATEGORY  2: Benign. Electronically Signed   By: Everlean Alstrom M.D.   On: 09/07/2020 16:33     ELIGIBLE  FOR AVAILABLE RESEARCH PROTOCOL: No  ASSESSMENT: 54 y.o. [place] woman status post left breast biopsy 12/14/2019 for ductal carcinoma in situ, clinically 1.8 cm, grade 3, estrogen and progesterone receptor  negative  (1) genetics testing 01/01/2020 through the STAT Breast cancer panel offered by [facility] found no deleterious mutations in  ATM, BRCA1, BRCA2, CDH1, CHEK2, PALB2, PTEN, STK11 and TP53.  The Multi-Cancer Panel offered by [facility] includes also found no deleterious mutations inAIP, ALK, APC, ATM, AXIN2,BAP1,  BARD1, BLM, BMPR1A, BRCA1, BRCA2, BRIP1, CASR, CDC73, CDH1, CDK4, CDKN1B, CDKN1C, CDKN2A (p14ARF), CDKN2A (p16INK4a), CEBPA, CHEK2, CTNNA1, DICER1, DIS3L2, EGFR (c.2369C>T, p.Thr790Met variant only), EPCAM (Deletion/duplication testing only), FH, FLCN, GATA2, GPC3, GREM1 (Promoter region deletion/duplication testing only), HOXB13 (c.251G>A, p.Gly84Glu), HRAS, KIT, MAX, MEN1, MET, MITF (c.952G>A, p.Glu318Lys variant only), MLH1, MSH2, MSH3, MSH6, MUTYH, NBN, NF1, NF2, NTHL1, PALB2, PDGFRA, PHOX2B, PMS2, POLD1, POLE, POT1, PRKAR1A, PTCH1, PTEN, RAD50, RAD51C, RAD51D, RB1, RECQL4, RET, RNF43, RUNX1, SDHAF2, SDHA (sequence changes only), SDHB, SDHC, SDHD, SMAD4, SMARCA4, SMARCB1, SMARCE1, STK11, SUFU, TERC, TERT, TMEM127, TP53, TSC1, TSC2, VHL, WRN and WT1.     (a) a variant of  uncertain significance was detected in the MSH3 gene called c.230_232dup (p.Pro77dup).   (2) status post bilateral lumpectomies 02/15/2020, showing  (a) on the right, no evidence of malignancy (radial scar, fibroadenoma).  (b) on the left, ductal carcinoma in situ, grade 3, measuring 1.4 cm, with negative margins  (3) adjuvant radiation 03/20/2020 - 05/04/2020  (a) left breast / 50.4 Gy in 28 fractions  (b) seroma boost / 10 Gy in 5 fractions  (4) started tamoxifen 05/29/2020   PLAN: Sherri Schultz is now about a half a year out from definitive surgery for breast cancer with no evidence of disease recurrence.  This is  very favorable.  She is tolerating tamoxifen well and the plan will be to continue that a total of 5 years.  She is not happy with the current management of her migraines and is considering second opinions.  I suggested she might want to add a low-dose of sumatriptan on onset of headache and see if that is helpful.  I wrote her for 10 tablets with no refills.  She will let me know if this causes any problems  Otherwise she will have a repeat mammography in the spring of next year.  She will see me again a year from now.  Total encounter time 25 minutes.Chauncey Cruel, MD   09/15/2020 1:37 PM Medical Oncology and Hematology [facility] [place], [place] [address] Tel. [phone]    Fax. [phone]   This document serves as a record of services personally performed by Lurline Del, MD. It was created on his behalf by Wilburn Mylar, a trained medical scribe. The creation of this record is based on the scribe's personal observations and the provider's statements to them.   I, Lurline Del MD, have reviewed the above documentation for accuracy and completeness, and I agree with the above.   *Total Encounter Time as defined by the Centers for Medicare and Medicaid Services includes, in addition to the face-to-face time of a patient visit (documented in the note above) non-face-to-face time: obtaining and reviewing outside history, ordering and reviewing medications, tests or procedures, care coordination (communications with other health care professionals or caregivers) and documentation in the medical record.

## 2020-09-20 ENCOUNTER — Telehealth: Payer: Self-pay | Admitting: Oncology

## 2020-09-20 NOTE — Telephone Encounter (Signed)
Scheduled per 10/22 los. Called and spoke with pt, confirmed 10/20 appts

## 2021-02-03 ENCOUNTER — Encounter (HOSPITAL_COMMUNITY): Payer: Self-pay

## 2021-02-22 ENCOUNTER — Other Ambulatory Visit: Payer: Self-pay | Admitting: Oncology

## 2021-02-22 DIAGNOSIS — Z9889 Other specified postprocedural states: Secondary | ICD-10-CM

## 2021-03-08 ENCOUNTER — Ambulatory Visit
Admission: RE | Admit: 2021-03-08 | Discharge: 2021-03-08 | Disposition: A | Discharge status: Home / Self Care | Payer: BC Managed Care – PPO | Source: Ambulatory Visit | Attending: Oncology | Admitting: Oncology

## 2021-03-08 ENCOUNTER — Other Ambulatory Visit: Payer: Self-pay | Admitting: Oncology

## 2021-03-08 ENCOUNTER — Other Ambulatory Visit: Payer: Self-pay

## 2021-03-08 DIAGNOSIS — R928 Other abnormal and inconclusive findings on diagnostic imaging of breast: Secondary | ICD-10-CM

## 2021-03-08 DIAGNOSIS — Z9889 Other specified postprocedural states: Secondary | ICD-10-CM

## 2021-03-08 DIAGNOSIS — N6310 Unspecified lump in the right breast, unspecified quadrant: Secondary | ICD-10-CM

## 2021-03-08 HISTORY — DX: Personal history of irradiation: Z92.3

## 2021-03-08 IMAGING — MG DIGITAL DIAGNOSTIC BILAT W/ TOMO W/ CAD
6 of 9 series · 6 of 25 positions shown · non-contrast
Comparison: Previous exam(s).

CLINICAL DATA: 54-year-old female status post malignant left
lumpectomy for DCIS with radiation therapy and 2 right breast
excisional biopsies in [DATE] presents for routine annual
mammogram.

EXAM:
DIGITAL DIAGNOSTIC BILATERAL MAMMOGRAM WITH TOMOSYNTHESIS AND CAD;
ULTRASOUND RIGHT BREAST LIMITED
TECHNIQUE: Bilateral digital diagnostic mammography and breast tomosynthesis
was performed. The images were evaluated with computer-aided
detection.; Targeted ultrasound examination of the right breast was
performed

[L CC]
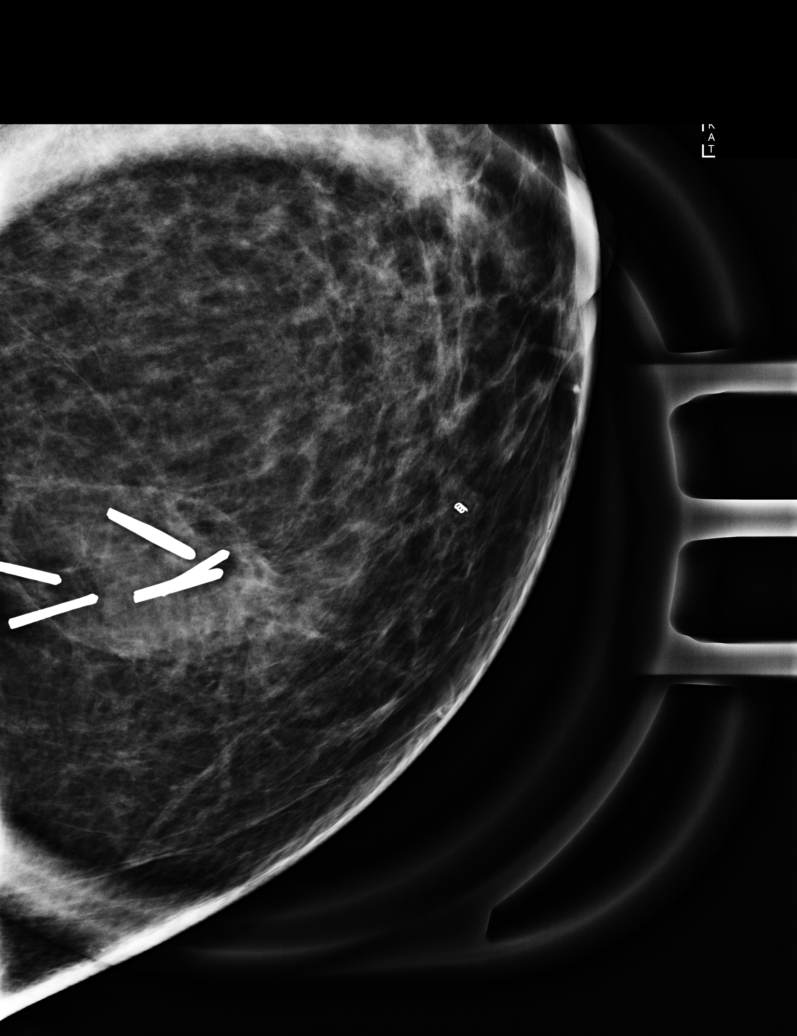

[L CC synth-2D]
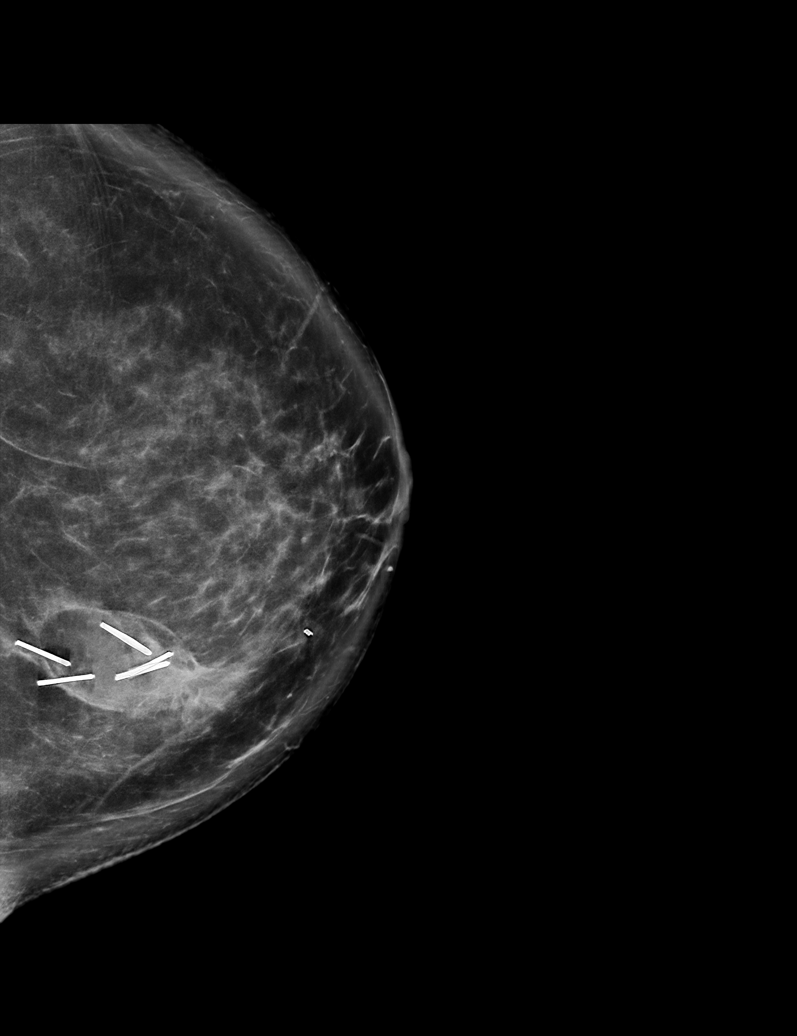

[L MLO synth-2D]
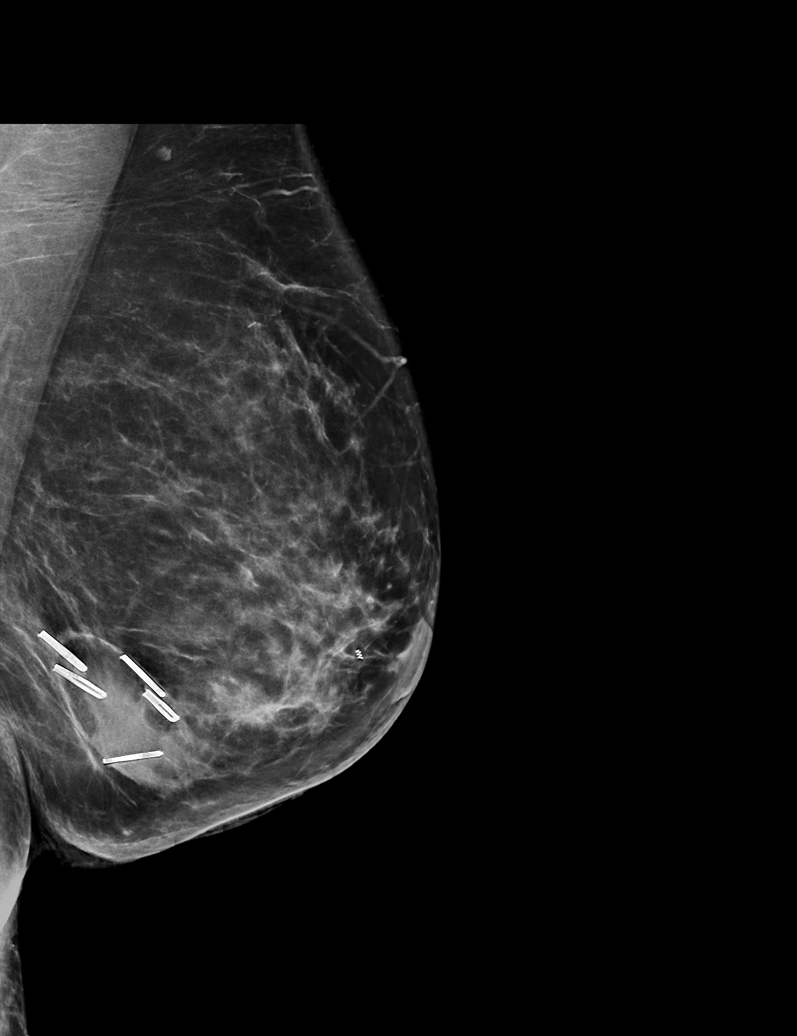

[R CC synth-2D]
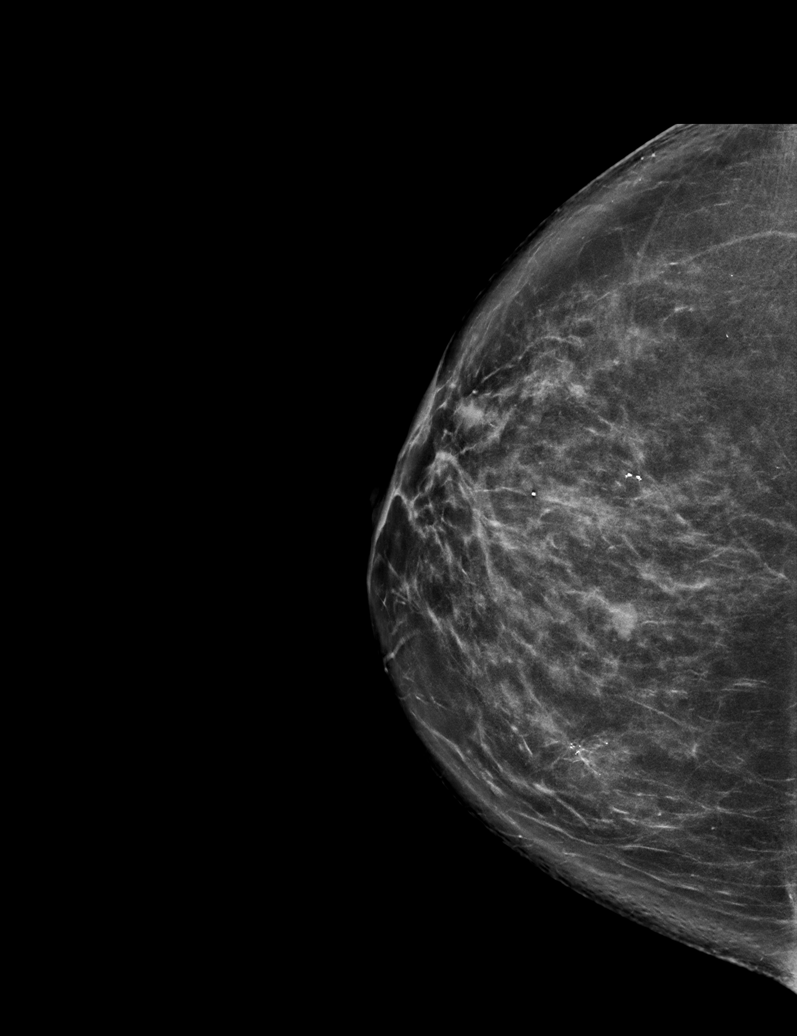

[R MLO synth-2D]
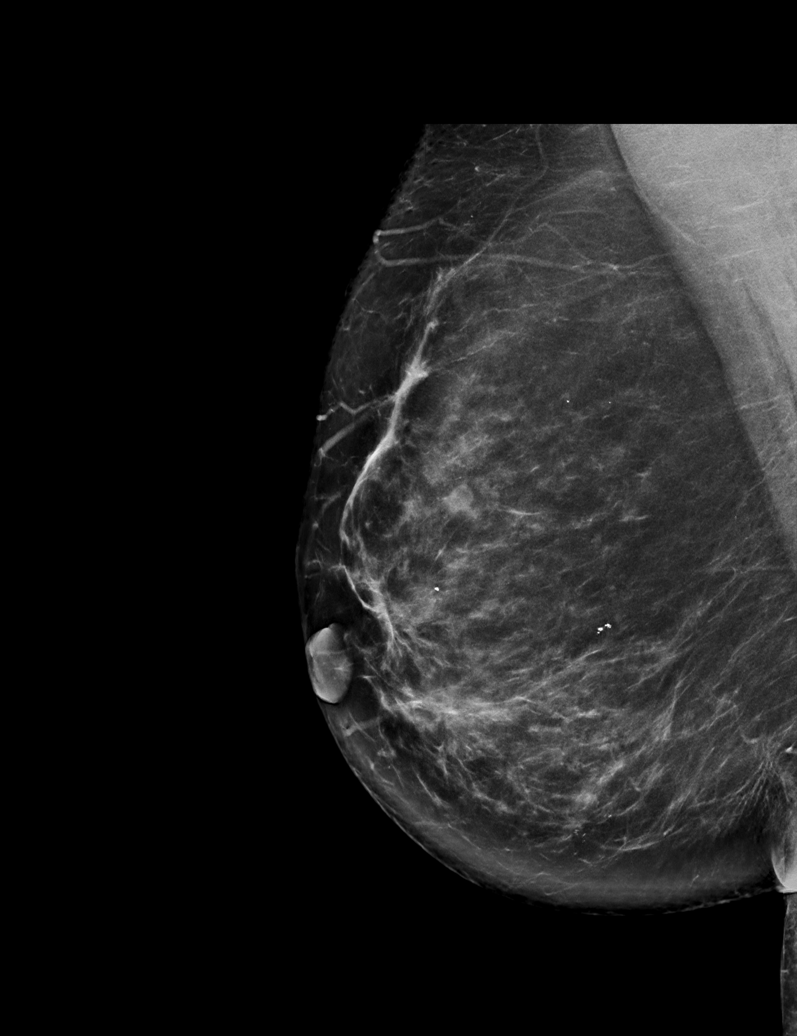

[R MLO tomo · tomo slice 43/84.0]
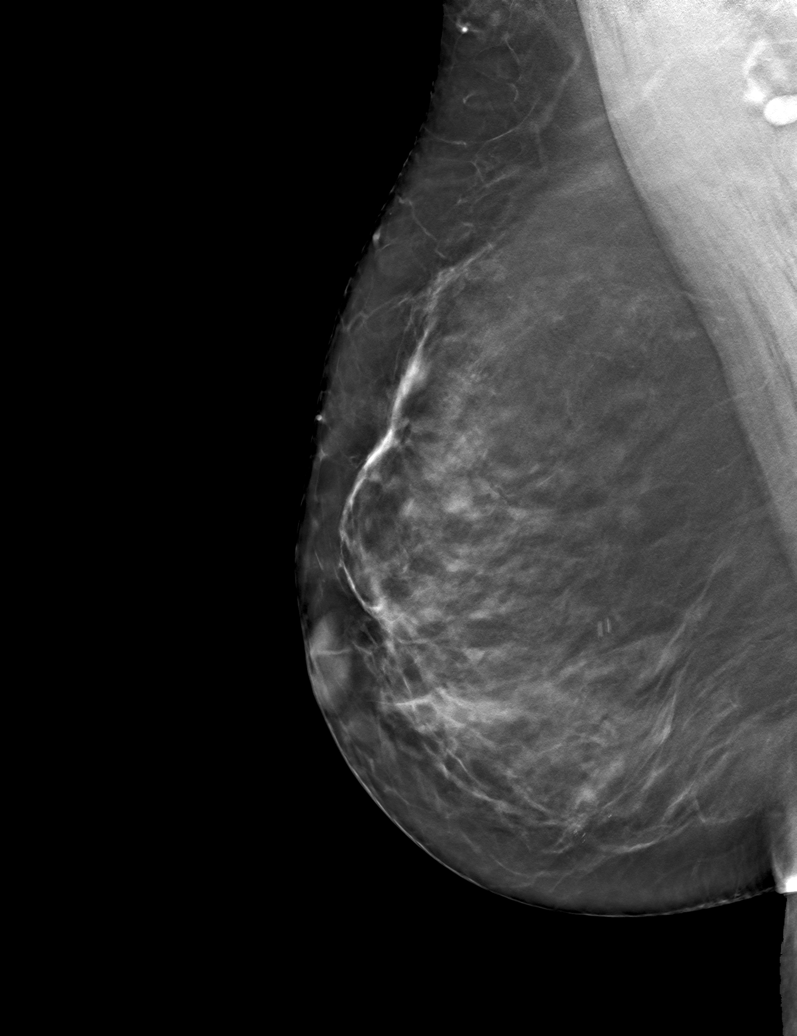

[6 of 25 positions shown; findings below may reference images not displayed]

ACR Breast Density Category b: There are scattered areas of
fibroglandular density.
FINDINGS: Stable post lumpectomy changes noted in the lower inner quadrant of
the left breast. No new or suspicious findings on the left.

Stable post excisional biopsy changes noted on the right. There has
been interval development of an oval, circumscribed hyperdense mass
in the upper inner quadrant at mid to posterior depth. This
demonstrates slightly angular margins. Further evaluation with
ultrasound was performed. Otherwise no suspicious findings on the
right.

Targeted ultrasound is performed, showing an oval, circumscribed
hypoechoic mass at the 1 o'clock position 5 cm from the nipple. It
measures 6 x 4 x 4 mm. There is no internal vascularity. This
demonstrates slightly irregular/angular borders. Evaluation of the
right axilla demonstrates no suspicious lymphadenopathy.
IMPRESSION: 1. Indeterminate right breast mass at the 1 o'clock position likely
represents a complicated cyst. However, ultrasound-guided aspiration
is recommended. If this area does not aspirate to completion,
ultrasound-guided biopsy is then warranted.
2. Post lumpectomy changes without mammographic evidence of
malignancy on the left.

RECOMMENDATION:
Ultrasound-guided aspiration of a probable complicated cyst at the 1
o'clock position of the right breast. If this area does not aspirate
to completion, ultrasound-guided biopsy is then warranted.

I have discussed the findings and recommendations with the patient.
If applicable, a reminder letter will be sent to the patient
regarding the next appointment.

BI-RADS CATEGORY  4: Suspicious.

## 2021-03-08 IMAGING — US US BREAST*R* LIMITED INC AXILLA
1 series · 6 of 6 positions shown · non-contrast
Comparison: Previous exam(s).

CLINICAL DATA: 54-year-old female status post malignant left
lumpectomy for DCIS with radiation therapy and 2 right breast
excisional biopsies in [DATE] presents for routine annual
mammogram.

EXAM:
DIGITAL DIAGNOSTIC BILATERAL MAMMOGRAM WITH TOMOSYNTHESIS AND CAD;
ULTRASOUND RIGHT BREAST LIMITED
TECHNIQUE: Bilateral digital diagnostic mammography and breast tomosynthesis
was performed. The images were evaluated with computer-aided
detection.; Targeted ultrasound examination of the right breast was
performed

[Series 1: us breast*right* limited inc axilla · 0.06mm/px · 6 of 6 slices shown]
[im 1/6]
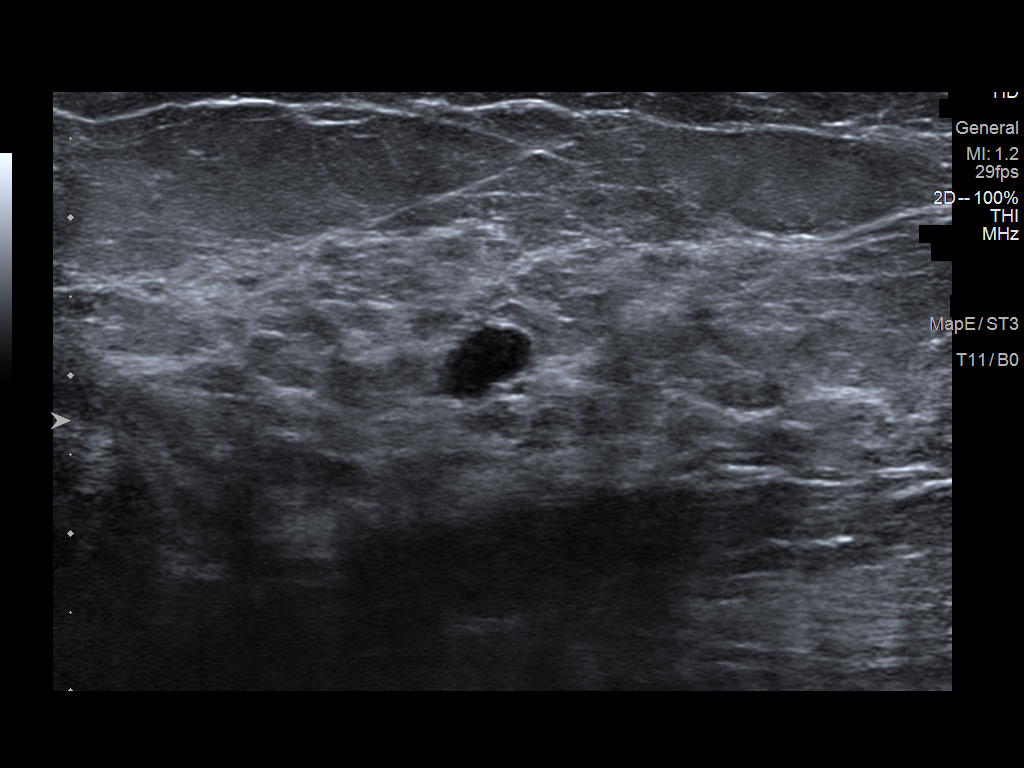
[im 2/6]
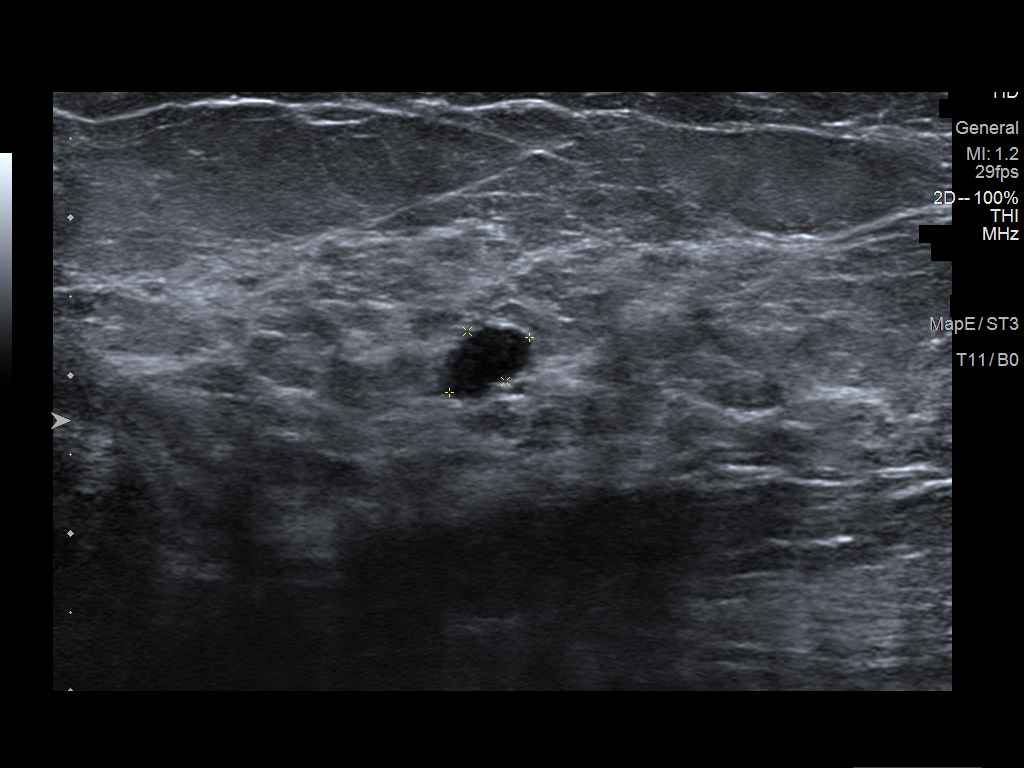
[im 3/6]
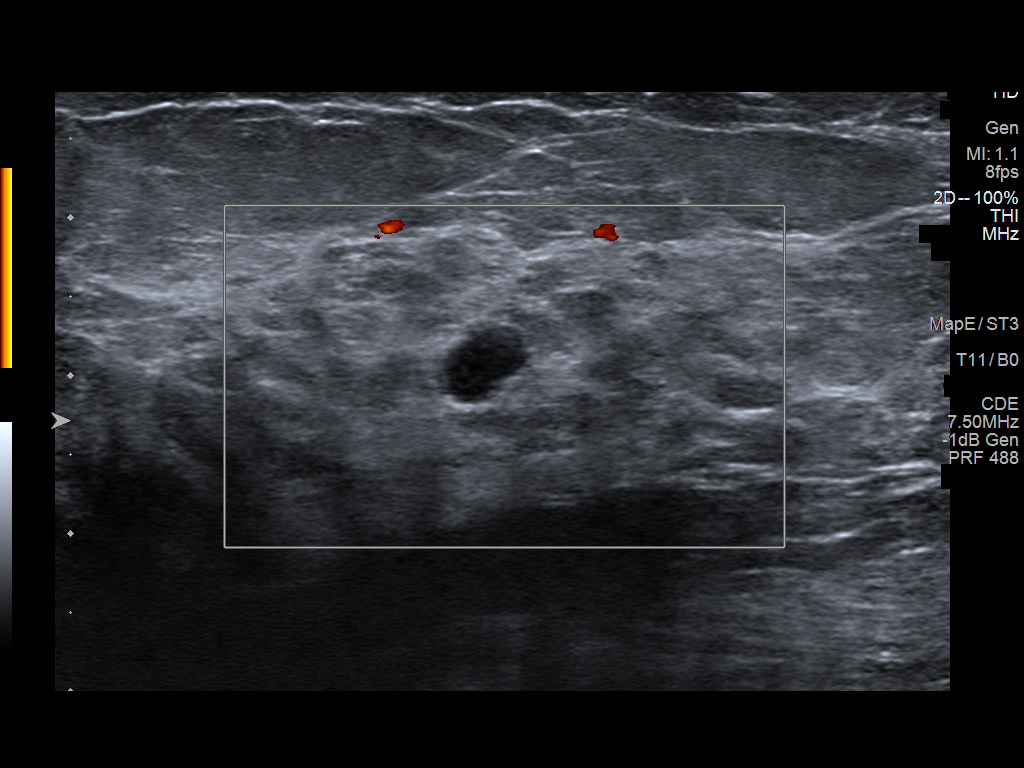
[im 4/6]
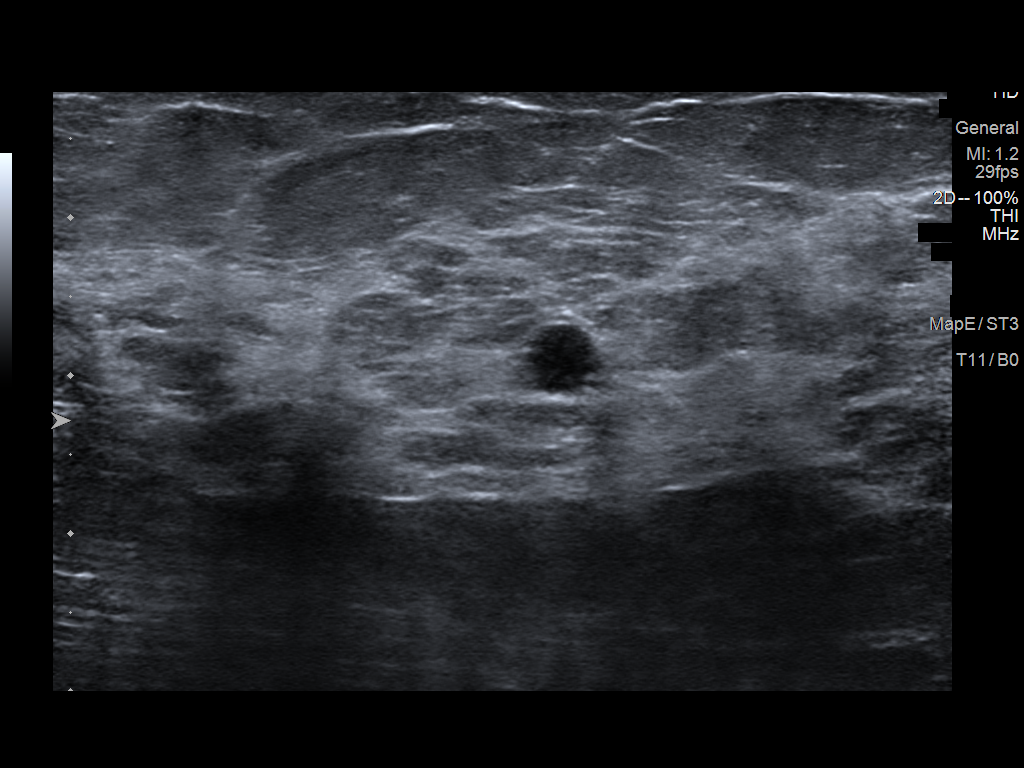
[im 5/6]
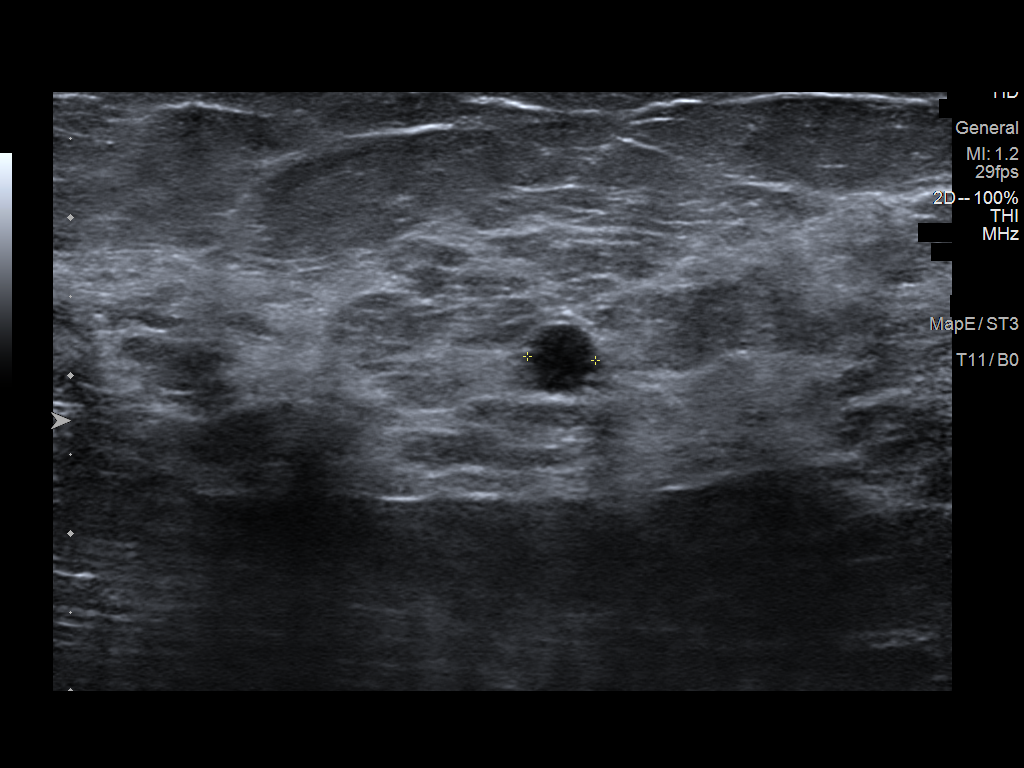
[im 6/6]
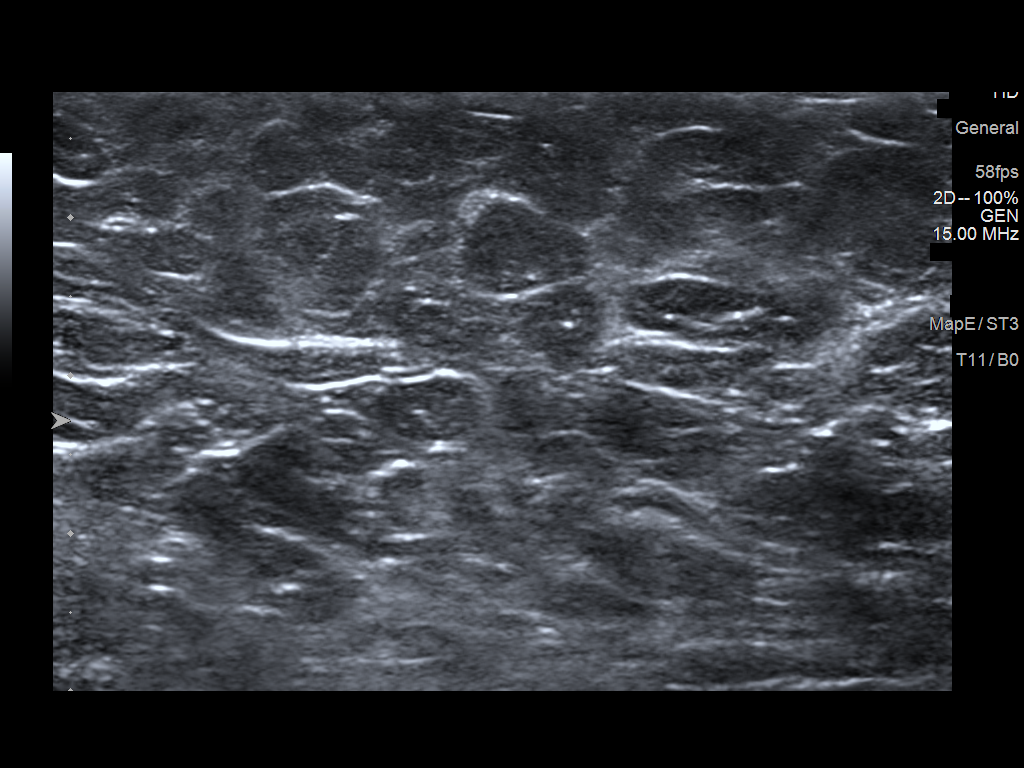

[6 of 6 positions shown; findings below may reference images not displayed]

ACR Breast Density Category b: There are scattered areas of
fibroglandular density.
FINDINGS: Stable post lumpectomy changes noted in the lower inner quadrant of
the left breast. No new or suspicious findings on the left.

Stable post excisional biopsy changes noted on the right. There has
been interval development of an oval, circumscribed hyperdense mass
in the upper inner quadrant at mid to posterior depth. This
demonstrates slightly angular margins. Further evaluation with
ultrasound was performed. Otherwise no suspicious findings on the
right.

Targeted ultrasound is performed, showing an oval, circumscribed
hypoechoic mass at the 1 o'clock position 5 cm from the nipple. It
measures 6 x 4 x 4 mm. There is no internal vascularity. This
demonstrates slightly irregular/angular borders. Evaluation of the
right axilla demonstrates no suspicious lymphadenopathy.
IMPRESSION: 1. Indeterminate right breast mass at the 1 o'clock position likely
represents a complicated cyst. However, ultrasound-guided aspiration
is recommended. If this area does not aspirate to completion,
ultrasound-guided biopsy is then warranted.
2. Post lumpectomy changes without mammographic evidence of
malignancy on the left.

RECOMMENDATION:
Ultrasound-guided aspiration of a probable complicated cyst at the 1
o'clock position of the right breast. If this area does not aspirate
to completion, ultrasound-guided biopsy is then warranted.

I have discussed the findings and recommendations with the patient.
If applicable, a reminder letter will be sent to the patient
regarding the next appointment.

BI-RADS CATEGORY  4: Suspicious.

## 2021-03-12 ENCOUNTER — Other Ambulatory Visit: Payer: Self-pay | Admitting: Oncology

## 2021-03-12 ENCOUNTER — Ambulatory Visit
Admission: RE | Admit: 2021-03-12 | Discharge: 2021-03-12 | Disposition: A | Discharge status: Home / Self Care | Payer: BC Managed Care – PPO | Source: Ambulatory Visit | Attending: Oncology | Admitting: Oncology

## 2021-03-12 ENCOUNTER — Other Ambulatory Visit: Payer: Self-pay

## 2021-03-12 DIAGNOSIS — N6310 Unspecified lump in the right breast, unspecified quadrant: Secondary | ICD-10-CM

## 2021-03-12 HISTORY — PX: BREAST CYST ASPIRATION: SHX578

## 2021-03-12 IMAGING — MG MM DIGITAL DIAGNOSTIC UNILAT*R* W/ TOMO W/ CAD
4 series · 4 of 12 positions shown · non-contrast
Comparison: Previous exam(s).

CLINICAL DATA: Postprocedural mammogram of the right breast.

EXAM:
DIAGNOSTIC RIGHT MAMMOGRAM POST ULTRASOUND ASPIRATION

[R CC synth-2D]
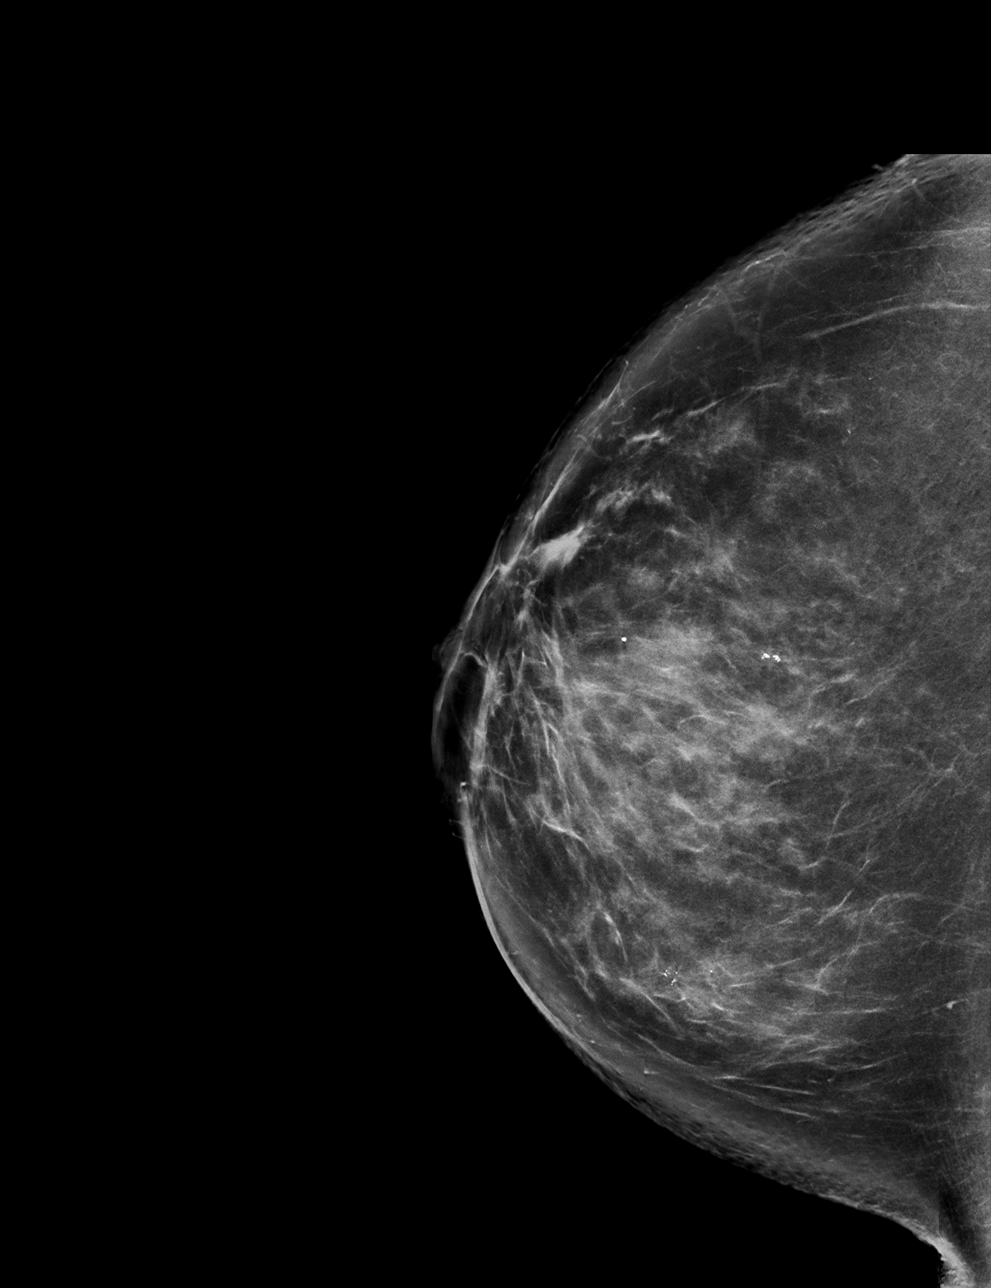

[R MLO synth-2D]
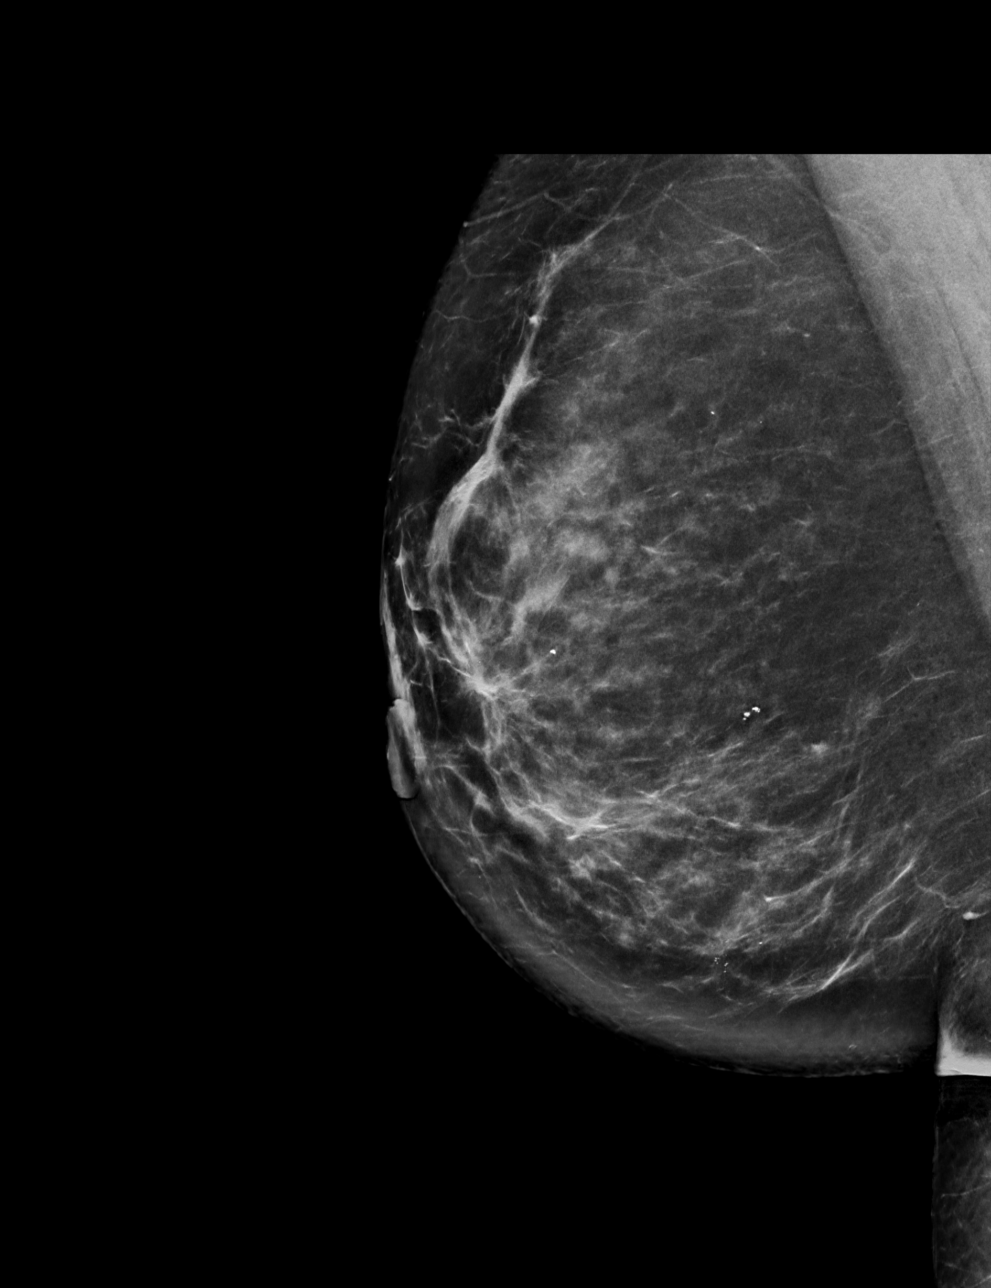

[R CC tomo · tomo slice 45/89.0]
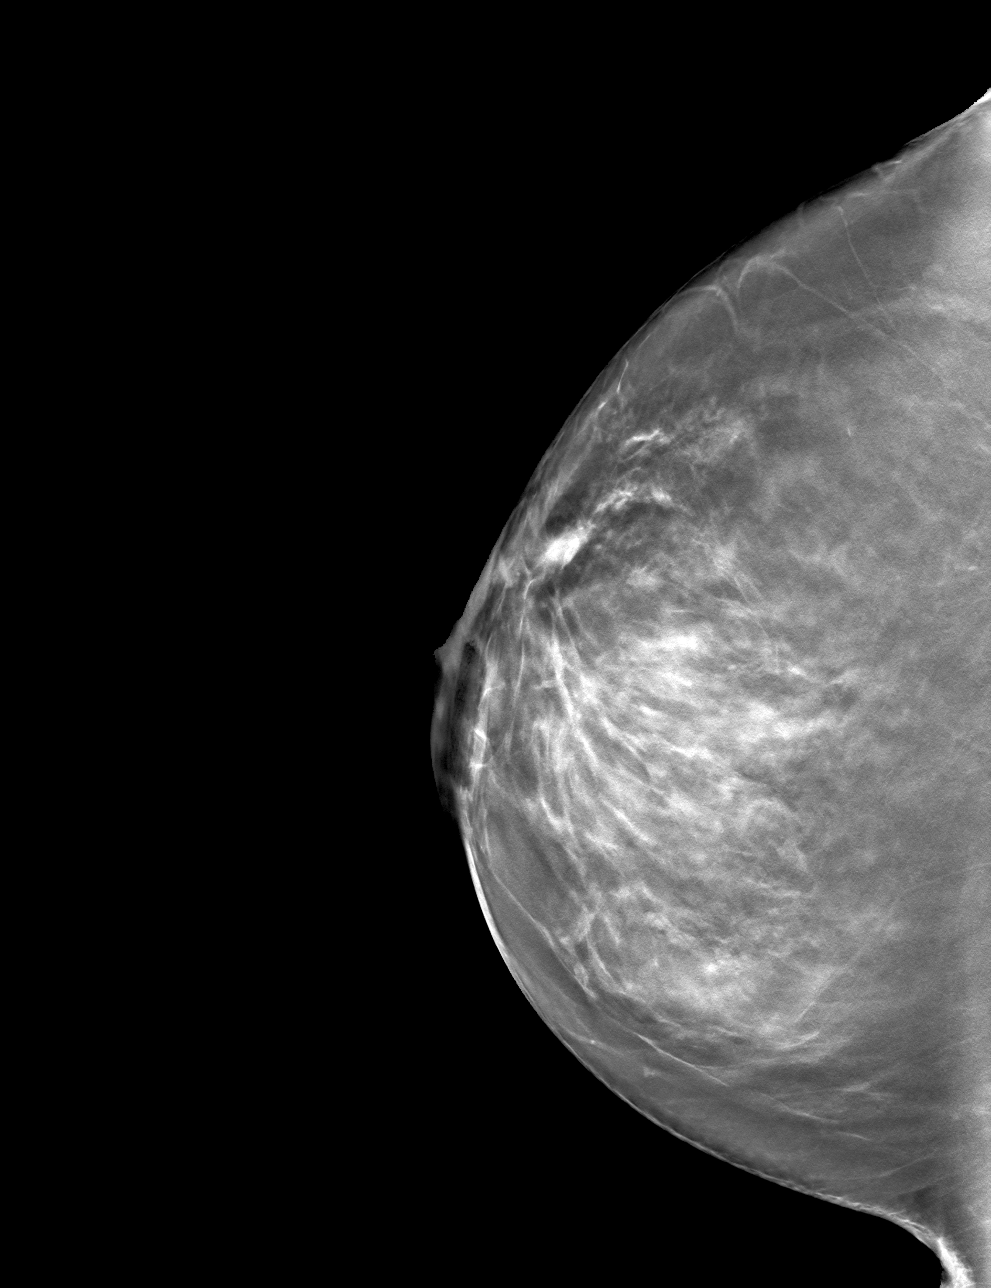

[R MLO tomo · tomo slice 42/83.0]
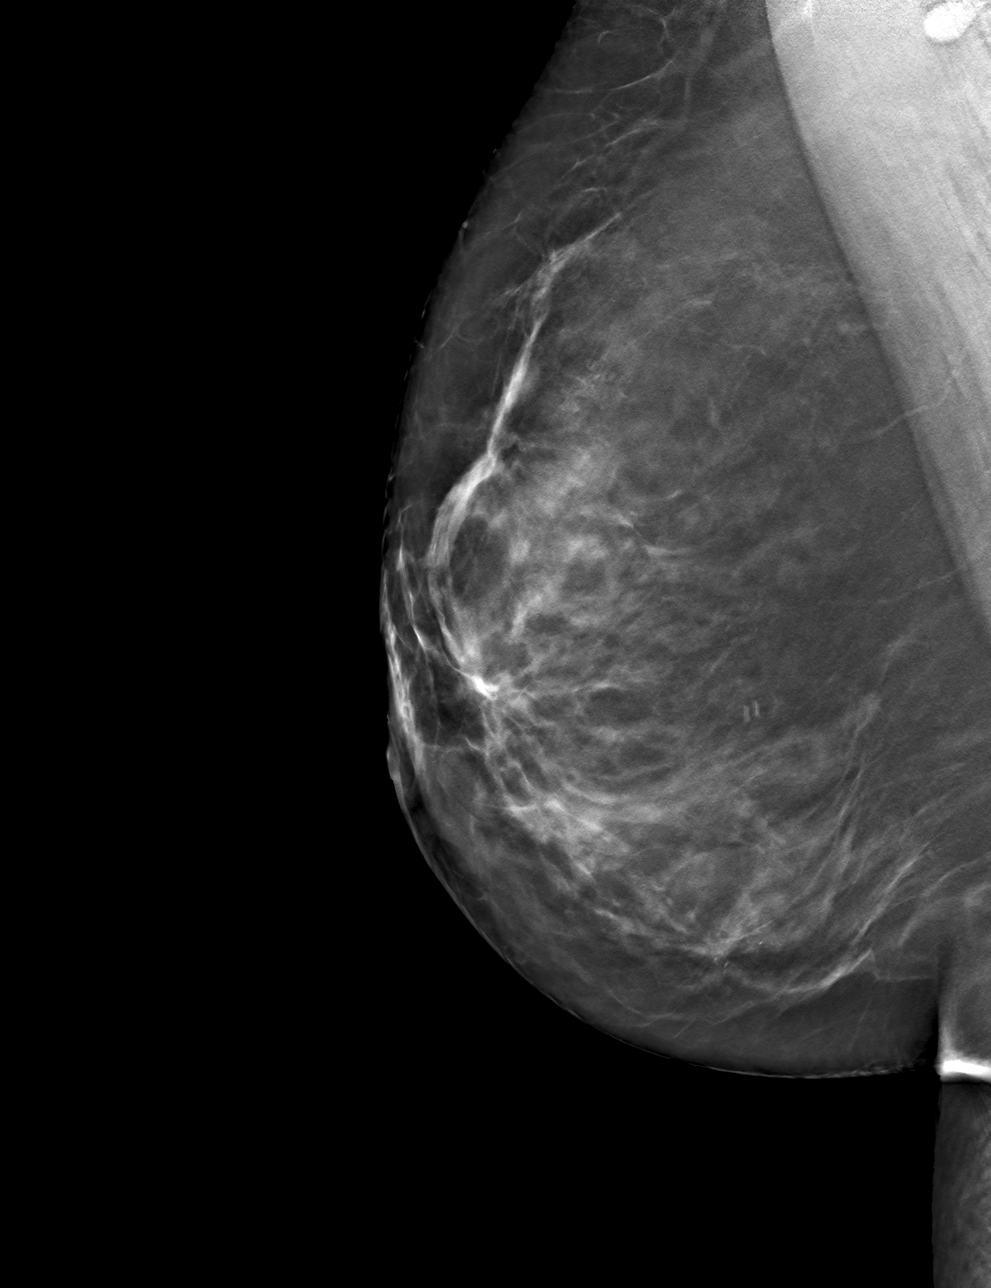

[4 of 12 positions shown; findings below may reference images not displayed]

FINDINGS: Mammographic images were obtained following ultrasound guided
aspiration of a cyst in the right breast at 1 o'clock, 5 cm from the
nipple. The mass previously seen in this location has resolved.
IMPRESSION: Resolution of the mass in the upper inner right breast post
aspiration.

Final Assessment: Post Procedure Mammograms for Marker Placement

## 2021-03-12 IMAGING — US US ASPIRATION RIGHT BREAST
1 series · 3 of 3 positions shown · non-contrast
Comparison: Previous exams.

CLINICAL DATA: Ultrasound-guided aspiration of a right breast cyst.

EXAM:
ULTRASOUND GUIDED RIGHT BREAST CYST ASPIRATION

[Series 1: us aspiration right breast · 0.06mm/px · 3 of 3 slices shown]
[im 1/3]
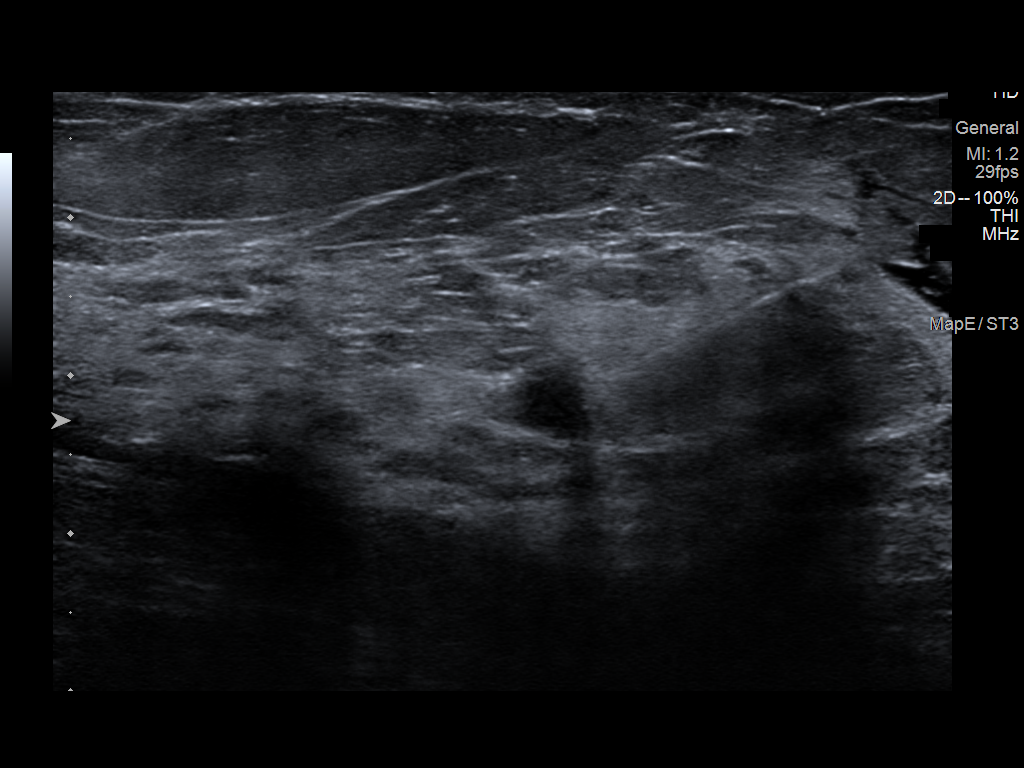
[im 2/3]
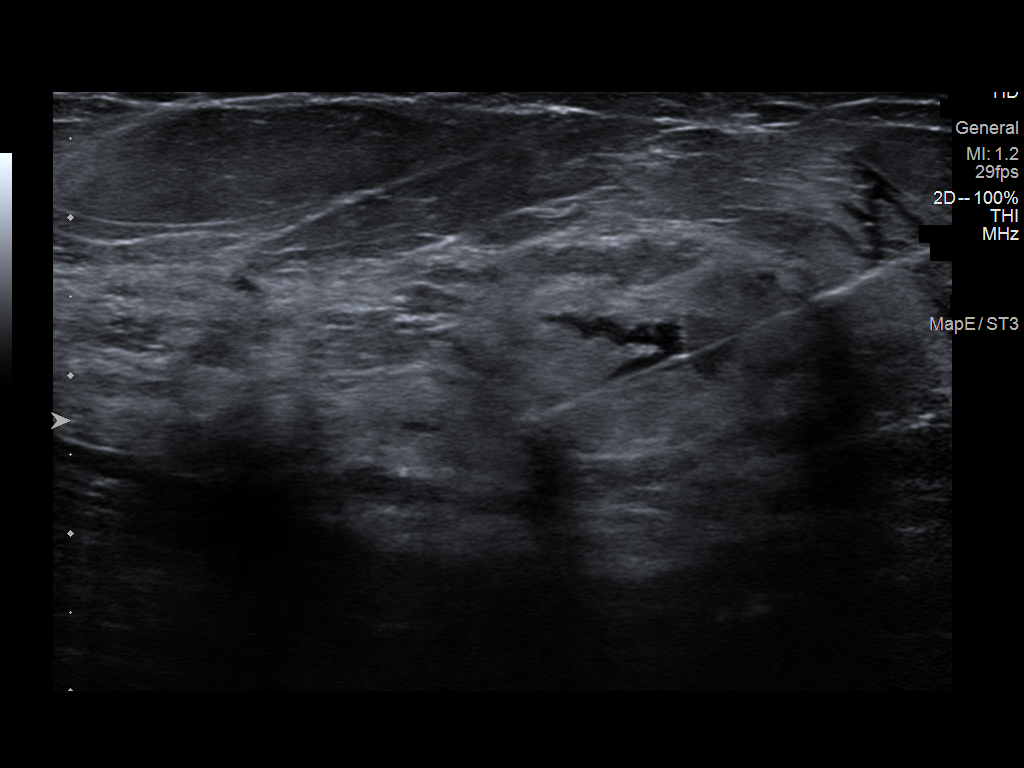
[im 3/3]
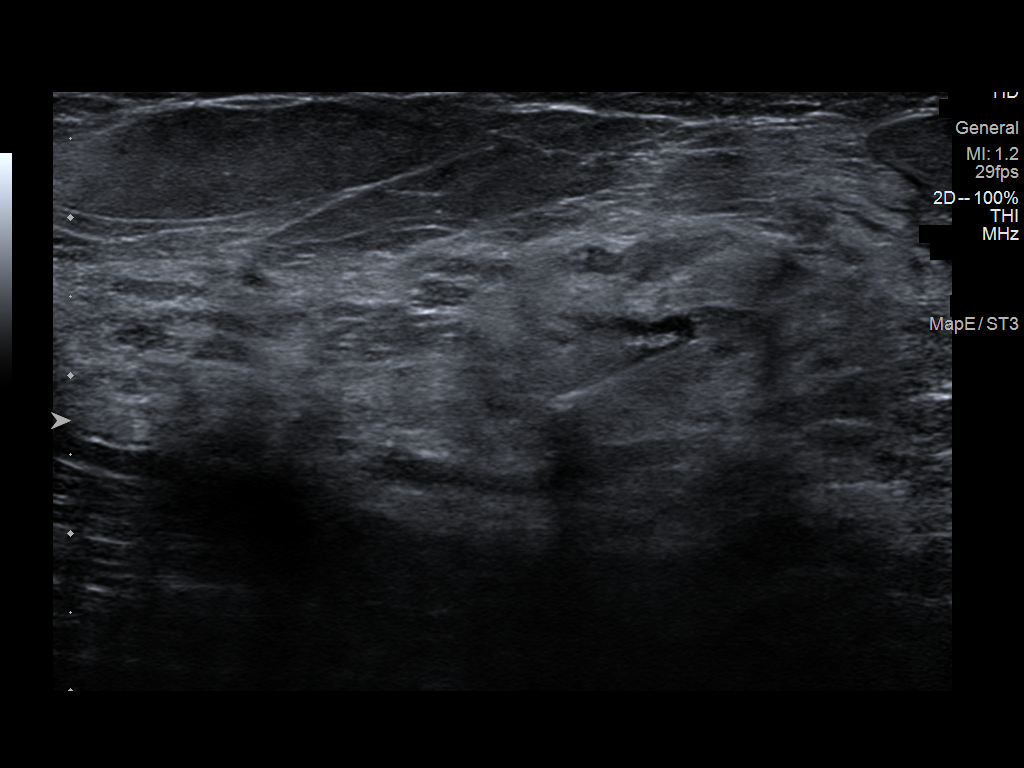

[3 of 3 positions shown; findings below may reference images not displayed]

PROCEDURE:
The patient and I discussed the procedure of ultrasound-guided
aspiration including benefits and alternatives. We discussed the
high likelihood of a successful procedure. We discussed the risks of
the procedure including infection, bleeding, tissue injury, and
inadequate sampling. Informed written consent was given. The usual
time out protocol was performed immediately prior to the procedure.

Using sterile technique, 1% lidocaine, under direct ultrasound
visualization, needle aspiration of a cyst in the right breast at 1
o'clock, 5 cm from was performed. The cyst aspirated to complete
resolution and the minimal aspirate was discarded.
IMPRESSION: Ultrasound-guided aspiration of a cyst in the right breast at 1
o'clock. No apparent complications.

RECOMMENDATIONS:
Bilateral diagnostic mammography and [DATE].

## 2021-03-13 ENCOUNTER — Other Ambulatory Visit: Payer: BC Managed Care – PPO

## 2021-06-14 DIAGNOSIS — Z01419 Encounter for gynecological examination (general) (routine) without abnormal findings: Secondary | ICD-10-CM | POA: Diagnosis not present

## 2021-06-14 DIAGNOSIS — Z130 Encounter for screening for diseases of the blood and blood-forming organs and certain disorders involving the immune mechanism: Secondary | ICD-10-CM | POA: Diagnosis not present

## 2021-06-14 DIAGNOSIS — Z0000 Encounter for general adult medical examination without abnormal findings: Secondary | ICD-10-CM | POA: Diagnosis not present

## 2021-06-14 DIAGNOSIS — Z23 Encounter for immunization: Secondary | ICD-10-CM | POA: Diagnosis not present

## 2021-06-14 DIAGNOSIS — Z124 Encounter for screening for malignant neoplasm of cervix: Secondary | ICD-10-CM | POA: Diagnosis not present

## 2021-06-14 DIAGNOSIS — I10 Essential (primary) hypertension: Secondary | ICD-10-CM | POA: Diagnosis not present

## 2021-06-14 DIAGNOSIS — Z853 Personal history of malignant neoplasm of breast: Secondary | ICD-10-CM | POA: Diagnosis not present

## 2021-07-19 DIAGNOSIS — G43709 Chronic migraine without aura, not intractable, without status migrainosus: Secondary | ICD-10-CM | POA: Diagnosis not present

## 2021-07-19 DIAGNOSIS — I10 Essential (primary) hypertension: Secondary | ICD-10-CM | POA: Diagnosis not present

## 2021-08-19 ENCOUNTER — Other Ambulatory Visit: Payer: Self-pay | Admitting: Oncology

## 2021-09-04 ENCOUNTER — Telehealth: Payer: Self-pay | Admitting: Oncology

## 2021-09-04 NOTE — Telephone Encounter (Signed)
Scheduled per sch msg. Called and left msg  

## 2021-09-13 ENCOUNTER — Inpatient Hospital Stay: Payer: BC Managed Care – PPO

## 2021-09-13 ENCOUNTER — Inpatient Hospital Stay: Payer: BC Managed Care – PPO | Admitting: Oncology

## 2021-09-13 DIAGNOSIS — R0683 Snoring: Secondary | ICD-10-CM | POA: Diagnosis not present

## 2021-09-13 DIAGNOSIS — I10 Essential (primary) hypertension: Secondary | ICD-10-CM | POA: Diagnosis not present

## 2021-09-13 DIAGNOSIS — G43709 Chronic migraine without aura, not intractable, without status migrainosus: Secondary | ICD-10-CM | POA: Diagnosis not present

## 2021-09-20 DIAGNOSIS — R8290 Unspecified abnormal findings in urine: Secondary | ICD-10-CM | POA: Diagnosis not present

## 2021-09-20 DIAGNOSIS — I10 Essential (primary) hypertension: Secondary | ICD-10-CM | POA: Diagnosis not present

## 2021-10-02 DIAGNOSIS — H5203 Hypermetropia, bilateral: Secondary | ICD-10-CM | POA: Diagnosis not present

## 2021-10-31 NOTE — Progress Notes (Addendum)
Sherri Schultz  [phone] Fax:([phone]) [phone]     ID: Sherri Schultz DOB: [birth year]  MR#: [id number]  HWT#:[id number]  Patient Care Team: Vicenta Aly, [place] as PCP - General (Nurse Practitioner) Rockwell Germany, RN as Oncology Nurse Navigator Mauro Kaufmann, RN as Oncology Nurse Navigator Stark Klein, MD as Consulting Physician (General Surgery) , Virgie Dad, MD as Consulting Physician (Oncology) Kyung Rudd, MD as Consulting Physician (Radiation Oncology) Leandrew Koyanagi, MD as Attending Physician (Orthopedic Surgery) Preston Fleeting, FNP (Family Medicine) Chauncey Cruel, MD OTHER MD:   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  She started tamoxifen on 05/29/2020.  She is tolerating this well, with rare hot flashes, no problems with vaginal wetness.  She obtains it at a very good price.  Since her last visit, she underwent bilateral diagnostic mammography with tomography and right breast ultrasonography at [facility] on 03/08/21 showing: breast density category B; indeterminate right breast mass at 1 o'clock, likely representing a complicated cyst; no mammographic evidence of malignancy on the left.  When she returned for aspiration of the cyst on 03/12/2021, the mass was no longer seen on right diagnostic mammogram.   REVIEW OF SYSTEMS: Sherri Schultz continues to have significant headaches.  She is seeing Dr. Lacinda Axon in [place] and is trying out topiramate baclofen and prochlorperazine so far without too much success but they are continuing to look around and see what might work best for her.  I have updated her medication list separately.  She uses a treadmill for exercise about 15 minutes at a time, about 3 days a week.  A detailed review of systems was otherwise stable   COVID 19 VACCINATION  STATUS: [employer] x1, then [employer] x2 as of December 2022   HISTORY OF CURRENT ILLNESS: From the original intake note:  Sherri Schultz had routine screening mammography on 12/01/2019 showing a possible abnormalities in the left breast. She underwent left diagnostic mammography with tomography and left breast ultrasonography at [facility] on 12/08/2019 showing: breast density category C; suspicious 1.5 cm mass involving the lower-inner quadrant of the left breast at 8:30, with a hyperechoic halo surrounding the mass for a total span of 1.8 cm; indeterminate 0.4 cm mass in the upper-inner quadrant of the left breast at 10 o'clock; no pathologic left axillary lymphadenopathy; benign cyst in the upper-outer subareolar left breast.  Accordingly on 12/14/2019 she proceeded to biopsy of the left breast areas in question. The pathology from this procedure ([id number]) showed:  1. Left breast, 8:30   - ductal carcinoma in situ, grade 3, with apocrine features (0.6 cm on biopsy)  - estrogen receptor, 0% negative and progesterone receptor, 0% negative.  2. Left breast, 10 o'clock  - fibroadenomatoid nodule with columnar cells changes  The patient's subsequent history is as detailed below.   PAST MEDICAL HISTORY: Past Medical History:  Diagnosis Date   Anxiety    Breast cancer ([place]) 2021   left breast DCIS   Cancer ([place]) 2013   right knee   Family history of brain cancer    Family history of breast cancer    Family history of colon cancer    Family history of melanoma    Hypertension    Personal history of radiation therapy    Trimalleolar fracture of ankle, closed, left, initial encounter  PAST SURGICAL HISTORY: Past Surgical History:  Procedure Laterality Date   BREAST BIOPSY Right 10+ yrs ago   benign   BREAST BIOPSY Right 01/21/2020   x2   BREAST BIOPSY Left 12/14/2019   x2   BREAST EXCISIONAL BIOPSY Right 02/15/2020   BREAST LUMPECTOMY Left 02/15/2020   BREAST  LUMPECTOMY WITH RADIOACTIVE SEED LOCALIZATION Left 02/15/2020   Procedure: LEFT BREAST LUMPECTOMY WITH RADIOACTIVE SEED LOCALIZATION;  Surgeon: Stark Klein, MD;  Location: [facility];  Service: General;  Laterality: Left;   MELANOMA EXCISION Right 2013   knee   ORIF ANKLE FRACTURE Left 05/06/2018   Procedure: OPEN REDUCTION INTERNAL FIXATION (ORIF) LEFT TRIMALLEOLAR ANKLE FRACTURE;  Surgeon: Leandrew Koyanagi, MD;  Location: [facility];  Service: Orthopedics;  Laterality: Left;   RADIOACTIVE SEED GUIDED EXCISIONAL BREAST BIOPSY Right 02/15/2020   Procedure: RIGHT BREAST RADIOACTIVE SEED LOCALIZATION EXCISIONAL BIOPSY X 2;  Surgeon: Stark Klein, MD;  Location: [facility];  Service: General;  Laterality: Right;    FAMILY HISTORY: Family History  Problem Relation Age of Onset   Breast cancer Mother 83   Diabetes Mother    Colon cancer Mother 72   Breast cancer Maternal Aunt        dx. >50   Diabetes Father    Hypertension Father    Heart attack Father    Arthritis Father    Diabetes Brother    Melanoma Brother 32   Diabetes Brother    Breast cancer Cousin        dx. 40s/50s   Brain cancer Maternal Uncle        dx. 12s   Breast cancer Maternal Aunt        dx. >50   Breast cancer Maternal Aunt        dx. >50   Cancer Maternal Uncle        unknown type, dx. >50   Breast cancer Cousin        dx. 40s/50s  The patient's father died at age 22 from congestive heart failure.  Patient's mother died from breast cancer at age 40, diagnosed age 30.. The patient denies a family hx of ovarian cancer. She reports breast cancer in 2 maternal aunts and 2 maternal first cousins.  The patient has 2 brothers with no history of cancer.   GYNECOLOGIC HISTORY:  Patient's last menstrual period was 03/26/2016. Menarche: 55 years old Sherri Schultz 0 LMP 2017 HRT no  Hysterectomy? no BSO? 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  They mostly have trees in their [place] acres.  Her husband Sherri Schultz (the patient kept her name on her insurance) used to work as a Clinical cytogeneticist for [employer] but is now also retired.  They have no children and no pets.  The patient attends a local [employer]: In the absence of any documents to the contrary the patient's husband is her healthcare power of attorney   HEALTH MAINTENANCE: Social History   Tobacco Use   Smoking status: Never   Smokeless tobacco: Never  Substance Use Topics   Alcohol use: Never   Drug use: Never     Colonoscopy: 2021/[facility]  PAP: 04/2016, negative  Bone density: Never   No Known Allergies  Current Outpatient Medications  Medication Sig Dispense Refill   calcium-vitamin D (OSCAL WITH D) 500-200 MG-UNIT tablet  Take 1 tablet by mouth 3 (three) times daily. 90 tablet 12   estradiol (ESTRACE VAGINAL) 0.1 MG/GM vaginal cream Place 1 Applicatorful vaginally at bedtime. 42.5 g 12   meclizine (ANTIVERT) 25 MG tablet Take 1 tablet by mouth 3 (three) times daily as needed.     propranolol (INDERAL) 40 MG tablet Take 40 mg by mouth daily.     SUMAtriptan (IMITREX) 25 MG tablet May repeat in 2 hours if headache persists or recurs. 10 tablet 0   tamoxifen (NOLVADEX) 20 MG tablet Take 1 tablet by mouth once daily 90 tablet 0   valACYclovir (VALTREX) 1000 MG tablet Take 2 tablets by mouth 2 (two) times daily as needed.     venlafaxine XR (EFFEXOR-XR) 150 MG 24 hr capsule Take 1 capsule by mouth daily.  0   zinc sulfate 220 (50 Zn) MG capsule Take 1 capsule (220 mg total) by mouth daily. 42 capsule 0   zonisamide (ZONEGRAN) 25 MG capsule Take 1 capsule (25 mg total) by mouth daily.     No current facility-administered medications for this visit.    OBJECTIVE: White woman who appears stated age  55:   11/01/21 1008  BP: 121/66  Pulse: 69  Resp: 16  Temp: 97.6 F (36.4 C)  SpO2: 100%      Body  mass index is 27.86 kg/m.   Wt Readings from Last 3 Encounters:  11/01/21 162 lb 4.8 oz (73.6 kg)  09/15/20 170 lb 6.4 oz (77.3 kg)  03/22/20 163 lb 14.4 oz (74.3 kg)     ECOG FS: 1  Sclerae unicteric, EOMs intact Wearing a mask No cervical or supraclavicular adenopathy Lungs no rales or rhonchi Heart regular rate and rhythm Abd soft, nontender, positive bowel sounds MSK no focal spinal tenderness, no upper extremity lymphedema Neuro: nonfocal, well oriented, appears stressed Breasts: The right breast is unremarkable.  The left breast is status postlumpectomy and radiation.  There is no evidence of local recurrence.  Both axillae are benign   LAB RESULTS:  CMP     Component Value Date/Time   NA 141 11/01/2021 0946   K 3.9 11/01/2021 0946   CL 117 (H) 11/01/2021 0946   CO2 16 (L) 11/01/2021 0946   GLUCOSE 100 (H) 11/01/2021 0946   BUN 10 11/01/2021 0946   CREATININE 0.87 11/01/2021 0946   CREATININE 0.90 12/22/2019 0827   CALCIUM 8.4 (L) 11/01/2021 0946   PROT 7.1 11/01/2021 0946   ALBUMIN 4.0 11/01/2021 0946   AST 14 (L) 11/01/2021 0946   AST 18 12/22/2019 0827   ALT 10 11/01/2021 0946   ALT 16 12/22/2019 0827   ALKPHOS 67 11/01/2021 0946   BILITOT 0.4 11/01/2021 0946   BILITOT 0.3 12/22/2019 0827   GFRNONAA >60 11/01/2021 0946   GFRNONAA >60 12/22/2019 0827   GFRAA >60 12/22/2019 0827    No results found for: TOTALPROTELP, ALBUMINELP, A1GS, A2GS, BETS, BETA2SER, GAMS, MSPIKE, SPEI  Lab Results  Component Value Date   WBC 4.9 11/01/2021   NEUTROABS 2.7 11/01/2021   HGB 12.1 11/01/2021   HCT 35.9 (L) 11/01/2021   MCV 92.5 11/01/2021   PLT 259 11/01/2021    No results found for: LABCA2  No components found for: TJQZES923  No results for input(s): INR in the last 168 hours.  No results found for: LABCA2  No results found for: RAQ762  No results found for: UQJ335  No results found for: KTG256  No results found for: CA2729  No  components found  for: HGQUANT  No results found for: CEA1 / No results found for: CEA1   No results found for: AFPTUMOR  No results found for: CHROMOGRNA  No results found for: KPAFRELGTCHN, LAMBDASER, KAPLAMBRATIO (kappa/lambda light chains)  No results found for: HGBA, HGBA2QUANT, HGBFQUANT, HGBSQUAN (Hemoglobinopathy evaluation)   No results found for: LDH  No results found for: IRON, TIBC, IRONPCTSAT (Iron and TIBC)  No results found for: FERRITIN  Urinalysis No results found for: COLORURINE, APPEARANCEUR, LABSPEC, PHURINE, GLUCOSEU, HGBUR, BILIRUBINUR, KETONESUR, PROTEINUR, UROBILINOGEN, NITRITE, LEUKOCYTESUR   STUDIES: No results found.   ELIGIBLE FOR AVAILABLE RESEARCH PROTOCOL: No  ASSESSMENT: 55 y.o. [place] woman status post left breast biopsy 12/14/2019 for ductal carcinoma in situ, clinically 1.8 cm, grade 3, estrogen and progesterone receptor negative  (1) genetics testing 01/01/2020 through the STAT Breast cancer panel offered by [facility] found no deleterious mutations in  ATM, BRCA1, BRCA2, CDH1, CHEK2, PALB2, PTEN, STK11 and TP53.  The Multi-Cancer Panel offered by [facility] includes also found no deleterious mutations inAIP, ALK, APC, ATM, AXIN2,BAP1,  BARD1, BLM, BMPR1A, BRCA1, BRCA2, BRIP1, CASR, CDC73, CDH1, CDK4, CDKN1B, CDKN1C, CDKN2A (p14ARF), CDKN2A (p16INK4a), CEBPA, CHEK2, CTNNA1, DICER1, DIS3L2, EGFR (c.2369C>T, Schultz.Thr790Met variant only), EPCAM (Deletion/duplication testing only), FH, FLCN, GATA2, GPC3, GREM1 (Promoter region deletion/duplication testing only), HOXB13 (c.251G>A, Schultz.Gly84Glu), HRAS, KIT, MAX, MEN1, MET, MITF (c.952G>A, Schultz.Glu318Lys variant only), MLH1, MSH2, MSH3, MSH6, MUTYH, NBN, NF1, NF2, NTHL1, PALB2, PDGFRA, PHOX2B, PMS2, POLD1, POLE, POT1, PRKAR1A, PTCH1, PTEN, RAD50, RAD51C, RAD51D, RB1, RECQL4, RET, RNF43, RUNX1, SDHAF2, SDHA (sequence changes only), SDHB, SDHC, SDHD, SMAD4, SMARCA4, SMARCB1, SMARCE1, STK11, SUFU, TERC, TERT, TMEM127, TP53, TSC1,  TSC2, VHL, WRN and WT1.     (a) a variant of  uncertain significance was detected in the MSH3 gene called c.230_232dup (Schultz.Pro77dup).   (2) status post bilateral lumpectomies 02/15/2020, showing  (a) on the right, no evidence of malignancy (radial scar, fibroadenoma).  (b) on the left, ductal carcinoma in situ, grade 3, measuring 1.4 cm, with negative margins  (3) adjuvant radiation 03/20/2020 - 05/04/2020  (a) left breast / 50.4 Gy in 28 fractions  (b) seroma boost / 10 Gy in 5 fractions  (4) started tamoxifen 05/29/2020   PLAN: Cissy is now a little over a year and a half out from definitive surgery for her breast cancer with no evidence of disease recurrence.  This is favorable.  She is tolerating tamoxifen well and the plan is to continue that a total of 5 years.  I am hopeful she will make some progress on her headaches.  Since her breast cancer was not invasive it is not going to be breast cancer metastatic to the brain.  There are several new options now for migraine treatment and I am sure they will explore them until she gets the relief she needs  Otherwise she will have her next mammogram in April and she will return to see [place] in May 2023.  At that point we will start yearly follow-up  Total encounter time 25 minutes.Chauncey Cruel, MD   11/01/2021 10:50 AM Medical Oncology and Hematology [facility] [place], [place] [address] Tel. [phone]    Fax. [phone]   This document serves as a record of services personally performed by Lurline Del, MD. It was created on his behalf by Wilburn Mylar, a trained medical scribe. The creation of this record is based on the scribe's personal observations and the provider's statements to  them.   I, Lurline Del MD, have reviewed the above documentation for accuracy and completeness, and I agree with the above.   *Total Encounter Time as defined by the Centers for Medicare and Medicaid  Services includes, in addition to the face-to-face time of a patient visit (documented in the note above) non-face-to-face time: obtaining and reviewing outside history, ordering and reviewing medications, tests or procedures, care coordination (communications with other health care professionals or caregivers) and documentation in the medical record.

## 2021-11-01 ENCOUNTER — Other Ambulatory Visit: Payer: Self-pay

## 2021-11-01 ENCOUNTER — Inpatient Hospital Stay: Payer: BC Managed Care – PPO | Attending: Oncology

## 2021-11-01 ENCOUNTER — Inpatient Hospital Stay: Payer: BC Managed Care – PPO | Admitting: Oncology

## 2021-11-01 VITALS — BP 121/66 | HR 69 | Temp 97.6°F | Resp 16 | Ht 64.0 in | Wt 162.3 lb

## 2021-11-01 DIAGNOSIS — R51 Headache with orthostatic component, not elsewhere classified: Secondary | ICD-10-CM | POA: Diagnosis not present

## 2021-11-01 DIAGNOSIS — Z79899 Other long term (current) drug therapy: Secondary | ICD-10-CM | POA: Insufficient documentation

## 2021-11-01 DIAGNOSIS — D0512 Intraductal carcinoma in situ of left breast: Secondary | ICD-10-CM | POA: Insufficient documentation

## 2021-11-01 DIAGNOSIS — Z79810 Long term (current) use of selective estrogen receptor modulators (SERMs): Secondary | ICD-10-CM | POA: Diagnosis not present

## 2021-11-01 DIAGNOSIS — Z803 Family history of malignant neoplasm of breast: Secondary | ICD-10-CM | POA: Diagnosis not present

## 2021-11-01 DIAGNOSIS — Z171 Estrogen receptor negative status [ER-]: Secondary | ICD-10-CM | POA: Insufficient documentation

## 2021-11-01 DIAGNOSIS — Z808 Family history of malignant neoplasm of other organs or systems: Secondary | ICD-10-CM | POA: Diagnosis not present

## 2021-11-01 DIAGNOSIS — Z923 Personal history of irradiation: Secondary | ICD-10-CM | POA: Insufficient documentation

## 2021-11-01 DIAGNOSIS — I10 Essential (primary) hypertension: Secondary | ICD-10-CM | POA: Diagnosis not present

## 2021-11-01 DIAGNOSIS — Z800 Family history of malignant neoplasm of digestive organs: Secondary | ICD-10-CM | POA: Insufficient documentation

## 2021-11-01 DIAGNOSIS — C4371 Malignant melanoma of right lower limb, including hip: Secondary | ICD-10-CM

## 2021-11-01 LAB — CBC WITH DIFFERENTIAL/PLATELET
Abs Immature Granulocytes: 0.01 10*3/uL (ref 0.00–0.07)
Basophils Absolute: 0 10*3/uL (ref 0.0–0.1)
Basophils Relative: 0 %
Eosinophils Absolute: 0.1 10*3/uL (ref 0.0–0.5)
Eosinophils Relative: 1 %
HCT: 35.9 % — ABNORMAL LOW (ref 36.0–46.0)
Hemoglobin: 12.1 g/dL (ref 12.0–15.0)
Immature Granulocytes: 0 %
Lymphocytes Relative: 37 %
Lymphs Abs: 1.8 10*3/uL (ref 0.7–4.0)
MCH: 31.2 pg (ref 26.0–34.0)
MCHC: 33.7 g/dL (ref 30.0–36.0)
MCV: 92.5 fL (ref 80.0–100.0)
Monocytes Absolute: 0.3 10*3/uL (ref 0.1–1.0)
Monocytes Relative: 7 %
Neutro Abs: 2.7 10*3/uL (ref 1.7–7.7)
Neutrophils Relative %: 55 %
Platelets: 259 10*3/uL (ref 150–400)
RBC: 3.88 MIL/uL (ref 3.87–5.11)
RDW: 13.4 % (ref 11.5–15.5)
WBC: 4.9 10*3/uL (ref 4.0–10.5)
nRBC: 0 % (ref 0.0–0.2)

## 2021-11-01 LAB — COMPREHENSIVE METABOLIC PANEL
ALT: 10 U/L (ref 0–44)
AST: 14 U/L — ABNORMAL LOW (ref 15–41)
Albumin: 4 g/dL (ref 3.5–5.0)
Alkaline Phosphatase: 67 U/L (ref 38–126)
Anion gap: 8 (ref 5–15)
BUN: 10 mg/dL (ref 6–20)
CO2: 16 mmol/L — ABNORMAL LOW (ref 22–32)
Calcium: 8.4 mg/dL — ABNORMAL LOW (ref 8.9–10.3)
Chloride: 117 mmol/L — ABNORMAL HIGH (ref 98–111)
Creatinine, Ser: 0.87 mg/dL (ref 0.44–1.00)
GFR, Estimated: >60 mL/min (ref >60)
Glucose, Bld: 100 mg/dL — ABNORMAL HIGH (ref 70–99)
Potassium: 3.9 mmol/L (ref 3.5–5.1)
Sodium: 141 mmol/L (ref 135–145)
Total Bilirubin: 0.4 mg/dL (ref 0.3–1.2)
Total Protein: 7.1 g/dL (ref 6.5–8.1)

## 2021-11-01 NOTE — Addendum Note (Signed)
Addended by: Al Pimple on: 11/01/2021 11:10 AM   Modules accepted: Orders

## 2022-01-14 DIAGNOSIS — I10 Essential (primary) hypertension: Secondary | ICD-10-CM | POA: Diagnosis not present

## 2022-01-14 DIAGNOSIS — G43719 Chronic migraine without aura, intractable, without status migrainosus: Secondary | ICD-10-CM | POA: Diagnosis not present

## 2022-01-30 ENCOUNTER — Telehealth: Payer: Self-pay | Admitting: Hematology and Oncology

## 2022-01-30 NOTE — Telephone Encounter (Signed)
Rescheduled appointment per provders template. Left message ?

## 2022-02-18 DIAGNOSIS — G43719 Chronic migraine without aura, intractable, without status migrainosus: Secondary | ICD-10-CM | POA: Diagnosis not present

## 2022-02-20 DIAGNOSIS — G43719 Chronic migraine without aura, intractable, without status migrainosus: Secondary | ICD-10-CM | POA: Diagnosis not present

## 2022-04-01 ENCOUNTER — Other Ambulatory Visit: Payer: Self-pay | Admitting: Nurse Practitioner

## 2022-04-01 DIAGNOSIS — Z1231 Encounter for screening mammogram for malignant neoplasm of breast: Secondary | ICD-10-CM

## 2022-04-04 ENCOUNTER — Other Ambulatory Visit: Payer: Self-pay | Admitting: Nurse Practitioner

## 2022-04-04 ENCOUNTER — Other Ambulatory Visit: Payer: Self-pay | Admitting: Hematology and Oncology

## 2022-04-04 ENCOUNTER — Ambulatory Visit
Admission: RE | Admit: 2022-04-04 | Discharge: 2022-04-04 | Disposition: A | Discharge status: Home / Self Care | Payer: BC Managed Care – PPO | Source: Ambulatory Visit | Attending: Nurse Practitioner | Admitting: Nurse Practitioner

## 2022-04-04 DIAGNOSIS — Z9889 Other specified postprocedural states: Secondary | ICD-10-CM

## 2022-04-04 DIAGNOSIS — Z1231 Encounter for screening mammogram for malignant neoplasm of breast: Secondary | ICD-10-CM

## 2022-04-08 ENCOUNTER — Ambulatory Visit
Admission: RE | Admit: 2022-04-08 | Discharge: 2022-04-08 | Disposition: A | Discharge status: Home / Self Care | Payer: BC Managed Care – PPO | Source: Ambulatory Visit | Attending: Nurse Practitioner | Admitting: Nurse Practitioner

## 2022-04-08 DIAGNOSIS — R928 Other abnormal and inconclusive findings on diagnostic imaging of breast: Secondary | ICD-10-CM | POA: Diagnosis not present

## 2022-04-08 DIAGNOSIS — Z853 Personal history of malignant neoplasm of breast: Secondary | ICD-10-CM | POA: Diagnosis not present

## 2022-04-08 DIAGNOSIS — Z9889 Other specified postprocedural states: Secondary | ICD-10-CM

## 2022-04-08 IMAGING — MG DIGITAL DIAGNOSTIC BILAT W/ TOMO W/ CAD
6 of 9 series · 6 of 25 positions shown · non-contrast
Comparison: Previous exam(s).

CLINICAL DATA: Left lumpectomy.  Annual mammography.

EXAM:
DIGITAL DIAGNOSTIC BILATERAL MAMMOGRAM WITH TOMOSYNTHESIS AND CAD
TECHNIQUE: Bilateral digital diagnostic mammography and breast tomosynthesis
was performed. The images were evaluated with computer-aided
detection.

[L CC]
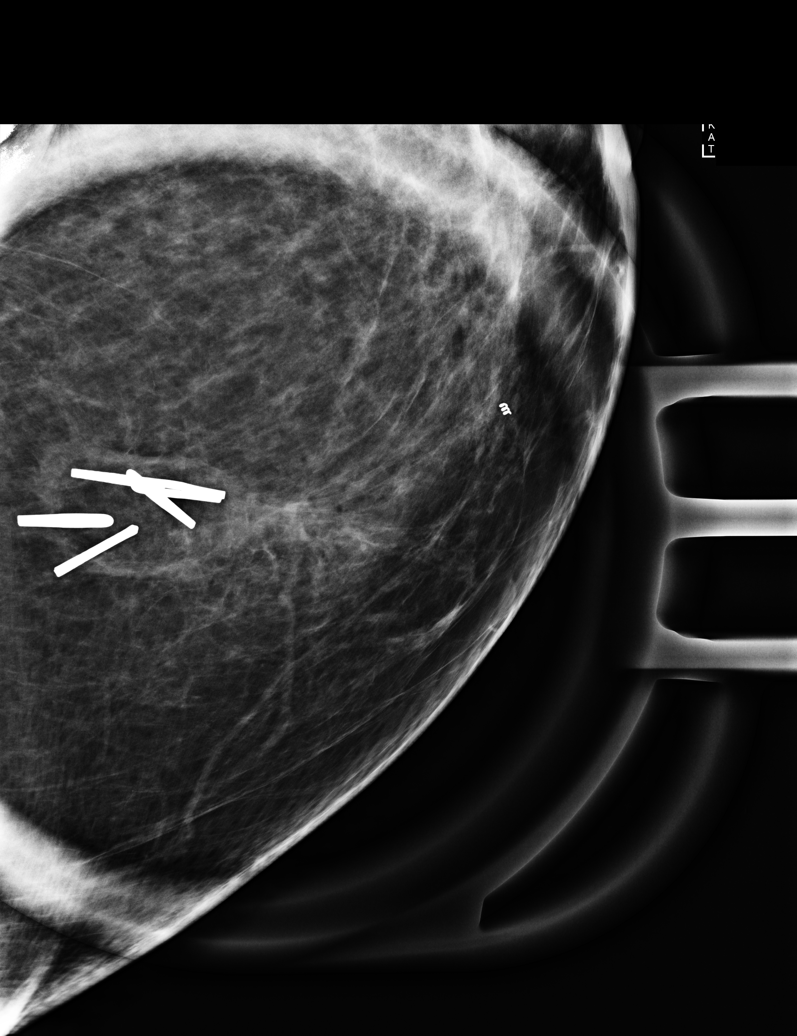

[L MLO synth-2D]
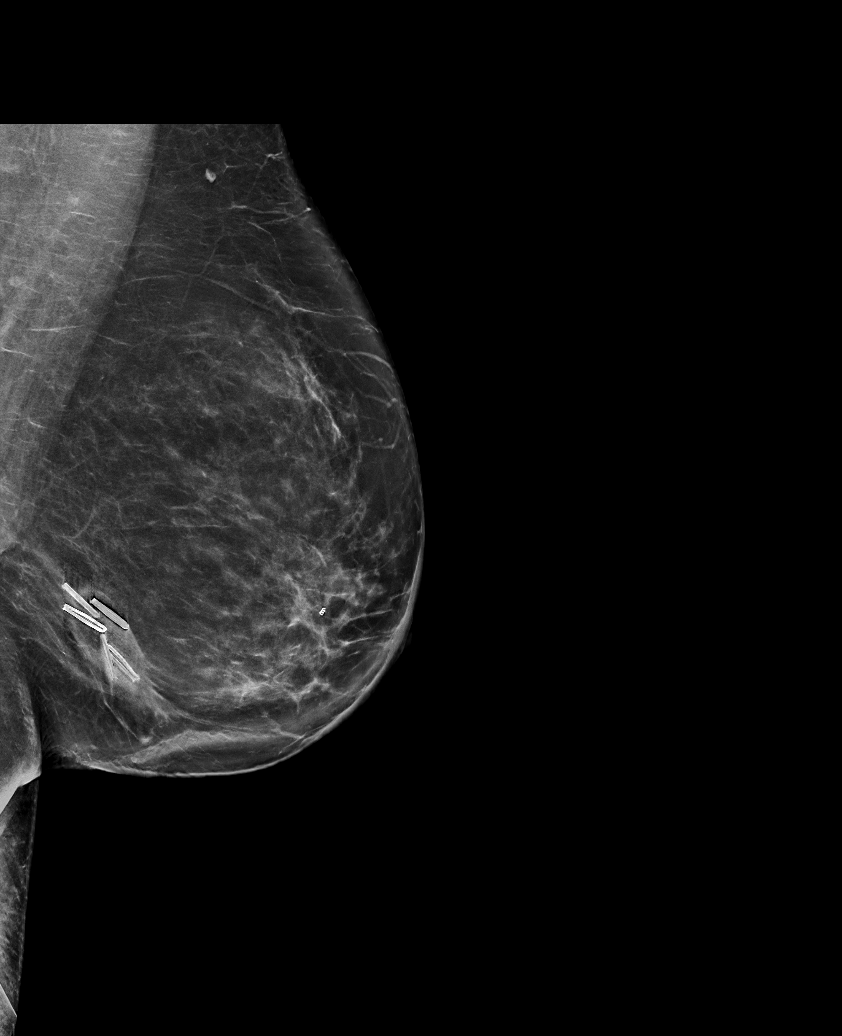

[R CC synth-2D]
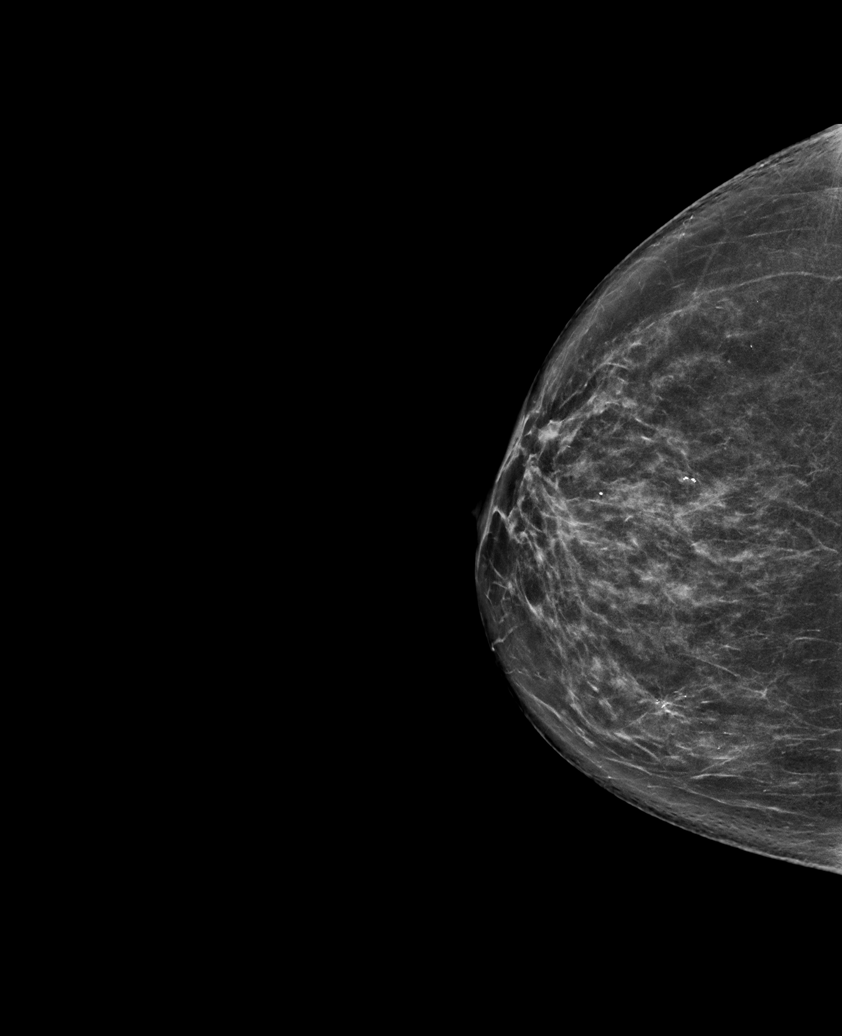

[L CC synth-2D]
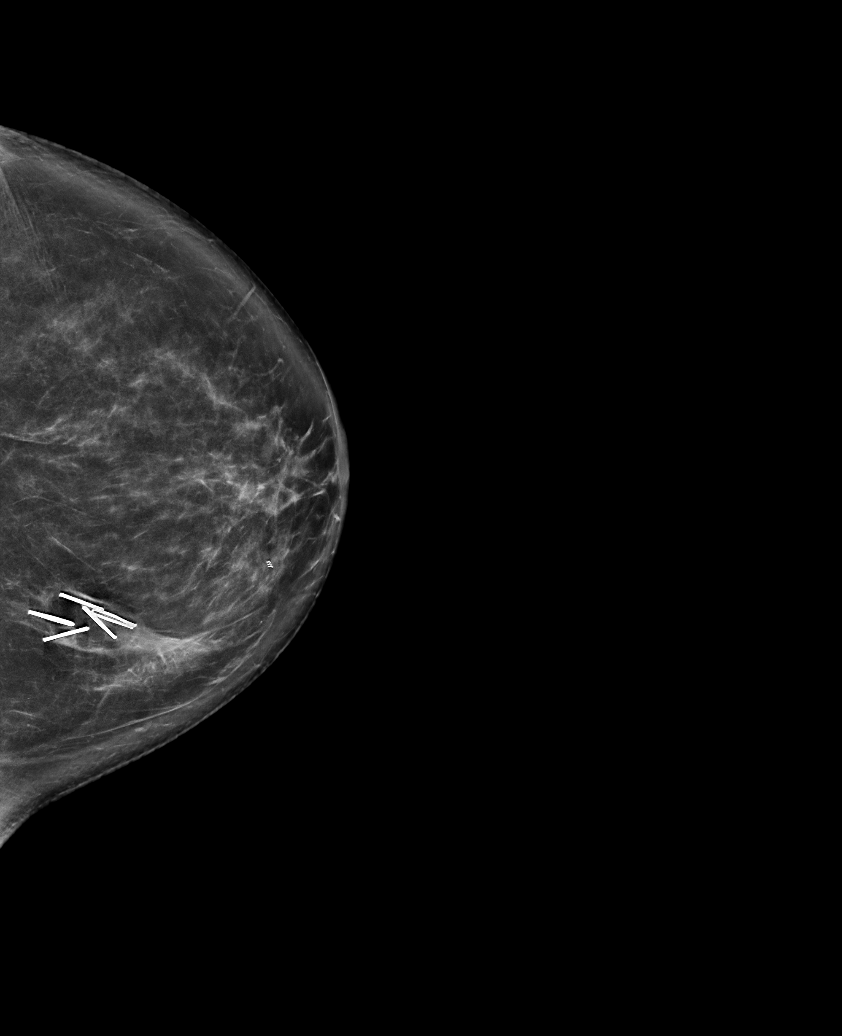

[R MLO synth-2D]
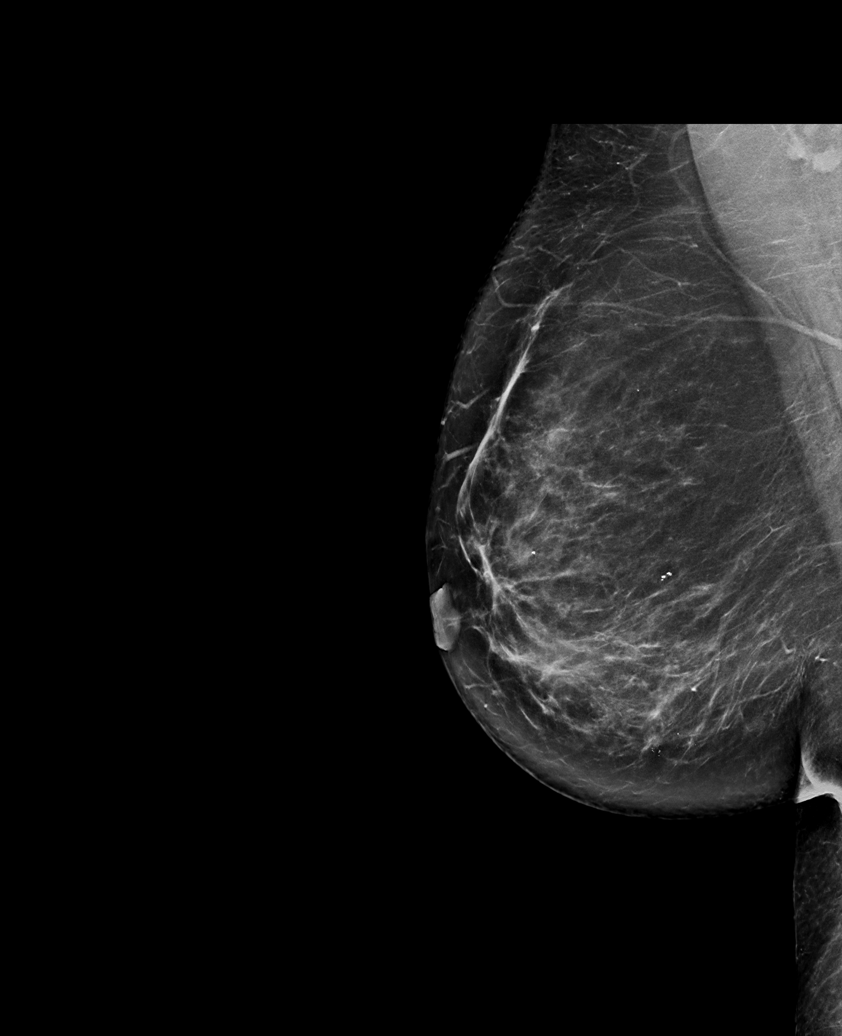

[R CC tomo · tomo slice 39/77.0]
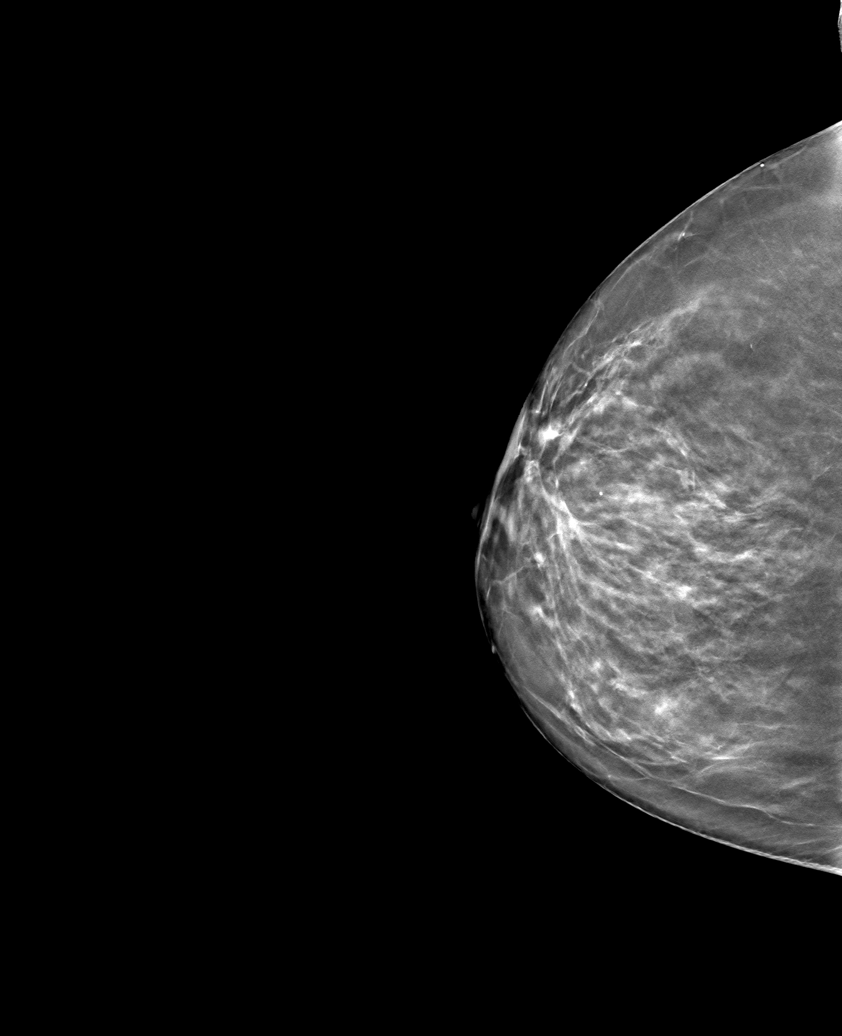

[6 of 25 positions shown; findings below may reference images not displayed]

ACR Breast Density Category b: There are scattered areas of
fibroglandular density.
FINDINGS: Left lumpectomy site is stable. No new or suspicious findings
identified in either breast.
IMPRESSION: No mammographic evidence of malignancy.

RECOMMENDATION:
Per protocol, as the patient is now 2 or more years status post
lumpectomy, she may return to annual screening mammography in 1
year. However, given the history of breast cancer, the patient
remains eligible for annual diagnostic mammography if preferred.

I have discussed the findings and recommendations with the patient.
If applicable, a reminder letter will be sent to the patient
regarding the next appointment.

BI-RADS CATEGORY  2: Benign.

## 2022-04-10 ENCOUNTER — Other Ambulatory Visit: Payer: BC Managed Care – PPO

## 2022-04-10 ENCOUNTER — Other Ambulatory Visit: Payer: Self-pay

## 2022-04-10 ENCOUNTER — Ambulatory Visit: Payer: BC Managed Care – PPO | Admitting: Hematology and Oncology

## 2022-04-10 DIAGNOSIS — C4371 Malignant melanoma of right lower limb, including hip: Secondary | ICD-10-CM

## 2022-04-10 DIAGNOSIS — D0512 Intraductal carcinoma in situ of left breast: Secondary | ICD-10-CM

## 2022-04-12 ENCOUNTER — Inpatient Hospital Stay: Payer: BC Managed Care – PPO | Admitting: Hematology and Oncology

## 2022-04-12 ENCOUNTER — Inpatient Hospital Stay: Payer: BC Managed Care – PPO | Attending: Hematology and Oncology

## 2022-04-12 ENCOUNTER — Other Ambulatory Visit: Payer: Self-pay

## 2022-04-12 VITALS — BP 115/75 | HR 75 | Temp 97.7°F | Resp 16 | Ht 64.0 in | Wt 161.6 lb

## 2022-04-12 DIAGNOSIS — R519 Headache, unspecified: Secondary | ICD-10-CM | POA: Diagnosis not present

## 2022-04-12 DIAGNOSIS — D0512 Intraductal carcinoma in situ of left breast: Secondary | ICD-10-CM | POA: Diagnosis not present

## 2022-04-12 DIAGNOSIS — Z923 Personal history of irradiation: Secondary | ICD-10-CM | POA: Diagnosis not present

## 2022-04-12 LAB — CBC WITH DIFFERENTIAL (CANCER CENTER ONLY)
Abs Immature Granulocytes: 0.01 10*3/uL (ref 0.00–0.07)
Basophils Absolute: 0 10*3/uL (ref 0.0–0.1)
Basophils Relative: 0 %
Eosinophils Absolute: 0.1 10*3/uL (ref 0.0–0.5)
Eosinophils Relative: 2 %
HCT: 37.4 % (ref 36.0–46.0)
Hemoglobin: 12.4 g/dL (ref 12.0–15.0)
Immature Granulocytes: 0 %
Lymphocytes Relative: 35 %
Lymphs Abs: 1.8 10*3/uL (ref 0.7–4.0)
MCH: 31.6 pg (ref 26.0–34.0)
MCHC: 33.2 g/dL (ref 30.0–36.0)
MCV: 95.2 fL (ref 80.0–100.0)
Monocytes Absolute: 0.4 10*3/uL (ref 0.1–1.0)
Monocytes Relative: 9 %
Neutro Abs: 2.8 10*3/uL (ref 1.7–7.7)
Neutrophils Relative %: 54 %
Platelet Count: 274 10*3/uL (ref 150–400)
RBC: 3.93 MIL/uL (ref 3.87–5.11)
RDW: 13.2 % (ref 11.5–15.5)
WBC Count: 5.1 10*3/uL (ref 4.0–10.5)
nRBC: 0 % (ref 0.0–0.2)

## 2022-04-12 LAB — CMP (CANCER CENTER ONLY)
ALT: 13 U/L (ref 0–44)
AST: 15 U/L (ref 15–41)
Albumin: 4.2 g/dL (ref 3.5–5.0)
Alkaline Phosphatase: 83 U/L (ref 38–126)
Anion gap: 5 (ref 5–15)
BUN: 12 mg/dL (ref 6–20)
CO2: 23 mmol/L (ref 22–32)
Calcium: 9 mg/dL (ref 8.9–10.3)
Chloride: 114 mmol/L — ABNORMAL HIGH (ref 98–111)
Creatinine: 0.98 mg/dL (ref 0.44–1.00)
GFR, Estimated: >60 mL/min (ref >60)
Glucose, Bld: 101 mg/dL — ABNORMAL HIGH (ref 70–99)
Potassium: 4.5 mmol/L (ref 3.5–5.1)
Sodium: 142 mmol/L (ref 135–145)
Total Bilirubin: 0.4 mg/dL (ref 0.3–1.2)
Total Protein: 7.3 g/dL (ref 6.5–8.1)

## 2022-04-12 MED ORDER — TAMOXIFEN CITRATE 20 MG PO TABS: 20.0000 mg | ORAL_TABLET | Freq: Every day | ORAL | 3 refills | Status: DC

## 2022-04-12 NOTE — Progress Notes (Signed)
[place]  [phone] Fax:([phone]) [phone]     ID: Sherri Schultz DOB: 21-Nov-1966  MR#: [id number]  KMM#:[id number]  Patient Care Team: Vicenta Aly, [place] as PCP - General (Nurse Practitioner) Rockwell Germany, RN as Oncology Nurse Navigator Mauro Kaufmann, RN as Oncology Nurse Navigator Stark Klein, MD as Consulting Physician (General Surgery) Magrinat, Virgie Dad, MD (Inactive) as Consulting Physician (Oncology) Kyung Rudd, MD as Consulting Physician (Radiation Oncology) Leandrew Koyanagi, MD as Attending Physician (Orthopedic Surgery) Preston Fleeting, FNP (Family Medicine) Benay Pike, MD OTHER MD:   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  She started tamoxifen on 05/29/2020.  She is tolerating this well, with rare hot flashes, no problems with vaginal wetness.   She continues to deal with chronic headaches, Headaches are bi frontal, all day everyday going on for years Last botox injection in March. Last mammogram May 15, BI RADS 2.   Rest of the pertinent 10 point ROS reviewed and neg   COVID 19 VACCINATION STATUS: [employer] x1, then [employer] x2 as of December 2022   HISTORY OF CURRENT ILLNESS: From the original intake note:  Sherri Schultz had routine screening mammography on 12/01/2019 showing a possible abnormalities in the left breast. She underwent left diagnostic mammography with tomography and left breast ultrasonography at [facility] on 12/08/2019 showing: breast density category C; suspicious 1.5 cm mass involving the lower-inner quadrant of the left breast at 8:30, with a hyperechoic halo surrounding the mass for a total span of 1.8 cm; indeterminate 0.4 cm mass in the upper-inner quadrant of the left breast at 10 o'clock; no pathologic left axillary lymphadenopathy; benign  cyst in the upper-outer subareolar left breast.  Accordingly on 12/14/2019 she proceeded to biopsy of the left breast areas in question. The pathology from this procedure ([id number]) showed:  1. Left breast, 8:30   - ductal carcinoma in situ, grade 3, with apocrine features (0.6 cm on biopsy)  - estrogen receptor, 0% negative and progesterone receptor, 0% negative.  2. Left breast, 10 o'clock  - fibroadenomatoid nodule with columnar cells changes  The patient's subsequent history is as detailed below.   PAST MEDICAL HISTORY: Past Medical History:  Diagnosis Date   Anxiety    Breast cancer ([place]) 2021   left breast DCIS   Cancer ([place]) 2013   right knee   Family history of brain cancer    Family history of breast cancer    Family history of colon cancer    Family history of melanoma    Hypertension    Personal history of radiation therapy    Trimalleolar fracture of ankle, closed, left, initial encounter     PAST SURGICAL HISTORY: Past Surgical History:  Procedure Laterality Date   BREAST BIOPSY Right 10+ yrs ago   benign   BREAST BIOPSY Right 01/21/2020   x2   BREAST BIOPSY Left 12/14/2019   x2   BREAST CYST ASPIRATION Right 03/12/2021   BREAST EXCISIONAL BIOPSY Right 02/15/2020   BREAST LUMPECTOMY Left 02/15/2020   BREAST LUMPECTOMY WITH RADIOACTIVE SEED LOCALIZATION Left 02/15/2020   Procedure: LEFT BREAST LUMPECTOMY WITH RADIOACTIVE SEED LOCALIZATION;  Surgeon: Stark Klein, MD;  Location: [facility];  Service: General;  Laterality: Left;   MELANOMA EXCISION Right 2013   knee   ORIF ANKLE FRACTURE Left 05/06/2018  Procedure: OPEN REDUCTION INTERNAL FIXATION (ORIF) LEFT TRIMALLEOLAR ANKLE FRACTURE;  Surgeon: Leandrew Koyanagi, MD;  Location: [facility];  Service: Orthopedics;  Laterality: Left;   RADIOACTIVE SEED GUIDED EXCISIONAL BREAST BIOPSY Right 02/15/2020   Procedure: RIGHT BREAST RADIOACTIVE SEED LOCALIZATION EXCISIONAL BIOPSY X 2;   Surgeon: Stark Klein, MD;  Location: [facility];  Service: General;  Laterality: Right;    FAMILY HISTORY: Family History  Problem Relation Age of Onset   Breast cancer Mother 31   Diabetes Mother    Colon cancer Mother 63   Breast cancer Maternal Aunt        dx. >50   Diabetes Father    Hypertension Father    Heart attack Father    Arthritis Father    Diabetes Brother    Melanoma Brother 17   Diabetes Brother    Breast cancer Cousin        dx. 40s/50s   Brain cancer Maternal Uncle        dx. 77s   Breast cancer Maternal Aunt        dx. >50   Breast cancer Maternal Aunt        dx. >50   Cancer Maternal Uncle        unknown type, dx. >50   Breast cancer Cousin        dx. 40s/50s  The patient's father died at age 62 from congestive heart failure.  Patient's mother died from breast cancer at age 51, diagnosed age 21.. The patient denies a family hx of ovarian cancer. She reports breast cancer in 2 maternal aunts and 2 maternal first cousins.  The patient has 2 brothers with no history of cancer.   GYNECOLOGIC HISTORY:  Patient's last menstrual period was 03/26/2016. Menarche: 56 years old [place] P 0 LMP 2017 HRT no  Hysterectomy? no BSO? 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  They mostly have trees in their [place] acres.  Her husband Sherri Schultz (the patient kept her name on her insurance) used to work as a Clinical cytogeneticist for [employer] but is now also retired.  They have no children and no pets.  The patient attends a local [place]: In the absence of any documents to the contrary the patient's husband is her healthcare power of attorney   HEALTH MAINTENANCE: Social History   Tobacco Use   Smoking status: Never   Smokeless tobacco: Never  Substance Use Topics   Alcohol use: Never   Drug use: Never     Colonoscopy: 2021/[facility]  PAP: 04/2016,  negative  Bone density: Never   No Known Allergies  Current Outpatient Medications  Medication Sig Dispense Refill   baclofen (LIORESAL) 10 MG tablet Take 10 mg by mouth 3 (three) times daily.     meclizine (ANTIVERT) 25 MG tablet Take 1 tablet by mouth 3 (three) times daily as needed.     prochlorperazine (COMPAZINE) 10 MG tablet Take 10 mg by mouth every 6 (six) hours as needed for nausea or vomiting.     propranolol (INDERAL) 40 MG tablet Take 40 mg by mouth daily.     rosuvastatin (CRESTOR) 10 MG tablet Take 10 mg by mouth daily.     SUMAtriptan (IMITREX) 25 MG tablet May repeat in 2 hours if headache persists or recurs. 10 tablet 0   tamoxifen (NOLVADEX) 20  MG tablet Take 1 tablet by mouth once daily 90 tablet 0   topiramate (TOPAMAX) 100 MG tablet Take 100 mg by mouth.     valACYclovir (VALTREX) 1000 MG tablet Take 2 tablets by mouth 2 (two) times daily as needed.     venlafaxine XR (EFFEXOR-XR) 150 MG 24 hr capsule Take 1 capsule by mouth daily.  0   No current facility-administered medications for this visit.    OBJECTIVE: White woman who appears stated age  33:   04/12/22 0912  BP: 115/75  Pulse: 75  Resp: 16  Temp: 97.7 F (36.5 C)  SpO2: 100%      Body mass index is 27.74 kg/m.   Wt Readings from Last 3 Encounters:  04/12/22 161 lb 9.6 oz (73.3 kg)  11/01/21 162 lb 4.8 oz (73.6 kg)  09/15/20 170 lb 6.4 oz (77.3 kg)     ECOG FS: 1  Physical Exam Chest:       Comments: Bilateral breasts inspected. No palpable masses or regional adenopathy Musculoskeletal:     Cervical back: Normal range of motion and neck supple. No rigidity.  Lymphadenopathy:     Cervical: No cervical adenopathy.    LAB RESULTS:  CMP     Component Value Date/Time   NA 141 11/01/2021 0946   K 3.9 11/01/2021 0946   CL 117 (H) 11/01/2021 0946   CO2 16 (L) 11/01/2021 0946   GLUCOSE 100 (H) 11/01/2021 0946   BUN 10 11/01/2021 0946   CREATININE 0.87 11/01/2021 0946   CREATININE  0.90 12/22/2019 0827   CALCIUM 8.4 (L) 11/01/2021 0946   PROT 7.1 11/01/2021 0946   ALBUMIN 4.0 11/01/2021 0946   AST 14 (L) 11/01/2021 0946   AST 18 12/22/2019 0827   ALT 10 11/01/2021 0946   ALT 16 12/22/2019 0827   ALKPHOS 67 11/01/2021 0946   BILITOT 0.4 11/01/2021 0946   BILITOT 0.3 12/22/2019 0827   GFRNONAA >60 11/01/2021 0946   GFRNONAA >60 12/22/2019 0827   GFRAA >60 12/22/2019 0827    No results found for: TOTALPROTELP, ALBUMINELP, A1GS, A2GS, BETS, BETA2SER, GAMS, MSPIKE, SPEI  Lab Results  Component Value Date   WBC 5.1 04/12/2022   NEUTROABS 2.8 04/12/2022   HGB 12.4 04/12/2022   HCT 37.4 04/12/2022   MCV 95.2 04/12/2022   PLT 274 04/12/2022    No results found for: LABCA2  No components found for: [id number]  No results for input(s): INR in the last 168 hours.  No results found for: LABCA2  No results found for: XNT700  No results found for: FVC944  No results found for: HQP591  No results found for: CA2729  No components found for: HGQUANT  No results found for: CEA1 / No results found for: CEA1   No results found for: AFPTUMOR  No results found for: CHROMOGRNA  No results found for: KPAFRELGTCHN, LAMBDASER, KAPLAMBRATIO (kappa/lambda light chains)  No results found for: HGBA, HGBA2QUANT, HGBFQUANT, HGBSQUAN (Hemoglobinopathy evaluation)   No results found for: LDH  No results found for: IRON, TIBC, IRONPCTSAT (Iron and TIBC)  No results found for: FERRITIN  Urinalysis No results found for: COLORURINE, APPEARANCEUR, LABSPEC, PHURINE, GLUCOSEU, HGBUR, BILIRUBINUR, KETONESUR, PROTEINUR, UROBILINOGEN, NITRITE, LEUKOCYTESUR   STUDIES: MM DIAG BREAST TOMO BILATERAL  Result Date: 04/08/2022 CLINICAL DATA:  Left lumpectomy.  Annual mammography. EXAM: DIGITAL DIAGNOSTIC BILATERAL MAMMOGRAM WITH TOMOSYNTHESIS AND CAD TECHNIQUE: Bilateral digital diagnostic mammography and breast tomosynthesis was performed. The images were evaluated with  computer-aided detection. COMPARISON:  Previous exam(s). ACR Breast Density Category b: There are scattered areas of fibroglandular density. FINDINGS: Left lumpectomy site is stable. No new or suspicious findings identified in either breast. IMPRESSION: No mammographic evidence of malignancy. RECOMMENDATION: Per protocol, as the patient is now 2 or more years status post lumpectomy, she may return to annual screening mammography in 1 year. However, given the history of breast cancer, the patient remains eligible for annual diagnostic mammography if preferred. I have discussed the findings and recommendations with the patient. If applicable, a reminder letter will be sent to the patient regarding the next appointment. BI-RADS CATEGORY  2: Benign. Electronically Signed   By: Dorise Bullion III M.D.   On: 04/08/2022 17:55    ELIGIBLE FOR AVAILABLE RESEARCH PROTOCOL: No  ASSESSMENT: 56 y.o. [place] woman status post left breast biopsy 12/14/2019 for ductal carcinoma in situ, clinically 1.8 cm, grade 3, estrogen and progesterone receptor negative  (1) genetics testing 01/01/2020 through the STAT Breast cancer panel offered by [facility] found no deleterious mutations in  ATM, BRCA1, BRCA2, CDH1, CHEK2, PALB2, PTEN, STK11 and TP53.  The Multi-Cancer Panel offered by [facility] includes also found no deleterious mutations inAIP, ALK, APC, ATM, AXIN2,BAP1,  BARD1, BLM, BMPR1A, BRCA1, BRCA2, BRIP1, CASR, CDC73, CDH1, CDK4, CDKN1B, CDKN1C, CDKN2A (p14ARF), CDKN2A (p16INK4a), CEBPA, CHEK2, CTNNA1, DICER1, DIS3L2, EGFR (c.2369C>T, p.Thr790Met variant only), EPCAM (Deletion/duplication testing only), FH, FLCN, GATA2, GPC3, GREM1 (Promoter region deletion/duplication testing only), HOXB13 (c.251G>A, p.Gly84Glu), HRAS, KIT, MAX, MEN1, MET, MITF (c.952G>A, p.Glu318Lys variant only), MLH1, MSH2, MSH3, MSH6, MUTYH, NBN, NF1, NF2, NTHL1, PALB2, PDGFRA, PHOX2B, PMS2, POLD1, POLE, POT1, PRKAR1A, PTCH1, PTEN, RAD50, RAD51C, RAD51D,  RB1, RECQL4, RET, RNF43, RUNX1, SDHAF2, SDHA (sequence changes only), SDHB, SDHC, SDHD, SMAD4, SMARCA4, SMARCB1, SMARCE1, STK11, SUFU, TERC, TERT, TMEM127, TP53, TSC1, TSC2, VHL, WRN and WT1.     (a) a variant of  uncertain significance was detected in the MSH3 gene called c.230_232dup (p.Pro77dup).   (2) status post bilateral lumpectomies 02/15/2020, showing  (a) on the right, no evidence of malignancy (radial scar, fibroadenoma).  (b) on the left, ductal carcinoma in situ, grade 3, measuring 1.4 cm, with negative margins  (3) adjuvant radiation 03/20/2020 - 05/04/2020  (a) left breast / 50.4 Gy in 28 fractions  (b) seroma boost / 10 Gy in 5 fractions  (4) started tamoxifen 05/29/2020   PLAN:  She is tolerating tamoxifen well and the plan is to continue that a total of 5 years. She continues to deal with chronic headaches, working with neurology, recently started Botox injections. Physical examination today without any concerns for palpable masses. She will proceed with mammogram next year and return to clinic in 1 year Headaches are very unlikely related to her DCIS.  She should continue to work with her neurologist for further recommendations  Total encounter time 30 minutes.* She is a new patient to me, transitioning from Dr. Jana Hakim upon his retirement  Benay Pike, MD   04/12/2022 9:29 AM Medical Oncology and Hematology [facility] [place], [place] [address] Tel. [phone]    Fax. [phone]  *Total Encounter Time as defined by the Centers for Medicare and Medicaid Services includes, in addition to the face-to-face time of a patient visit (documented in the note above) non-face-to-face time: obtaining and reviewing outside history, ordering and reviewing medications, tests or procedures, care coordination (communications with other health care professionals or caregivers) and documentation in the medical record.

## 2022-05-22 DIAGNOSIS — G43719 Chronic migraine without aura, intractable, without status migrainosus: Secondary | ICD-10-CM | POA: Diagnosis not present

## 2022-06-24 DIAGNOSIS — G43719 Chronic migraine without aura, intractable, without status migrainosus: Secondary | ICD-10-CM | POA: Diagnosis not present

## 2022-06-25 DIAGNOSIS — N898 Other specified noninflammatory disorders of vagina: Secondary | ICD-10-CM | POA: Diagnosis not present

## 2022-06-25 DIAGNOSIS — I10 Essential (primary) hypertension: Secondary | ICD-10-CM | POA: Diagnosis not present

## 2022-06-25 DIAGNOSIS — N390 Urinary tract infection, site not specified: Secondary | ICD-10-CM | POA: Diagnosis not present

## 2022-06-25 DIAGNOSIS — Z130 Encounter for screening for diseases of the blood and blood-forming organs and certain disorders involving the immune mechanism: Secondary | ICD-10-CM | POA: Diagnosis not present

## 2022-06-25 DIAGNOSIS — R300 Dysuria: Secondary | ICD-10-CM | POA: Diagnosis not present

## 2022-06-25 DIAGNOSIS — E782 Mixed hyperlipidemia: Secondary | ICD-10-CM | POA: Diagnosis not present

## 2022-06-25 DIAGNOSIS — R319 Hematuria, unspecified: Secondary | ICD-10-CM | POA: Diagnosis not present

## 2022-07-02 DIAGNOSIS — G43719 Chronic migraine without aura, intractable, without status migrainosus: Secondary | ICD-10-CM | POA: Diagnosis not present

## 2022-09-18 DIAGNOSIS — G43719 Chronic migraine without aura, intractable, without status migrainosus: Secondary | ICD-10-CM | POA: Diagnosis not present

## 2022-09-25 DIAGNOSIS — G43719 Chronic migraine without aura, intractable, without status migrainosus: Secondary | ICD-10-CM | POA: Diagnosis not present

## 2022-11-07 DIAGNOSIS — L814 Other melanin hyperpigmentation: Secondary | ICD-10-CM | POA: Diagnosis not present

## 2022-11-07 DIAGNOSIS — D1801 Hemangioma of skin and subcutaneous tissue: Secondary | ICD-10-CM | POA: Diagnosis not present

## 2022-11-07 DIAGNOSIS — L821 Other seborrheic keratosis: Secondary | ICD-10-CM | POA: Diagnosis not present

## 2022-11-07 DIAGNOSIS — Z08 Encounter for follow-up examination after completed treatment for malignant neoplasm: Secondary | ICD-10-CM | POA: Diagnosis not present

## 2022-12-30 DIAGNOSIS — G43719 Chronic migraine without aura, intractable, without status migrainosus: Secondary | ICD-10-CM | POA: Diagnosis not present

## 2023-01-16 DIAGNOSIS — H40023 Open angle with borderline findings, high risk, bilateral: Secondary | ICD-10-CM | POA: Diagnosis not present

## 2023-01-21 DIAGNOSIS — G43709 Chronic migraine without aura, not intractable, without status migrainosus: Secondary | ICD-10-CM | POA: Diagnosis not present

## 2023-02-19 DIAGNOSIS — G44209 Tension-type headache, unspecified, not intractable: Secondary | ICD-10-CM | POA: Diagnosis not present

## 2023-02-19 DIAGNOSIS — R29818 Other symptoms and signs involving the nervous system: Secondary | ICD-10-CM | POA: Diagnosis not present

## 2023-02-19 DIAGNOSIS — M5431 Sciatica, right side: Secondary | ICD-10-CM | POA: Diagnosis not present

## 2023-02-19 DIAGNOSIS — I10 Essential (primary) hypertension: Secondary | ICD-10-CM | POA: Diagnosis not present

## 2023-03-13 DIAGNOSIS — I10 Essential (primary) hypertension: Secondary | ICD-10-CM | POA: Diagnosis not present

## 2023-03-13 DIAGNOSIS — R051 Acute cough: Secondary | ICD-10-CM | POA: Diagnosis not present

## 2023-03-13 DIAGNOSIS — R7981 Abnormal blood-gas level: Secondary | ICD-10-CM | POA: Diagnosis not present

## 2023-03-13 DIAGNOSIS — R0681 Apnea, not elsewhere classified: Secondary | ICD-10-CM | POA: Diagnosis not present

## 2023-03-13 DIAGNOSIS — J069 Acute upper respiratory infection, unspecified: Secondary | ICD-10-CM | POA: Diagnosis not present

## 2023-03-18 DIAGNOSIS — I10 Essential (primary) hypertension: Secondary | ICD-10-CM | POA: Diagnosis not present

## 2023-03-18 DIAGNOSIS — R8290 Unspecified abnormal findings in urine: Secondary | ICD-10-CM | POA: Diagnosis not present

## 2023-03-18 DIAGNOSIS — N898 Other specified noninflammatory disorders of vagina: Secondary | ICD-10-CM | POA: Diagnosis not present

## 2023-03-18 DIAGNOSIS — L989 Disorder of the skin and subcutaneous tissue, unspecified: Secondary | ICD-10-CM | POA: Diagnosis not present

## 2023-03-18 DIAGNOSIS — J069 Acute upper respiratory infection, unspecified: Secondary | ICD-10-CM | POA: Diagnosis not present

## 2023-04-01 ENCOUNTER — Other Ambulatory Visit: Payer: Self-pay | Admitting: Nurse Practitioner

## 2023-04-01 DIAGNOSIS — Z853 Personal history of malignant neoplasm of breast: Secondary | ICD-10-CM

## 2023-04-03 ENCOUNTER — Ambulatory Visit (INDEPENDENT_AMBULATORY_CARE_PROVIDER_SITE_OTHER): Payer: BC Managed Care – PPO | Admitting: Nurse Practitioner

## 2023-04-03 ENCOUNTER — Institutional Professional Consult (permissible substitution): Payer: BC Managed Care – PPO | Admitting: Nurse Practitioner

## 2023-04-03 ENCOUNTER — Encounter: Payer: Self-pay | Admitting: Nurse Practitioner

## 2023-04-03 VITALS — BP 114/82 | HR 67 | Temp 98.1°F | Ht 64.5 in | Wt 172.0 lb

## 2023-04-03 DIAGNOSIS — F5101 Primary insomnia: Secondary | ICD-10-CM

## 2023-04-03 DIAGNOSIS — G43019 Migraine without aura, intractable, without status migrainosus: Secondary | ICD-10-CM

## 2023-04-03 DIAGNOSIS — G4719 Other hypersomnia: Secondary | ICD-10-CM

## 2023-04-03 NOTE — Progress Notes (Signed)
@Patient  ID: Sherri Schultz, female    DOB: 04-25-1966, 57 y.o.   MRN: [id number]  Chief Complaint  Patient presents with   Consult    Pt oxygen levels drop at night. Pt gets headaches often, daytime sleepiness     Referring provider: Elizabeth Palau, FNP  HPI: 57 year old female, never smoker referred for sleep consult.  Past medical history significant for migraine headaches, DCIS of left breast, malignant melanoma of right lower leg, hypertension, HLD, anxiety.  TEST/EVENTS:   04/03/2023: Today-sleep consult Patient presents today for sleep consult, referred by Elizabeth Palau, NP.  She has a longstanding history of migraines with failure to improve despite multiple treatments.  Concern was that she may have underlying sleep apnea as a contributing factor.  She does tell me that she is fatigued all the time and has no energy.  She wakes up in the morning feeling tired.  She also noticed that her watch was notifying her of low oxygen levels at night.  She thinks that 77 is the lowest she went.  She wakes up frequently throughout the night but she was not sure if this is related to her migraines or not.  She does occasionally take a muscle relaxer at bedtime for her headaches which does help with sleep onset.  Denies any known history of snoring, witnessed apneas, drowsy driving, sleep parasomnia/paralysis.  No history of narcolepsy or symptoms of cataplexy. She goes to bed around 11 PM.  Typically falls asleep within an hour.  Wakes 2 or more times a night.  Gets up around 6:30 AM.  She is retired.  Gained 15 pounds in the last 2 years.  Never had a previous sleep study. She has a history of high blood pressure.  No history of diabetes, stroke or heart rhythm problems.  She is a never smoker.  Does not drink any alcohol.  She drinks 3 sodas a day for caffeine.  Lives with her husband.  [place] 2   No Known Allergies   There is no immunization history on file for this patient.  Past  Medical History:  Diagnosis Date   Anxiety    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   Cancer [facility]) 2013   right knee   Family history of brain cancer    Family history of breast cancer    Family history of colon cancer    Family history of melanoma    Hypertension    Personal history of radiation therapy    Trimalleolar fracture of ankle, closed, left, initial encounter     Tobacco History: Social History   Tobacco Use  Smoking Status Never  Smokeless Tobacco Never   Counseling given: Not Answered   Outpatient Medications Prior to Visit  Medication Sig Dispense Refill   baclofen (LIORESAL) 10 MG tablet Take 10 mg by mouth 3 (three) times daily.     meclizine (ANTIVERT) 25 MG tablet Take 1 tablet by mouth 3 (three) times daily as needed.     prochlorperazine (COMPAZINE) 10 MG tablet Take 10 mg by mouth every 6 (six) hours as needed for nausea or vomiting.     propranolol (INDERAL) 40 MG tablet Take 40 mg by mouth daily.     rosuvastatin (CRESTOR) 10 MG tablet Take 10 mg by mouth daily.     SUMAtriptan (IMITREX) 25 MG tablet May repeat in 2 hours if headache persists or recurs. 10 tablet 0   tamoxifen (NOLVADEX) 20 MG tablet Take  1 tablet (20 mg total) by mouth daily. 90 tablet 3   topiramate (TOPAMAX) 100 MG tablet Take 100 mg by mouth.     valACYclovir (VALTREX) 1000 MG tablet Take 2 tablets by mouth 2 (two) times daily as needed.     venlafaxine XR (EFFEXOR-XR) 150 MG 24 hr capsule Take 1 capsule by mouth daily.  0   No facility-administered medications prior to visit.     Review of Systems:   Constitutional: No night sweats, fevers, chills, or lassitude. +weight gain, daytime fatigue HEENT: No difficulty swallowing, tooth/dental problems, or sore throat. No sneezing, itching, ear ache, nasal congestion, or post nasal drip. +migraines CV:  No chest pain, orthopnea, PND, swelling in lower extremities, anasarca, dizziness, palpitations, syncope Resp: No shortness  of breath with exertion or at rest. No excess mucus or change in color of mucus. No productive or non-productive. No hemoptysis. No wheezing.  No chest wall deformity GI:  +occasional heartburn/indigestion. No abdominal pain, nausea, vomiting, diarrhea, change in bowel habits, loss of appetite, bloody stools.  GU: No dysuria, change in color of urine, urgency or frequency.  No flank pain, no hematuria  Skin: No rash, lesions, ulcerations MSK:  No joint pain or swelling.  No decreased range of motion.  No back pain. Neuro: No dizziness or lightheadedness.  Psych: No depression or anxiety. Mood stable. +sleep disturbance    Physical Exam:  BP 114/82   Pulse 67   Temp 98.1 F (36.7 C) (Oral)   Ht 5' 4.5" (1.638 m)   Wt 172 lb (78 kg)   LMP 03/27/2011   SpO2 96%   BMI 29.07 kg/m   GEN: Pleasant, interactive, well-appearing; in no acute distress. HEENT:  Normocephalic and atraumatic. PERRLA. Sclera white. Nasal turbinates pink, moist and patent bilaterally. No rhinorrhea present. Oropharynx pink and moist, without exudate or edema. No lesions, ulcerations, or postnasal drip. Mallampati III NECK:  Supple w/ fair ROM. No JVD present. Normal carotid impulses w/o bruits. Thyroid symmetrical with no goiter or nodules palpated. No lymphadenopathy.   CV: RRR, no m/r/g, no peripheral edema. Pulses intact, +2 bilaterally. No cyanosis, pallor or clubbing. PULMONARY:  Unlabored, regular breathing. Clear bilaterally A&P w/o wheezes/rales/rhonchi. No accessory muscle use.  GI: BS present and normoactive. Soft, non-tender to palpation. No organomegaly or masses detected.  MSK: No erythema, warmth or tenderness. Cap refil <2 sec all extrem. No deformities or joint swelling noted.  Neuro: A/Ox3. No focal deficits noted.   Skin: Warm, no lesions or rashe Psych: Normal affect and behavior. Judgement and thought content appropriate.     Lab Results:  CBC    Component Value Date/Time   WBC 5.1  04/12/2022 0857   WBC 4.9 11/01/2021 0946   RBC 3.93 04/12/2022 0857   HGB 12.4 04/12/2022 0857   HCT 37.4 04/12/2022 0857   PLT 274 04/12/2022 0857   MCV 95.2 04/12/2022 0857   MCH 31.6 04/12/2022 0857   MCHC 33.2 04/12/2022 0857   RDW 13.2 04/12/2022 0857   LYMPHSABS 1.8 04/12/2022 0857   MONOABS 0.4 04/12/2022 0857   EOSABS 0.1 04/12/2022 0857   BASOSABS 0.0 04/12/2022 0857    BMET    Component Value Date/Time   NA 142 04/12/2022 0857   K 4.5 04/12/2022 0857   CL 114 (H) 04/12/2022 0857   CO2 23 04/12/2022 0857   GLUCOSE 101 (H) 04/12/2022 0857   BUN 12 04/12/2022 0857   CREATININE 0.98 04/12/2022 0857   CALCIUM 9.0 04/12/2022  0857   GFRNONAA >60 04/12/2022 0857   GFRAA >60 12/22/2019 0827    BNP No results found for: "BNP"   Imaging:  No results found.        No data to display          No results found for: "NITRICOXIDE"      Assessment & Plan:   Excessive daytime sleepiness She has excessive daytime sleepiness, restless sleep, chronic migraine headaches. Given this,  I am concerned she could have sleep disordered breathing with obstructive sleep apnea. She will need sleep study for further evaluation.    - discussed how weight can impact sleep and risk for sleep disordered breathing - discussed options to assist with weight loss: combination of diet modification, cardiovascular and strength training exercises   - had an extensive discussion regarding the adverse health consequences related to untreated sleep disordered breathing - specifically discussed the risks for hypertension, coronary artery disease, cardiac dysrhythmias, cerebrovascular disease, and diabetes - lifestyle modification discussed   - discussed how sleep disruption can increase risk of accidents, particularly when driving - safe driving practices were discussed  Patient Instructions  Given your symptoms, I am concerned that you may have sleep disordered breathing with sleep  apnea. You will need a sleep study for further evaluation. Someone will contact you to schedule this.   We discussed how untreated sleep apnea puts an individual at risk for cardiac arrhthymias, pulm HTN, DM, stroke and increases their risk for daytime accidents. We also briefly reviewed treatment options including weight loss, side sleeping position, oral appliance, CPAP therapy or referral to ENT for possible surgical options  Use caution when driving and pull over if you become sleepy.  Follow up in 6 weeks with Katie Ladonya Jerkins,NP to go over sleep study results, or sooner, if needed      Migraine headache without aura See above. Continue working with PCP/neurology.  Insomnia Primarily sleep maintenance but occasional issues with sleep latency. Possibly due to underlying untreated OSA. See above plan.   I spent 35 minutes of dedicated to the care of this patient on the date of this encounter to include pre-visit review of records, face-to-face time with the patient discussing conditions above, post visit ordering of testing, clinical documentation with the electronic health record, making appropriate referrals as documented, and communicating necessary findings to members of the patients care team.  Noemi Chapel, NP 04/04/2023  Pt aware and understands NP's role.

## 2023-04-03 NOTE — Patient Instructions (Signed)
Given your symptoms, I am concerned that you may have sleep disordered breathing with sleep apnea. You will need a sleep study for further evaluation. Someone will contact you to schedule this.   We discussed how untreated sleep apnea puts an individual at risk for cardiac arrhthymias, pulm HTN, DM, stroke and increases their risk for daytime accidents. We also briefly reviewed treatment options including weight loss, side sleeping position, oral appliance, CPAP therapy or referral to ENT for possible surgical options  Use caution when driving and pull over if you become sleepy.  Follow up in 6 weeks with Sherri Riana Tessmer,NP to go over sleep study results, or sooner, if needed   

## 2023-04-04 ENCOUNTER — Encounter: Payer: Self-pay | Admitting: Nurse Practitioner

## 2023-04-04 DIAGNOSIS — G43009 Migraine without aura, not intractable, without status migrainosus: Secondary | ICD-10-CM | POA: Insufficient documentation

## 2023-04-04 DIAGNOSIS — G4719 Other hypersomnia: Secondary | ICD-10-CM | POA: Insufficient documentation

## 2023-04-04 DIAGNOSIS — G4700 Insomnia, unspecified: Secondary | ICD-10-CM | POA: Insufficient documentation

## 2023-04-04 NOTE — Assessment & Plan Note (Signed)
She has excessive daytime sleepiness, restless sleep, chronic migraine headaches. Given this,  I am concerned she could have sleep disordered breathing with obstructive sleep apnea. She will need sleep study for further evaluation.    - discussed how weight can impact sleep and risk for sleep disordered breathing - discussed options to assist with weight loss: combination of diet modification, cardiovascular and strength training exercises   - had an extensive discussion regarding the adverse health consequences related to untreated sleep disordered breathing - specifically discussed the risks for hypertension, coronary artery disease, cardiac dysrhythmias, cerebrovascular disease, and diabetes - lifestyle modification discussed   - discussed how sleep disruption can increase risk of accidents, particularly when driving - safe driving practices were discussed  Patient Instructions  Given your symptoms, I am concerned that you may have sleep disordered breathing with sleep apnea. You will need a sleep study for further evaluation. Someone will contact you to schedule this.   We discussed how untreated sleep apnea puts an individual at risk for cardiac arrhthymias, pulm HTN, DM, stroke and increases their risk for daytime accidents. We also briefly reviewed treatment options including weight loss, side sleeping position, oral appliance, CPAP therapy or referral to ENT for possible surgical options  Use caution when driving and pull over if you become sleepy.  Follow up in 6 weeks with Katie Konstantin Lehnen,NP to go over sleep study results, or sooner, if needed

## 2023-04-04 NOTE — Assessment & Plan Note (Signed)
See above. Continue working with PCP/neurology.

## 2023-04-04 NOTE — Assessment & Plan Note (Signed)
Primarily sleep maintenance but occasional issues with sleep latency. Possibly due to underlying untreated OSA. See above plan.

## 2023-04-14 ENCOUNTER — Inpatient Hospital Stay: Payer: BC Managed Care – PPO | Attending: Hematology and Oncology | Admitting: Hematology and Oncology

## 2023-04-14 ENCOUNTER — Other Ambulatory Visit: Payer: Self-pay

## 2023-04-14 VITALS — BP 151/72 | HR 60 | Temp 97.3°F | Resp 16 | Wt 170.8 lb

## 2023-04-14 DIAGNOSIS — D0512 Intraductal carcinoma in situ of left breast: Secondary | ICD-10-CM

## 2023-04-14 DIAGNOSIS — G8929 Other chronic pain: Secondary | ICD-10-CM | POA: Diagnosis not present

## 2023-04-14 DIAGNOSIS — R519 Headache, unspecified: Secondary | ICD-10-CM | POA: Insufficient documentation

## 2023-04-14 DIAGNOSIS — Z800 Family history of malignant neoplasm of digestive organs: Secondary | ICD-10-CM | POA: Insufficient documentation

## 2023-04-14 DIAGNOSIS — Z803 Family history of malignant neoplasm of breast: Secondary | ICD-10-CM | POA: Insufficient documentation

## 2023-04-14 DIAGNOSIS — Z79899 Other long term (current) drug therapy: Secondary | ICD-10-CM | POA: Diagnosis not present

## 2023-04-14 DIAGNOSIS — Z923 Personal history of irradiation: Secondary | ICD-10-CM | POA: Insufficient documentation

## 2023-04-14 NOTE — Progress Notes (Signed)
[facility]  Telephone:(336) [phone] Fax:(336) [phone]     ID: Sherri Schultz DOB: 07-18-1966  MR#: [id number]  JYN#:[id number]  Patient Care Team: Elizabeth Palau, FNP as PCP - General (Nurse Practitioner) Donnelly Angelica, RN as Oncology Nurse Navigator Pershing Proud, RN as Oncology Nurse Navigator Almond Lint, MD as Consulting Physician (General Surgery) Magrinat, Valentino Hue, MD (Inactive) as Consulting Physician (Oncology) Dorothy Puffer, MD as Consulting Physician (Radiation Oncology) Tarry Kos, MD as Attending Physician (Orthopedic Surgery) Iran Ouch, FNP (Family Medicine) Rachel Moulds, MD OTHER MD:   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 She started tamoxifen on 05/29/2020.  She is tolerating this well, with rare hot flashes, no problems with vaginal wetness.   She continues to deal with chronic headaches, she says her current medication for migraine is not working. Last mammogram May 15, BI RADS 2. She has her mammogram scheduled for 04/16/2023. No vaginal bleeding, change in breathing or leg swelling.  Rest of the pertinent 10 point ROS reviewed and neg   COVID 19 VACCINATION STATUS: J[employer] x1, then [employer] x2 as of December 2022   HISTORY OF CURRENT ILLNESS: From the original intake note:  Sherri Schultz had routine screening mammography on 12/01/2019 showing a possible abnormalities in the left breast. She underwent left diagnostic mammography with tomography and left breast ultrasonography at [facility] on 12/08/2019 showing: breast density category C; suspicious 1.5 cm mass involving the lower-inner quadrant of the left breast at 8:30, with a hyperechoic halo surrounding the mass for a total span of 1.8 cm; indeterminate 0.4 cm mass in the upper-inner quadrant of the left breast  at 10 o'clock; no pathologic left axillary lymphadenopathy; benign cyst in the upper-outer subareolar left breast.  Accordingly on 12/14/2019 she proceeded to biopsy of the left breast areas in question. The pathology from this procedure ([id number]) showed:  1. Left breast, 8:30   - ductal carcinoma in situ, grade 3, with apocrine features (0.6 cm on biopsy)  - estrogen receptor, 0% negative and progesterone receptor, 0% negative.  2. Left breast, 10 o'clock  - fibroadenomatoid nodule with columnar cells changes  The patient's subsequent history is as detailed below.   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   Cancer (HCC) 2013   right knee   Family history of brain cancer    Family history of breast cancer    Family history of colon cancer    Family history of melanoma    Hypertension    Personal history of radiation therapy    Trimalleolar fracture of ankle, closed, left, initial encounter     PAST SURGICAL HISTORY: Past Surgical History:  Procedure Laterality Date   BREAST BIOPSY Right 10+ yrs ago   benign   BREAST BIOPSY Right 01/21/2020   x2   BREAST BIOPSY Left 12/14/2019   x2   BREAST CYST ASPIRATION Right 03/12/2021   BREAST EXCISIONAL BIOPSY Right 02/15/2020   BREAST LUMPECTOMY Left 02/15/2020   BREAST LUMPECTOMY WITH RADIOACTIVE SEED LOCALIZATION Left 02/15/2020   Procedure: LEFT BREAST LUMPECTOMY WITH RADIOACTIVE SEED LOCALIZATION;  Surgeon: Almond Lint, MD;  Location: [facility];  Service: General;  Laterality: Left;   MELANOMA EXCISION Right 2013   knee  ORIF ANKLE FRACTURE Left 05/06/2018   Procedure: OPEN REDUCTION INTERNAL FIXATION (ORIF) LEFT TRIMALLEOLAR ANKLE FRACTURE;  Surgeon: Tarry Kos, MD;  Location: [facility];  Service: Orthopedics;  Laterality: Left;   RADIOACTIVE SEED GUIDED EXCISIONAL BREAST BIOPSY Right 02/15/2020   Procedure:  RIGHT BREAST RADIOACTIVE SEED LOCALIZATION EXCISIONAL BIOPSY X 2;  Surgeon: Almond Lint, MD;  Location: [facility];  Service: General;  Laterality: Right;    FAMILY HISTORY: Family History  Problem Relation Age of Onset   Breast cancer Mother 59   Diabetes Mother    Colon cancer Mother 76   Breast cancer Maternal Aunt        dx. >50   Diabetes Father    Hypertension Father    Heart attack Father    Arthritis Father    Diabetes Brother    Melanoma Brother 65   Diabetes Brother    Breast cancer Cousin        dx. 40s/50s   Brain cancer Maternal Uncle        dx. 20s   Breast cancer Maternal Aunt        dx. >50   Breast cancer Maternal Aunt        dx. >50   Cancer Maternal Uncle        unknown type, dx. >50   Breast cancer Cousin        dx. 40s/50s  The patient's father died at age 20 from congestive heart failure.  Patient's mother died from breast cancer at age 66, diagnosed age 44.. The patient denies a family hx of ovarian cancer. She reports breast cancer in 2 maternal aunts and 2 maternal first cousins.  The patient has 2 brothers with no history of cancer.   GYNECOLOGIC HISTORY:  Patient's last menstrual period was 03/27/2011. Menarche: 56 years old GX P 0 LMP 2017 HRT no  Hysterectomy? no BSO? 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  They mostly have trees in their [place] acres.  Her husband Sherri Schultz (the patient kept her name on her insurance) used to work as a Copywriter, advertising for [employer] but is now also retired.  They have no children and no pets.  The patient attends a local [employer]   ADVANCED DIRECTIVES: In the absence of any documents to the contrary the patient's husband is her healthcare power of attorney   HEALTH MAINTENANCE: Social History   Tobacco Use   Smoking status: Never   Smokeless tobacco: Never  Substance Use Topics   Alcohol use: Never    Drug use: Never     Colonoscopy: 2021/[facility]  PAP: 04/2016, negative  Bone density: Never   No Known Allergies  Current Outpatient Medications  Medication Sig Dispense Refill   baclofen (LIORESAL) 10 MG tablet Take 10 mg by mouth 3 (three) times daily.     meclizine (ANTIVERT) 25 MG tablet Take 1 tablet by mouth 3 (three) times daily as needed.     prochlorperazine (COMPAZINE) 10 MG tablet Take 10 mg by mouth every 6 (six) hours as needed for nausea or vomiting.     propranolol (INDERAL) 40 MG tablet Take 40 mg by mouth daily.     rosuvastatin (CRESTOR) 10 MG tablet Take 10 mg by mouth daily.     SUMAtriptan (IMITREX) 25 MG tablet May repeat in 2 hours if headache persists or recurs. 10  tablet 0   tamoxifen (NOLVADEX) 20 MG tablet Take 1 tablet (20 mg total) by mouth daily. 90 tablet 3   topiramate (TOPAMAX) 100 MG tablet Take 100 mg by mouth.     valACYclovir (VALTREX) 1000 MG tablet Take 2 tablets by mouth 2 (two) times daily as needed.     venlafaxine XR (EFFEXOR-XR) 150 MG 24 hr capsule Take 1 capsule by mouth daily.  0   No current facility-administered medications for this visit.    OBJECTIVE: White woman who appears stated age  Vitals:   04/14/23 0935  BP: (!) 151/72  Pulse: 60  Resp: 16  Temp: (!) 97.3 F (36.3 C)  SpO2: 100%      Body mass index is 28.86 kg/m.   Wt Readings from Last 3 Encounters:  04/14/23 170 lb 12.8 oz (77.5 kg)  04/03/23 172 lb (78 kg)  04/12/22 161 lb 9.6 oz (73.3 kg)     ECOG FS: 1  Physical Exam Chest:       Comments: Bilateral breasts inspected. No palpable masses or regional adenopathy Musculoskeletal:     Cervical back: Normal range of motion and neck supple. No rigidity.  Lymphadenopathy:     Cervical: No cervical adenopathy.     LAB RESULTS:  CMP     Component Value Date/Time   NA 142 04/12/2022 0857   K 4.5 04/12/2022 0857   CL 114 (H) 04/12/2022 0857   CO2 23 04/12/2022 0857   GLUCOSE 101 (H) 04/12/2022 0857    BUN 12 04/12/2022 0857   CREATININE 0.98 04/12/2022 0857   CALCIUM 9.0 04/12/2022 0857   PROT 7.3 04/12/2022 0857   ALBUMIN 4.2 04/12/2022 0857   AST 15 04/12/2022 0857   ALT 13 04/12/2022 0857   ALKPHOS 83 04/12/2022 0857   BILITOT 0.4 04/12/2022 0857   GFRNONAA >60 04/12/2022 0857   GFRAA >60 12/22/2019 0827    No results found for: "TOTALPROTELP", "ALBUMINELP", "A1GS", "A2GS", "BETS", "BETA2SER", "GAMS", "MSPIKE", "SPEI"  Lab Results  Component Value Date   WBC 5.1 04/12/2022   NEUTROABS 2.8 04/12/2022   HGB 12.4 04/12/2022   HCT 37.4 04/12/2022   MCV 95.2 04/12/2022   PLT 274 04/12/2022    No results found for: "LABCA2"  No components found for: "WUJWJX914"  No results for input(s): "INR" in the last 168 hours.  No results found for: "LABCA2"  No results found for: "NWG956"  No results found for: "CAN125"  No results found for: "CAN153"  No results found for: "CA2729"  No components found for: "HGQUANT"  No results found for: "CEA1", "CEA" / No results found for: "CEA1", "CEA"   No results found for: "AFPTUMOR"  No results found for: "CHROMOGRNA"  No results found for: "KPAFRELGTCHN", "LAMBDASER", "KAPLAMBRATIO" (kappa/lambda light chains)  No results found for: "HGBA", "HGBA2QUANT", "HGBFQUANT", "HGBSQUAN" (Hemoglobinopathy evaluation)   No results found for: "LDH"  No results found for: "IRON", "TIBC", "IRONPCTSAT" (Iron and TIBC)  No results found for: "FERRITIN"  Urinalysis No results found for: "COLORURINE", "APPEARANCEUR", "LABSPEC", "PHURINE", "GLUCOSEU", "HGBUR", "BILIRUBINUR", "KETONESUR", "PROTEINUR", "UROBILINOGEN", "NITRITE", "LEUKOCYTESUR"   STUDIES: No results found.   ELIGIBLE FOR AVAILABLE RESEARCH PROTOCOL: No  ASSESSMENT: 57 y.o. Sherri Schultz woman status post left breast biopsy 12/14/2019 for ductal carcinoma in situ, clinically 1.8 cm, grade 3, estrogen and progesterone receptor negative  (1) genetics testing 01/01/2020  through the STAT Breast cancer panel offered by [facility] found no deleterious mutations in  ATM, BRCA1, BRCA2, CDH1, CHEK2, PALB2, PTEN,  STK11 and TP53.  The Multi-Cancer Panel offered by [facility] includes also found no deleterious mutations inAIP, ALK, APC, ATM, AXIN2,BAP1,  BARD1, BLM, BMPR1A, BRCA1, BRCA2, BRIP1, CASR, CDC73, CDH1, CDK4, CDKN1B, CDKN1C, CDKN2A (p14ARF), CDKN2A (p16INK4a), CEBPA, CHEK2, CTNNA1, DICER1, DIS3L2, EGFR (c.2369C>T, p.Thr790Met variant only), EPCAM (Deletion/duplication testing only), FH, FLCN, GATA2, GPC3, GREM1 (Promoter region deletion/duplication testing only), HOXB13 (c.251G>A, p.Gly84Glu), HRAS, KIT, MAX, MEN1, MET, MITF (c.952G>A, p.Glu318Lys variant only), MLH1, MSH2, MSH3, MSH6, MUTYH, NBN, NF1, NF2, NTHL1, PALB2, PDGFRA, PHOX2B, PMS2, POLD1, POLE, POT1, PRKAR1A, PTCH1, PTEN, RAD50, RAD51C, RAD51D, RB1, RECQL4, RET, RNF43, RUNX1, SDHAF2, SDHA (sequence changes only), SDHB, SDHC, SDHD, SMAD4, SMARCA4, SMARCB1, SMARCE1, STK11, SUFU, TERC, TERT, TMEM127, TP53, TSC1, TSC2, VHL, WRN and WT1.     (a) a variant of  uncertain significance was detected in the MSH3 gene called c.230_232dup (p.Pro77dup).   (2) status post bilateral lumpectomies 02/15/2020, showing  (a) on the right, no evidence of malignancy (radial scar, fibroadenoma).  (b) on the left, ductal carcinoma in situ, grade 3, measuring 1.4 cm, with negative margins  (3) adjuvant radiation 03/20/2020 - 05/04/2020  (a) left breast / 50.4 Gy in 28 fractions  (b) seroma boost / 10 Gy in 5 fractions  (4) started tamoxifen 05/29/2020   PLAN:  She is tolerating tamoxifen well and the plan is to continue that a total of 5 years. She continues to deal with chronic headaches, working with neurology, she says she is dis satisfied with her current neurologist.  She will call us if she needs a new referral to [facility].  I have also encouraged her to discuss with her primary care physician. Physical examination today without  any concerns for palpable masses. She will proceed with mammogram as scheduled.  I have once again encouraged self breast exam and report any changes to [place].  She will otherwise return to clinic in 1 year.  Total encounter time 20 minutes.Rachel Moulds, MD   04/14/2023 9:42 AM Medical Oncology and Hematology [facility] [address] [place], [place] [address] Tel. [phone]    Fax. [phone]  *Total Encounter Time as defined by the Centers for Medicare and Medicaid Services includes, in addition to the face-to-face time of a patient visit (documented in the note above) non-face-to-face time: obtaining and reviewing outside history, ordering and reviewing medications, tests or procedures, care coordination (communications with other health care professionals or caregivers) and documentation in the medical record.

## 2023-04-16 ENCOUNTER — Ambulatory Visit
Admission: RE | Admit: 2023-04-16 | Discharge: 2023-04-16 | Disposition: A | Discharge status: Home / Self Care | Payer: BC Managed Care – PPO | Source: Ambulatory Visit | Attending: Nurse Practitioner | Admitting: Nurse Practitioner

## 2023-04-16 ENCOUNTER — Other Ambulatory Visit: Payer: Self-pay | Admitting: Nurse Practitioner

## 2023-04-16 DIAGNOSIS — N6489 Other specified disorders of breast: Secondary | ICD-10-CM | POA: Diagnosis not present

## 2023-04-16 DIAGNOSIS — Z853 Personal history of malignant neoplasm of breast: Secondary | ICD-10-CM

## 2023-04-16 DIAGNOSIS — R921 Mammographic calcification found on diagnostic imaging of breast: Secondary | ICD-10-CM | POA: Diagnosis not present

## 2023-04-30 DIAGNOSIS — G43719 Chronic migraine without aura, intractable, without status migrainosus: Secondary | ICD-10-CM | POA: Diagnosis not present

## 2023-05-11 ENCOUNTER — Ambulatory Visit (INDEPENDENT_AMBULATORY_CARE_PROVIDER_SITE_OTHER): Payer: BC Managed Care – PPO

## 2023-05-11 ENCOUNTER — Telehealth: Payer: Self-pay | Admitting: Pulmonary Disease

## 2023-05-11 DIAGNOSIS — G4719 Other hypersomnia: Secondary | ICD-10-CM

## 2023-05-11 DIAGNOSIS — G4733 Obstructive sleep apnea (adult) (pediatric): Secondary | ICD-10-CM

## 2023-05-11 NOTE — Telephone Encounter (Signed)
HST showed very mild  OSA with AHI 7/ hr with low sat 85% No treatment or dental appliance may be adequate She had 45 mins less than 88% without resp events - evaluate for underlying cardiopulmonary disease as needed

## 2023-05-14 ENCOUNTER — Telehealth: Payer: Self-pay | Admitting: *Deleted

## 2023-05-14 ENCOUNTER — Encounter: Payer: Self-pay | Admitting: *Deleted

## 2023-05-14 NOTE — Telephone Encounter (Signed)
Left vm and sent mychart message regarding the need to change appointment with Katie to a video visit on 6/20.  Requested that patient let us know if she will be able to do this.

## 2023-05-15 ENCOUNTER — Encounter: Payer: Self-pay | Admitting: Neurology

## 2023-05-15 ENCOUNTER — Telehealth (INDEPENDENT_AMBULATORY_CARE_PROVIDER_SITE_OTHER): Payer: BC Managed Care – PPO | Admitting: Nurse Practitioner

## 2023-05-15 ENCOUNTER — Encounter: Payer: Self-pay | Admitting: Nurse Practitioner

## 2023-05-15 VITALS — Ht 65.0 in | Wt 165.0 lb

## 2023-05-15 DIAGNOSIS — G4719 Other hypersomnia: Secondary | ICD-10-CM

## 2023-05-15 DIAGNOSIS — G43019 Migraine without aura, intractable, without status migrainosus: Secondary | ICD-10-CM | POA: Diagnosis not present

## 2023-05-15 DIAGNOSIS — G4733 Obstructive sleep apnea (adult) (pediatric): Secondary | ICD-10-CM | POA: Diagnosis not present

## 2023-05-15 NOTE — Assessment & Plan Note (Signed)
She has requested a referral to an alternative neurologist. Placed today.

## 2023-05-15 NOTE — Telephone Encounter (Signed)
Video visit was already completed.

## 2023-05-15 NOTE — Progress Notes (Signed)
Patient ID: Sherri Schultz, female     DOB: Mar 30, 1966, 57 y.o.      MRN: [id number]  Chief Complaint  Patient presents with   Follow-up    Hst results,     Virtual Visit via Video Note  I connected with Sherri Schultz on 05/15/23 at  9:30 AM EDT by a video enabled telemedicine application and verified that I am speaking with the correct person using two identifiers.  Location: Patient: Home Provider: Office   I discussed the limitations of evaluation and management by telemedicine and the availability of in person appointments. The patient expressed understanding and agreed to proceed.  History of Present Illness: 57 year old female, never smoker referred for sleep consult.  Past medical history significant for migraine headaches, DCIS of left breast, malignant melanoma of right lower leg, hypertension, HLD, anxiety.   TEST/EVENTS:   04/14/2023 HST: AHI 7.3/h, SpO2 low 85%, spent 45 min <88%  04/03/2023: OV with Sherri Raper NP for sleep consult, referred by Sherri Palau, NP.  She has a longstanding history of migraines with failure to improve despite multiple treatments.  Concern was that she may have underlying sleep apnea as a contributing factor.  She does tell me that she is fatigued all the time and has no energy.  She wakes up in the morning feeling tired.  She also noticed that her watch was notifying her of low oxygen levels at night.  She thinks that 65 is the lowest she went.  She wakes up frequently throughout the night but she was not sure if this is related to her migraines or not.  She does occasionally take a muscle relaxer at bedtime for her headaches which does help with sleep onset.  Denies any known history of snoring, witnessed apneas, drowsy driving, sleep parasomnia/paralysis.  No history of narcolepsy or symptoms of cataplexy. She goes to bed around 11 PM.  Typically falls asleep within an hour.  Wakes 2 or more times a night.  Gets up around 6:30 AM.  She is retired.  Gained 15  pounds in the last 2 years.  Never had a previous sleep study. She has a history of high blood pressure.  No history of diabetes, stroke or heart rhythm problems.  She is a never smoker.  Does not drink any alcohol.  She drinks 3 sodas a day for caffeine.  Lives with her husband.  05/15/2023: Today - follow up Patient presents today for follow up to discuss home sleep study results, which revealed mild sleep apnea with AHI 7.3/h. She had SpO2 low ot 85% and spent 45 min <88% during the test. She does not have any history of cardiac or pulmonary disease. She continues to have trouble with migraines. She is always tired with no energy. She denies any drowsy driving or sleep parasomnias/paralysis.   No Known Allergies  There is no immunization history on file for this patient. Past Medical History:  Diagnosis Date   Anxiety    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   Cancer [facility]) 2013   right knee   Family history of brain cancer    Family history of breast cancer    Family history of colon cancer    Family history of melanoma    Hypertension    Personal history of radiation therapy    Trimalleolar fracture of ankle, closed, left, initial encounter     Tobacco History: Social History   Tobacco Use  Smoking Status Never  Smokeless Tobacco Never   Counseling given: Not Answered   Outpatient Medications Prior to Visit  Medication Sig Dispense Refill   baclofen (LIORESAL) 10 MG tablet Take 10 mg by mouth 3 (three) times daily.     meclizine (ANTIVERT) 25 MG tablet Take 1 tablet by mouth 3 (three) times daily as needed.     prochlorperazine (COMPAZINE) 10 MG tablet Take 10 mg by mouth every 6 (six) hours as needed for nausea or vomiting.     propranolol (INDERAL) 40 MG tablet Take 40 mg by mouth daily.     rosuvastatin (CRESTOR) 10 MG tablet Take 10 mg by mouth daily.     SUMAtriptan (IMITREX) 25 MG tablet May repeat in 2 hours if headache persists or recurs. 10 tablet 0    tamoxifen (NOLVADEX) 20 MG tablet Take 1 tablet (20 mg total) by mouth daily. 90 tablet 3   valACYclovir (VALTREX) 1000 MG tablet Take 2 tablets by mouth 2 (two) times daily as needed.     venlafaxine XR (EFFEXOR-XR) 150 MG 24 hr capsule Take 1 capsule by mouth daily.  0   No facility-administered medications prior to visit.     Review of Systems:   Constitutional: No night sweats, fevers, chills, or lassitude. +weight gain, daytime fatigue HEENT: No difficulty swallowing, tooth/dental problems, or sore throat. No sneezing, itching, ear ache, nasal congestion, or post nasal drip. +migraines CV:  No chest pain, orthopnea, PND, swelling in lower extremities, anasarca, dizziness, palpitations, syncope Resp: No shortness of breath with exertion or at rest. No excess mucus or change in color of mucus. No productive or non-productive. No hemoptysis. No wheezing.  No chest wall deformity GI:  +occasional heartburn/indigestion. No abdominal pain, nausea, vomiting, diarrhea, change in bowel habits, loss of appetite, bloody stools.  MSK:  No joint pain or swelling.   Neuro: No dizziness or lightheadedness.  Psych: No depression or anxiety. Mood stable. +sleep disturbanc   Observations/Objective: Patient is well-developed, well-nourished in no acute distress. A&Ox3. Resting comfortably at home. Unlabored, regular breathing. Speech is clear and coherent with logical content.    Assessment and Plan: Mild obstructive sleep apnea Mild OSA with significant daytime symptoms. She had a SpO2 low of 85% but spent 45 min <88% during her testing; no significant cardiac or pulmonary history. She also struggles with migraine headaches, which her previous neurologist wanted to rule out OSA for. We discussed minimal risks of untreated mild OSA and potential treatment options. She would like to trial CPAP given her symptoms. Orders placed for auto CPAP 5-15 cmH2O, mask of choice and heated humidity. Educated on proper  use/care of device. Risks/benefits reviewed. Aware of safe driving practices.  Patient Instructions  Start CPAP 5-15 cmH2O, mask of choice every night, minimum of 4-6 hours a night.  Change equipment every 30 days or as directed by DME. Wash your tubing with warm soap and water daily, hang to dry. Wash humidifier portion weekly. Use bottled distilled water, change daily.  Be aware of reduced alertness and do not drive or operate heavy machinery if experiencing this or drowsiness.  Exercise encouraged, as tolerated. Healthy weight management discussed.  Avoid or decrease alcohol consumption and medications that make you more sleepy, if possible. Notify if persistent daytime sleepiness occurs even with consistent use of CPAP.  We discussed how untreated sleep apnea puts an individual at risk for cardiac arrhthymias, pulm HTN, DM, stroke and increases their risk for daytime accidents. We also briefly reviewed treatment options including  weight loss, side sleeping position, oral appliance, CPAP therapy   Referral placed to neurology   Follow up in 12 weeks to see how CPAP is going with Dr. Wynona Neat. If symptoms do not improve or worsen, please contact office for sooner follow up    Migraine headache without aura She has requested a referral to an alternative neurologist. Placed today.     I discussed the assessment and treatment plan with the patient. The patient was provided an opportunity to ask questions and all were answered. The patient agreed with the plan and demonstrated an understanding of the instructions.   The patient was advised to call back or seek an in-person evaluation if the symptoms worsen or if the condition fails to improve as anticipated.  I provided 32 minutes of non-face-to-face time during this encounter.   Noemi Chapel, NP

## 2023-05-15 NOTE — Patient Instructions (Signed)
Start CPAP 5-15 cmH2O, mask of choice every night, minimum of 4-6 hours a night.  Change equipment every 30 days or as directed by DME. Wash your tubing with warm soap and water daily, hang to dry. Wash humidifier portion weekly. Use bottled distilled water, change daily.  Be aware of reduced alertness and do not drive or operate heavy machinery if experiencing this or drowsiness.  Exercise encouraged, as tolerated. Healthy weight management discussed.  Avoid or decrease alcohol consumption and medications that make you more sleepy, if possible. Notify if persistent daytime sleepiness occurs even with consistent use of CPAP.  We discussed how untreated sleep apnea puts an individual at risk for cardiac arrhthymias, pulm HTN, DM, stroke and increases their risk for daytime accidents. We also briefly reviewed treatment options including weight loss, side sleeping position, oral appliance, CPAP therapy   Referral placed to neurology   Follow up in 12 weeks to see how CPAP is going with Dr. Wynona Neat. If symptoms do not improve or worsen, please contact office for sooner follow up

## 2023-05-15 NOTE — Assessment & Plan Note (Signed)
Mild OSA with significant daytime symptoms. She had a SpO2 low of 85% but spent 45 min <88% during her testing; no significant cardiac or pulmonary history. She also struggles with migraine headaches, which her previous neurologist wanted to rule out OSA for. We discussed minimal risks of untreated mild OSA and potential treatment options. She would like to trial CPAP given her symptoms. Orders placed for auto CPAP 5-15 cmH2O, mask of choice and heated humidity. Educated on proper use/care of device. Risks/benefits reviewed. Aware of safe driving practices.  Patient Instructions  Start CPAP 5-15 cmH2O, mask of choice every night, minimum of 4-6 hours a night.  Change equipment every 30 days or as directed by DME. Wash your tubing with warm soap and water daily, hang to dry. Wash humidifier portion weekly. Use bottled distilled water, change daily.  Be aware of reduced alertness and do not drive or operate heavy machinery if experiencing this or drowsiness.  Exercise encouraged, as tolerated. Healthy weight management discussed.  Avoid or decrease alcohol consumption and medications that make you more sleepy, if possible. Notify if persistent daytime sleepiness occurs even with consistent use of CPAP.  We discussed how untreated sleep apnea puts an individual at risk for cardiac arrhthymias, pulm HTN, DM, stroke and increases their risk for daytime accidents. We also briefly reviewed treatment options including weight loss, side sleeping position, oral appliance, CPAP therapy   Referral placed to neurology   Follow up in 12 weeks to see how CPAP is going with Dr. Wynona Neat. If symptoms do not improve or worsen, please contact office for sooner follow up

## 2023-05-21 DIAGNOSIS — Z1321 Encounter for screening for nutritional disorder: Secondary | ICD-10-CM | POA: Diagnosis not present

## 2023-05-21 DIAGNOSIS — R739 Hyperglycemia, unspecified: Secondary | ICD-10-CM | POA: Diagnosis not present

## 2023-05-21 DIAGNOSIS — G43709 Chronic migraine without aura, not intractable, without status migrainosus: Secondary | ICD-10-CM | POA: Diagnosis not present

## 2023-05-21 DIAGNOSIS — I10 Essential (primary) hypertension: Secondary | ICD-10-CM | POA: Diagnosis not present

## 2023-07-09 ENCOUNTER — Ambulatory Visit (INDEPENDENT_AMBULATORY_CARE_PROVIDER_SITE_OTHER): Payer: BC Managed Care – PPO

## 2023-07-09 ENCOUNTER — Ambulatory Visit: Payer: BC Managed Care – PPO | Admitting: Emergency Medicine

## 2023-07-09 ENCOUNTER — Telehealth: Payer: Self-pay | Admitting: Emergency Medicine

## 2023-07-09 ENCOUNTER — Encounter: Payer: Self-pay | Admitting: Emergency Medicine

## 2023-07-09 VITALS — BP 122/72 | HR 73 | Resp 16 | Ht 65.0 in | Wt 169.8 lb

## 2023-07-09 DIAGNOSIS — G4733 Obstructive sleep apnea (adult) (pediatric): Secondary | ICD-10-CM | POA: Diagnosis not present

## 2023-07-09 DIAGNOSIS — R0602 Shortness of breath: Secondary | ICD-10-CM

## 2023-07-09 DIAGNOSIS — R0789 Other chest pain: Secondary | ICD-10-CM

## 2023-07-09 NOTE — Progress Notes (Signed)
Subjective:    Patient ID: Sherri Schultz, female    DOB: [birth year], 57 y.o.   MRN: [id number]  HPI Acute office visit 07/09/2023: 57 year old never smoker who has a history of left breast DCIS, migraine headaches, melanoma (right leg), hypertension, anxiety.  She has been seen in our office for OSA, last 05/15/2023 w K Cobb video visit. She reports today that she has been having shoulder and neck pain for about 3 weeks, sometimes associated with some chest discomfort and dyspnea. She was started on prednisone, seems to have helped the shoulders some. This am she had some mid chest pain, to her throat. Some high pitched noise. Occasional overt reflux. She is winded with a flight of stairs.    Review of Systems As per HPI  Past Medical History:  Diagnosis Date   Anxiety    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   Cancer (HCC) 2013   right knee   Family history of brain cancer    Family history of breast cancer    Family history of colon cancer    Family history of melanoma    Hypertension    Personal history of radiation therapy    Trimalleolar fracture of ankle, closed, left, initial encounter      Family History  Problem Relation Age of Onset   Breast cancer Mother 71   Diabetes Mother    Colon cancer Mother 41   Breast cancer Maternal Aunt        dx. >50   Diabetes Father    Hypertension Father    Heart attack Father    Arthritis Father    Diabetes Brother    Melanoma Brother 45   Diabetes Brother    Breast cancer Cousin        dx. 40s/50s   Brain cancer Maternal Uncle        dx. 20s   Breast cancer Maternal Aunt        dx. >50   Breast cancer Maternal Aunt        dx. >50   Cancer Maternal Uncle        unknown type, dx. >50   Breast cancer Cousin        dx. 40s/50s     Social History   Socioeconomic History   Marital status: Single    Spouse name: Not on file   Number of children: Not on file   Years of education: Not on file   Highest education  level: Not on file  Occupational History   Not on file  Tobacco Use   Smoking status: Never   Smokeless tobacco: Never  Substance and Sexual Activity   Alcohol use: Never   Drug use: Never   Sexual activity: Not on file  Other Topics Concern   Not on file  Social History Narrative   Not on file   Social Determinants of Health   Financial Resource Strain: Low Risk  (02/19/2023)   Received from [facility], [facility]   Overall Financial Resource Strain (CARDIA)    Difficulty of Paying Living Expenses: Not hard at all  Food Insecurity: No Food Insecurity (02/19/2023)   Received from [facility], [facility]   Hunger Vital Sign    Worried About Running Out of Food in the Last Year: Never true    Ran Out of Food in the Last Year: Never true  Transportation Needs: No Transportation Needs (02/19/2023)   [facility]   PRAPARE - Administrator, Civil Service (Medical): No    Lack of Transportation (Non-Medical): No  Physical Activity: Not on file  Stress: Not on file  Social Connections: Unknown (04/08/2022)   Received from [facility], [facility]   Social Network    Social Network: Not on file  Intimate Partner Violence: Unknown (02/27/2022)   Received from [facility], [facility]   HITS    Physically Hurt: Not on file    Insult or Talk Down To: Not on file    Threaten Physical Harm: Not on file    Scream or Curse: Not on file     No Known Allergies   Outpatient Medications Prior to Visit  Medication Sig Dispense Refill   baclofen (LIORESAL) 10 MG tablet Take 10 mg by mouth 3 (three) times daily.     botulinum toxin Type A (BOTOX) 200 units injection Inject 200 Units into the muscle every 3 (three) months.     levocetirizine (XYZAL) 5 MG tablet Take 5 mg by mouth every evening.     meclizine (ANTIVERT) 25 MG tablet Take 1 tablet by mouth 3 (three) times daily as needed.     omeprazole (PRILOSEC) 40 MG capsule Take  40 mg by mouth daily.     predniSONE (DELTASONE) 10 MG tablet Take 10 mg by mouth daily with breakfast.     prochlorperazine (COMPAZINE) 10 MG tablet Take 10 mg by mouth every 6 (six) hours as needed for nausea or vomiting.     propranolol (INDERAL) 40 MG tablet Take 40 mg by mouth daily.     Rimegepant Sulfate (NURTEC) 75 MG TBDP Take by mouth.     rosuvastatin (CRESTOR) 10 MG tablet Take 10 mg by mouth daily.     SUMAtriptan (IMITREX) 25 MG tablet May repeat in 2 hours if headache persists or recurs. 10 tablet 0   tamoxifen (NOLVADEX) 20 MG tablet Take 1 tablet (20 mg total) by mouth daily. 90 tablet 3   tiZANidine (ZANAFLEX) 4 MG capsule Take 4 mg by mouth 3 (three) times daily. 1/2 tab at night     valACYclovir (VALTREX) 1000 MG tablet Take 2 tablets by mouth 2 (two) times daily as needed.     venlafaxine XR (EFFEXOR-XR) 150 MG 24 hr capsule Take 1 capsule by mouth daily.  0   No facility-administered medications prior to visit.        Objective:   Physical Exam Vitals:   07/09/23 1113  BP: 122/72  Pulse: 73  Resp: 16  SpO2: 97%  Weight: 169 lb 12.8 oz (77 kg)  Height: 5\' 5"  (1.651 m)   Gen: Pleasant, well-nourished, in no distress,  normal affect  ENT: No lesions,  mouth clear,  oropharynx clear, no postnasal drip  Neck: No JVD, no stridor  Lungs: No use of accessory muscles, no crackles or wheezing on normal respiration, no wheeze on forced expiration  Cardiovascular: RRR, heart sounds normal, no murmur or gallops, no peripheral edema  Musculoskeletal: No deformities, no cyanosis or clubbing  Neuro: alert, awake, non focal  Skin: Warm, no lesions or rash      Assessment & Plan:  Mild obstructive sleep apnea CPAP has been ordered, not yet started  Chest discomfort With associated exertional dyspnea, some of which sounds sub-acute. There is an acute aspect, with mid chest discomfort, UA noise. ? Whether this coupled w mid chest pain is related to GERD. Will work  to better  define her dyspnea w CXR, PFT. No wheeze or stridor at this time. Will empirically increase PPI temporarily to see if there is a benefit.  We will arrange for pulmonary function testing We will perform a chest x-ray today Temporarily increase your Prilosec to 40 mg twice a day for 1 week.  Try to take this medication 1 hour around food.  After 1 week go back to taking once a day Agree with cardiology evaluation as planned Follow with Dr. Delton Coombes next available after your pulmonary function testing so we can review those results together.    Levy Pupa, MD, PhD 07/09/2023, 12:29 PM [facility] [phone] or if no answer before 7:00PM call [phone] For any issues after 7:00PM please call eLink [phone]

## 2023-07-09 NOTE — Assessment & Plan Note (Signed)
With associated exertional dyspnea, some of which sounds sub-acute. There is an acute aspect, with mid chest discomfort, UA noise. ? Whether this coupled w mid chest pain is related to GERD. Will work to better define her dyspnea w CXR, PFT. No wheeze or stridor at this time. Will empirically increase PPI temporarily to see if there is a benefit.  We will arrange for pulmonary function testing We will perform a chest x-ray today Temporarily increase your Prilosec to 40 mg twice a day for 1 week.  Try to take this medication 1 hour around food.  After 1 week go back to taking once a day Agree with cardiology evaluation as planned Follow with Dr. Delton Coombes next available after your pulmonary function testing so we can review those results together.

## 2023-07-09 NOTE — Patient Instructions (Signed)
We will arrange for pulmonary function testing We will perform a chest x-ray today Temporarily increase your Prilosec to 40 mg twice a day for 1 week.  Try to take this medication 1 hour around food.  After 1 week go back to taking once a day Agree with cardiology evaluation as planned Follow with Sherri Schultz next available after your pulmonary function testing so we can review those results together.

## 2023-07-09 NOTE — Assessment & Plan Note (Signed)
CPAP has been ordered, not yet started

## 2023-07-10 ENCOUNTER — Encounter: Payer: Self-pay | Admitting: Emergency Medicine

## 2023-07-10 DIAGNOSIS — J984 Other disorders of lung: Secondary | ICD-10-CM

## 2023-07-10 NOTE — Telephone Encounter (Signed)
Call report on CXR done 07/09/23  IMPRESSION: Patchy consolidative opacities throughout the left mid and lower lung and right mid lung concerning for infection. Small left pleural effusion. Underlying mass or malignant process not excluded. Given the complexity of findings, recommend further evaluation with chest CT.   These results will be called to the ordering clinician or representative by the Radiologist Assistant, and communication documented in the PACS or [employer].     Electronically Signed   By: Annia Belt M.D.   On: 07/09/2023 13:15

## 2023-07-10 NOTE — Telephone Encounter (Signed)
Spoke to PT. She is worried because imaging report says the following:  Given the complexity of findings, recommend further evaluation with chest CT.  Please call to advise PT if we will sched a CT scan.

## 2023-07-10 NOTE — Telephone Encounter (Signed)
Called patient to discuss. Please refer to My Chart Encounter

## 2023-07-10 NOTE — Telephone Encounter (Signed)
I reviewed the chest x-ray with the patient by phone.  She is not experiencing any fevers, cough, mucus production so inconsistent with an evolving pneumonia.  She did have what sounded like reflux and a possible aspiration event the other night prior to our office visit.  Question whether this may reflect aspiration pneumonitis?  Needs to be evaluated in more detail by CT.  I will order this now and we will try to get it done soon as possible.  Please set the patient up with a sooner office visit with either RB or APP so she can be seen to discuss the CT scan results.

## 2023-07-10 NOTE — Telephone Encounter (Signed)
Called pt - no answer, will try her back.  CXR with inflammatory infiltrate on L

## 2023-07-11 ENCOUNTER — Ambulatory Visit
Admission: RE | Admit: 2023-07-11 | Discharge: 2023-07-11 | Disposition: A | Discharge status: Home / Self Care | Payer: BC Managed Care – PPO | Source: Ambulatory Visit | Attending: Emergency Medicine | Admitting: Emergency Medicine

## 2023-07-11 ENCOUNTER — Telehealth: Payer: Self-pay | Admitting: Emergency Medicine

## 2023-07-11 DIAGNOSIS — J984 Other disorders of lung: Secondary | ICD-10-CM

## 2023-07-11 NOTE — Telephone Encounter (Signed)
I spoke to pt and made her an appt for 07/16/23 @ 9:45 AM. NFN

## 2023-07-11 NOTE — Telephone Encounter (Signed)
Please let the patient know that I have reviewed her CT chest and that there is inflammation present that probably needs more evaluation. I would like to see her next week to discuss. Thanks.

## 2023-07-11 NOTE — Telephone Encounter (Signed)
I have spoken with her about this. CT chest to be performed.

## 2023-07-16 ENCOUNTER — Ambulatory Visit (INDEPENDENT_AMBULATORY_CARE_PROVIDER_SITE_OTHER): Payer: BC Managed Care – PPO | Admitting: Emergency Medicine

## 2023-07-16 ENCOUNTER — Encounter: Payer: Self-pay | Admitting: Emergency Medicine

## 2023-07-16 VITALS — BP 124/82 | HR 72 | Ht 64.5 in | Wt 166.2 lb

## 2023-07-16 DIAGNOSIS — J90 Pleural effusion, not elsewhere classified: Secondary | ICD-10-CM | POA: Diagnosis not present

## 2023-07-16 DIAGNOSIS — R918 Other nonspecific abnormal finding of lung field: Secondary | ICD-10-CM

## 2023-07-16 DIAGNOSIS — R9389 Abnormal findings on diagnostic imaging of other specified body structures: Secondary | ICD-10-CM | POA: Diagnosis not present

## 2023-07-16 NOTE — Assessment & Plan Note (Signed)
She has been dealing with chest, shoulder discomfort, shortness of breath that is progressive over 2 months.  Prior to 2 months ago she really does not have any exertional limitations at all.  I saw her for an acute visit that sounds like she also has a component of acute upper airway obstruction.  This has recurred once since I saw her, less severe on that occasion.  Chest x-ray and then CT chest showed mediastinal adenopathy, bilateral patchy infiltrates of unclear cause.  We need to initiate the workup for inflammatory causes.  Send autoimmune labs today, HSP panel.  She also has a pleural effusion which I think should be tapped and sent for labs.  Finally we will arrange for bronchoscopy for random transbronchial biopsies in the lower lobes, EBUS to look for mediastinal adenopathy if present.  Explained to her the differential diagnosis.  I do not think the appearance is consistent with malignancy but certainly we need to rule out.

## 2023-07-16 NOTE — Patient Instructions (Signed)
We will perform lab work today We will arrange for a left thoracentesis to evaluate your pleural fluid We will arrange for bronchoscopy to evaluate pulmonary inflammation and enlarged lymph nodes.  This will be done under general anesthesia as an outpatient at [facility] endoscopy.  You will need a designated driver and someone to watch you at home that day after the procedure.  We will try to get this done on 08/11/2023. Follow with Dr. Delton Coombes in 1 month

## 2023-07-16 NOTE — Progress Notes (Signed)
Subjective:    Patient ID: Sherri Schultz, female    DOB: Sep 26, 1966, 57 y.o.   MRN: [id number]  HPI Acute office visit 07/09/2023: 57 year old never smoker who has a history of left breast DCIS, migraine headaches, melanoma (right leg), hypertension, anxiety.  She has been seen in our office for OSA, last 05/15/2023 w K Cobb video visit. She reports today that she has been having shoulder and neck pain for about 3 weeks, sometimes associated with some chest discomfort and dyspnea. She was started on prednisone, seems to have helped the shoulders some. This am she had some mid chest pain, to her throat. Some high pitched noise. Occasional overt reflux. She is winded with a flight of stairs.   Follow-up visit 07/16/2023 --Sherri Schultz is 57 with a history of migraine headaches, left breast DCIS, hypertension and anxiety, also OSA.  I saw her for an acute office visit 07/09/2023 when she experienced some throat discomfort and noise associated with sudden shortness of breath.  She also had some mid chest pain and exertional dyspnea.  I asked her to increase omeprazole to twice a day.  Her chest x-ray showed some left mid and lower lobe patchy infiltrates, possibly in the right midlung as well.  This prompted super D CT chest as below.  She is having progression of SOB with usual activity - worse over 2 months. Some dry cough. She has some L flank and L chest discomfort. No fever, chills, sweats.   Super D CT chest 07/11/2023 reviewed by me, shows enlarged mediastinal and hilar lymph nodes, noncalcified.  Small to moderate dependent left pleural effusion.  Diffuse central airway thickening with some base predominant peribronchovascular groundglass infiltrate and nodularity bilaterally.   Review of Systems As per HPI  Past Medical History:  Diagnosis Date   Anxiety    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   Cancer (HCC) 2013   right knee   Family history of brain cancer    Family history of breast  cancer    Family history of colon cancer    Family history of melanoma    Hypertension    Personal history of radiation therapy    Trimalleolar fracture of ankle, closed, left, initial encounter      Family History  Problem Relation Age of Onset   Breast cancer Mother 62   Diabetes Mother    Colon cancer Mother 67   Breast cancer Maternal Aunt        dx. >50   Diabetes Father    Hypertension Father    Heart attack Father    Arthritis Father    Diabetes Brother    Melanoma Brother 38   Diabetes Brother    Breast cancer Cousin        dx. 40s/50s   Brain cancer Maternal Uncle        dx. 20s   Breast cancer Maternal Aunt        dx. >50   Breast cancer Maternal Aunt        dx. >50   Cancer Maternal Uncle        unknown type, dx. >50   Breast cancer Cousin        dx. 40s/50s     Social History   Socioeconomic History   Marital status: Single    Spouse name: Not on file   Number of children: Not on file   Years of education: Not on file   Highest education  level: Not on file  Occupational History   Not on file  Tobacco Use   Smoking status: Never   Smokeless tobacco: Never  Substance and Sexual Activity   Alcohol use: Never   Drug use: Never   Sexual activity: Not on file  Other Topics Concern   Not on file  Social History Narrative   Not on file   Social Determinants of Health   Financial Resource Strain: Low Risk  (02/19/2023)   Received from [facility], [facility]   Overall Financial Resource Strain (CARDIA)    Difficulty of Paying Living Expenses: Not hard at all  Food Insecurity: No Food Insecurity (02/19/2023)   Received from [facility], [facility]   Hunger Vital Sign    Worried About Running Out of Food in the Last Year: Never true    Ran Out of Food in the Last Year: Never true  Transportation Needs: No Transportation Needs (02/19/2023)   Received from [facility], [facility]   PRAPARE - Transportation    Lack of Transportation  (Medical): No    Lack of Transportation (Non-Medical): No  Physical Activity: Not on file  Stress: Not on file  Social Connections: Unknown (04/08/2022)   Received from [facility], [facility]   Social Network    Social Network: Not on file  Intimate Partner Violence: Unknown (02/27/2022)   Received from [facility], [facility]   HITS    Physically Hurt: Not on file    Insult or Talk Down To: Not on file    Threaten Physical Harm: Not on file    Scream or Curse: Not on file    No mold exposure No birds.   No Known Allergies   Outpatient Medications Prior to Visit  Medication Sig Dispense Refill   baclofen (LIORESAL) 10 MG tablet Take 10 mg by mouth 3 (three) times daily.     botulinum toxin Type A (BOTOX) 200 units injection Inject 200 Units into the muscle every 3 (three) months.     levocetirizine (XYZAL) 5 MG tablet Take 5 mg by mouth every evening.     meclizine (ANTIVERT) 25 MG tablet Take 1 tablet by mouth 3 (three) times daily as needed.     omeprazole (PRILOSEC) 40 MG capsule Take 40 mg by mouth daily.     predniSONE (DELTASONE) 10 MG tablet Take 10 mg by mouth daily with breakfast.     prochlorperazine (COMPAZINE) 10 MG tablet Take 10 mg by mouth every 6 (six) hours as needed for nausea or vomiting.     propranolol (INDERAL) 40 MG tablet Take 40 mg by mouth daily.     Rimegepant Sulfate (NURTEC) 75 MG TBDP Take by mouth.     rosuvastatin (CRESTOR) 10 MG tablet Take 10 mg by mouth daily.     SUMAtriptan (IMITREX) 25 MG tablet May repeat in 2 hours if headache persists or recurs. 10 tablet 0   tamoxifen (NOLVADEX) 20 MG tablet Take 1 tablet (20 mg total) by mouth daily. 90 tablet 3   tiZANidine (ZANAFLEX) 4 MG capsule Take 4 mg by mouth 3 (three) times daily. 1/2 tab at night     valACYclovir (VALTREX) 1000 MG tablet Take 2 tablets by mouth 2 (two) times daily as needed.     venlafaxine XR (EFFEXOR-XR) 150 MG 24 hr capsule Take 1 capsule by mouth daily.  0   No  facility-administered medications prior to visit.        Objective:  Physical Exam Vitals:   07/16/23 0937  BP: 124/82  Pulse: 72  SpO2: 95%  Weight: 166 lb 3.2 oz (75.4 kg)  Height: 5' 4.5" (1.638 m)   Gen: Pleasant, well-nourished, in no distress,  normal affect  ENT: No lesions,  mouth clear,  oropharynx clear, no postnasal drip  Neck: No JVD, no stridor  Lungs: No use of accessory muscles, no crackles or wheezing on normal respiration, no wheeze on forced expiration  Cardiovascular: RRR, heart sounds normal, no murmur or gallops, no peripheral edema  Musculoskeletal: No deformities, no cyanosis or clubbing  Neuro: alert, awake, non focal  Skin: Warm, no lesions or rash      Assessment & Plan:  Abnormal CT of the chest She has been dealing with chest, shoulder discomfort, shortness of breath that is progressive over 2 months.  Prior to 2 months ago she really does not have any exertional limitations at all.  I saw her for an acute visit that sounds like she also has a component of acute upper airway obstruction.  This has recurred once since I saw her, less severe on that occasion.  Chest x-ray and then CT chest showed mediastinal adenopathy, bilateral patchy infiltrates of unclear cause.  We need to initiate the workup for inflammatory causes.  Send autoimmune labs today, HSP panel.  She also has a pleural effusion which I think should be tapped and sent for labs.  Finally we will arrange for bronchoscopy for random transbronchial biopsies in the lower lobes, EBUS to look for mediastinal adenopathy if present.  Explained to her the differential diagnosis.  I do not think the appearance is consistent with malignancy but certainly we need to rule out.  Time spent 40 minutes  Levy Pupa, MD, PhD 07/16/2023, 11:00 AM [facility] [phone] or if no answer before 7:00PM call [phone] For any issues after 7:00PM please call eLink  [phone]

## 2023-07-18 ENCOUNTER — Ambulatory Visit (HOSPITAL_COMMUNITY)
Admission: RE | Admit: 2023-07-18 | Discharge: 2023-07-18 | Disposition: A | Discharge status: Home / Self Care | Payer: BC Managed Care – PPO | Source: Ambulatory Visit | Attending: Radiology | Admitting: Radiology

## 2023-07-18 ENCOUNTER — Ambulatory Visit (HOSPITAL_COMMUNITY)
Admission: RE | Admit: 2023-07-18 | Discharge: 2023-07-18 | Disposition: A | Discharge status: Home / Self Care | Payer: BC Managed Care – PPO | Source: Ambulatory Visit | Attending: Emergency Medicine | Admitting: Emergency Medicine

## 2023-07-18 ENCOUNTER — Telehealth: Payer: Self-pay | Admitting: Adult Health

## 2023-07-18 ENCOUNTER — Encounter: Payer: Self-pay | Admitting: Emergency Medicine

## 2023-07-18 DIAGNOSIS — J910 Malignant pleural effusion: Secondary | ICD-10-CM | POA: Insufficient documentation

## 2023-07-18 DIAGNOSIS — R0789 Other chest pain: Secondary | ICD-10-CM

## 2023-07-18 DIAGNOSIS — Z85820 Personal history of malignant melanoma of skin: Secondary | ICD-10-CM | POA: Diagnosis not present

## 2023-07-18 DIAGNOSIS — C801 Malignant (primary) neoplasm, unspecified: Secondary | ICD-10-CM | POA: Diagnosis not present

## 2023-07-18 DIAGNOSIS — Z853 Personal history of malignant neoplasm of breast: Secondary | ICD-10-CM | POA: Diagnosis not present

## 2023-07-18 DIAGNOSIS — J90 Pleural effusion, not elsewhere classified: Secondary | ICD-10-CM

## 2023-07-18 LAB — LACTATE DEHYDROGENASE, PLEURAL OR PERITONEAL FLUID: LD, Fluid: 193 U/L — ABNORMAL HIGH (ref 3–23)

## 2023-07-18 LAB — BODY FLUID CELL COUNT WITH DIFFERENTIAL
Eos, Fluid: 7 %
Lymphs, Fluid: 79 %
Monocyte-Macrophage-Serous Fluid: 10 % — ABNORMAL LOW (ref 50–90)
Neutrophil Count, Fluid: 4 % (ref 0–25)
Total Nucleated Cell Count, Fluid: 1671 cu mm — ABNORMAL HIGH (ref 0–1000)

## 2023-07-18 LAB — GLUCOSE, PLEURAL OR PERITONEAL FLUID: Glucose, Fluid: 90 mg/dL

## 2023-07-18 LAB — GRAM STAIN

## 2023-07-18 LAB — PROTEIN, PLEURAL OR PERITONEAL FLUID: Total protein, fluid: 4.7 g/dL

## 2023-07-18 MED ORDER — LIDOCAINE HCL 1 % IJ SOLN
INTRAMUSCULAR | Status: AC
  Filled 2023-07-18: qty 20

## 2023-07-18 NOTE — Telephone Encounter (Signed)
Spoke with patient. She complains of sharp pain under left breast. Pain is worse when taking a deep breath or any activity. Patient had a  thoracentesis today and is now concerned. Patient is wanting to know if this is normal  Dr. Delton Coombes please advise?

## 2023-07-18 NOTE — Procedures (Signed)
Ultrasound-guided diagnostic and therapeutic left thoracentesis performed yielding 600 cc of yellow fluid. No immediate complications. Follow-up chest x-ray pending. The fluid was sent to the lab for preordered studies. EBL< 2 cc.

## 2023-07-18 NOTE — Telephone Encounter (Signed)
Pt had THORACENTESIS  today and pt started to get up after sitting and had a sharp apian under her breast and didn't know if that was normal but she was concerned

## 2023-07-19 LAB — ANA+ENA+DNA/DS+SCL 70+SJOSSA/B
ANA Titer 1: POSITIVE — AB
ENA RNP Ab: 0.3 AI (ref 0.0–0.9)
ENA SM Ab Ser-aCnc: <0.2 AI (ref 0.0–0.9)
ENA SSA (RO) Ab: <0.2 AI (ref 0.0–0.9)
ENA SSB (LA) Ab: <0.2 AI (ref 0.0–0.9)
Scleroderma (Scl-70) (ENA) Antibody, IgG: <0.2 AI (ref 0.0–0.9)
dsDNA Ab: <1 [IU]/mL (ref 0–9)

## 2023-07-19 LAB — FANA STAINING PATTERNS: Speckled Pattern: 1:80 {titer}

## 2023-07-21 ENCOUNTER — Ambulatory Visit (INDEPENDENT_AMBULATORY_CARE_PROVIDER_SITE_OTHER): Payer: BC Managed Care – PPO

## 2023-07-21 DIAGNOSIS — R0789 Other chest pain: Secondary | ICD-10-CM | POA: Diagnosis not present

## 2023-07-21 LAB — ACID FAST SMEAR (AFB, MYCOBACTERIA): Acid Fast Smear: NEGATIVE

## 2023-07-21 NOTE — Telephone Encounter (Signed)
I called her to discuss. We are checking a repeat CXR today

## 2023-07-21 NOTE — Telephone Encounter (Signed)
Discussed preliminary thoracentesis results with the patient, consistent with a lymphocytic exudate, cytology pending.  She is still having some pleuritic chest discomfort under her left breast, cough.  I think she needs a repeat chest x-ray and I will order now.  Continue to follow-up pleural fluid results

## 2023-07-22 ENCOUNTER — Encounter: Payer: Self-pay | Admitting: Emergency Medicine

## 2023-07-23 LAB — CULTURE, BODY FLUID W GRAM STAIN -BOTTLE: Culture: NO GROWTH

## 2023-07-25 LAB — ANCA SCREEN W REFLEX TITER: ANCA SCREEN: NEGATIVE

## 2023-07-25 LAB — HYPERSENSITIVITY PNUEMONITIS PROFILE
ASPERGILLUS FUMIGATUS: NEGATIVE
Faenia retivirgula: NEGATIVE
Pigeon Serum: NEGATIVE
S. VIRIDIS: NEGATIVE
T. CANDIDUS: NEGATIVE
T. VULGARIS: NEGATIVE

## 2023-07-25 LAB — RHEUMATOID FACTOR: Rheumatoid fact SerPl-aCnc: 10 [IU]/mL (ref <14)

## 2023-07-25 LAB — ANGIOTENSIN CONVERTING ENZYME: Angiotensin-Converting Enzyme: 24 U/L (ref 9–67)

## 2023-07-25 NOTE — Telephone Encounter (Signed)
Could try aspercreme to the affected area. We could also try ordering lidocaine patches for her if she would like

## 2023-07-29 ENCOUNTER — Telehealth: Payer: Self-pay | Admitting: Emergency Medicine

## 2023-07-29 ENCOUNTER — Emergency Department (HOSPITAL_COMMUNITY): Payer: BC Managed Care – PPO

## 2023-07-29 ENCOUNTER — Telehealth: Payer: Self-pay | Admitting: *Deleted

## 2023-07-29 ENCOUNTER — Encounter: Payer: Self-pay | Admitting: Emergency Medicine

## 2023-07-29 ENCOUNTER — Other Ambulatory Visit: Payer: Self-pay

## 2023-07-29 ENCOUNTER — Ambulatory Visit
Admission: EM | Admit: 2023-07-29 | Discharge: 2023-07-29 | Disposition: A | Discharge status: Home / Self Care | Payer: BC Managed Care – PPO | Attending: Internal Medicine | Admitting: Internal Medicine

## 2023-07-29 ENCOUNTER — Encounter (HOSPITAL_COMMUNITY): Payer: Self-pay

## 2023-07-29 ENCOUNTER — Inpatient Hospital Stay (HOSPITAL_COMMUNITY)
Admission: EM | Admit: 2023-07-29 | Discharge: 2023-08-03 | DRG: 180 | Disposition: A | Discharge status: Home / Self Care | Payer: BC Managed Care – PPO | Attending: Internal Medicine | Admitting: Internal Medicine

## 2023-07-29 DIAGNOSIS — Z7952 Long term (current) use of systemic steroids: Secondary | ICD-10-CM | POA: Diagnosis not present

## 2023-07-29 DIAGNOSIS — I10 Essential (primary) hypertension: Secondary | ICD-10-CM | POA: Diagnosis present

## 2023-07-29 DIAGNOSIS — Z800 Family history of malignant neoplasm of digestive organs: Secondary | ICD-10-CM

## 2023-07-29 DIAGNOSIS — Z808 Family history of malignant neoplasm of other organs or systems: Secondary | ICD-10-CM | POA: Diagnosis not present

## 2023-07-29 DIAGNOSIS — G4733 Obstructive sleep apnea (adult) (pediatric): Secondary | ICD-10-CM | POA: Diagnosis present

## 2023-07-29 DIAGNOSIS — Z803 Family history of malignant neoplasm of breast: Secondary | ICD-10-CM

## 2023-07-29 DIAGNOSIS — Z853 Personal history of malignant neoplasm of breast: Secondary | ICD-10-CM

## 2023-07-29 DIAGNOSIS — M8458XA Pathological fracture in neoplastic disease, other specified site, initial encounter for fracture: Secondary | ICD-10-CM | POA: Diagnosis present

## 2023-07-29 DIAGNOSIS — E876 Hypokalemia: Secondary | ICD-10-CM | POA: Diagnosis present

## 2023-07-29 DIAGNOSIS — C7801 Secondary malignant neoplasm of right lung: Principal | ICD-10-CM | POA: Diagnosis present

## 2023-07-29 DIAGNOSIS — Z923 Personal history of irradiation: Secondary | ICD-10-CM | POA: Diagnosis not present

## 2023-07-29 DIAGNOSIS — C7800 Secondary malignant neoplasm of unspecified lung: Secondary | ICD-10-CM | POA: Diagnosis present

## 2023-07-29 DIAGNOSIS — C7951 Secondary malignant neoplasm of bone: Secondary | ICD-10-CM | POA: Diagnosis present

## 2023-07-29 DIAGNOSIS — J9601 Acute respiratory failure with hypoxia: Secondary | ICD-10-CM | POA: Diagnosis present

## 2023-07-29 DIAGNOSIS — K219 Gastro-esophageal reflux disease without esophagitis: Secondary | ICD-10-CM | POA: Diagnosis present

## 2023-07-29 DIAGNOSIS — Z85820 Personal history of malignant melanoma of skin: Secondary | ICD-10-CM | POA: Diagnosis not present

## 2023-07-29 DIAGNOSIS — C801 Malignant (primary) neoplasm, unspecified: Secondary | ICD-10-CM | POA: Diagnosis present

## 2023-07-29 DIAGNOSIS — Z1152 Encounter for screening for COVID-19: Secondary | ICD-10-CM | POA: Diagnosis not present

## 2023-07-29 DIAGNOSIS — R0602 Shortness of breath: Secondary | ICD-10-CM | POA: Diagnosis present

## 2023-07-29 DIAGNOSIS — Z8261 Family history of arthritis: Secondary | ICD-10-CM | POA: Diagnosis not present

## 2023-07-29 DIAGNOSIS — G8929 Other chronic pain: Secondary | ICD-10-CM | POA: Diagnosis present

## 2023-07-29 DIAGNOSIS — D0512 Intraductal carcinoma in situ of left breast: Secondary | ICD-10-CM | POA: Diagnosis present

## 2023-07-29 DIAGNOSIS — Z8249 Family history of ischemic heart disease and other diseases of the circulatory system: Secondary | ICD-10-CM | POA: Diagnosis not present

## 2023-07-29 DIAGNOSIS — R110 Nausea: Secondary | ICD-10-CM | POA: Diagnosis present

## 2023-07-29 DIAGNOSIS — J90 Pleural effusion, not elsewhere classified: Secondary | ICD-10-CM

## 2023-07-29 DIAGNOSIS — Z79899 Other long term (current) drug therapy: Secondary | ICD-10-CM

## 2023-07-29 DIAGNOSIS — J910 Malignant pleural effusion: Secondary | ICD-10-CM | POA: Diagnosis present

## 2023-07-29 DIAGNOSIS — K5900 Constipation, unspecified: Secondary | ICD-10-CM | POA: Diagnosis present

## 2023-07-29 DIAGNOSIS — F40240 Claustrophobia: Secondary | ICD-10-CM | POA: Diagnosis present

## 2023-07-29 DIAGNOSIS — Z833 Family history of diabetes mellitus: Secondary | ICD-10-CM

## 2023-07-29 DIAGNOSIS — C7802 Secondary malignant neoplasm of left lung: Secondary | ICD-10-CM | POA: Diagnosis present

## 2023-07-29 DIAGNOSIS — R0600 Dyspnea, unspecified: Secondary | ICD-10-CM

## 2023-07-29 LAB — TROPONIN I (HIGH SENSITIVITY): Troponin I (High Sensitivity): 4 ng/L (ref <18)

## 2023-07-29 LAB — CBC
HCT: 39.9 % (ref 36.0–46.0)
HCT: 44 % (ref 36.0–46.0)
Hemoglobin: 12.9 g/dL (ref 12.0–15.0)
Hemoglobin: 14.1 g/dL (ref 12.0–15.0)
MCH: 30.1 pg (ref 26.0–34.0)
MCH: 30.8 pg (ref 26.0–34.0)
MCHC: 32.3 g/dL (ref 30.0–36.0)
MCHC: 32 g/dL (ref 30.0–36.0)
MCV: 93 fL (ref 80.0–100.0)
MCV: 96.1 fL (ref 80.0–100.0)
Platelets: 418 10*3/uL — ABNORMAL HIGH (ref 150–400)
Platelets: 356 10*3/uL (ref 150–400)
RBC: 4.29 MIL/uL (ref 3.87–5.11)
RBC: 4.58 MIL/uL (ref 3.87–5.11)
RDW: 12.5 % (ref 11.5–15.5)
RDW: 12.5 % (ref 11.5–15.5)
WBC: 9.7 10*3/uL (ref 4.0–10.5)
WBC: 11.5 10*3/uL — ABNORMAL HIGH (ref 4.0–10.5)
nRBC: 0 % (ref 0.0–0.2)
nRBC: 0 % (ref 0.0–0.2)

## 2023-07-29 LAB — BRAIN NATRIURETIC PEPTIDE: B Natriuretic Peptide: 22.4 pg/mL (ref 0.0–100.0)

## 2023-07-29 LAB — RESPIRATORY PANEL BY PCR

## 2023-07-29 LAB — TSH: TSH: 1.725 u[IU]/mL (ref 0.350–4.500)

## 2023-07-29 LAB — COMPREHENSIVE METABOLIC PANEL
ALT: 15 U/L (ref 0–44)
AST: 27 U/L (ref 15–41)
Albumin: 3.2 g/dL — ABNORMAL LOW (ref 3.5–5.0)
Alkaline Phosphatase: 92 U/L (ref 38–126)
Anion gap: 13 (ref 5–15)
BUN: 7 mg/dL (ref 6–20)
CO2: 26 mmol/L (ref 22–32)
Calcium: 9.3 mg/dL (ref 8.9–10.3)
Chloride: 98 mmol/L (ref 98–111)
Creatinine, Ser: 0.72 mg/dL (ref 0.44–1.00)
GFR, Estimated: >60 mL/min (ref >60)
Glucose, Bld: 92 mg/dL (ref 70–99)
Potassium: 3.4 mmol/L — ABNORMAL LOW (ref 3.5–5.1)
Sodium: 137 mmol/L (ref 135–145)
Total Bilirubin: 0.6 mg/dL (ref 0.3–1.2)
Total Protein: 7.1 g/dL (ref 6.5–8.1)

## 2023-07-29 LAB — MAGNESIUM: Magnesium: 1.9 mg/dL (ref 1.7–2.4)

## 2023-07-29 LAB — CREATININE, SERUM
Creatinine, Ser: 0.77 mg/dL (ref 0.44–1.00)
GFR, Estimated: >60 mL/min (ref >60)

## 2023-07-29 LAB — SARS CORONAVIRUS 2 BY RT PCR: SARS Coronavirus 2 by RT PCR: NEGATIVE

## 2023-07-29 MED ORDER — ENOXAPARIN SODIUM 40 MG/0.4ML IJ SOSY
40.0000 mg | PREFILLED_SYRINGE | INTRAMUSCULAR | Status: DC
  Administered 2023-07-29 – 2023-08-02 (×5): 40 mg via SUBCUTANEOUS
  Filled 2023-07-29 (×5): qty 0.4

## 2023-07-29 MED ORDER — ACETAMINOPHEN 650 MG RE SUPP: 650.0000 mg | Freq: Four times a day (QID) | RECTAL | Status: DC | PRN

## 2023-07-29 MED ORDER — PROPRANOLOL HCL ER 80 MG PO CP24
80.0000 mg | ORAL_CAPSULE | Freq: Every day | ORAL | Status: DC
  Administered 2023-07-29 – 2023-07-31 (×3): 80 mg via ORAL
  Filled 2023-07-29 (×4): qty 1

## 2023-07-29 MED ORDER — ALBUTEROL SULFATE (2.5 MG/3ML) 0.083% IN NEBU
2.5000 mg | INHALATION_SOLUTION | Freq: Four times a day (QID) | RESPIRATORY_TRACT | Status: DC
  Filled 2023-07-29: qty 3

## 2023-07-29 MED ORDER — ONDANSETRON HCL 4 MG PO TABS: 4.0000 mg | ORAL_TABLET | Freq: Four times a day (QID) | ORAL | Status: DC | PRN

## 2023-07-29 MED ORDER — POTASSIUM CHLORIDE CRYS ER 20 MEQ PO TBCR
40.0000 meq | EXTENDED_RELEASE_TABLET | Freq: Once | ORAL | Status: AC
  Administered 2023-07-29: 40 meq via ORAL
  Filled 2023-07-29: qty 2

## 2023-07-29 MED ORDER — ONDANSETRON HCL 4 MG/2ML IJ SOLN
4.0000 mg | Freq: Four times a day (QID) | INTRAMUSCULAR | Status: DC | PRN
  Administered 2023-08-02: 4 mg via INTRAVENOUS
  Filled 2023-07-29: qty 2

## 2023-07-29 MED ORDER — IOHEXOL 350 MG/ML SOLN
100.0000 mL | Freq: Once | INTRAVENOUS | Status: AC | PRN
  Administered 2023-07-29: 100 mL via INTRAVENOUS

## 2023-07-29 MED ORDER — VENLAFAXINE HCL ER 150 MG PO CP24
150.0000 mg | ORAL_CAPSULE | Freq: Every day | ORAL | Status: DC
  Administered 2023-07-29 – 2023-08-03 (×6): 150 mg via ORAL
  Filled 2023-07-29: qty 1
  Filled 2023-07-29: qty 2
  Filled 2023-07-29: qty 1
  Filled 2023-07-29: qty 2
  Filled 2023-07-29: qty 1
  Filled 2023-07-29: qty 2

## 2023-07-29 MED ORDER — SODIUM CHLORIDE 0.9% FLUSH
3.0000 mL | Freq: Two times a day (BID) | INTRAVENOUS | Status: DC
  Administered 2023-07-29 – 2023-08-03 (×10): 3 mL via INTRAVENOUS

## 2023-07-29 MED ORDER — ALBUTEROL SULFATE (2.5 MG/3ML) 0.083% IN NEBU: 2.5000 mg | INHALATION_SOLUTION | Freq: Four times a day (QID) | RESPIRATORY_TRACT | Status: DC | PRN

## 2023-07-29 MED ORDER — MORPHINE SULFATE (PF) 2 MG/ML IV SOLN
2.0000 mg | INTRAVENOUS | Status: DC | PRN
  Administered 2023-07-29 – 2023-08-03 (×16): 2 mg via INTRAVENOUS
  Filled 2023-07-29 (×16): qty 1

## 2023-07-29 MED ORDER — ACETAMINOPHEN 325 MG PO TABS
650.0000 mg | ORAL_TABLET | Freq: Four times a day (QID) | ORAL | Status: DC | PRN
  Administered 2023-07-31: 650 mg via ORAL
  Filled 2023-07-29: qty 2

## 2023-07-29 MED ORDER — PANTOPRAZOLE SODIUM 40 MG PO TBEC
40.0000 mg | DELAYED_RELEASE_TABLET | Freq: Every day | ORAL | Status: DC
  Administered 2023-07-29 – 2023-08-03 (×6): 40 mg via ORAL
  Filled 2023-07-29 (×6): qty 1

## 2023-07-29 NOTE — Telephone Encounter (Signed)
Patient is experiencing shortness of breath. There are no appointments for today. She was advised to go to urgent care.

## 2023-07-29 NOTE — Progress Notes (Signed)
Pt says she does not take breathing treatments at home. She does not want them at this time. Offered patient a cpap and she says she has never worn one. Refused.

## 2023-07-29 NOTE — H&P (Signed)
History and Physical    Sherri Schultz OZH:[id number] DOB: Mar 19, 1966 DOA: 07/29/2023  PCP: Elizabeth Palau, FNP  Patient coming from: Home  I have personally briefly reviewed patient's old medical records in [facility]  Chief Complaint: Multiple complaints, including shortness of breath and pain in bilateral shoulder and neck.  HPI: Sherri Schultz is a 57 y.o. female with medical history significant of never smoker, OSA (CPAP ordered), left breast CA, migraines, melanoma (right leg), HTN, and anxiety who was sent from UC due to hypoxia.  Reportedly, patient was first seen by Dr. Delton Coombes in the office on 07/09/2023 with complaint of exertional shortness of breath with bilateral shoulder neck and intermittent chest pain and voice change for several weeks. Pulmonary workup initiated, found to have abnormal CXR, empirically treated for possible GERD/ pneumonitis.  Follow-up chest CT which showed mediastinal adenopathy with patchy bilateral infiltrates with left pleural effusion.  HSP panel, fungal, and autoimmune labs negative except positive ANA.  Planned EBUS for 08/11/2023 with Dr. Delton Coombes.  Underwent IR left thoracentesis on 8/23 yielding 600 ml of yellow fluid; pleural labs consistent with lymphocytic exudate by Light's criteria, however cytology resulted on 8/30 positive for adenocarcinoma.    Of note, history of left breast cancer (DCIS dx 11/2019) s/p bilateral lumpectomies, adjuvant radiation and maintained on tamoxifen since 05/2020 followed by Dr. Al Pimple.  Patient reports she stopped taking her tamoxifen one year ago. Denies any recent fever, chills, productive cough/ hemoptysis, falls, LE edema/ pain/ swelling.  Has chronic low back pain.   She initially called PCCM office, she was sent to urgent care center, she was found to be hypoxic so she was sent to the ED for further workup.  Patient continues to complain of shortness of breath, worse since last 2 days, bilateral shoulder pain and neck pain.   No other complaint.  ED Course: Upon arrival to ED, she was hemodynamically stable except hypoxic saturating 88% on room air requiring 2 L of oxygen.  Patient was seen by Dr. Delton Coombes in consultation and recommended admission to hospitalist service.  He has ordered CTA chest to rule out PE as well as CT cervical spine due to neck pain to look for metastasis or any other pathology.  CBC within normal range.  Mild hypokalemia but BMP otherwise normal.  Troponin negative.  EKG with no acute ST-T wave changes.  No other workup has been done and patient has not received any other medications either.  Review of Systems: As per HPI otherwise negative.    Past Medical History:  Diagnosis Date   Anxiety    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   Cancer (HCC) 2013   right knee   Family history of brain cancer    Family history of breast cancer    Family history of colon cancer    Family history of melanoma    Hypertension    Personal history of radiation therapy    Trimalleolar fracture of ankle, closed, left, initial encounter     Past Surgical History:  Procedure Laterality Date   BREAST BIOPSY Right 10+ yrs ago   benign   BREAST BIOPSY Right 01/21/2020   x2   BREAST BIOPSY Left 12/14/2019   x2   BREAST CYST ASPIRATION Right 03/12/2021   BREAST EXCISIONAL BIOPSY Right 02/15/2020   BREAST LUMPECTOMY Left 02/15/2020   BREAST LUMPECTOMY WITH RADIOACTIVE SEED LOCALIZATION Left 02/15/2020   Procedure: LEFT BREAST LUMPECTOMY WITH RADIOACTIVE SEED LOCALIZATION;  Surgeon: Donell Beers,  Darrick Huntsman, MD;  Location: [facility];  Service: General;  Laterality: Left;   MELANOMA EXCISION Right 2013   knee   ORIF ANKLE FRACTURE Left 05/06/2018   Procedure: OPEN REDUCTION INTERNAL FIXATION (ORIF) LEFT TRIMALLEOLAR ANKLE FRACTURE;  Surgeon: Tarry Kos, MD;  Location: [facility];  Service: Orthopedics;  Laterality: Left;   RADIOACTIVE SEED GUIDED EXCISIONAL BREAST BIOPSY Right  02/15/2020   Procedure: RIGHT BREAST RADIOACTIVE SEED LOCALIZATION EXCISIONAL BIOPSY X 2;  Surgeon: Almond Lint, MD;  Location: [facility];  Service: General;  Laterality: Right;     reports that she has never smoked. She has never used smokeless tobacco. She reports that she does not drink alcohol and does not use drugs.  No Known Allergies  Family History  Problem Relation Age of Onset   Breast cancer Mother 72   Diabetes Mother    Colon cancer Mother 77   Breast cancer Maternal Aunt        dx. >50   Diabetes Father    Hypertension Father    Heart attack Father    Arthritis Father    Diabetes Brother    Melanoma Brother 30   Diabetes Brother    Breast cancer Cousin        dx. 40s/50s   Brain cancer Maternal Uncle        dx. 20s   Breast cancer Maternal Aunt        dx. >50   Breast cancer Maternal Aunt        dx. >50   Cancer Maternal Uncle        unknown type, dx. >50   Breast cancer Cousin        dx. 40s/50s    Prior to Admission medications   Medication Sig Start Date End Date Taking? Authorizing Provider  botulinum toxin Type A (BOTOX) 200 units injection Inject 200 Units into the muscle every 3 (three) months. 06/05/22  Yes [provider]  nitroGLYCERIN (NITROSTAT) 0.4 MG SL tablet Place 0.4 mg under the tongue every 5 (five) minutes as needed for chest pain. 07/08/23  Yes [provider]  omeprazole (PRILOSEC) 40 MG capsule Take 40 mg by mouth daily as needed (Indigestion). 07/20/20  Yes [provider]  propranolol ER (INDERAL LA) 80 MG 24 hr capsule Take 80 mg by mouth daily.   Yes [provider]  Rimegepant Sulfate (NURTEC) 75 MG TBDP Take by mouth.   Yes [provider]  tiZANidine (ZANAFLEX) 4 MG capsule Take 2 mg by mouth at bedtime as needed for muscle spasms.   Yes [provider]  valACYclovir (VALTREX) 1000 MG tablet Take 2 tablets by mouth 2 (two) times daily as needed ([place]). 02/04/18   Yes [provider]  venlafaxine XR (EFFEXOR-XR) 150 MG 24 hr capsule Take 1 capsule by mouth daily. 03/08/18  Yes [provider]  predniSONE (DELTASONE) 10 MG tablet Take 10 mg by mouth daily with breakfast. Patient not taking: Reported on 07/29/2023    [provider]    Physical Exam: Vitals:   07/29/23 1500 07/29/23 1530 07/29/23 1535 07/29/23 1545  BP:   124/72 118/74  Pulse: (!) 101 84 65 85  Resp:   20 15  Temp:      TempSrc:      SpO2: 96% 96% 93% 96%  Weight:      Height:        Constitutional: NAD, calm, comfortable Vitals:  07/29/23 1500 07/29/23 1530 07/29/23 1535 07/29/23 1545  BP:   124/72 118/74  Pulse: (!) 101 84 65 85  Resp:   20 15  Temp:      TempSrc:      SpO2: 96% 96% 93% 96%  Weight:      Height:       Eyes: PERRL, lids and conjunctivae normal ENMT: Mucous membranes are moist. Posterior pharynx clear of any exudate or lesions.Normal dentition.  Neck: normal, supple, no masses, no thyromegaly Respiratory: Bilateral rhonchi and possibly crackles with diminished breath sounds at the left base. Cardiovascular: Regular rate and rhythm, no murmurs / rubs / gallops. No extremity edema. 2+ pedal pulses. No carotid bruits.  Abdomen: no tenderness, no masses palpated. No hepatosplenomegaly. Bowel sounds positive.  Musculoskeletal: no clubbing / cyanosis. No joint deformity upper and lower extremities. Good ROM, no contractures. Normal muscle tone.  Skin: no rashes, lesions, ulcers. No induration Neurologic: CN 2-12 grossly intact. Sensation intact, DTR normal. Strength 5/5 in all 4.  Psychiatric: Normal judgment and insight. Alert and oriented x 3. Normal mood.    Labs on Admission: I have personally reviewed following labs and imaging studies  CBC: Recent Labs  Lab 07/29/23 1349  WBC 9.7  HGB 12.9  HCT 39.9  MCV 93.0  PLT 418*   Basic Metabolic Panel: Recent Labs  Lab 07/29/23 1349  NA 137  K 3.4*  CL 98  CO2 26   GLUCOSE 92  BUN 7  CREATININE 0.72  CALCIUM 9.3   GFR: Estimated Creatinine Clearance: 78.7 mL/min (by C-G formula based on SCr of 0.72 mg/dL). Liver Function Tests: Recent Labs  Lab 07/29/23 1349  AST 27  ALT 15  ALKPHOS 92  BILITOT 0.6  PROT 7.1  ALBUMIN 3.2*   No results for input(s): "LIPASE", "AMYLASE" in the last 168 hours. No results for input(s): "AMMONIA" in the last 168 hours. Coagulation Profile: No results for input(s): "INR", "PROTIME" in the last 168 hours. Cardiac Enzymes: No results for input(s): "CKTOTAL", "CKMB", "CKMBINDEX", "TROPONINI" in the last 168 hours. BNP (last 3 results) No results for input(s): "PROBNP" in the last 8760 hours. HbA1C: No results for input(s): "HGBA1C" in the last 72 hours. CBG: No results for input(s): "GLUCAP" in the last 168 hours. Lipid Profile: No results for input(s): "CHOL", "HDL", "LDLCALC", "TRIG", "CHOLHDL", "LDLDIRECT" in the last 72 hours. Thyroid Function Tests: No results for input(s): "TSH", "T4TOTAL", "FREET4", "T3FREE", "THYROIDAB" in the last 72 hours. Anemia Panel: No results for input(s): "VITAMINB12", "FOLATE", "FERRITIN", "TIBC", "IRON", "RETICCTPCT" in the last 72 hours. Urine analysis: No results found for: "COLORURINE", "APPEARANCEUR", "LABSPEC", "PHURINE", "GLUCOSEU", "HGBUR", "BILIRUBINUR", "KETONESUR", "PROTEINUR", "UROBILINOGEN", "NITRITE", "LEUKOCYTESUR"  Radiological Exams on Admission: DG Chest 2 View  Result Date: 07/29/2023 CLINICAL DATA:  Wheezing and shortness of breath. Recent left-sided thoracentesis with evidence of malignant effusion. History of breast cancer. EXAM: CHEST - 2 VIEW COMPARISON:  Chest x-ray dated July 21, 2023. FINDINGS: The heart size and mediastinal contours are within normal limits. Fairly diffuse interstitial thickening is similar, with increasing irregular airspace opacities in the left lung. No pneumothorax or pleural effusion. No acute osseous abnormality. Similar  exaggerated thoracic kyphosis. IMPRESSION: 1. 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 Superimposed pneumonia in the left lung is possible. Electronically Signed   By: Obie Dredge M.D.   On: 07/29/2023 14:01     Assessment/Plan Principal  Problem:   Cancer with pulmonary metastases (HCC) Active Problems:   Shortness of breath   Acute hypoxic respiratory failure with recent history of newly diagnosed obstructive sleep apnea and bilateral pulmonary infiltrates concerning for adenocarcinoma of unknown primary: Results from recent pleural fluid on 07/18/2023 shows adenocarcinoma.  PCCM has seen the patient in the ED.  They are highly suspecting PE, CT chest is ordered.  Pulmonology recommends holding off to any antibiotics.  Continue oxygen supply and wean as able to.  I will order albuterol inhalers as needed as well.  Further management per PCCM.  May warrant expediting bronchoscopy and biopsy here.  Will likely require oncology consult when basic initial workup is completed.  Extensive workup has been ordered by PCCM already.  I have ordered COVID, respiratory viral panel as well.  Also checking BNP.  Bilateral shoulder and neck pain: PCCM is already ordered CT C-spine.  May need MRI based on the results.  I will order pain medications.  Hypokalemia: Will replace.  Check magnesium.  Anxiety disorder: Resume venlafaxine.  GERD: Resume PPI.  History of DCIS of left breast: Patient was on tamoxifen which she stopped a year ago.  Patient used to follow Dr. Al Pimple.  Obstructive sleep apnea: Will order CPAP at night and as needed.  Patient's PCP is from [facility].  Patient's home medication includes in detail.  I am not sure why she is taking that.  Will resume it for now.  DVT prophylaxis: enoxaparin (LOVENOX) injection 40 mg Start: 07/29/23  1600 Code Status: Full code Family Communication: None present at bedside.  Plan of care discussed with patient in length and he verbalized understanding and agreed with it. Disposition Plan: To be determined based on the workup. Consults called: PCCM  Hughie Closs MD [facility]  *Please note that this is a verbal dictation therefore any spelling or grammatical errors are due to the "Dragon [employer] One" system interpretation.  Please page via Amion and do not message via secure chat for urgent patient care matters. Secure chat can be used for non urgent patient care matters. 07/29/2023, 4:55 PM  To contact the attending provider between 7A-7P or the covering provider during after hours 7P-7A, please log into the web site [email or web address]

## 2023-07-29 NOTE — Telephone Encounter (Signed)
Converted from [employer] message:  My breathing has gotten so much worse. What should I do?

## 2023-07-29 NOTE — Progress Notes (Signed)
Dr. Yetta Barre from [facility] called and wanted to speak to Attending. RN messaged Dr. Jacqulyn Bath and left the phone number for Dr. Yetta Barre to give him a call back.  [phone]

## 2023-07-29 NOTE — Telephone Encounter (Signed)
Patient has arrived at [facility] for evaluation. Nothing further needed at this time.

## 2023-07-29 NOTE — Telephone Encounter (Signed)
See previous TE dated 07/29/23.

## 2023-07-29 NOTE — ED Notes (Signed)
IV team at bedside 

## 2023-07-29 NOTE — ED Notes (Signed)
Patient is being discharged from the Urgent Care and sent to the Emergency Department via POV . Per Cheri Rous NP, patient is in need of higher level of care due to sob and wheezing. Patient is aware and verbalizes understanding of plan of care.  Vitals:   07/29/23 1007  BP: 107/72  Pulse: 77  Resp: (!) 24  Temp: 98.5 F (36.9 C)  SpO2: (!) 86%

## 2023-07-29 NOTE — Consult Note (Signed)
NAME:  Sherri Schultz, MRN:  [id number], DOB:  [birth year], LOS: 0 ADMISSION DATE:  07/29/2023, CONSULTATION DATE:  07/29/23 REFERRING MD:  Trisha Mangle, PA, CHIEF COMPLAINT:  SOB/ hypoxia  History of Present Illness:   69 yoF with PMH of never smoker, OSA (CPAP ordered), left breast CA, migraines, melanoma (right leg), HTN, and anxiety who was sent from UC after being found hypoxic in the mid 80's on room with progressive SOB.  Also reports ongoing bilateral shoulder and neck pain with new radiation into right arm down to elbow without weakness or numbness since yesterday.  States its hard to lift her head off the bed at times.    Seen by Dr. Delton Coombes in our office 07/09/2023 after several weeks of exertional SOB with bilateral shoulder, neck and intermittent chest discomfort and voice change.  Pulmonary workup initiated, found to have abnormal CXR, empirically treated for possible GERD/ pneumonitis.  Follow-up chest CT which showed mediastinal adenopathy with patchy bilateral infiltrates with left pleural effusion.  HSP panel, fungal, and autoimmune labs negative except positive ANA.  Planned EBUS for 08/11/2023 with Dr. Delton Coombes.  Underwent IR left thoracentesis on 8/23 yielding 600 ml of yellow fluid; pleural labs consistent with lymphocytic exudate by Light's criteria, however cytology resulted on 8/30 positive for adenocarcinoma.    Of note, history of left breast cancer (DCIS dx 11/2019) s/p bilateral lumpectomies, adjuvant radiation and maintained on tamoxifen since 05/2020 followed by Dr. Al Pimple.  Patient reports she stopped taking her tamoxifen one year ago; oncology notes do not reflect this. Denies any recent fever, chills, productive cough/ hemoptysis, falls, LE edema/ pain/ swelling.  Has chronic low back pain.   Afebrile, normotensive and requiring 3L [place] without respiratory distress in ER.  Labs pending.  CXR showing progressive bilateral infiltrates, more extensive in left.  Pulmonary consulted for  further recommendations, likely to be admitted to [facility].   Pertinent  Medical History  Never smoker, OSA, left breast CA, migraines, melanoma (right leg), HTN, anxiety  Significant Hospital Events: Including procedures, antibiotic start and stop dates in addition to other pertinent events     Interim History / Subjective:   Objective   Blood pressure 117/71, pulse 80, temperature 98.4 F (36.9 C), resp. rate 16, height 5' 4.5" (1.638 m), weight 74.8 kg, last menstrual period 03/27/2011, SpO2 95%.       No intake or output data in the 24 hours ending 07/29/23 1321 Filed Weights   07/29/23 1244  Weight: 74.8 kg   Examination: General:  Pleasant well nourished adult female sitting upright on ER stretcher in NAD HEENT: MM pink/moist Neuro: Alert/ oriented, MAE spont> denies any UE/ LE numbness/ weakness CV: rr, no murmur PULM:  non labored, 3L [place] ~ 98%, clear anteriorly, few rales right base posterior, left base diminished Extremities: warm/dry, no obvious LE edema, erythema or tenderness Skin: no rashes  Resolved Hospital Problem list    Assessment & Plan:   Acute hypoxic respiratory failure  Extensive bilateral airspace opacities concerning for underlying malignant process with recent positive cytology for adenocarcinoma in left pleural effusion s/p IR thora 8/16.   - unclear if this represents metastasis given hx of breast cancer or secondary/primary malignant process.  Concerning given pt stopped her tamoxifen 1 year ago for unclear reasons.  - EBUS tentatively scheduled 9/16 with Dr. Delton Coombes P:  - will need to rule out acute PE with CTA PE chest study, pending CMET/ renal function.  This will also give [place] more  information on progressive changes/ pleural effusion and plans moving forward.   - supplemental O2 prn - doubt infectious process given absence of clinical s/s, afebrile.  Hold on empiric abx for now.   - Dr. Delton Coombes did discuss positive cytology of pleural fluid with  patient.  Will need to involve oncology pending further results/ imaging and plan of care.     Bilateral Neck/ shoulder pain - no significant improvement s/p prior course of steroids outpt - no objective weakness or subjective numbness or weakness - consider further imaging of Cspine given cancer hx and concern of metastasis; defer to primary team   PCCM will continue to follow.  Remainder per [facility].    Best Practice (right click and "Reselect all SmartList Selections" daily)  Per primary   Labs   CBC: No results for input(s): "WBC", "NEUTROABS", "HGB", "HCT", "MCV", "PLT" in the last 168 hours.  Basic Metabolic Panel: No results for input(s): "NA", "K", "CL", "CO2", "GLUCOSE", "BUN", "CREATININE", "CALCIUM", "MG", "PHOS" in the last 168 hours. GFR: CrCl cannot be calculated (Patient's most recent lab result is older than the maximum 21 days allowed.). No results for input(s): "PROCALCITON", "WBC", "LATICACIDVEN" in the last 168 hours.  Liver Function Tests: No results for input(s): "AST", "ALT", "ALKPHOS", "BILITOT", "PROT", "ALBUMIN" in the last 168 hours. No results for input(s): "LIPASE", "AMYLASE" in the last 168 hours. No results for input(s): "AMMONIA" in the last 168 hours.  ABG No results found for: "PHART", "PCO2ART", "PO2ART", "HCO3", "TCO2", "ACIDBASEDEF", "O2SAT"   Coagulation Profile: No results for input(s): "INR", "PROTIME" in the last 168 hours.  Cardiac Enzymes: No results for input(s): "CKTOTAL", "CKMB", "CKMBINDEX", "TROPONINI" in the last 168 hours.  HbA1C: No results found for: "HGBA1C"  CBG: No results for input(s): "GLUCAP" in the last 168 hours.  Review of Systems:   Review of Systems  Constitutional:  Negative for chills and fever.  Respiratory:  Positive for shortness of breath. Negative for cough, hemoptysis, sputum production and wheezing.   Cardiovascular:  Positive for chest pain. Negative for leg swelling.  Gastrointestinal:  Negative for  nausea and vomiting.  Musculoskeletal:  Positive for joint pain and neck pain. Negative for falls.  Neurological:  Negative for dizziness, sensory change, focal weakness and weakness.   Past Medical History:  She,  has a past medical history of Anxiety, Breast cancer (HCC) (2021), Cancer (HCC) (2013), Family history of brain cancer, Family history of breast cancer, Family history of colon cancer, Family history of melanoma, Hypertension, Personal history of radiation therapy, and Trimalleolar fracture of ankle, closed, left, initial encounter.   Surgical History:   Past Surgical History:  Procedure Laterality Date   BREAST BIOPSY Right 10+ yrs ago   benign   BREAST BIOPSY Right 01/21/2020   x2   BREAST BIOPSY Left 12/14/2019   x2   BREAST CYST ASPIRATION Right 03/12/2021   BREAST EXCISIONAL BIOPSY Right 02/15/2020   BREAST LUMPECTOMY Left 02/15/2020   BREAST LUMPECTOMY WITH RADIOACTIVE SEED LOCALIZATION Left 02/15/2020   Procedure: LEFT BREAST LUMPECTOMY WITH RADIOACTIVE SEED LOCALIZATION;  Surgeon: Almond Lint, MD;  Location: [facility];  Service: General;  Laterality: Left;   MELANOMA EXCISION Right 2013   knee   ORIF ANKLE FRACTURE Left 05/06/2018   Procedure: OPEN REDUCTION INTERNAL FIXATION (ORIF) LEFT TRIMALLEOLAR ANKLE FRACTURE;  Surgeon: Tarry Kos, MD;  Location: [facility];  Service: Orthopedics;  Laterality: Left;   RADIOACTIVE SEED GUIDED EXCISIONAL BREAST BIOPSY Right 02/15/2020  Procedure: RIGHT BREAST RADIOACTIVE SEED LOCALIZATION EXCISIONAL BIOPSY X 2;  Surgeon: Almond Lint, MD;  Location: [facility];  Service: General;  Laterality: Right;     Social History:   reports that she has never smoked. She has never used smokeless tobacco. She reports that she does not drink alcohol and does not use drugs.   Family History:  Her family history includes Arthritis in her father; Brain cancer in her maternal uncle; Breast  cancer in her cousin, cousin, maternal aunt, maternal aunt, and maternal aunt; Breast cancer (age of onset: 12) in her mother; Cancer in her maternal uncle; Colon cancer (age of onset: 46) in her mother; Diabetes in her brother, brother, father, and mother; Heart attack in her father; Hypertension in her father; Melanoma (age of onset: 69) in her brother.   Allergies No Known Allergies   Home Medications  Prior to Admission medications   Medication Sig Start Date End Date Taking? Authorizing Provider  baclofen (LIORESAL) 10 MG tablet Take 10 mg by mouth 3 (three) times daily.    [provider]  botulinum toxin Type A (BOTOX) 200 units injection Inject 200 Units into the muscle every 3 (three) months. 06/05/22   [provider]  levocetirizine (XYZAL) 5 MG tablet Take 5 mg by mouth every evening.    [provider]  meclizine (ANTIVERT) 25 MG tablet Take 1 tablet by mouth 3 (three) times daily as needed. 02/26/17   [provider]  omeprazole (PRILOSEC) 40 MG capsule Take 40 mg by mouth daily. 07/20/20   [provider]  predniSONE (DELTASONE) 10 MG tablet Take 10 mg by mouth daily with breakfast.    [provider]  prochlorperazine (COMPAZINE) 10 MG tablet Take 10 mg by mouth every 6 (six) hours as needed for nausea or vomiting.    [provider]  propranolol (INDERAL) 40 MG tablet Take 40 mg by mouth daily.    [provider]  Rimegepant Sulfate (NURTEC) 75 MG TBDP Take by mouth.    [provider]  rosuvastatin (CRESTOR) 10 MG tablet Take 10 mg by mouth daily.    [provider]  SUMAtriptan (IMITREX) 25 MG tablet May repeat in 2 hours if headache persists or recurs. 09/15/20   Magrinat, Valentino Hue, MD  tamoxifen (NOLVADEX) 20 MG tablet Take 1 tablet (20 mg total) by mouth daily. 04/12/22   Rachel Moulds, MD  tiZANidine (ZANAFLEX) 4 MG capsule Take 4 mg by mouth 3 (three) times daily. 1/2 tab at night     [provider]  valACYclovir (VALTREX) 1000 MG tablet Take 2 tablets by mouth 2 (two) times daily as needed. 02/04/18   [provider]  venlafaxine XR (EFFEXOR-XR) 150 MG 24 hr capsule Take 1 capsule by mouth daily. 03/08/18   [provider]     Critical care time: n/a       Posey Boyer, MSN, AG-ACNP-BC [facility] 07/29/2023, 3:00 PM  See Amion for pager If no response to pager , please call [phone] until 7pm After 7:00 pm call Elink  [phone]

## 2023-07-29 NOTE — ED Notes (Signed)
IV team consult placed, unable to obtain labs at this time. PA notified of situation.

## 2023-07-29 NOTE — ED Notes (Signed)
Patient transported to CT 

## 2023-07-29 NOTE — ED Triage Notes (Signed)
Pt c/o increase in SHOB, wheezing and uppper back, posterior neck pain that radiates down RUE-states sx x ~7months-sought medical care ~3 weeks ago-is being followed by pulmonology with bronchoscopy 9/16-states she contacted pulmonology office and was advised to go to [facility]

## 2023-07-29 NOTE — ED Notes (Signed)
ED TO INPATIENT HANDOFF REPORT  ED Nurse Name and Phone #:  Corliss Blacker, RN [phone]  S Name/Age/Gender Sherri Schultz 57 y.o. female Room/Bed: 025C/025C  Code Status   Code Status: Full Code  Home/SNF/Other Home Patient oriented to: self, place, time, and situation Is this baseline? Yes   Triage Complete: Triage complete  Chief Complaint Cancer with pulmonary metastases (HCC) [C78.00]  Triage Note Pt sent from urgent care for further eval of sob, wheezing, and upper back/neck pain radiating down R arm; sats 88% RA, placed on 3L O2, 94%; has upcoming appointment with pulmonology   Allergies No Known Allergies  Level of Care/Admitting Diagnosis ED Disposition     ED Disposition  Admit   Condition  --   Comment  Hospital Area: [facility] [[id number]]  Level of Care: Med-Surg [16]  May admit patient to [facility] or [facility] if equivalent level of care is available:: No  Covid Evaluation: Symptomatic Person Under Investigation (PUI) or recent exposure (last 10 days) *Testing Required*  Diagnosis: Cancer with pulmonary metastases [facility]) [[id number]]  Admitting Physician: Hughie Closs [[id number]]  Attending Physician: Hughie Closs [phone]  Certification:: I certify this patient will need inpatient services for at least 2 midnights  Expected Medical Readiness: 07/31/2023          B Medical/Surgery History Past Medical History:  Diagnosis Date   Anxiety    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   Cancer (HCC) 2013   right knee   Family history of brain cancer    Family history of breast cancer    Family history of colon cancer    Family history of melanoma    Hypertension    Personal history of radiation therapy    Trimalleolar fracture of ankle, closed, left, initial encounter    Past Surgical History:  Procedure Laterality Date   BREAST BIOPSY Right 10+ yrs ago   benign   BREAST BIOPSY Right 01/21/2020   x2   BREAST BIOPSY Left 12/14/2019    x2   BREAST CYST ASPIRATION Right 03/12/2021   BREAST EXCISIONAL BIOPSY Right 02/15/2020   BREAST LUMPECTOMY Left 02/15/2020   BREAST LUMPECTOMY WITH RADIOACTIVE SEED LOCALIZATION Left 02/15/2020   Procedure: LEFT BREAST LUMPECTOMY WITH RADIOACTIVE SEED LOCALIZATION;  Surgeon: Almond Lint, MD;  Location: [facility];  Service: General;  Laterality: Left;   MELANOMA EXCISION Right 2013   knee   ORIF ANKLE FRACTURE Left 05/06/2018   Procedure: OPEN REDUCTION INTERNAL FIXATION (ORIF) LEFT TRIMALLEOLAR ANKLE FRACTURE;  Surgeon: Tarry Kos, MD;  Location: [facility];  Service: Orthopedics;  Laterality: Left;   RADIOACTIVE SEED GUIDED EXCISIONAL BREAST BIOPSY Right 02/15/2020   Procedure: RIGHT BREAST RADIOACTIVE SEED LOCALIZATION EXCISIONAL BIOPSY X 2;  Surgeon: Almond Lint, MD;  Location: [facility];  Service: General;  Laterality: Right;     A IV Location/Drains/Wounds Patient Lines/Drains/Airways Status     Active Line/Drains/Airways     Name Placement date Placement time Site Days   Peripheral IV 07/29/23 20 G 1.88" Anterior;Right;Upper Arm 07/29/23  1330  Arm  less than 1            Intake/Output Last 24 hours No intake or output data in the 24 hours ending 07/29/23 1551  Labs/Imaging Results for orders placed or performed during the hospital encounter of 07/29/23 (from the past 48 hour(s))  CBC     Status: Abnormal   Collection Time: 07/29/23  1:49 PM  Result Value Ref Range   WBC 9.7 4.0 - 10.5 K/uL   RBC 4.29 3.87 - 5.11 MIL/uL   Hemoglobin 12.9 12.0 - 15.0 g/dL   HCT 95.2 84.1 - 32.4 %   MCV 93.0 80.0 - 100.0 fL   MCH 30.1 26.0 - 34.0 pg   MCHC 32.3 30.0 - 36.0 g/dL   RDW 40.1 02.7 - 25.3 %   Platelets 418 (H) 150 - 400 K/uL   nRBC 0.0 0.0 - 0.2 %    Comment: Performed at [facility], [address] [place], [place] [address]  Troponin I (High Sensitivity)     Status: None   Collection Time: 07/29/23   1:49 PM  Result Value Ref Range   Troponin I (High Sensitivity) 4 <18 ng/L    Comment: (NOTE) Elevated high sensitivity troponin I (hsTnI) values and significant  changes across serial measurements may suggest ACS but many other  chronic and acute conditions are known to elevate hsTnI results.  Refer to the "Links" section for chest pain algorithms and additional  guidance. Performed at [facility], [address], [place], [place] [address]   Comprehensive metabolic panel     Status: Abnormal   Collection Time: 07/29/23  1:49 PM  Result Value Ref Range   Sodium 137 135 - 145 mmol/L   Potassium 3.4 (L) 3.5 - 5.1 mmol/L   Chloride 98 98 - 111 mmol/L   CO2 26 22 - 32 mmol/L   Glucose, Bld 92 70 - 99 mg/dL    Comment: Glucose reference range applies only to samples taken after fasting for at least 8 hours.   BUN 7 6 - 20 mg/dL   Creatinine, Ser 5.95 0.44 - 1.00 mg/dL   Calcium 9.3 8.9 - 63.8 mg/dL   Total Protein 7.1 6.5 - 8.1 g/dL   Albumin 3.2 (L) 3.5 - 5.0 g/dL   AST 27 15 - 41 U/L   ALT 15 0 - 44 U/L   Alkaline Phosphatase 92 38 - 126 U/L   Total Bilirubin 0.6 0.3 - 1.2 mg/dL   GFR, Estimated >75 >64 mL/min    Comment: (NOTE) Calculated using the CKD-EPI Creatinine Equation (2021)    Anion gap 13 5 - 15    Comment: Performed at [facility], [address], [place], [place] [address]   DG Chest 2 View  Result Date: 07/29/2023 CLINICAL DATA:  Wheezing and shortness of breath. Recent left-sided thoracentesis with evidence of malignant effusion. History of breast cancer. EXAM: CHEST - 2 VIEW COMPARISON:  Chest x-ray dated July 21, 2023. FINDINGS: The heart size and mediastinal contours are within normal limits. Fairly diffuse interstitial thickening is similar, with increasing irregular airspace opacities in the left lung. No pneumothorax or pleural effusion. No acute osseous abnormality. Similar exaggerated thoracic kyphosis. IMPRESSION: 1. 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 Superimposed pneumonia in the left lung is possible. Electronically Signed   By: Obie Dredge M.D.   On: 07/29/2023 14:01    Pending Labs Unresulted Labs (From admission, onward)     Start     Ordered   08/05/23 0500  Creatinine, serum  (enoxaparin (LOVENOX)    CrCl >/= 30 ml/min)  Weekly,   R     Comments: while on enoxaparin therapy   Question:  Specimen collection method  Answer:  IV  Team=IV Team collect   07/29/23 1548   07/30/23 0500  Comprehensive metabolic panel  Tomorrow morning,   R       Question:  Specimen collection method  Answer:  IV Team=IV Team collect   07/29/23 1548   07/30/23 0500  CBC  Tomorrow morning,   R       Question:  Specimen collection method  Answer:  IV Team=IV Team collect   07/29/23 1548   07/29/23 1549  Magnesium  Once,   R       Question:  Specimen collection method  Answer:  IV Team=IV Team collect   07/29/23 1548   07/29/23 1549  TSH  Once,   R       Question:  Specimen collection method  Answer:  IV Team=IV Team collect   07/29/23 1548   07/29/23 1547  HIV Antibody (routine testing w rflx)  (HIV Antibody (Routine testing w reflex) panel)  Once,   R       Question:  Specimen collection method  Answer:  IV Team=IV Team collect   07/29/23 1548   07/29/23 1547  CBC  (enoxaparin (LOVENOX)    CrCl >/= 30 ml/min)  Once,   R       Comments: Baseline for enoxaparin therapy IF NOT ALREADY DRAWN.  Notify MD if PLT < 100 K.   Question:  Specimen collection method  Answer:  IV Team=IV Team collect   07/29/23 1548   07/29/23 1547  Creatinine, serum  (enoxaparin (LOVENOX)    CrCl >/= 30 ml/min)  Once,   R       Comments: Baseline for enoxaparin therapy IF NOT ALREADY DRAWN.   Question:  Specimen collection method  Answer:  IV Team=IV Team collect   07/29/23 1548   07/29/23  1546  Respiratory (~20 pathogens) panel by PCR  (Respiratory panel by PCR (~20 pathogens, ~24 hr TAT)  w precautions)  Once,   R        07/29/23 1545   07/29/23 1546  Brain natriuretic peptide  Once,   R       Question:  Specimen collection method  Answer:  IV Team=IV Team collect   07/29/23 1545   07/29/23 1545  SARS Coronavirus 2 by RT PCR (hospital order, performed in [facility] hospital lab) *cepheid single result test* Anterior Nasal Swab  (Tier 2 - SARS Coronavirus 2 by RT PCR (hospital order, performed in [facility] hospital lab) *cepheid single result test*)  Once,   R        07/29/23 1544            Vitals/Pain Today's Vitals   07/29/23 1430 07/29/23 1500 07/29/23 1530 07/29/23 1535  BP:    124/72  Pulse: 81 (!) 101 84 65  Resp:    20  Temp:      TempSrc:      SpO2: 95% 96% 96% 93%  Weight:      Height:      PainSc:        Isolation Precautions Droplet precaution  Medications Medications  pantoprazole (PROTONIX) EC tablet 40 mg (has no administration in time range)  venlafaxine XR (EFFEXOR-XR) 24 hr capsule 150 mg (has no administration in time range)  enoxaparin (LOVENOX) injection 40 mg (has no administration in time range)  sodium chloride flush (NS) 0.9 % injection 3 mL (has no administration in time range)  acetaminophen (TYLENOL) tablet 650 mg (has no administration in time range)  Or  acetaminophen (TYLENOL) suppository 650 mg (has no administration in time range)  ondansetron (ZOFRAN) tablet 4 mg (has no administration in time range)    Or  ondansetron (ZOFRAN) injection 4 mg (has no administration in time range)  albuterol (PROVENTIL) (2.5 MG/3ML) 0.083% nebulizer solution 2.5 mg (has no administration in time range)    Mobility walks     Focused Assessments         R Recommendations: See Admitting Provider Note  Report given to:   Additional Notes:

## 2023-07-29 NOTE — ED Triage Notes (Signed)
Pt sent from urgent care for further eval of sob, wheezing, and upper back/neck pain radiating down R arm; sats 88% RA, placed on 3L O2, 94%; has upcoming appointment with pulmonology

## 2023-07-29 NOTE — ED Provider Notes (Signed)
[place] EMERGENCY DEPARTMENT AT [facility] Provider Note   CSN: [id number] Arrival date & time: 07/29/23  1111     History  No chief complaint on file.   Sherri Schultz is a 57 y.o. female.  Patient complains of increasing shortness of breath.  Patient saw Dr. Delton Coombes pulmonology 2 weeks ago and had bronchoscopy.  Patient reports she has progressively gotten short of breath.  Patient states she was more short of breath today than she has been.  She called Dr. Kavin Leech office and was advised to go to urgent care.  Patient was found to be hypoxic at urgent care and sent to the emergency department for evaluation.  Patient complains of increasing shortness of breath for the last 4 days.  Patient has had a cough she denies any fever she denies any chills.  A past medical history of breast cancer as well as melanoma of her right leg.  The history is provided by the patient. No language interpreter was used.       Home Medications Prior to Admission medications   Medication Sig Start Date End Date Taking? Authorizing Provider  baclofen (LIORESAL) 10 MG tablet Take 10 mg by mouth 3 (three) times daily.    [provider]  botulinum toxin Type A (BOTOX) 200 units injection Inject 200 Units into the muscle every 3 (three) months. 06/05/22   [provider]  levocetirizine (XYZAL) 5 MG tablet Take 5 mg by mouth every evening.    [provider]  meclizine (ANTIVERT) 25 MG tablet Take 1 tablet by mouth 3 (three) times daily as needed. 02/26/17   [provider]  omeprazole (PRILOSEC) 40 MG capsule Take 40 mg by mouth daily. 07/20/20   [provider]  predniSONE (DELTASONE) 10 MG tablet Take 10 mg by mouth daily with breakfast.    [provider]  prochlorperazine (COMPAZINE) 10 MG tablet Take 10 mg by mouth every 6 (six) hours as needed for nausea or vomiting.    [provider]  propranolol (INDERAL) 40 MG tablet Take 40 mg by  mouth daily.    [provider]  Rimegepant Sulfate (NURTEC) 75 MG TBDP Take by mouth.    [provider]  rosuvastatin (CRESTOR) 10 MG tablet Take 10 mg by mouth daily.    [provider]  SUMAtriptan (IMITREX) 25 MG tablet May repeat in 2 hours if headache persists or recurs. 09/15/20   Magrinat, Valentino Hue, MD  tamoxifen (NOLVADEX) 20 MG tablet Take 1 tablet (20 mg total) by mouth daily. 04/12/22   Rachel Moulds, MD  tiZANidine (ZANAFLEX) 4 MG capsule Take 4 mg by mouth 3 (three) times daily. 1/2 tab at night    [provider]  valACYclovir (VALTREX) 1000 MG tablet Take 2 tablets by mouth 2 (two) times daily as needed. 02/04/18   [provider]  venlafaxine XR (EFFEXOR-XR) 150 MG 24 hr capsule Take 1 capsule by mouth daily. 03/08/18   [provider]      Allergies    Patient has no known allergies.    Review of Systems   Review of Systems  Respiratory:  Positive for cough and shortness of breath.   All other systems reviewed and are negative.   Physical Exam Updated Vital Signs BP 111/84   Pulse 78   Temp 97.8 F (36.6 C) (Oral)   Resp 18   Ht 5' 4.5" (1.638 m)   Wt 74.8 kg   LMP 03/27/2011  SpO2 95%   BMI 27.88 kg/m  Physical Exam Vitals and nursing note reviewed.  Constitutional:      Appearance: She is well-developed.  HENT:     Head: Normocephalic.     Right Ear: External ear normal.     Left Ear: External ear normal.     Nose: Nose normal.     Mouth/Throat:     Mouth: Mucous membranes are moist.  Cardiovascular:     Rate and Rhythm: Normal rate and regular rhythm.  Pulmonary:     Comments: Decreased breath sounds.  Abdominal:     General: Abdomen is flat. There is no distension.  Musculoskeletal:        General: Normal range of motion.     Cervical back: Normal range of motion.  Skin:    General: Skin is warm.  Neurological:     General: No focal deficit present.     Mental Status: She is alert and  oriented to person, place, and time.  Psychiatric:        Mood and Affect: Mood normal.     ED Results / Procedures / Treatments   Labs (all labs ordered are listed, but only abnormal results are displayed) Labs Reviewed  CBC  COMPREHENSIVE METABOLIC PANEL  TROPONIN I (HIGH SENSITIVITY)    EKG None  Radiology No results found.  Procedures Procedures    Medications Ordered in ED Medications - No data to display  ED Course/ Medical Decision Making/ A&P                                 Medical Decision Making Reports experiencing some chest discomfort and shortness of breath.  Patient reports that she was seen by Dr. Delton Coombes and had a biopsy.   Amount and/or Complexity of Data Reviewed Labs: ordered. Decision-making details documented in ED Course.    Details: CBC and cmet ordered reviewed and interpreted and are normal Radiology: ordered and independent interpretation performed. Decision-making details documented in ED Course.    Details: X-rays shows interstitial thickening with opacities           Final Clinical Impression(s) / ED Diagnoses Final diagnoses:  Shortness of breath  Dyspnea, unspecified type    Rx / DC Orders ED Discharge Orders     None         Elson Areas, PA-C 07/29/23 1552    Rozelle Logan, DO 07/30/23 [phone]

## 2023-07-29 NOTE — ED Notes (Signed)
Room air sats of 88%. 3l brought this patient to 95%

## 2023-07-29 NOTE — ED Provider Notes (Signed)
[facility]    CSN: [id number] Arrival date & time: 07/29/23  2536      History   Chief Complaint Chief Complaint  Patient presents with   Shortness of Breath    HPI Sherri Schultz is a 57 y.o. female presents for evaluation of shortness of breath.  Patient reports 2 days of worsening shortness of breath at rest as well as exertion.  She is currently following with pulmonology for this.  CT of chest recently showed mediastinal adenopathy, bilateral patchy infiltrates.  She is currently undergoing workup for inflammatory causes including sarcoidosis versus malignancy.  She is scheduled for bronchoscopy on September 16.  She called her pulmonologist office today for her symptoms and they advised that she go to urgent care.  She denies any orthopnea, CP, cough, fevers, dizziness, syncope.  She does not check her oxygen at home.  She is not on any inhalers.  Denies any previous history of asthma or breathing issues.   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/15/2023   Excessive daytime sleepiness 04/04/2023   Migraine headache without aura 04/04/2023   Insomnia 04/04/2023   Genetic testing 01/01/2020   Malignant melanoma of right lower leg (HCC) 12/22/2019   Family history of breast cancer    Family history of melanoma    Family history of colon cancer    Family history of brain cancer    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    Closed displaced trimalleolar fracture of lower leg with routine healing 06/19/2018    Past Surgical History:  Procedure Laterality Date   BREAST BIOPSY Right 10+ yrs ago   benign   BREAST BIOPSY Right 01/21/2020   x2   BREAST BIOPSY Left 12/14/2019   x2   BREAST CYST ASPIRATION Right 03/12/2021   BREAST EXCISIONAL BIOPSY Right 02/15/2020   BREAST LUMPECTOMY Left 02/15/2020   BREAST LUMPECTOMY WITH RADIOACTIVE SEED LOCALIZATION Left 02/15/2020   Procedure: LEFT BREAST LUMPECTOMY WITH RADIOACTIVE SEED LOCALIZATION;  Surgeon: Almond Lint, MD;  Location: [facility];  Service: General;  Laterality: Left;   MELANOMA EXCISION Right 2013   knee   ORIF ANKLE FRACTURE Left 05/06/2018   Procedure: OPEN REDUCTION INTERNAL FIXATION (ORIF) LEFT TRIMALLEOLAR ANKLE FRACTURE;  Surgeon: Tarry Kos, MD;  Location: [facility];  Service: Orthopedics;  Laterality: Left;   RADIOACTIVE SEED GUIDED EXCISIONAL BREAST BIOPSY Right 02/15/2020   Procedure: RIGHT BREAST RADIOACTIVE SEED LOCALIZATION EXCISIONAL BIOPSY X 2;  Surgeon: Almond Lint, MD;  Location: [facility];  Service: General;  Laterality: Right;    OB History   No obstetric history on file.      Home Medications    Prior to Admission medications   Medication Sig Start Date End Date Taking? Authorizing Provider  baclofen (LIORESAL) 10  MG tablet Take 10 mg by mouth 3 (three) times daily.    [provider]  botulinum toxin Type A (BOTOX) 200 units injection Inject 200 Units into the muscle every 3 (three) months. 06/05/22   [provider]  levocetirizine (XYZAL) 5 MG tablet Take 5 mg by mouth every evening.    [provider]  meclizine (ANTIVERT) 25 MG tablet Take 1 tablet by mouth 3 (three) times daily as needed. 02/26/17   [provider]  omeprazole (PRILOSEC) 40 MG capsule Take 40 mg by mouth daily. 07/20/20   [provider]  predniSONE  (DELTASONE) 10 MG tablet Take 10 mg by mouth daily with breakfast.    [provider]  prochlorperazine (COMPAZINE) 10 MG tablet Take 10 mg by mouth every 6 (six) hours as needed for nausea or vomiting.    [provider]  propranolol (INDERAL) 40 MG tablet Take 40 mg by mouth daily.    [provider]  Rimegepant Sulfate (NURTEC) 75 MG TBDP Take by mouth.    [provider]  rosuvastatin (CRESTOR) 10 MG tablet Take 10 mg by mouth daily.    [provider]  SUMAtriptan (IMITREX) 25 MG tablet May repeat in 2 hours if headache persists or recurs. 09/15/20   Magrinat, Valentino Hue, MD  tamoxifen (NOLVADEX) 20 MG tablet Take 1 tablet (20 mg total) by mouth daily. 04/12/22   Rachel Moulds, MD  tiZANidine (ZANAFLEX) 4 MG capsule Take 4 mg by mouth 3 (three) times daily. 1/2 tab at night    [provider]  valACYclovir (VALTREX) 1000 MG tablet Take 2 tablets by mouth 2 (two) times daily as needed. 02/04/18   [provider]  venlafaxine XR (EFFEXOR-XR) 150 MG 24 hr capsule Take 1 capsule by mouth daily. 03/08/18   [provider]    Family History Family History  Problem Relation Age of Onset   Breast cancer Mother 61   Diabetes Mother    Colon cancer Mother 29   Breast cancer Maternal Aunt        dx. >50   Diabetes Father    Hypertension Father    Heart attack Father    Arthritis Father    Diabetes Brother    Melanoma Brother 82   Diabetes Brother    Breast cancer Cousin        dx. 40s/50s   Brain cancer Maternal Uncle        dx. 20s   Breast cancer Maternal Aunt        dx. >50   Breast cancer Maternal Aunt        dx. >50   Cancer Maternal Uncle        unknown type, dx. >50   Breast cancer Cousin        dx. 40s/50s    Social History Social History   Tobacco Use   Smoking status: Never   Smokeless tobacco: Never  Vaping Use   Vaping status: Never Used  Substance Use Topics   Alcohol use: Never   Drug use:  Never     Allergies   Patient has no known allergies.   Review of Systems Review of Systems  Respiratory:  Positive for shortness of breath.      Physical Exam Triage Vital Signs ED Triage Vitals  Encounter Vitals Group     BP 07/29/23 1007 107/72     Systolic BP Percentile --      Diastolic BP Percentile --  Pulse Rate 07/29/23 1007 77     Resp 07/29/23 1007 (!) 24     Temp 07/29/23 1007 98.5 F (36.9 C)     Temp Source 07/29/23 1007 Oral     SpO2 07/29/23 1007 (!) 86 %     Weight --      Height --      Head Circumference --      Peak Flow --      Pain Score 07/29/23 1005 7     Pain Loc --      Pain Education --      Exclude from Growth Chart --    No data found.  Updated Vital Signs BP 107/72 (BP Location: Right Arm)   Pulse 77   Temp 98.5 F (36.9 C) (Oral)   Resp (!) 24   LMP 03/27/2011   SpO2 (!) 86%   Visual Acuity Right Eye Distance:   Left Eye Distance:   Bilateral Distance:    Right Eye Near:   Left Eye Near:    Bilateral Near:     Physical Exam Vitals and nursing note reviewed.  Constitutional:      General: She is not in acute distress.    Appearance: Normal appearance. She is not ill-appearing.  HENT:     Head: Normocephalic and atraumatic.  Eyes:     Pupils: Pupils are equal, round, and reactive to light.  Cardiovascular:     Rate and Rhythm: Normal rate and regular rhythm.     Heart sounds: Normal heart sounds.  Pulmonary:     Effort: Pulmonary effort is normal. No respiratory distress.     Breath sounds: Normal breath sounds. No stridor. No rhonchi.  Skin:    General: Skin is warm and dry.     Capillary Refill: Capillary refill takes less than 2 seconds.  Neurological:     General: No focal deficit present.     Mental Status: She is alert and oriented to person, place, and time.  Psychiatric:        Mood and Affect: Mood normal.        Behavior: Behavior normal.      UC Treatments / Results  Labs (all labs  ordered are listed, but only abnormal results are displayed) Labs Reviewed - No data to display  EKG   Radiology No results found.  Procedures Procedures (including critical care time)  Medications Ordered in UC Medications - No data to display  Initial Impression / Assessment and Plan / UC Course  I have reviewed the triage vital signs and the nursing notes.  Pertinent labs & imaging results that were available during my care of the patient were reviewed by me and considered in my medical decision making (see chart for details).     Reviewed exam and symptoms with patient.  Patient presents for worsening shortness of breath x 2 days currently undergoing pulmonology workup for inflammatory/malignancy causes.  O2 on arrival was 86% on room air.  Respirations 24.  Patient was placed on 2 L nasal cannula and O2 did increase to 94%.  Discussed with patient given her hypoxia advise she go to the emergency room via EMS for further workup.  Patient declined EMS stating she will drive herself.  I strongly advised against this but she again declined EMS transfer.  Risk of going POV were discussed in length and she verbalized understanding.  Patient will go POV to [facility] emergency room.  She was instructed to pull over  and call 911 if she develops any worsening symptoms in transit and she verbalized understanding.  Attempted to call report to charge nurse line but no answer. Final Clinical Impressions(s) / UC Diagnoses   Final diagnoses:  Shortness of breath     Discharge Instructions      Patient to ER      ED Prescriptions   None    PDMP not reviewed this encounter.   Radford Pax, NP 07/29/23 1028

## 2023-07-29 NOTE — Discharge Instructions (Addendum)
Patient to ER  

## 2023-07-30 ENCOUNTER — Ambulatory Visit
Admit: 2023-07-30 | Discharge: 2023-07-30 | Disposition: A | Discharge status: Home / Self Care | Payer: BC Managed Care – PPO | Attending: Radiation Oncology | Admitting: Radiation Oncology

## 2023-07-30 ENCOUNTER — Inpatient Hospital Stay (HOSPITAL_COMMUNITY): Payer: BC Managed Care – PPO

## 2023-07-30 DIAGNOSIS — R0602 Shortness of breath: Secondary | ICD-10-CM | POA: Diagnosis not present

## 2023-07-30 DIAGNOSIS — C7951 Secondary malignant neoplasm of bone: Secondary | ICD-10-CM

## 2023-07-30 DIAGNOSIS — D0512 Intraductal carcinoma in situ of left breast: Secondary | ICD-10-CM | POA: Diagnosis not present

## 2023-07-30 DIAGNOSIS — C7802 Secondary malignant neoplasm of left lung: Secondary | ICD-10-CM

## 2023-07-30 DIAGNOSIS — C7801 Secondary malignant neoplasm of right lung: Secondary | ICD-10-CM

## 2023-07-30 LAB — CBC
HCT: 38.1 % (ref 36.0–46.0)
Hemoglobin: 12.4 g/dL (ref 12.0–15.0)
MCH: 31.2 pg (ref 26.0–34.0)
MCHC: 32.5 g/dL (ref 30.0–36.0)
MCV: 96 fL (ref 80.0–100.0)
Platelets: 352 10*3/uL (ref 150–400)
RBC: 3.97 MIL/uL (ref 3.87–5.11)
RDW: 12.5 % (ref 11.5–15.5)
WBC: 9.1 10*3/uL (ref 4.0–10.5)
nRBC: 0 % (ref 0.0–0.2)

## 2023-07-30 LAB — COMPREHENSIVE METABOLIC PANEL
ALT: 12 U/L (ref 0–44)
AST: 23 U/L (ref 15–41)
Albumin: 2.9 g/dL — ABNORMAL LOW (ref 3.5–5.0)
Alkaline Phosphatase: 79 U/L (ref 38–126)
Anion gap: 10 (ref 5–15)
BUN: 5 mg/dL — ABNORMAL LOW (ref 6–20)
CO2: 20 mmol/L — ABNORMAL LOW (ref 22–32)
Calcium: 8.7 mg/dL — ABNORMAL LOW (ref 8.9–10.3)
Chloride: 105 mmol/L (ref 98–111)
Creatinine, Ser: 0.66 mg/dL (ref 0.44–1.00)
GFR, Estimated: >60 mL/min (ref >60)
Glucose, Bld: 111 mg/dL — ABNORMAL HIGH (ref 70–99)
Potassium: 3.6 mmol/L (ref 3.5–5.1)
Sodium: 135 mmol/L (ref 135–145)
Total Bilirubin: 0.2 mg/dL — ABNORMAL LOW (ref 0.3–1.2)
Total Protein: 6.3 g/dL — ABNORMAL LOW (ref 6.5–8.1)

## 2023-07-30 LAB — CEA: CEA: 0.8 ng/mL (ref 0.0–4.7)

## 2023-07-30 LAB — CANCER ANTIGEN 19-9: CA 19-9: 12 U/mL (ref 0–35)

## 2023-07-30 LAB — HIV ANTIBODY (ROUTINE TESTING W REFLEX): HIV Screen 4th Generation wRfx: NONREACTIVE

## 2023-07-30 MED ORDER — IOHEXOL 350 MG/ML SOLN
75.0000 mL | Freq: Once | INTRAVENOUS | Status: AC | PRN
  Administered 2023-07-30: 75 mL via INTRAVENOUS

## 2023-07-30 MED ORDER — IOHEXOL 9 MG/ML PO SOLN
500.0000 mL | ORAL | Status: AC
  Administered 2023-07-30 (×2): 500 mL via ORAL

## 2023-07-30 MED ORDER — GADOBUTROL 1 MMOL/ML IV SOLN
7.0000 mL | Freq: Once | INTRAVENOUS | Status: AC | PRN
  Administered 2023-07-30: 7 mL via INTRAVENOUS

## 2023-07-30 NOTE — Progress Notes (Signed)
Radiation Oncology         [phone] ________________________________  Name: Sherri Schultz        MRN: [id number]  Date of Service: 07/30/23 DOB: 1966/05/11  CC:Elizabeth Palau, FNP  No ref. provider found     REFERRING PHYSICIAN: No ref. provider found Dr. 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    HISTORY OF PRESENT ILLNESS: Sherri Schultz is a 57 y.o. female with s past medical history significant for obstructive sleep apnea, left breast cancer, right leg malignant melanoma, migraine, hypertension and anxiety seen at the request of Dr. Tyson Babinski. She was seen by her PCP on 07/09/2023 for exertional shortness of breath, bilateral neck and shoulder/chest pain, and voice change for several weeks. CXR obtained at that visit was abnormal and she was treated for GERD and pneumonitis for her symptoms. Follow-up chest CT showed midsternal lymphadenopathy with patch bilateral infiltrates with a left pleural effusion. HSP panel, fungal, and autoimmune labs negative except positive ANA. Patient was scheduled for EBUS with Dr. Delton Coombes on 08/11/2023. Patient underwent IR left thoracentesis on 07/18/2023 which was positive for adenocarcinoma. Patient does have history of left breast cancer (DCIS dx 11/2019) s/p bilateral lumpectomies, adjuvant radiation and maintained on tamoxifen since 05/2020 followed by Dr. Al Pimple. Patient stopped taking her Tamoxifen one year ago.   Most recently, she presented to an urgent care center for shortness of breath. She was sent to the ED for further workup. In the ED, patient was hemodynamically stable except for hypoxia saturating 88% on room air.  Pulmonary was also consulted.  CT scan of the chest was done which was negative for pulmonary embolism but persistent mediastinal lymphadenopathy with a large left and small right pleural effusion progressive since previous CT scan with  diffuse interstitial and alveolar infiltrate. CT of the C-spine showed C4 compression fracture with severe height loss around 80% with 2 mm bony retropulsion highly suspicious for pathological fracture. Patient was then admitted to the hospital for further evaluation and treatment.   Dr. Delton Coombes moved up her EBUS to 08/04/2023. Medical oncology recommended further work-up with CT A/P and MRI to the T/L spine. Patient will receive PET once discharged from the hospital.   We were kindly referred to discuss possible radiation treatment.  Patient states her pain started in both shoulders then went to her neck up to her hairline.The pain is currently in her right arm and neck. She describes the pain as "excruciating" and rated it a 8-9/10. She states her pain has been well controlled in the hospital. She denies pain elsewhere down her spine or anywhere else. She denies any numbness, tingling, or weakness in her upper or lower extremities.   PREVIOUS RADIATION THERAPY: Yes   03/20/20 - 05/04/20: Dr. Mitzi Hansen treated the left breast with 50.4 Gy in 28 fractions followed by a boost of 10 Gy in 5 fractions to the lumpectomy cavity  PAST MEDICAL HISTORY:  Past Medical History:  Diagnosis Date   Anxiety    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   Cancer (HCC) 2013   right knee   Family history of brain cancer    Family history of breast cancer    Family history of colon cancer    Family history of melanoma    Hypertension    Personal history of radiation therapy    Trimalleolar fracture of ankle, closed, left, initial  encounter        PAST SURGICAL HISTORY: Past Surgical History:  Procedure Laterality Date   BREAST BIOPSY Right 10+ yrs ago   benign   BREAST BIOPSY Right 01/21/2020   x2   BREAST BIOPSY Left 12/14/2019   x2   BREAST CYST ASPIRATION Right 03/12/2021   BREAST EXCISIONAL BIOPSY Right 02/15/2020   BREAST LUMPECTOMY Left 02/15/2020   BREAST LUMPECTOMY WITH RADIOACTIVE SEED  LOCALIZATION Left 02/15/2020   Procedure: LEFT BREAST LUMPECTOMY WITH RADIOACTIVE SEED LOCALIZATION;  Surgeon: Almond Lint, MD;  Location: [facility];  Service: General;  Laterality: Left;   MELANOMA EXCISION Right 2013   knee   ORIF ANKLE FRACTURE Left 05/06/2018   Procedure: OPEN REDUCTION INTERNAL FIXATION (ORIF) LEFT TRIMALLEOLAR ANKLE FRACTURE;  Surgeon: Tarry Kos, MD;  Location: [facility];  Service: Orthopedics;  Laterality: Left;   RADIOACTIVE SEED GUIDED EXCISIONAL BREAST BIOPSY Right 02/15/2020   Procedure: RIGHT BREAST RADIOACTIVE SEED LOCALIZATION EXCISIONAL BIOPSY X 2;  Surgeon: Almond Lint, MD;  Location: [facility];  Service: General;  Laterality: Right;     FAMILY HISTORY:  Family History  Problem Relation Age of Onset   Breast cancer Mother 56   Diabetes Mother    Colon cancer Mother 56   Breast cancer Maternal Aunt        dx. >50   Diabetes Father    Hypertension Father    Heart attack Father    Arthritis Father    Diabetes Brother    Melanoma Brother 57   Diabetes Brother    Breast cancer Cousin        dx. 40s/50s   Brain cancer Maternal Uncle        dx. 20s   Breast cancer Maternal Aunt        dx. >50   Breast cancer Maternal Aunt        dx. >50   Cancer Maternal Uncle        unknown type, dx. >50   Breast cancer Cousin        dx. 40s/50s     SOCIAL HISTORY:  reports that she has never smoked. She has never used smokeless tobacco. She reports that she does not drink alcohol and does not use drugs.   ALLERGIES: Patient has no known allergies.   MEDICATIONS:  Current Facility-Administered Medications  Medication Dose Route Frequency Provider Last Rate Last Admin   acetaminophen (TYLENOL) tablet 650 mg  650 mg Oral Q6H PRN Hughie Closs, MD       Or   acetaminophen (TYLENOL) suppository 650 mg  650 mg Rectal Q6H PRN Hughie Closs, MD       albuterol (PROVENTIL) (2.5 MG/3ML) 0.083% nebulizer  solution 2.5 mg  2.5 mg Nebulization Q6H PRN Pahwani, Ravi, MD       enoxaparin (LOVENOX) injection 40 mg  40 mg Subcutaneous Q24H Pahwani, Ravi, MD   40 mg at 07/30/23 1419   morphine (PF) 2 MG/ML injection 2 mg  2 mg Intravenous Q4H PRN Hughie Closs, MD   2 mg at 07/30/23 1241   ondansetron (ZOFRAN) tablet 4 mg  4 mg Oral Q6H PRN Hughie Closs, MD       Or   ondansetron (ZOFRAN) injection 4 mg  4 mg Intravenous Q6H PRN Pahwani, Ravi, MD       pantoprazole (PROTONIX) EC tablet 40 mg  40 mg Oral Daily Hughie Closs, MD   40 mg at 07/30/23 [phone]  propranolol ER (INDERAL LA) 24 hr capsule 80 mg  80 mg Oral Daily Pahwani, Ravi, MD   80 mg at 07/30/23 0817   sodium chloride flush (NS) 0.9 % injection 3 mL  3 mL Intravenous Q12H Pahwani, Daleen Bo, MD   3 mL at 07/30/23 0818   venlafaxine XR (EFFEXOR-XR) 24 hr capsule 150 mg  150 mg Oral Daily Hughie Closs, MD   150 mg at 07/30/23 0817     REVIEW OF SYSTEMS: Notable for that above.      PHYSICAL EXAM:  Wt Readings from Last 3 Encounters:  07/30/23 162 lb 7.7 oz (73.7 kg)  07/16/23 166 lb 3.2 oz (75.4 kg)  07/09/23 169 lb 12.8 oz (77 kg)   Temp Readings from Last 3 Encounters:  07/30/23 98.1 F (36.7 C) (Oral)  07/29/23 98.5 F (36.9 C) (Oral)  04/14/23 (!) 97.3 F (36.3 C) (Temporal)   BP Readings from Last 3 Encounters:  07/30/23 117/64  07/29/23 107/72  07/16/23 124/82   Pulse Readings from Last 3 Encounters:  07/30/23 74  07/29/23 77  07/16/23 72   Pain Assessment Pain Score: 3 /10  Deferred due to nature of telephone visit.     ECOG = 4  0 - Asymptomatic (Fully active, able to carry on all predisease activities without restriction)  1 - Symptomatic but completely ambulatory (Restricted in physically strenuous activity but ambulatory and able to carry out work of a light or sedentary nature. For example, light housework, office work)  2 - Symptomatic, <50% in bed during the day (Ambulatory and capable of all self care  but unable to carry out any work activities. Up and about more than 50% of waking hours)  3 - Symptomatic, >50% in bed, but not bedbound (Capable of only limited self-care, confined to bed or chair 50% or more of waking hours)  4 - Bedbound (Completely disabled. Cannot carry on any self-care. Totally confined to bed or chair)  5 - Death   Santiago Glad MM, Creech RH, Tormey DC, et al. [phone]). "Toxicity and response criteria of the [facility]". Am. Evlyn Clines. Oncol. 5 (6): 649-55    LABORATORY DATA:  Lab Results  Component Value Date   WBC 9.1 07/30/2023   HGB 12.4 07/30/2023   HCT 38.1 07/30/2023   MCV 96.0 07/30/2023   PLT 352 07/30/2023   Lab Results  Component Value Date   NA 135 07/30/2023   K 3.6 07/30/2023   CL 105 07/30/2023   CO2 20 (L) 07/30/2023   Lab Results  Component Value Date   ALT 12 07/30/2023   AST 23 07/30/2023   ALKPHOS 79 07/30/2023   BILITOT 0.2 (L) 07/30/2023      RADIOGRAPHY: MR CERVICAL SPINE W WO CONTRAST  Result Date: 07/30/2023 CLINICAL DATA:  Abnormal CT. EXAM: MRI CERVICAL SPINE WITHOUT AND WITH CONTRAST TECHNIQUE: Multiplanar and multiecho pulse sequences of the cervical spine, to include the craniocervical junction and cervicothoracic junction, were obtained without and with intravenous contrast. CONTRAST:  7mL GADAVIST GADOBUTROL 1 MMOL/ML IV SOLN COMPARISON:  Cervical spine CT from yesterday FINDINGS: Alignment: Physiologic. Vertebrae: Infiltrating bone lesions seen at C2, C4, C5, C6, C7, T1, T2, and T3 vertebral bodies. Posterior element involvement at C4 and leftward at C5, C6. Known C4 pathologic fracture with advanced height loss. No extraosseous tumor infiltration or retropulsion. Cord: Normal signal and morphology Posterior Fossa, vertebral arteries, paraspinal tissues: No perispinal hematoma or spinal canal collection. Sizable left pleural effusion. Disc  levels: No significant degenerative change. IMPRESSION: Numerous bone  lesions compatible with metastatic disease. Known pathologic compression fracture at C4 with advanced height loss but no retropulsion or soft tissue tumor extension. Electronically Signed   By: Tiburcio Pea M.D.   On: 07/30/2023 06:11   CT CERVICAL SPINE W CONTRAST  Addendum Date: 07/29/2023   ADDENDUM REPORT: 07/29/2023 19:01 ADDENDUM: Findings discussed with Dr. Kandace Blitz via telephone at 6:58 p.m. Electronically Signed   By: Feliberto Harts M.D.   On: 07/29/2023 19:01   Result Date: 07/29/2023 CLINICAL DATA:  Metastatic disease evaluation EXAM: CT CERVICAL SPINE WITH CONTRAST TECHNIQUE: Multidetector CT imaging of the cervical spine was performed during intravenous contrast administration. Multiplanar CT image reconstructions were also generated. RADIATION DOSE REDUCTION: This exam was performed according to the departmental dose-optimization program which includes automated exposure control, adjustment of the mA and/or kV according to patient size and/or use of iterative reconstruction technique. CONTRAST:  OMNIPAQUE IOHEXOL 350 MG/ML SOLN COMPARISON:  None Available. FINDINGS: Motion limited study. Alignment: Bony retropulsion at C4 is detailed below. Otherwise, no substantial sagittal subluxation. Skull base and vertebrae: C4 compression fracture with severe (approximately 80%) height loss and approximately 2 mm of bony retropulsion. Irregular lucency and sclerosis associated with vertebral body. Soft tissues and spinal canal: No prevertebral fluid or swelling. No visible canal hematoma. Disc levels: Mild-to-moderate multilevel degenerative change. The above retropulsion likely result in mild canal stenosis at C4. Upper chest: Please see same day CT of the chest for further evaluation. IMPRESSION: C4 compression fracture with severe (approximately 80%) height loss, 2 mm of bony retropulsion, and heterogeneous sclerosis/lucency. Findings are highly suspicious for a pathologic fracture. MRI of the  cervical spine with contrast could further assess this level as well as better assess for additional lesions if clinically warranted. Electronically Signed: By: Feliberto Harts M.D. On: 07/29/2023 18:30   CT Angio Chest PE W and/or Wo Contrast  Result Date: 07/29/2023 CLINICAL DATA:  Pulmonary embolus suspected with high probability. Shortness of breath. Bronchoscopy 2 weeks ago. EXAM: CT ANGIOGRAPHY CHEST WITH CONTRAST TECHNIQUE: Multidetector CT imaging of the chest was performed using the standard protocol during bolus administration of intravenous contrast. Multiplanar CT image reconstructions and MIPs were obtained to evaluate the vascular anatomy. RADIATION DOSE REDUCTION: This exam was performed according to the departmental dose-optimization program which includes automated exposure control, adjustment of the mA and/or kV according to patient size and/or use of iterative reconstruction technique. CONTRAST:  OMNIPAQUE IOHEXOL 350 MG/ML SOLN COMPARISON:  Chest radiograph 07/29/2023.  CT chest 07/11/2023 FINDINGS: Cardiovascular: There is good opacification of the central and segmental pulmonary arteries. No focal filling defects. No evidence of significant pulmonary embolus. Normal heart size. No pericardial effusions. Normal caliber thoracic aorta. No aortic dissection. Mediastinum/Nodes: Thyroid gland is unremarkable. Esophagus is decompressed. Mediastinal lymphadenopathy. Largest right paratracheal nodes measure 2.2 cm diameter. Similar appearance to prior study. This may indicate metastatic disease. Lungs/Pleura: Large left and small right pleural effusions, progressing since the prior CT study. Diffuse interstitial and patchy alveolar infiltrates throughout the lungs, also progressing. Changes are most likely to represent edema or multifocal pneumonia. Aspiration could also have this appearance. Underlying primary or metastatic disease could also be considered. Upper Abdomen: Diffuse fatty  infiltration of the liver. No focal lesions. Musculoskeletal: No chest wall abnormality. No acute or significant osseous findings. Review of the MIP images confirms the above findings. IMPRESSION: 1. No evidence of significant pulmonary embolus. 2. Persistent finding of mediastinal lymphadenopathy,  unchanged. 3. Large left and small right pleural effusions are progressing since prior CT. 4. Diffuse interstitial and alveolar infiltrates throughout both lungs is also progressing. Changes may represent edema or multifocal pneumonia. Aspiration or underlying neoplasm could also be considered. Electronically Signed   By: Burman Nieves M.D.   On: 07/29/2023 18:40   DG Chest 2 View  Result Date: 07/29/2023 CLINICAL DATA:  Wheezing and shortness of breath. Recent left-sided thoracentesis with evidence of malignant effusion. History of breast cancer. EXAM: CHEST - 2 VIEW COMPARISON:  Chest x-ray dated July 21, 2023. FINDINGS: The heart size and mediastinal contours are within normal limits. Fairly diffuse interstitial thickening is similar, with increasing irregular airspace opacities in the left lung. No pneumothorax or pleural effusion. No acute osseous abnormality. Similar exaggerated thoracic kyphosis. IMPRESSION: 1. 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 Superimposed pneumonia in the left lung is possible. Electronically Signed   By: Obie Dredge M.D.   On: 07/29/2023 14:01   DG Chest 2 View  Result Date: 07/27/2023 CLINICAL DATA:  Left-sided pleural effusion. EXAM: CHEST - 2 VIEW COMPARISON:  X-ray 07/18/2023.  CT 07/11/2023 FINDINGS: Underinflation. Diffuse interstitial changes, thickening, are becoming more prominent. No pneumothorax or effusion. Normal cardiopericardial silhouette. There is some increasing bandlike opacities in the midlung  zones bilaterally as well as some nodularity towards the lingula. IMPRESSION: Increasing opacity seen in both lungs with a additional increased thickening of the interstitial changes. Please correlate with the history. Electronically Signed   By: Karen Kays M.D.   On: 07/27/2023 11:21   US THORACENTESIS ASP PLEURAL SPACE W/IMG GUIDE  Result Date: 07/18/2023 INDICATION: Patient with history of left breast cancer, dyspnea, left pleural effusion. Request received for diagnostic and therapeutic left thoracentesis. EXAM: ULTRASOUND GUIDED DIAGNOSTIC AND THERAPEUTIC LEFT THORACENTESIS MEDICATIONS: 8 mL 1% lidocaine COMPLICATIONS: None immediate. PROCEDURE: An ultrasound guided thoracentesis was thoroughly discussed with the patient and questions answered. The benefits, risks, alternatives and complications were also discussed. The patient understands and wishes to proceed with the procedure. Written consent was obtained. Ultrasound was performed to localize and mark an adequate pocket of fluid in the left chest. The area was then prepped and draped in the normal sterile fashion. 1% Lidocaine was used for local anesthesia. Under ultrasound guidance a 6 Fr Safe-T-Centesis catheter was introduced. Thoracentesis was performed. The catheter was removed and a dressing applied. FINDINGS: A total of approximately 600 cc of yellow fluid was removed. Samples were sent to the laboratory as requested by the clinical team. IMPRESSION: Successful ultrasound guided diagnostic and therapeutic left thoracentesis yielding 600 cc of pleural fluid. Performed by: Jeananne Rama, PA-C Electronically Signed   By: Irish Lack M.D.   On: 07/18/2023 15:19   DG Chest 1 View  Result Date: 07/18/2023 CLINICAL DATA:  S/P thoracentesis.  Shortness of breath. EXAM: CHEST  1 VIEW COMPARISON:  Chest x-ray 07/09/2023. FINDINGS: Small left pleural effusion. No visible pneumothorax. Similar patchy base. Similar cardiomediastinal silhouette.  Elevated left hemidiaphragm. IMPRESSION: 1. Small left pleural effusion.  No visible pneumothorax. 2. Similar patchy left lung opacities. Electronically Signed   By: Feliberto Harts M.D.   On: 07/18/2023 11:47   CT Super D Chest Wo Contrast  Result Date: 07/11/2023 CLINICAL DATA:  Wheezing with shortness of breath for 1 month. Nonsmoker. EXAM: CT CHEST WITHOUT CONTRAST TECHNIQUE: Multidetector CT imaging of the chest was performed using thin  slice collimation for electromagnetic bronchoscopy planning purposes, without intravenous contrast. RADIATION DOSE REDUCTION: This exam was performed according to the departmental dose-optimization program which includes automated exposure control, adjustment of the mA and/or kV according to patient size and/or use of iterative reconstruction technique. COMPARISON:  Chest radiographs 07/09/2023. No other comparison studies. FINDINGS: Cardiovascular: No acute vascular findings are identified on noncontrast imaging. There is no evidence of significant atherosclerosis. The heart size is normal. There is no pericardial effusion. Mediastinum/Nodes: There are multiple enlarged mediastinal and hilar lymph nodes bilaterally, including a right paratracheal node measuring 1.8 cm short axis on image 36/2 and a subcarinal node measuring 1.7 cm on image 54/2. These lymph nodes are noncalcified. Possible mildly enlarged left internal mammary node measuring 8 mm on image 45/2. No enlarged axillary lymph nodes. The thyroid gland, trachea and esophagus demonstrate no significant findings. Lungs/Pleura: Small to moderate dependent left pleural effusion. No significant right pleural effusion. No pneumothorax. As seen on recent radiographs, there is diffuse central airway thickening with basilar predominant ill-defined peribronchovascular ground-glass nodularity in both lungs. There is a wedge-shaped area of confluence inferiorly in the lingula. There is compressive atelectasis in the left  lower lobe. No dominant lung mass or other areas of consolidation identified. No honeycomb formation or significant traction bronchiectasis. Upper abdomen: No significant findings are seen in the visualized upper abdomen. There are no enlarged lymph nodes. Musculoskeletal/Chest wall: There is no chest wall mass or suspicious osseous finding. Heterogeneous sclerosis within the T9 vertebral body is nonspecific, although suggestive of a hemangioma. No aggressive osseous lesions are identified. Surgical clips are noted medially in the left breast. Unless specific follow-up recommendations are mentioned in the findings or impression sections, no imaging follow-up of any mentioned incidental findings is recommended. IMPRESSION: 1. Imaging for bronchoscopy planning and guidance. 2. Diffuse central airway thickening with basilar predominant ill-defined peribronchovascular ground-glass nodularity in both lungs. There is a wedge-shaped area of confluence inferiorly in the lingula and a small to moderate left pleural effusion. These findings are nonspecific and could be secondary to atypical infection or an inflammatory process (such as sarcoidosis, collagen vascular disease and organizing pneumonia). Patient has a history of breast cancer, and metastatic disease is not completely excluded, although not favored. 3. Nonspecific mediastinal and hilar adenopathy. This could be reactive, although lymphoproliferative process/metastatic disease cannot be excluded. 4. Heterogeneous sclerosis within the T9 vertebral body is nonspecific, although suggestive of a hemangioma. No aggressive osseous lesions identified. Electronically Signed   By: Carey Bullocks M.D.   On: 07/11/2023 16:43   DG Chest 2 View  Result Date: 07/09/2023 CLINICAL DATA:  Shortness of breath EXAM: CHEST - 2 VIEW COMPARISON:  None Available. FINDINGS: Normal cardiac and mediastinal contours. Patchy consolidative opacities throughout the left upper, mid and  lower lung. Patchy opacities within the right mid lung. Small left pleural effusion. No definite pneumothorax. Thoracic spine degenerative changes. Surgical clips left breast. IMPRESSION: Patchy consolidative opacities throughout the left mid and lower lung and right mid lung concerning for infection. Small left pleural effusion. Underlying mass or malignant process not excluded. Given the complexity of findings, recommend further evaluation with chest CT. These results will be called to the ordering clinician or representative by the Radiologist Assistant, and communication documented in the PACS or [employer]. Electronically Signed   By: Annia Belt M.D.   On: 07/09/2023 13:15     We personally reviewed her most up to date imaging.   IMPRESSION/PLAN: 1. Metastasis to the  C-spine from unknown primary.   It was a pleasure meeting this patient today. Recent imaging indicates numerous bone lesions compatible with metastatic disease with a known compression fracture at C4. Patient is experiencing significant pain from these areas of disease. Dr. Mitzi Hansen recommends further imaging of the spine to rule out other areas of metastases within the spine prior to treatment. She is a good candidate for palliative radiation to the c-spine to alleviate her symptoms and reduce her risk of disease progression.   Today we discussed the workup and natural course of osseous metastases, highlighting the role of radiotherapy in the management. We discussed the available radiation techniques, and focused on the details and logistics of delivery. We discussed and outlined the risks, benefits, short and long-term effects associated with radiotherapy. She was encouraged to ask questions that were answered to her stated satisfaction. She expressed understanding of the treatment, which is of palliative intent, and would like to proceed with treatment.  She is scheduled to complete her MRI of the T/L spine tonight at 10 pm. She  is scheduled for CT simulation on 07/31/2023. We will try and treat her BID on Friday in order to get 2 treatments in prior to the weekend. Anticipate a total of 30 Gy in 10 fractions.  In a visit lasting 60 minutes, greater than 50% of the time was spent face to face discussing the patient's condition, in preparation for the discussion, and coordinating the patient's care.   This encounter was provided by telephone.  The patient has provided two factor identification and has given verbal consent for this type of encounter and has been advised to only accept a meeting of this type in a secure network environment. The time spent during this encounter was 60 minutes including preparation, discussion, and coordination of the patient's care. The attendants for this meeting include Joyice Faster, [facility] and [employer].  During the encounter, Joyice Faster, [facility] were located at [facility] Radiation Oncology Department.  Saffiyah Bracker was located at [employer].  The above documentation reflects my direct findings during this shared patient visit. Please see the separate note by Dr. Mitzi Hansen on this date for the remainder of the patient's plan of care.    Joyice Faster, PA-C   **Disclaimer: This note was dictated with voice recognition software. Similar sounding words can inadvertently be transcribed and this note may contain transcription errors which may not have been corrected upon publication of note.**

## 2023-07-30 NOTE — Progress Notes (Signed)
Called MRI and asked them about pt having a follow up/completion of her MRI tonight at 10 pm as stated in the progress notes. They stated that the earliest the pt could come down for the follow up MRI would be 0500 on 07/31/23 due to the contrast given and it needing at least 24 hours. So the patient will be on 6N through the night.

## 2023-07-30 NOTE — Hospital Course (Signed)
Sherri Schultz is a 57 y.o. female with past medical history of obstructive sleep apnea, left breast cancer, right leg malignant melanoma, migraine, hypertension and anxiety was sent from urgent care with hypoxia.  Of note patient was recently seen at the primary care office on 07/09/2023 with exertional shortness of breath and bilateral neck and shoulder/chest pain with voice change for several weeks.  Chest x-ray was abnormal and was treated for GERD pneumonitis.  Follow-up chest CT showed midsternal lymphadenopathy with patchy bilateral infiltrates with left pleural effusion. HSP panel, fungal, and autoimmune labs negative except positive ANA.  Patient had planned EBUS for 08/11/2023 with Dr. Delton Coombes.  Underwent IR left thoracentesis on 07/18/23 which was positive for adenocarcinoma.  Patient does have history of left breast cancer (DCIS dx 11/2019) s/p bilateral lumpectomies, adjuvant radiation and maintained on tamoxifen since 05/2020 followed by Dr. Al Pimple.  Patient had stopped taking her tamoxifen one year ago.  Patient had initially  called PCCM office, she was sent to urgent care center, she was found to be hypoxic so she was sent to the ED for further workup.  In the ED patient was hemodynamically stable except for hypoxia saturating 88% on room air.  Pulmonary was also consulted.  CT scan of the chest was done which was negative for pulmonary embolism but persistent mediastinal lymphadenopathy with a large left and small right pleural effusion progressive since previous CT scan with diffuse interstitial and alveolar infiltrate..  CT of the C-spine showed C4 compression fracture with severe height loss around 80% with 2 mm bony retropulsion highly suspicious for pathological fracture.  Patient was then admitted hospital for further evaluation and treatment.  Assessment and plan.  Acute hypoxic respiratory failure likely secondary to pulmonary infiltrates with adenocarcinoma of unknown primary:   Results from  recent pleural fluid on 07/18/2023 shows adenocarcinoma.  CT pulmonary embolism protocol without any PE.    COVID and respiratory viral panel negative.  CEA 0.8 within normal range.  CA 19-9  was12 and within normal range.  BNP low at 22.  Currently on 2 L of oxygen by nasal cannula.  Pulmonary following.   Bilateral shoulder and neck pain:  CT C-spine with C4 compression fracture with 80% height loss.  MRI of the C-spine showed numerous bony lesions compatible with metastatic disease with pathologic compression fracture of C4 with advanced height loss but no retropulsion or soft tissue tumor extension.  Will likely need radiation oncology/neurosurgery evaluation.   Hypokalemia: Potassium 3.4.  Been replenished.  Check levels in AM.   Anxiety disorder: Continue venlafaxine.   GERD: Continue PPI   History of DCIS of left breast: Patient was on tamoxifen which she stopped a year ago.  Patient used to follow Dr. Al Pimple.   Obstructive sleep apnea: Continue nocturnal CPAP

## 2023-07-30 NOTE — Consult Note (Signed)
Reason for Consult:cervical spine mets Referring Physician: hospitalists  Mariaceleste Schwark is an 57 y.o. female.   HPI:  57 year old female presented to the hospital yesterday with shortness of breath. She reports having some neck pain for the last 2 months. Has a history of breast cancer and melanoma. She endorses pain in her right arm to her elbow. Rates her pain today a 7/10.   Past Medical History:  Diagnosis Date   Anxiety    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   Cancer (HCC) 2013   right knee   Family history of brain cancer    Family history of breast cancer    Family history of colon cancer    Family history of melanoma    Hypertension    Personal history of radiation therapy    Trimalleolar fracture of ankle, closed, left, initial encounter     Past Surgical History:  Procedure Laterality Date   BREAST BIOPSY Right 10+ yrs ago   benign   BREAST BIOPSY Right 01/21/2020   x2   BREAST BIOPSY Left 12/14/2019   x2   BREAST CYST ASPIRATION Right 03/12/2021   BREAST EXCISIONAL BIOPSY Right 02/15/2020   BREAST LUMPECTOMY Left 02/15/2020   BREAST LUMPECTOMY WITH RADIOACTIVE SEED LOCALIZATION Left 02/15/2020   Procedure: LEFT BREAST LUMPECTOMY WITH RADIOACTIVE SEED LOCALIZATION;  Surgeon: Almond Lint, MD;  Location: [facility];  Service: General;  Laterality: Left;   MELANOMA EXCISION Right 2013   knee   ORIF ANKLE FRACTURE Left 05/06/2018   Procedure: OPEN REDUCTION INTERNAL FIXATION (ORIF) LEFT TRIMALLEOLAR ANKLE FRACTURE;  Surgeon: Tarry Kos, MD;  Location: [facility];  Service: Orthopedics;  Laterality: Left;   RADIOACTIVE SEED GUIDED EXCISIONAL BREAST BIOPSY Right 02/15/2020   Procedure: RIGHT BREAST RADIOACTIVE SEED LOCALIZATION EXCISIONAL BIOPSY X 2;  Surgeon: Almond Lint, MD;  Location: [facility];  Service: General;  Laterality: Right;    No Known Allergies  Social History   Tobacco Use   Smoking status:  Never   Smokeless tobacco: Never  Substance Use Topics   Alcohol use: Never    Family History  Problem Relation Age of Onset   Breast cancer Mother 31   Diabetes Mother    Colon cancer Mother 52   Breast cancer Maternal Aunt        dx. >50   Diabetes Father    Hypertension Father    Heart attack Father    Arthritis Father    Diabetes Brother    Melanoma Brother 68   Diabetes Brother    Breast cancer Cousin        dx. 40s/50s   Brain cancer Maternal Uncle        dx. 20s   Breast cancer Maternal Aunt        dx. >50   Breast cancer Maternal Aunt        dx. >50   Cancer Maternal Uncle        unknown type, dx. >50   Breast cancer Cousin        dx. 40s/50s     Review of Systems  Positive ROS: as above  All other systems have been reviewed and were otherwise negative with the exception of those mentioned in the HPI and as above.  Objective: Vital signs in last 24 hours: Temp:  [98.2 F (36.8 C)-98.6 F (37 C)] 98.2 F (36.8 C) (09/04 0744) Pulse Rate:  [65-101] 77 (09/04  2536) Resp:  [12-20] 17 (09/04 0744) BP: (104-148)/(61-84) 116/66 (09/04 0744) SpO2:  [90 %-96 %] 90 % (09/04 0744) Weight:  [73.7 kg-74.8 kg] 73.7 kg (09/04 0352)  General Appearance: Alert, cooperative, no distress, appears stated age Head: Normocephalic, without obvious abnormality, atraumatic Eyes: PERRL, conjunctiva/corneas clear, EOM's intact, fundi benign, both eyes      Neck: Supple, symmetrical, trachea midline, no adenopathy; thyroid: No enlargement/tenderness/nodules; no carotid bruit or JVD Back: Symmetric, no curvature, ROM normal, no CVA tenderness Lungs:  respirations unlabored Heart: Regular rate and rhythm, Extremities: Extremities normal, atraumatic, no cyanosis or edema Pulses: 2+ and symmetric all extremities Skin: Skin color, texture, turgor normal, no rashes or lesions  NEUROLOGIC:   Mental status: A&O x4, no aphasia, good attention span, Memory and fund of  knowledge Motor Exam - grossly normal, normal tone and bulk Sensory Exam - grossly normal Reflexes: symmetric, no pathologic reflexes, No Hoffman's, No clonus Coordination - grossly normal Gait - not tested Balance - not tested Cranial Nerves: I: smell Not tested  II: visual acuity  OS: na    OD: na  II: visual fields Full to confrontation  II: pupils Equal, round, reactive to light  III,VII: ptosis None  III,IV,VI: extraocular muscles  Full ROM  V: mastication   V: facial light touch sensation    V,VII: corneal reflex    VII: facial muscle function - upper    VII: facial muscle function - lower   VIII: hearing   IX: soft palate elevation    IX,X: gag reflex   XI: trapezius strength    XI: sternocleidomastoid strength   XI: neck flexion strength    XII: tongue strength      Data Review Lab Results  Component Value Date   WBC 9.1 07/30/2023   HGB 12.4 07/30/2023   HCT 38.1 07/30/2023   MCV 96.0 07/30/2023   PLT 352 07/30/2023   Lab Results  Component Value Date   NA 135 07/30/2023   K 3.6 07/30/2023   CL 105 07/30/2023   CO2 20 (L) 07/30/2023   BUN 5 (L) 07/30/2023   CREATININE 0.66 07/30/2023   GLUCOSE 111 (H) 07/30/2023   No results found for: "INR", "PROTIME"  Radiology: MR CERVICAL SPINE W WO CONTRAST  Result Date: 07/30/2023 CLINICAL DATA:  Abnormal CT. EXAM: MRI CERVICAL SPINE WITHOUT AND WITH CONTRAST TECHNIQUE: Multiplanar and multiecho pulse sequences of the cervical spine, to include the craniocervical junction and cervicothoracic junction, were obtained without and with intravenous contrast. CONTRAST:  7mL GADAVIST GADOBUTROL 1 MMOL/ML IV SOLN COMPARISON:  Cervical spine CT from yesterday FINDINGS: Alignment: Physiologic. Vertebrae: Infiltrating bone lesions seen at C2, C4, C5, C6, C7, T1, T2, and T3 vertebral bodies. Posterior element involvement at C4 and leftward at C5, C6. Known C4 pathologic fracture with advanced height loss. No extraosseous tumor  infiltration or retropulsion. Cord: Normal signal and morphology Posterior Fossa, vertebral arteries, paraspinal tissues: No perispinal hematoma or spinal canal collection. Sizable left pleural effusion. Disc levels: No significant degenerative change. IMPRESSION: Numerous bone lesions compatible with metastatic disease. Known pathologic compression fracture at C4 with advanced height loss but no retropulsion or soft tissue tumor extension. Electronically Signed   By: Tiburcio Pea M.D.   On: 07/30/2023 06:11   CT CERVICAL SPINE W CONTRAST  Addendum Date: 07/29/2023   ADDENDUM REPORT: 07/29/2023 19:01 ADDENDUM: Findings discussed with Dr. Kandace Blitz via telephone at 6:58 p.m. Electronically Signed   By: Feliberto Harts M.D.   On:  07/29/2023 19:01   Result Date: 07/29/2023 CLINICAL DATA:  Metastatic disease evaluation EXAM: CT CERVICAL SPINE WITH CONTRAST TECHNIQUE: Multidetector CT imaging of the cervical spine was performed during intravenous contrast administration. Multiplanar CT image reconstructions were also generated. RADIATION DOSE REDUCTION: This exam was performed according to the departmental dose-optimization program which includes automated exposure control, adjustment of the mA and/or kV according to patient size and/or use of iterative reconstruction technique. CONTRAST:  OMNIPAQUE IOHEXOL 350 MG/ML SOLN COMPARISON:  None Available. FINDINGS: Motion limited study. Alignment: Bony retropulsion at C4 is detailed below. Otherwise, no substantial sagittal subluxation. Skull base and vertebrae: C4 compression fracture with severe (approximately 80%) height loss and approximately 2 mm of bony retropulsion. Irregular lucency and sclerosis associated with vertebral body. Soft tissues and spinal canal: No prevertebral fluid or swelling. No visible canal hematoma. Disc levels: Mild-to-moderate multilevel degenerative change. The above retropulsion likely result in mild canal stenosis at C4. Upper  chest: Please see same day CT of the chest for further evaluation. IMPRESSION: C4 compression fracture with severe (approximately 80%) height loss, 2 mm of bony retropulsion, and heterogeneous sclerosis/lucency. Findings are highly suspicious for a pathologic fracture. MRI of the cervical spine with contrast could further assess this level as well as better assess for additional lesions if clinically warranted. Electronically Signed: By: Feliberto Harts M.D. On: 07/29/2023 18:30   CT Angio Chest PE W and/or Wo Contrast  Result Date: 07/29/2023 CLINICAL DATA:  Pulmonary embolus suspected with high probability. Shortness of breath. Bronchoscopy 2 weeks ago. EXAM: CT ANGIOGRAPHY CHEST WITH CONTRAST TECHNIQUE: Multidetector CT imaging of the chest was performed using the standard protocol during bolus administration of intravenous contrast. Multiplanar CT image reconstructions and MIPs were obtained to evaluate the vascular anatomy. RADIATION DOSE REDUCTION: This exam was performed according to the departmental dose-optimization program which includes automated exposure control, adjustment of the mA and/or kV according to patient size and/or use of iterative reconstruction technique. CONTRAST:  OMNIPAQUE IOHEXOL 350 MG/ML SOLN COMPARISON:  Chest radiograph 07/29/2023.  CT chest 07/11/2023 FINDINGS: Cardiovascular: There is good opacification of the central and segmental pulmonary arteries. No focal filling defects. No evidence of significant pulmonary embolus. Normal heart size. No pericardial effusions. Normal caliber thoracic aorta. No aortic dissection. Mediastinum/Nodes: Thyroid gland is unremarkable. Esophagus is decompressed. Mediastinal lymphadenopathy. Largest right paratracheal nodes measure 2.2 cm diameter. Similar appearance to prior study. This may indicate metastatic disease. Lungs/Pleura: Large left and small right pleural effusions, progressing since the prior CT study. Diffuse interstitial and  patchy alveolar infiltrates throughout the lungs, also progressing. Changes are most likely to represent edema or multifocal pneumonia. Aspiration could also have this appearance. Underlying primary or metastatic disease could also be considered. Upper Abdomen: Diffuse fatty infiltration of the liver. No focal lesions. Musculoskeletal: No chest wall abnormality. No acute or significant osseous findings. Review of the MIP images confirms the above findings. IMPRESSION: 1. No evidence of significant pulmonary embolus. 2. Persistent finding of mediastinal lymphadenopathy, unchanged. 3. Large left and small right pleural effusions are progressing since prior CT. 4. Diffuse interstitial and alveolar infiltrates throughout both lungs is also progressing. Changes may represent edema or multifocal pneumonia. Aspiration or underlying neoplasm could also be considered. Electronically Signed   By: Burman Nieves M.D.   On: 07/29/2023 18:40   DG Chest 2 View  Result Date: 07/29/2023 CLINICAL DATA:  Wheezing and shortness of breath. Recent left-sided thoracentesis with evidence of malignant effusion. History of breast cancer. EXAM:  CHEST - 2 VIEW COMPARISON:  Chest x-ray dated July 21, 2023. FINDINGS: The heart size and mediastinal contours are within normal limits. Fairly diffuse interstitial thickening is similar, with increasing irregular airspace opacities in the left lung. No pneumothorax or pleural effusion. No acute osseous abnormality. Similar exaggerated thoracic kyphosis. IMPRESSION: 1. 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 Superimposed pneumonia in the left lung is possible. Electronically Signed   By: Obie Dredge M.D.   On: 07/29/2023 14:01     Assessment/Plan: 57 year old female with history of melanoma and breast cancer. MRI C spine shows  metastatic lesions at C2, C4-C7, and T1-T3 involving the vertebral bodies. She does have a pathologic fracture of C4. Do not recommend any neurosurgical intervention at this point. Would recommend c collar when out of bed. She will need an MRI of her thoracic and lumbar spine as well. This will be treated with chemo and radiation. Recommend patient be placed on tumor board for further planning.   Tiana Loft [place]ende [place] 07/30/2023 11:49 AM

## 2023-07-30 NOTE — Consult Note (Signed)
NAME:  Sherri Schultz, MRN:  [id number], DOB:  09-20-1966, LOS: 1 ADMISSION DATE:  07/29/2023, CONSULTATION DATE:  07/29/23 REFERRING MD:  Trisha Mangle, PA, CHIEF COMPLAINT:  SOB/ hypoxia  History of Present Illness:   84 yoF with PMH of never smoker, OSA (CPAP ordered), left breast CA, migraines, melanoma (right leg), HTN, and anxiety who was sent from UC after being found hypoxic in the mid 80's on room with progressive SOB.  Also reports ongoing bilateral shoulder and neck pain with new radiation into right arm down to elbow without weakness or numbness since yesterday.  States its hard to lift her head off the bed at times.    Seen by Dr. Delton Coombes in our office 07/09/2023 after several weeks of exertional SOB with bilateral shoulder, neck and intermittent chest discomfort and voice change.  Pulmonary workup initiated, found to have abnormal CXR, empirically treated for possible GERD/ pneumonitis.  Follow-up chest CT which showed mediastinal adenopathy with patchy bilateral infiltrates with left pleural effusion.  HSP panel, fungal, and autoimmune labs negative except positive ANA.  Planned EBUS for 08/11/2023 with Dr. Delton Coombes.  Underwent IR left thoracentesis on 8/23 yielding 600 ml of yellow fluid; pleural labs consistent with lymphocytic exudate by Light's criteria, however cytology resulted on 8/30 positive for adenocarcinoma.    Of note, history of left breast cancer (DCIS dx 11/2019) s/p bilateral lumpectomies, adjuvant radiation and maintained on tamoxifen since 05/2020 followed by Dr. Al Pimple.  Patient reports she stopped taking her tamoxifen one year ago; oncology notes do not reflect this. Denies any recent fever, chills, productive cough/ hemoptysis, falls, LE edema/ pain/ swelling.  Has chronic low back pain.   Afebrile, normotensive and requiring 3L [place] without respiratory distress in ER.  Labs pending.  CXR showing progressive bilateral infiltrates, more extensive in left.  Pulmonary consulted for  further recommendations, likely to be admitted to [facility].   Pertinent  Medical History  Never smoker, OSA, left breast CA, migraines, melanoma (right leg), HTN, anxiety  Significant Hospital Events: Including procedures, antibiotic start and stop dates in addition to other pertinent events   Left pleural fluid 8/23 >> CK7 positive adenocarcinoma (unclear lineage) CT-PA 9/3 >> no pulmonary embolism, persistent stable mediastinal adenopathy right paratracheal region, left hilar region.  Large left and small right pleural effusions (increased).  Progressive bilateral scattered groundglass/nodular infiltrates. CT cervical spine 9/3 >> bony retropulsion at C4 associated with severe compression fracture and vertebral body lucency concerning for pathologic fracture MRI C-spine 9/4 >> infiltrating bony lesions noted at C2, C4, C5, C6, C7, T1, T2, T3 bodies.  The posterior element at C4 involved with associated fracture.  No extraosseous tumor infiltration or retropulsion noted.  Interim History / Subjective:   Objective   Blood pressure 116/66, pulse 77, temperature 98.2 F (36.8 C), temperature source Oral, resp. rate 17, height 5' 4.5" (1.638 m), weight 73.7 kg, last menstrual period 03/27/2011, SpO2 90%.        Intake/Output Summary (Last 24 hours) at 07/30/2023 0856 Last data filed at 07/29/2023 1700 Gross per 24 hour  Intake 240 ml  Output --  Net 240 ml   Filed Weights   07/29/23 1244 07/30/23 0352  Weight: 74.8 kg 73.7 kg   Examination: General: Pleasant woman, awake, alert, interacting appropriately HEENT: Oropharynx clear, mucous membranes moist, pupils equal Neuro: Awake, alert, good strength bilaterally, moves upper extremities and has sensation intact PULM: Clear bilaterally but significantly decreased at left base Extremities: No edema, no deformity Skin:  No rash  Resolved Hospital Problem list    Assessment & Plan:   Acute hypoxic respiratory failure  Extensive bilateral  airspace opacities consistent with malignancy given her recent positive cytology for adenocarcinoma in left pleural effusion s/p IR thora 8/23. Recurrent left and small right pleural effusions -No evidence of pulmonary embolism -She may benefit from a repeat therapeutic left thoracentesis.  She would be willing to do this.  Could also consider Pleurx given the rapid reaccumulation of her pleural fluid -Deferring antibiotics for now, low threshold to start if she develops fever, leukocytosis, clinical symptoms  Metastatic adenocarcinoma, unclear original primary site Bony metastases with radicular symptoms -Appreciate oncology input.  Less likely that this is metastatic breast cancer since her original diagnosis was DCIS in 2021 -Plan for CT scan of the abdomen to evaluate for possible primary source, PET scan when she is an outpatient -CEA and CA 19-9 are both normal -No evidence of extracorporal tumor impacting C-spine on her MRI.  Will need to involve radiation oncology and neurosurgery to ensure she does not require treatment.  Will also need to complete MRI of the thoracic and lumbar sacral spine to look for lesions here as well -I was able to move her bronchoscopy/EBUS up to 9/9, should add more information and hopefully allow [place] to determine adenocarcinoma primary   Best Practice (right click and "Reselect all SmartList Selections" daily)  Per primary   Labs   CBC: Recent Labs  Lab 07/29/23 1349 07/29/23 1547  WBC 9.7 11.5*  HGB 12.9 14.1  HCT 39.9 44.0  MCV 93.0 96.1  PLT 418* 356    Basic Metabolic Panel: Recent Labs  Lab 07/29/23 1349 07/29/23 1547  NA 137  --   K 3.4*  --   CL 98  --   CO2 26  --   GLUCOSE 92  --   BUN 7  --   CREATININE 0.72 0.77  CALCIUM 9.3  --   MG  --  1.9   GFR: Estimated Creatinine Clearance: 78.1 mL/min (by C-G formula based on SCr of 0.77 mg/dL). Recent Labs  Lab 07/29/23 1349 07/29/23 1547  WBC 9.7 11.5*    Liver Function  Tests: Recent Labs  Lab 07/29/23 1349  AST 27  ALT 15  ALKPHOS 92  BILITOT 0.6  PROT 7.1  ALBUMIN 3.2*   No results for input(s): "LIPASE", "AMYLASE" in the last 168 hours. No results for input(s): "AMMONIA" in the last 168 hours.  ABG No results found for: "PHART", "PCO2ART", "PO2ART", "HCO3", "TCO2", "ACIDBASEDEF", "O2SAT"   Coagulation Profile: No results for input(s): "INR", "PROTIME" in the last 168 hours.  Cardiac Enzymes: No results for input(s): "CKTOTAL", "CKMB", "CKMBINDEX", "TROPONINI" in the last 168 hours.  HbA1C: No results found for: "HGBA1C"  CBG: No results for input(s): "GLUCAP" in the last 168 hours.  Review of Systems:   Review of Systems  Constitutional:  Negative for chills and fever.  Respiratory:  Positive for shortness of breath. Negative for cough, hemoptysis, sputum production and wheezing.   Cardiovascular:  Positive for chest pain. Negative for leg swelling.  Gastrointestinal:  Negative for nausea and vomiting.  Musculoskeletal:  Positive for joint pain and neck pain. Negative for falls.  Neurological:  Negative for dizziness, sensory change, focal weakness and weakness.   Levy Pupa, MD, PhD 07/30/2023, 9:10 AM [facility] [phone] or if no answer before 7:00PM call [phone] For any issues after 7:00PM please call eLink [phone]

## 2023-07-30 NOTE — Progress Notes (Signed)
Called patient placement and notified Corrie Dandy that the pt would be here through the night related to the MRI contrast needing 24 hours and the earliest she can have the MRI is 0500 on 07/31/23. She will let the [facility] person know.

## 2023-07-30 NOTE — Progress Notes (Signed)
PROGRESS NOTE    Sherri Schultz  IEP:[id number] DOB: January 15, 1966 DOA: 07/29/2023 PCP: Elizabeth Palau, FNP    Brief Narrative:   Sherri Schultz is a 57 y.o. female with past medical history of obstructive sleep apnea, left breast cancer, right leg malignant melanoma, migraine, hypertension and anxiety was sent from urgent care with hypoxia.  Of note patient was recently seen at the primary care office on 07/09/2023 with exertional shortness of breath and bilateral neck and shoulder/chest pain with voice change for several weeks.  Chest x-ray was abnormal and was treated for GERD pneumonitis.  Follow-up chest CT showed midsternal lymphadenopathy with patchy bilateral infiltrates with left pleural effusion. HSP panel, fungal, and autoimmune labs negative except positive ANA.  Patient had planned EBUS for 08/11/2023 with Dr. Delton Coombes.  Underwent IR left thoracentesis on 07/18/23 which was positive for adenocarcinoma.  Patient does have history of left breast cancer (DCIS dx 11/2019) s/p bilateral lumpectomies, adjuvant radiation and maintained on tamoxifen since 05/2020 followed by Dr. Al Pimple.  Patient had stopped taking her tamoxifen one year ago.  Patient had initially  called PCCM office, she was sent to urgent care center, she was found to be hypoxic so she was sent to the ED for further workup.  In the ED, patient was hemodynamically stable except for hypoxia saturating 88% on room air.  Pulmonary was also consulted.  CT scan of the chest was done which was negative for pulmonary embolism but persistent mediastinal lymphadenopathy with a large left and small right pleural effusion progressive since previous CT scan with diffuse interstitial and alveolar infiltrate..  CT of the C-spine showed C4 compression fracture with severe height loss around 80% with 2 mm bony retropulsion highly suspicious for pathological fracture.  Patient was then admitted hospital for further evaluation and treatment.  Assessment and  plan.  Acute hypoxic respiratory failure likely secondary to pulmonary infiltrates with adenocarcinoma of unknown primary:  Results from recent pleural fluid on 07/18/2023 shows adenocarcinoma.  CT pulmonary embolism protocol without any PE.    COVID and respiratory viral panel negative.  CEA 0.8 within normal range.  CA 19-9  was12 and within normal range.  BNP low at 22.  Currently on 2 L of oxygen by nasal cannula.  Pulmonary following.   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  MRI of the C-spine showed numerous bony lesions compatible with metastatic disease with pathologic compression fracture of C4 with advanced height loss but no retropulsion or soft tissue tumor extension.  Spoke with neurosurgery for consultation in no surgical recommendations at this time advised cervical collar.   Communicated with radiation oncology who recommend MRI of the thoracic and lumbar spine with CT scan of the abdomen pelvis with contrast.   Hypokalemia: Potassium 3.4.  Been replenished.  Check levels in AM.   Anxiety disorder: Continue venlafaxine.   GERD: Continue PPI   History of DCIS of left breast: Patient was on tamoxifen which she stopped a year ago.  Patient used to follow Dr. Al Pimple.  No local recurrence.   Obstructive sleep apnea: Continue nocturnal CPAP      DVT prophylaxis: enoxaparin (LOVENOX) injection 40 mg Start: 07/29/23 1600   Code Status:     Code Status: Full Code  Disposition: Home, uncertain at this time.  Status is: Inpatient  Remains inpatient appropriate because: Hypoxic respiratory failure, malignant effusion,  workup underway, possible need for radiation treatment   Family Communication: Spoke with the patient's brother at bedside.  Consultants:  Neurosurgery Pulmonary Radiation oncology Oncology  Procedures:  None  yet  Antimicrobials:  None  Anti-infectives (From admission, onward)    None       Subjective: Today, patient was seen and examined at bedside.  Patient complains of shortness of breath, dyspnea and right shoulder/neck pain.  Has some cough without any hemoptysis.  Objective: Vitals:   07/29/23 2349 07/30/23 0352 07/30/23 0352 07/30/23 0744  BP: 104/61  116/80 116/66  Pulse: 83  78 77  Resp: 18  18 17   Temp:   98.4 F (36.9 C) 98.2 F (36.8 C)  TempSrc:   Oral Oral  SpO2: 92%  93% 90%  Weight:  73.7 kg    Height:        Intake/Output Summary (Last 24 hours) at 07/30/2023 1147 Last data filed at 07/29/2023 1700 Gross per 24 hour  Intake 240 ml  Output --  Net 240 ml   Filed Weights   07/29/23 1244 07/30/23 0352  Weight: 74.8 kg 73.7 kg    Physical Examination: Body mass index is 27.46 kg/m.  General:  Average built, not in obvious distress, on nasal cannula oxygen at 2 L/min HENT:   No scleral pallor or icterus noted. Oral mucosa is moist.  Chest:    Diminished breath sounds bilaterally.  Rhonchi noted. CVS: S1 &S2 heard. No murmur.  Regular rate and rhythm. Abdomen: Soft, nontender, nondistended.  Bowel sounds are heard.   Extremities: No cyanosis, clubbing or edema.  Peripheral pulses are palpable. Psych: Alert, awake and oriented, normal mood CNS:  No cranial nerve deficits.  Power equal in all extremities.   Skin: Warm and dry.  No rashes noted.  Data Reviewed:   CBC: Recent Labs  Lab 07/29/23 1349 07/29/23 1547 07/30/23 0910  WBC 9.7 11.5* 9.1  HGB 12.9 14.1 12.4  HCT 39.9 44.0 38.1  MCV 93.0 96.1 96.0  PLT 418* 356 352    Basic Metabolic Panel: Recent Labs  Lab 07/29/23 1349 07/29/23 1547 07/30/23 0910  NA 137  --  135  K 3.4*  --  3.6  CL 98  --  105  CO2 26  --  20*  GLUCOSE 92  --  111*  BUN 7  --  5*  CREATININE 0.72 0.77 0.66  CALCIUM 9.3  --  8.7*  MG  --  1.9  --     Liver Function Tests: Recent Labs  Lab 07/29/23 1349  07/30/23 0910  AST 27 23  ALT 15 12  ALKPHOS 92 79  BILITOT 0.6 0.2*  PROT 7.1 6.3*  ALBUMIN 3.2* 2.9*     Radiology Studies: MR CERVICAL SPINE W WO CONTRAST  Result Date: 07/30/2023 CLINICAL DATA:  Abnormal CT. EXAM: MRI CERVICAL SPINE WITHOUT AND WITH CONTRAST TECHNIQUE: Multiplanar and multiecho pulse sequences of the cervical spine, to include the craniocervical junction and cervicothoracic junction, were obtained without and with intravenous contrast. CONTRAST:  7mL GADAVIST GADOBUTROL 1 MMOL/ML IV SOLN COMPARISON:  Cervical spine CT from yesterday FINDINGS: Alignment: Physiologic. Vertebrae: Infiltrating bone lesions seen at C2, C4, C5, C6, C7, T1, T2, and T3 vertebral bodies. Posterior element involvement at C4 and leftward at C5, C6. Known C4 pathologic fracture with advanced height loss. No extraosseous tumor infiltration or retropulsion. Cord: Normal signal and morphology Posterior Fossa, vertebral arteries, paraspinal tissues: No perispinal hematoma or spinal canal collection.  Sizable left pleural effusion. Disc levels: No significant degenerative change. IMPRESSION: Numerous bone lesions compatible with metastatic disease. Known pathologic compression fracture at C4 with advanced height loss but no retropulsion or soft tissue tumor extension. Electronically Signed   By: Tiburcio Pea M.D.   On: 07/30/2023 06:11   CT CERVICAL SPINE W CONTRAST  Addendum Date: 07/29/2023   ADDENDUM REPORT: 07/29/2023 19:01 ADDENDUM: Findings discussed with Dr. Kandace Blitz via telephone at 6:58 p.m. Electronically Signed   By: Feliberto Harts M.D.   On: 07/29/2023 19:01   Result Date: 07/29/2023 CLINICAL DATA:  Metastatic disease evaluation EXAM: CT CERVICAL SPINE WITH CONTRAST TECHNIQUE: Multidetector CT imaging of the cervical spine was performed during intravenous contrast administration. Multiplanar CT image reconstructions were also generated. RADIATION DOSE REDUCTION: This exam was performed according  to the departmental dose-optimization program which includes automated exposure control, adjustment of the mA and/or kV according to patient size and/or use of iterative reconstruction technique. CONTRAST:  OMNIPAQUE IOHEXOL 350 MG/ML SOLN COMPARISON:  None Available. FINDINGS: Motion limited study. Alignment: Bony retropulsion at C4 is detailed below. Otherwise, no substantial sagittal subluxation. Skull base and vertebrae: C4 compression fracture with severe (approximately 80%) height loss and approximately 2 mm of bony retropulsion. Irregular lucency and sclerosis associated with vertebral body. Soft tissues and spinal canal: No prevertebral fluid or swelling. No visible canal hematoma. Disc levels: Mild-to-moderate multilevel degenerative change. The above retropulsion likely result in mild canal stenosis at C4. Upper chest: Please see same day CT of the chest for further evaluation. IMPRESSION: C4 compression fracture with severe (approximately 80%) height loss, 2 mm of bony retropulsion, and heterogeneous sclerosis/lucency. Findings are highly suspicious for a pathologic fracture. MRI of the cervical spine with contrast could further assess this level as well as better assess for additional lesions if clinically warranted. Electronically Signed: By: Feliberto Harts M.D. On: 07/29/2023 18:30   CT Angio Chest PE W and/or Wo Contrast  Result Date: 07/29/2023 CLINICAL DATA:  Pulmonary embolus suspected with high probability. Shortness of breath. Bronchoscopy 2 weeks ago. EXAM: CT ANGIOGRAPHY CHEST WITH CONTRAST TECHNIQUE: Multidetector CT imaging of the chest was performed using the standard protocol during bolus administration of intravenous contrast. Multiplanar CT image reconstructions and MIPs were obtained to evaluate the vascular anatomy. RADIATION DOSE REDUCTION: This exam was performed according to the departmental dose-optimization program which includes automated exposure control, adjustment  of the mA and/or kV according to patient size and/or use of iterative reconstruction technique. CONTRAST:  OMNIPAQUE IOHEXOL 350 MG/ML SOLN COMPARISON:  Chest radiograph 07/29/2023.  CT chest 07/11/2023 FINDINGS: Cardiovascular: There is good opacification of the central and segmental pulmonary arteries. No focal filling defects. No evidence of significant pulmonary embolus. Normal heart size. No pericardial effusions. Normal caliber thoracic aorta. No aortic dissection. Mediastinum/Nodes: Thyroid gland is unremarkable. Esophagus is decompressed. Mediastinal lymphadenopathy. Largest right paratracheal nodes measure 2.2 cm diameter. Similar appearance to prior study. This may indicate metastatic disease. Lungs/Pleura: Large left and small right pleural effusions, progressing since the prior CT study. Diffuse interstitial and patchy alveolar infiltrates throughout the lungs, also progressing. Changes are most likely to represent edema or multifocal pneumonia. Aspiration could also have this appearance. Underlying primary or metastatic disease could also be considered. Upper Abdomen: Diffuse fatty infiltration of the liver. No focal lesions. Musculoskeletal: No chest wall abnormality. No acute or significant osseous findings. Review of the MIP images confirms the above findings. IMPRESSION: 1. No evidence of significant pulmonary embolus. 2. Persistent  finding of mediastinal lymphadenopathy, unchanged. 3. Large left and small right pleural effusions are progressing since prior CT. 4. Diffuse interstitial and alveolar infiltrates throughout both lungs is also progressing. Changes may represent edema or multifocal pneumonia. Aspiration or underlying neoplasm could also be considered. Electronically Signed   By: Burman Nieves M.D.   On: 07/29/2023 18:40   DG Chest 2 View  Result Date: 07/29/2023 CLINICAL DATA:  Wheezing and shortness of breath. Recent left-sided thoracentesis with evidence of malignant  effusion. History of breast cancer. EXAM: CHEST - 2 VIEW COMPARISON:  Chest x-ray dated July 21, 2023. FINDINGS: The heart size and mediastinal contours are within normal limits. Fairly diffuse interstitial thickening is similar, with increasing irregular airspace opacities in the left lung. No pneumothorax or pleural effusion. No acute osseous abnormality. Similar exaggerated thoracic kyphosis. IMPRESSION: 1. 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 Superimposed pneumonia in the left lung is possible. Electronically Signed   By: Obie Dredge M.D.   On: 07/29/2023 14:01      LOS: 1 day    Joycelyn Das, MD [facility] Available via Epic secure chat 7am-7pm After these hours, please refer to coverage provider listed on [email or web address] 07/30/2023, 11:47 AM

## 2023-07-31 ENCOUNTER — Inpatient Hospital Stay (HOSPITAL_COMMUNITY): Payer: BC Managed Care – PPO

## 2023-07-31 ENCOUNTER — Ambulatory Visit
Admission: RE | Admit: 2023-07-31 | Discharge: 2023-07-31 | Disposition: A | Discharge status: Home / Self Care | Payer: BC Managed Care – PPO | Source: Ambulatory Visit | Attending: Radiation Oncology | Admitting: Radiation Oncology

## 2023-07-31 ENCOUNTER — Other Ambulatory Visit: Payer: Self-pay | Admitting: Radiation Oncology

## 2023-07-31 DIAGNOSIS — C7801 Secondary malignant neoplasm of right lung: Secondary | ICD-10-CM | POA: Diagnosis not present

## 2023-07-31 DIAGNOSIS — J90 Pleural effusion, not elsewhere classified: Secondary | ICD-10-CM | POA: Diagnosis not present

## 2023-07-31 DIAGNOSIS — C7951 Secondary malignant neoplasm of bone: Secondary | ICD-10-CM | POA: Insufficient documentation

## 2023-07-31 DIAGNOSIS — C801 Malignant (primary) neoplasm, unspecified: Secondary | ICD-10-CM | POA: Insufficient documentation

## 2023-07-31 DIAGNOSIS — Z510 Encounter for antineoplastic radiation therapy: Secondary | ICD-10-CM | POA: Insufficient documentation

## 2023-07-31 DIAGNOSIS — C7802 Secondary malignant neoplasm of left lung: Secondary | ICD-10-CM | POA: Diagnosis not present

## 2023-07-31 LAB — CBC
HCT: 39.8 % (ref 36.0–46.0)
Hemoglobin: 13 g/dL (ref 12.0–15.0)
MCH: 30.9 pg (ref 26.0–34.0)
MCHC: 32.7 g/dL (ref 30.0–36.0)
MCV: 94.5 fL (ref 80.0–100.0)
Platelets: 374 10*3/uL (ref 150–400)
RBC: 4.21 MIL/uL (ref 3.87–5.11)
RDW: 12.6 % (ref 11.5–15.5)
WBC: 9.1 10*3/uL (ref 4.0–10.5)
nRBC: 0 % (ref 0.0–0.2)

## 2023-07-31 LAB — BASIC METABOLIC PANEL
Anion gap: 10 (ref 5–15)
BUN: <5 mg/dL — ABNORMAL LOW (ref 6–20)
CO2: 26 mmol/L (ref 22–32)
Calcium: 9.1 mg/dL (ref 8.9–10.3)
Chloride: 101 mmol/L (ref 98–111)
Creatinine, Ser: 0.76 mg/dL (ref 0.44–1.00)
GFR, Estimated: >60 mL/min (ref >60)
Glucose, Bld: 96 mg/dL (ref 70–99)
Potassium: 3.7 mmol/L (ref 3.5–5.1)
Sodium: 137 mmol/L (ref 135–145)

## 2023-07-31 LAB — CYTOLOGY - NON PAP

## 2023-07-31 LAB — MAGNESIUM: Magnesium: 2 mg/dL (ref 1.7–2.4)

## 2023-07-31 MED ORDER — LORAZEPAM 0.5 MG PO TABS
0.5000 mg | ORAL_TABLET | Freq: Once | ORAL | Status: AC | PRN
  Administered 2023-07-31: 0.5 mg via ORAL
  Filled 2023-07-31: qty 1

## 2023-07-31 MED ORDER — LORAZEPAM 0.5 MG PO TABS: ORAL_TABLET | ORAL | 0 refills | Status: DC

## 2023-07-31 MED ORDER — GADOBUTROL 1 MMOL/ML IV SOLN
7.0000 mL | Freq: Once | INTRAVENOUS | Status: AC | PRN
  Administered 2023-07-31: 7 mL via INTRAVENOUS

## 2023-07-31 NOTE — Progress Notes (Signed)
Patient Bipap set up and placed on patient as ordered. Patient stated she wanted to try the Bipap for the night due to feeling SOB. Patient kept mask on her face for 2-3 minutes then stated she couldn't tolerate it due to feeling as if she is suffocating. This RT asked patient if she wanted to try the nasal mask and she stated no that she would be fine on just O2. Pt placed on 3L via nasal cannula. Tol well. Pt no longer experiencing SOB

## 2023-07-31 NOTE — Progress Notes (Signed)
Orthopedic Tech Progress Note Patient Details:  Sherri Schultz Oct 11, 1966 [id number]  Ortho Devices Type of Ortho Device: Soft collar Ortho Device/Splint Interventions: Ordered, Application, Adjustment   Post Interventions Patient Tolerated: Well Instructions Provided: Adjustment of device, Care of device  [name] 07/31/2023, 11:02 AM

## 2023-07-31 NOTE — Progress Notes (Signed)
Radiation oncology note read.  MRI of thoracic and lumbar spine reviewed.  It has not been read by radiology at this point.  She has diffuse metastases throughout the thoracic and lumbar spine without canal or cord involvement.  Palliative radiation therapy to the cervical spine has been scheduled.  She will also likely need treatment to the thoracic and lumbar spine.  I do not see anything that requires surgical intervention at this time.  Not sure of the prognosis since I am not an oncologist, but I suspect most treatment here will be palliative in nature.

## 2023-07-31 NOTE — Progress Notes (Signed)
   07/31/23 1347  TOC Brief Assessment  Insurance and Status Reviewed  Patient has primary care physician Yes Laretta Bolster MD)  Home environment has been reviewed Yes Home with husband  Prior level of function: Independent  Social Determinants of Health Reivew SDOH reviewed no interventions necessary  Readmission risk has been reviewed Yes  Transition of care needs no transition of care needs at this time

## 2023-07-31 NOTE — Progress Notes (Signed)
   07/31/23 2103  BiPAP/CPAP/SIPAP  BiPAP/CPAP/SIPAP Pt Type Adult  Reason BIPAP/CPAP not in use Non-compliant (Patient refused CPAP qhs and instead wishes to wear her nasal cannula tonight while sleeping. Patient and spouse in room encouraged to contact RT should she change her mind.)

## 2023-07-31 NOTE — Progress Notes (Signed)
To see the patient today to discuss the findings from her MRI results of the thoracic and lumbar spine.  While she does have what appears to be diffuse metastatic disease in these areas, there does not appear to be any epidural extension or encroachment and as the patient is asymptomatic in these areas at this time we will solely focus on her cervical spine for palliative radiation.  We discussed the logistics of therapy and the need to make a mask for her to wear for immobilization while receiving her treatment.  This will be made today.  She does have some claustrophobia and because of this I offered her Ativan to be taken 30 minutes prior to her simulation and subsequently for treatment.  I did send in a new prescription to her outpatient pharmacy for this after she leaves the hospital. Written consent is obtained and placed in the chart, a copy was provided to the patient.     Osker Mason, PAC

## 2023-07-31 NOTE — Consult Note (Signed)
NAME:  Sherri Schultz, MRN:  [id number], DOB:  1966/05/25, LOS: 2 ADMISSION DATE:  07/29/2023, CONSULTATION DATE:  07/29/23 REFERRING MD:  Trisha Mangle, PA, CHIEF COMPLAINT:  SOB/ hypoxia  History of Present Illness:   30 yoF with PMH of never smoker, OSA (CPAP ordered), left breast CA, migraines, melanoma (right leg), HTN, and anxiety who was sent from UC after being found hypoxic in the mid 80's on room with progressive SOB.  Also reports ongoing bilateral shoulder and neck pain with new radiation into right arm down to elbow without weakness or numbness since yesterday.  States its hard to lift her head off the bed at times.    Seen by Dr. Delton Coombes in our office 07/09/2023 after several weeks of exertional SOB with bilateral shoulder, neck and intermittent chest discomfort and voice change.  Pulmonary workup initiated, found to have abnormal CXR, empirically treated for possible GERD/ pneumonitis.  Follow-up chest CT which showed mediastinal adenopathy with patchy bilateral infiltrates with left pleural effusion.  HSP panel, fungal, and autoimmune labs negative except positive ANA.  Planned EBUS for 08/11/2023 with Dr. Delton Coombes.  Underwent IR left thoracentesis on 8/23 yielding 600 ml of yellow fluid; pleural labs consistent with lymphocytic exudate by Light's criteria, however cytology resulted on 8/30 positive for adenocarcinoma.    Of note, history of left breast cancer (DCIS dx 11/2019) s/p bilateral lumpectomies, adjuvant radiation and maintained on tamoxifen since 05/2020 followed by Dr. Al Pimple.  Patient reports she stopped taking her tamoxifen one year ago; oncology notes do not reflect this. Denies any recent fever, chills, productive cough/ hemoptysis, falls, LE edema/ pain/ swelling.  Has chronic low back pain.   Afebrile, normotensive and requiring 3L [place] without respiratory distress in ER.  Labs pending.  CXR showing progressive bilateral infiltrates, more extensive in left.  Pulmonary consulted for  further recommendations, likely to be [facility] to [facility].   Pertinent  Medical History  Never smoker, OSA, left breast CA, migraines, melanoma (right leg), HTN, anxiety  Significant Hospital Events: Including procedures, antibiotic start and stop dates in addition to other pertinent events   Left pleural fluid 8/23 >> CK7 positive adenocarcinoma (unclear lineage) CT-PA 9/3 >> no pulmonary embolism, persistent stable mediastinal adenopathy right paratracheal region, left hilar region.  Large left and small right pleural effusions (increased).  Progressive bilateral scattered groundglass/nodular infiltrates. CT cervical spine 9/3 >> bony retropulsion at C4 associated with severe compression fracture and vertebral body lucency concerning for pathologic fracture MRI C-spine 9/4 >> infiltrating bony lesions noted at C2, C4, C5, C6, C7, T1, T2, T3 bodies.  The posterior element at C4 involved with associated fracture.  No extraosseous tumor infiltration or retropulsion noted. MRI T-spine, L-spine 9/5 >> multiple infiltrating bony lesions without any tumor protrusion or impact on the cord.  Interim History / Subjective:   Objective   Blood pressure 123/80, pulse 81, temperature 98.4 F (36.9 C), temperature source Oral, resp. rate 16, height 5' 4.5" (1.638 m), weight 73.9 kg, last menstrual period 03/27/2011, SpO2 93%.        Intake/Output Summary (Last 24 hours) at 07/31/2023 1104 Last data filed at 07/30/2023 2000 Gross per 24 hour  Intake 480 ml  Output --  Net 480 ml   Filed Weights   07/30/23 0352 07/31/23 0449 07/31/23 1030  Weight: 73.7 kg 74.5 kg 73.9 kg   Examination: General: Pleasant woman, no distress on nasal cannula O2 HEENT: No secretions, no stridor, oropharynx clear Neuro: Awake, alert, good strength  bilaterally.  Follows commands PULM: Crackles on the left, decreased at left base, right mostly clear Extremities: No edema Skin: No rash #  Resolved Hospital Problem list     Assessment & Plan:   Acute hypoxic respiratory failure  Extensive bilateral airspace opacities consistent with malignancy given her recent positive cytology for adenocarcinoma in left pleural effusion s/p IR thora 8/23. Recurrent left and small right pleural effusions -Discussed with her the possible benefits of a repeat left thoracentesis, possibly even a Pleurx catheter depending on reaccumulation of her pleural fluid. -Have deferred antibiotics for now, low threshold to start if she develops clinical symptoms, fever, leukocytosis  Metastatic adenocarcinoma, unclear original primary site.  Based on workup to date I suspect this may be primary lung cancer although TTF-1 was negative on her pleural fluid cytology Bony metastases with radicular symptoms -Appreciate oncology, radiation oncology input.  CT of the abdomen reassuring, her breast cancer was DCIS in 2021 which makes metastatic disease less likely.  Ultimately this may be primary lung adenocarcinoma.  We are scheduled for bronchoscopy/EBUS on 9/9 at [facility] -She is going to [facility] today for XRT planning   Best Practice (right click and "Reselect all SmartList Selections" daily)  Per primary   Labs   CBC: Recent Labs  Lab 07/29/23 1349 07/29/23 1547 07/30/23 0910 07/31/23 0832  WBC 9.7 11.5* 9.1 9.1  HGB 12.9 14.1 12.4 13.0  HCT 39.9 44.0 38.1 39.8  MCV 93.0 96.1 96.0 94.5  PLT 418* 356 352 374    Basic Metabolic Panel: Recent Labs  Lab 07/29/23 1349 07/29/23 1547 07/30/23 0910 07/31/23 0832  NA 137  --  135 137  K 3.4*  --  3.6 3.7  CL 98  --  105 101  CO2 26  --  20* 26  GLUCOSE 92  --  111* 96  BUN 7  --  5* <5*  CREATININE 0.72 0.77 0.66 0.76  CALCIUM 9.3  --  8.7* 9.1  MG  --  1.9  --  2.0   GFR: Estimated Creatinine Clearance: 78.2 mL/min (by C-G formula based on SCr of 0.76 mg/dL). Recent Labs  Lab 07/29/23 1349 07/29/23 1547 07/30/23 0910 07/31/23 0832  WBC 9.7 11.5* 9.1 9.1    Liver  Function Tests: Recent Labs  Lab 07/29/23 1349 07/30/23 0910  AST 27 23  ALT 15 12  ALKPHOS 92 79  BILITOT 0.6 0.2*  PROT 7.1 6.3*  ALBUMIN 3.2* 2.9*     Levy Pupa, MD, PhD 07/31/2023, 11:04 AM [facility] [phone] or if no answer before 7:00PM call [phone] For any issues after 7:00PM please call eLink [phone]

## 2023-07-31 NOTE — Progress Notes (Signed)
PROGRESS NOTE    Sherri Schultz  UYQ:[id number] DOB: [birth year] DOA: 07/29/2023 PCP: Man Effertz Palau, FNP   Brief Narrative:  57 y.o. female with past medical history of obstructive sleep apnea, left breast cancer, right leg malignant melanoma, migraine, hypertension and anxiety was sent from urgent care with hypoxia.  Of note patient was recently seen at the primary care office on 07/09/2023 with exertional shortness of breath and bilateral neck and shoulder/chest pain with voice change for several weeks.  Chest x-ray was abnormal and was treated for GERD pneumonitis.  Follow-up chest CT showed midsternal lymphadenopathy with patchy bilateral infiltrates with left pleural effusion. HSP panel, fungal, and autoimmune labs negative except positive ANA.  Patient had planned EBUS for 08/11/2023 with Dr. Delton Coombes.  Underwent IR left thoracentesis on 07/18/23 which was positive for adenocarcinoma.  Patient does have history of left breast cancer (DCIS dx 11/2019) s/p bilateral lumpectomies, adjuvant radiation and maintained on tamoxifen since 05/2020 followed by Dr. Al Pimple.  Patient had stopped taking her tamoxifen one year ago.  Patient had initially  called PCCM office, she was sent to urgent care center, she was found to be hypoxic so she was sent to the ED for further workup.  In the ED, patient was hemodynamically stable except for hypoxia saturating 88% on room air.  Pulmonary was also consulted.  CT scan of the chest was done which was negative for pulmonary embolism but persistent mediastinal lymphadenopathy with a large left and small right pleural effusion progressive since previous CT scan with diffuse interstitial and alveolar infiltrate..  CT of the C-spine showed C4 compression fracture with severe height loss around 80% with 2 mm bony retropulsion highly suspicious for pathological fracture.  Patient was then admitted hospital for further evaluation and treatment.  Mri c spine-Numerous bone lesions compatible  with metastatic disease.  Known pathologic compression fracture at C4 with advanced height loss but no retropulsion or soft tissue tumor extension. MRI lumbar spine-Widespread bone metastases with all lumbar levels affected. No pathologic fracture. No epidural or extraosseous tumor identified.  Mild for age underlying lumbar spine degeneration with no spinal or foraminal stenosis. But asymmetric and degenerated facets at L4-L5. Asymmetric signal associated with the right facet could be degenerative and/or metastasis related. MRI thoracic spine- Widespread bone metastases with virtually all Thoracic levels affected. No pathologic fracture. No epidural or extraosseous tumor identified.Mild for age underlying thoracic spine degeneration. No spinal or foraminal stenosis.Abnormal Chest including left greater than right pleural effusions. 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  CT pulmonary embolism protocol without any PE.    COVID and respiratory viral panel negative.  CEA 0.8 within normal range.  CA 19-9  was12 and within normal range.  BNP low at 22.   Requiring 5 to 6 L of oxygen to maintain saturation.  Pulmonary following. 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  MRI of the C-spine showed numerous bony lesions compatible with metastatic disease with pathologic compression fracture of C4 with  advanced height loss but no retropulsion or soft  tissue tumor extension.  Spoke with neurosurgery for consultation in no surgical recommendations at this time advised cervical collar.   Appreciate IR input.  Plan for palliative radiation to cervical noted. Neurosurgery notes reviewed no plans for surgery.   Hypokalemia: Resolved  Anxiety disorder: Continue venlafaxine.   GERD: Continue PPI   History of DCIS of left breast: Patient was on tamoxifen which she stopped a year ago.  Patient used to follow Dr. Al Pimple.  No local recurrence.   Obstructive sleep apnea: Continue nocturnal CPAP  Estimated body mass index is 27.53 kg/m as calculated from the following:   Height as of this encounter: 5' 4.5" (1.638 m).   Weight as of this encounter: 73.9 kg.  DVT prophylaxis: LOVENOX Code Status:full Family Communication:none Disposition Plan:  Status is: Inpatient Remains inpatient appropriate because:acute hypoxia   Consultants: pccm, neurosurgery, radiation oncology, oncology  Procedures: None Antimicrobials: None  Subjective: Patient seen resting in bed, on 5 to 6 L of oxygen C/o short of breath and back pain  Objective: Vitals:   07/31/23 0812 07/31/23 1030 07/31/23 1034 07/31/23 1123  BP: 104/65  123/80   Pulse: 78  81 89  Resp: 17  16   Temp: 98.4 F (36.9 C)  98.4 F (36.9 C)   TempSrc: Oral  Oral   SpO2: 93%  93% (!) 80%  Weight:  73.9 kg    Height:  5' 4.5" (1.638 m)      Intake/Output Summary (Last 24 hours) at 07/31/2023 1306 Last data filed at 07/30/2023 2000 Gross per 24 hour  Intake 240 ml  Output --  Net 240 ml   Filed Weights   07/30/23 0352 07/31/23 0449 07/31/23 1030  Weight: 73.7 kg 74.5 kg 73.9 kg    Examination:  General exam: Appears in no acute distress Respiratory system: Diminished breath sounds to auscultation. Respiratory effort normal. Cardiovascular system: S1 & S2 heard, RRR. No JVD, murmurs, rubs, gallops or clicks. No pedal edema. Gastrointestinal system: Abdomen is nondistended, soft  and nontender. No organomegaly or masses felt. Normal bowel sounds heard. Central nervous system: Alert and oriented.  Extremities: Symmetric 5 x 5 power.  Data Reviewed: I have personally reviewed following labs and imaging studies  CBC: Recent Labs  Lab 07/29/23 1349 07/29/23 1547 07/30/23 0910 07/31/23 0832  WBC 9.7 11.5* 9.1 9.1  HGB 12.9 14.1 12.4 13.0  HCT 39.9 44.0 38.1 39.8  MCV 93.0 96.1 96.0 94.5  PLT 418* 356 352 374   Basic Metabolic Panel: Recent Labs  Lab 07/29/23 1349 07/29/23 1547 07/30/23 0910 07/31/23 0832  NA 137  --  135 137  K 3.4*  --  3.6 3.7  CL 98  --  105 101  CO2 26  --  20* 26  GLUCOSE 92  --  111* 96  BUN 7  --  5* <5*  CREATININE 0.72 0.77 0.66 0.76  CALCIUM 9.3  --  8.7* 9.1  MG  --  1.9  --  2.0   GFR: Estimated Creatinine Clearance: 78.2 mL/min (by C-G formula based on SCr of 0.76 mg/dL). Liver Function Tests: Recent Labs  Lab 07/29/23 1349 07/30/23 0910  AST 27 23  ALT 15 12  ALKPHOS 92 79  BILITOT 0.6 0.2*  PROT 7.1 6.3*  ALBUMIN 3.2* 2.9*   No results for input(s): "LIPASE", "AMYLASE" in the last 168 hours. No results for input(s): "AMMONIA" in the last 168 hours. Coagulation  Profile: No results for input(s): "INR", "PROTIME" in the last 168 hours. Cardiac Enzymes: No results for input(s): "CKTOTAL", "CKMB", "CKMBINDEX", "TROPONINI" in the last 168 hours. BNP (last 3 results) No results for input(s): "PROBNP" in the last 8760 hours. HbA1C: No results for input(s): "HGBA1C" in the last 72 hours. CBG: No results for input(s): "GLUCAP" in the last 168 hours. Lipid Profile: No results for input(s): "CHOL", "HDL", "LDLCALC", "TRIG", "CHOLHDL", "LDLDIRECT" in the last 72 hours. Thyroid Function Tests: Recent Labs    07/29/23 1549  TSH 1.725   Anemia Panel: No results for input(s): "VITAMINB12", "FOLATE", "FERRITIN", "TIBC", "IRON", "RETICCTPCT" in the last 72 hours. Sepsis Labs: No results for input(s):  "PROCALCITON", "LATICACIDVEN" in the last 168 hours.  Recent Results (from the past 240 hour(s))  SARS Coronavirus 2 by RT PCR (hospital order, performed in [facility] hospital lab) *cepheid single result test* Anterior Nasal Swab     Status: None   Collection Time: 07/29/23  3:45 PM   Specimen: Anterior Nasal Swab  Result Value Ref Range Status   SARS Coronavirus 2 by RT PCR NEGATIVE NEGATIVE Final    Comment: Performed at [facility], [address] [place], [place] [address]  Respiratory (~20 pathogens) panel by PCR     Status: None   Collection Time: 07/29/23  3:46 PM   Specimen: Nasopharyngeal Swab; Respiratory  Result Value Ref Range Status   Adenovirus NOT DETECTED NOT DETECTED Final   Coronavirus 229E NOT DETECTED NOT DETECTED Final    Comment: (NOTE) The Coronavirus on the Respiratory Panel, DOES NOT test for the novel  Coronavirus (2019 nCoV)    Coronavirus HKU1 NOT DETECTED NOT DETECTED Final   Coronavirus NL63 NOT DETECTED NOT DETECTED Final   Coronavirus OC43 NOT DETECTED NOT DETECTED Final   Metapneumovirus NOT DETECTED NOT DETECTED Final   Rhinovirus / Enterovirus NOT DETECTED NOT DETECTED Final   Influenza A NOT DETECTED NOT DETECTED Final   Influenza B NOT DETECTED NOT DETECTED Final   Parainfluenza Virus 1 NOT DETECTED NOT DETECTED Final   Parainfluenza Virus 2 NOT DETECTED NOT DETECTED Final   Parainfluenza Virus 3 NOT DETECTED NOT DETECTED Final   Parainfluenza Virus 4 NOT DETECTED NOT DETECTED Final   Respiratory Syncytial Virus NOT DETECTED NOT DETECTED Final   Bordetella pertussis NOT DETECTED NOT DETECTED Final   Bordetella Parapertussis NOT DETECTED NOT DETECTED Final   Chlamydophila pneumoniae NOT DETECTED NOT DETECTED Final   Mycoplasma pneumoniae NOT DETECTED NOT DETECTED Final    Comment: Performed at [facility], [address] [place], [place] [address]         Radiology Studies: MR Lumbar Spine W Wo Contrast  Result Date:  07/31/2023 CLINICAL DATA:  57 year old female with osseous metastatic disease. History of breast cancer. EXAM: MRI LUMBAR SPINE WITHOUT AND WITH CONTRAST TECHNIQUE: Multiplanar and multiecho pulse sequences of the lumbar spine were obtained without and with intravenous contrast. CONTRAST:  7mL GADAVIST GADOBUTROL 1 MMOL/ML IV SOLN COMPARISON:  Thoracic MRI today reported separately. CT Abdomen and Pelvis yesterday. FINDINGS: Segmentation:  Normal, concordant with the thoracic numbering today. Alignment:  Normal lumbar lordosis. Vertebrae: Widespread osseous metastatic disease with all lumbar levels affected. Partially visible sacral and pelvic T1 hypointense and enhancing bone metastases also. No lumbar compression fracture. Conus medullaris and cauda equina: Conus extends to the T12 level. No lower spinal cord or conus signal abnormality. Normal cauda equina nerve roots. No abnormal intradural enhancement or dural thickening  identified on mildly motion degraded sagittal and moderately motion degraded axial postcontrast imaging. Paraspinal and other soft tissues: Stable visible abdominal viscera. No lumbar epidural or extraosseous tumor identified at this time. Disc levels: Generally mild for age underlying lumbar spine degeneration, although asymmetric facet degeneration at L4-L5 greater on the right. Small facet joint fluid there (series four image 31). Unclear to what extent superimposed facet metastases could be contributing to the facet asymmetry here. But there is no associated spinal or foraminal stenosis. IMPRESSION: 1. Widespread bone metastases with all lumbar levels affected. No pathologic fracture. No epidural or extraosseous tumor identified. 2. Mild for age underlying lumbar spine degeneration with no spinal or foraminal stenosis. But asymmetric and degenerated facets at L4-L5. Asymmetric signal associated with the right facet could be degenerative and/or metastasis related. Electronically Signed   By:  Odessa Fleming M.D.   On: 07/31/2023 08:20   MR THORACIC SPINE W WO CONTRAST  Result Date: 07/31/2023 CLINICAL DATA:  57 year old female with osseous metastatic disease. History of breast cancer. EXAM: MRI THORACIC WITHOUT AND WITH CONTRAST TECHNIQUE: Multiplanar and multiecho pulse sequences of the thoracic spine were obtained without and with intravenous contrast. CONTRAST:  7mL GADAVIST GADOBUTROL 1 MMOL/ML IV SOLN COMPARISON:  Cervical spine MRI yesterday. CTA chest 07/29/2023. CT Abdomen and Pelvis yesterday. FINDINGS: Limited cervical spine imaging:  Stable since yesterday. Thoracic spine segmentation: Normal on the comparison CTs, small ribs at T12. Alignment: Stable thoracic kyphosis. No significant spondylolisthesis. Vertebrae: T1 hypointense and enhancing bone metastases at all thoracic levels. Widely scattered vertebral body and intermittent posterior element involvement. The T9 vertebral body is 1 of the least affected by MRI, suggesting prominent sclerosis there by CT could be treated metastasis. But there is right T9 posterior element involvement. No pathologic compression fracture at this time. Small T11 inferior endplate deformity which is more likely a Schmorl's node. Cord: Within normal limits. No spinal cord edema. No abnormal intradural enhancement. Conus medullaris appears normal. Paraspinal and other soft tissues: Abnormal chest including moderate to large left pleural effusion which appears to be layering. More complex appearing but smaller right pleural effusion. Some thoracic epidural lipomatosis (benign). No epidural or extraosseous tumor extension identified at this time. Disc levels: Mild for age thoracic spine degeneration. No thoracic spinal stenosis. No significant foraminal stenosis. IMPRESSION: 1. Widespread bone metastases with virtually all Thoracic levels affected. No pathologic fracture. No epidural or extraosseous tumor identified. 2. Mild for age underlying thoracic spine  degeneration. No spinal or foraminal stenosis. 3. Abnormal Chest including left greater than right pleural effusions. Electronically Signed   By: Odessa Fleming M.D.   On: 07/31/2023 08:15   CT ABDOMEN PELVIS W CONTRAST  Result Date: 07/30/2023 CLINICAL DATA:  Metastatic disease evaluation lung mass, bone mets EXAM: CT ABDOMEN AND PELVIS WITH CONTRAST TECHNIQUE: Multidetector CT imaging of the abdomen and pelvis was performed using the standard protocol following bolus administration of intravenous contrast. RADIATION DOSE REDUCTION: This exam was performed according to the departmental dose-optimization program which includes automated exposure control, adjustment of the mA and/or kV according to patient size and/or use of iterative reconstruction technique. CONTRAST:  75mL OMNIPAQUE IOHEXOL 350 MG/ML SOLN COMPARISON:  CT angiography chest 07/29/2023, CT angiography chest 07/11/2023 FINDINGS: Lower chest: Irregular interstitial and patchy airspace opacities of visualized lower lungs again noted. Persistent moderate volume partially visualized left pleural effusion with question associate pleural thickening. Trace right pleural effusion. Hepatobiliary: No focal liver abnormality. No gallstones, gallbladder wall thickening, or pericholecystic fluid.  No biliary dilatation. Pancreas: No focal lesion. Normal pancreatic contour. No surrounding inflammatory changes. No main pancreatic ductal dilatation. Spleen: Normal in size without focal abnormality. Adrenals/Urinary Tract: No adrenal nodule bilaterally. Bilateral kidneys enhance symmetrically. No hydronephrosis. No hydroureter. The urinary bladder is unremarkable. Stomach/Bowel: PO contrast reaches colon. Stomach is within normal limits. No evidence of bowel wall thickening or dilatation. Appendix appears normal. Vascular/Lymphatic: No abdominal aorta or iliac aneurysm. Mild atherosclerotic plaque of the aorta and its branches. No abdominal, pelvic, or inguinal  lymphadenopathy. Reproductive: Uterus and bilateral adnexa are unremarkable. Other: No intraperitoneal free fluid. No intraperitoneal free gas. No organized fluid collection. Musculoskeletal: No abdominal wall hernia or abnormality. Redemonstration of indeterminate T9 mixed lytic and sclerotic osseous. No new suspicious lytic or blastic osseous lesions. No acute displaced fracture. Multilevel degenerative changes of the spine. IMPRESSION: 1. No acute intra-abdominal or intrapelvic abnormality. No intra-abdominal or intrapelvic metastatic findings. 2. Irregular interstitial and patchy airspace opacities of visualized lower lungs. Persistent moderate volume partially visualized left pleural effusion with question associate pleural thickening. Trace right pleural effusion. Findings better evaluated on CT angiography chest 07/29/2023. 3. Persistent indeterminate T9 mixed lytic and sclerotic osseous. Electronically Signed   By: Tish Frederickson M.D.   On: 07/30/2023 20:52   MR CERVICAL SPINE W WO CONTRAST  Result Date: 07/30/2023 CLINICAL DATA:  Abnormal CT. EXAM: MRI CERVICAL SPINE WITHOUT AND WITH CONTRAST TECHNIQUE: Multiplanar and multiecho pulse sequences of the cervical spine, to include the craniocervical junction and cervicothoracic junction, were obtained without and with intravenous contrast. CONTRAST:  7mL GADAVIST GADOBUTROL 1 MMOL/ML IV SOLN COMPARISON:  Cervical spine CT from yesterday FINDINGS: Alignment: Physiologic. Vertebrae: Infiltrating bone lesions seen at C2, C4, C5, C6, C7, T1, T2, and T3 vertebral bodies. Posterior element involvement at C4 and leftward at C5, C6. Known C4 pathologic fracture with advanced height loss. No extraosseous tumor infiltration or retropulsion. Cord: Normal signal and morphology Posterior Fossa, vertebral arteries, paraspinal tissues: No perispinal hematoma or spinal canal collection. Sizable left pleural effusion. Disc levels: No significant degenerative change.  IMPRESSION: Numerous bone lesions compatible with metastatic disease. Known pathologic compression fracture at C4 with advanced height loss but no retropulsion or soft tissue tumor extension. Electronically Signed   By: Tiburcio Pea M.D.   On: 07/30/2023 06:11   CT CERVICAL SPINE W CONTRAST  Addendum Date: 07/29/2023   ADDENDUM REPORT: 07/29/2023 19:01 ADDENDUM: Findings discussed with Dr. Kandace Blitz via telephone at 6:58 p.m. Electronically Signed   By: Feliberto Harts M.D.   On: 07/29/2023 19:01   Result Date: 07/29/2023 CLINICAL DATA:  Metastatic disease evaluation EXAM: CT CERVICAL SPINE WITH CONTRAST TECHNIQUE: Multidetector CT imaging of the cervical spine was performed during intravenous contrast administration. Multiplanar CT image reconstructions were also generated. RADIATION DOSE REDUCTION: This exam was performed according to the departmental dose-optimization program which includes automated exposure control, adjustment of the mA and/or kV according to patient size and/or use of iterative reconstruction technique. CONTRAST:  OMNIPAQUE IOHEXOL 350 MG/ML SOLN COMPARISON:  None Available. FINDINGS: Motion limited study. Alignment: Bony retropulsion at C4 is detailed below. Otherwise, no substantial sagittal subluxation. Skull base and vertebrae: C4 compression fracture with severe (approximately 80%) height loss and approximately 2 mm of bony retropulsion. Irregular lucency and sclerosis associated with vertebral body. Soft tissues and spinal canal: No prevertebral fluid or swelling. No visible canal hematoma. Disc levels: Mild-to-moderate multilevel degenerative change. The above retropulsion likely result in mild canal stenosis at C4. Upper chest: Please  see same day CT of the chest for further evaluation. IMPRESSION: C4 compression fracture with severe (approximately 80%) height loss, 2 mm of bony retropulsion, and heterogeneous sclerosis/lucency. Findings are highly suspicious for a  pathologic fracture. MRI of the cervical spine with contrast could further assess this level as well as better assess for additional lesions if clinically warranted. Electronically Signed: By: Feliberto Harts M.D. On: 07/29/2023 18:30   CT Angio Chest PE W and/or Wo Contrast  Result Date: 07/29/2023 CLINICAL DATA:  Pulmonary embolus suspected with high probability. Shortness of breath. Bronchoscopy 2 weeks ago. EXAM: CT ANGIOGRAPHY CHEST WITH CONTRAST TECHNIQUE: Multidetector CT imaging of the chest was performed using the standard protocol during bolus administration of intravenous contrast. Multiplanar CT image reconstructions and MIPs were obtained to evaluate the vascular anatomy. RADIATION DOSE REDUCTION: This exam was performed according to the departmental dose-optimization program which includes automated exposure control, adjustment of the mA and/or kV according to patient size and/or use of iterative reconstruction technique. CONTRAST:  OMNIPAQUE IOHEXOL 350 MG/ML SOLN COMPARISON:  Chest radiograph 07/29/2023.  CT chest 07/11/2023 FINDINGS: Cardiovascular: There is good opacification of the central and segmental pulmonary arteries. No focal filling defects. No evidence of significant pulmonary embolus. Normal heart size. No pericardial effusions. Normal caliber thoracic aorta. No aortic dissection. Mediastinum/Nodes: Thyroid gland is unremarkable. Esophagus is decompressed. Mediastinal lymphadenopathy. Largest right paratracheal nodes measure 2.2 cm diameter. Similar appearance to prior study. This may indicate metastatic disease. Lungs/Pleura: Large left and small right pleural effusions, progressing since the prior CT study. Diffuse interstitial and patchy alveolar infiltrates throughout the lungs, also progressing. Changes are most likely to represent edema or multifocal pneumonia. Aspiration could also have this appearance. Underlying primary or metastatic disease could also be considered.  Upper Abdomen: Diffuse fatty infiltration of the liver. No focal lesions. Musculoskeletal: No chest wall abnormality. No acute or significant osseous findings. Review of the MIP images confirms the above findings. IMPRESSION: 1. No evidence of significant pulmonary embolus. 2. Persistent finding of mediastinal lymphadenopathy, unchanged. 3. Large left and small right pleural effusions are progressing since prior CT. 4. Diffuse interstitial and alveolar infiltrates throughout both lungs is also progressing. Changes may represent edema or multifocal pneumonia. Aspiration or underlying neoplasm could also be considered. Electronically Signed   By: Burman Nieves M.D.   On: 07/29/2023 18:40     Scheduled Meds:  enoxaparin (LOVENOX) injection  40 mg Subcutaneous Q24H   pantoprazole  40 mg Oral Daily   propranolol ER  80 mg Oral Daily   sodium chloride flush  3 mL Intravenous Q12H   venlafaxine XR  150 mg Oral Daily   Continuous Infusions:   LOS: 2 days    Time spent: 39 min  Alwyn Ren, MD  07/31/2023, 1:06 PM

## 2023-07-31 NOTE — Progress Notes (Signed)
Assisted CCM with bedside Thoracentesis. Consent signed and placed in chart prior to procedure. Time out preformed. Patient tolerated procedure well vitals stable throughout procedure. Assisted back in bed with call bell within reach.   Discussed with bedside RN, please call rapid for any questions or concerns.

## 2023-07-31 NOTE — Procedures (Signed)
Thoracentesis  Procedure Note  Sherri Schultz  [id number]  [birth year]  Date:07/31/23  Time:4:24 PM   Provider Performing:Juandavid Dallman W Mikey Bussing   Procedure: Thoracentesis with imaging guidance (51025)  Indication(s) Pleural Effusion  Consent Risks of the procedure as well as the alternatives and risks of each were explained to the patient and/or caregiver.  Consent for the procedure was obtained and is signed in the bedside chart  Anesthesia Topical only with 1% lidocaine    Time Out Verified patient identification, verified procedure, site/side was marked, verified correct patient position, special equipment/implants available, medications/allergies/relevant history reviewed, required imaging and test results available.   Sterile Technique Maximal sterile technique including full sterile barrier drape, hand hygiene, sterile gown, sterile gloves, mask, hair covering, sterile ultrasound probe cover (if used).  Procedure Description Ultrasound was used to identify appropriate pleural anatomy for placement and overlying skin marked.  Area of drainage cleaned and draped in sterile fashion. Lidocaine was used to anesthetize the skin and subcutaneous tissue.  1000 cc's of clear yellow appearing fluid was drained from the left pleural space. Catheter then removed and bandaid applied to site.   Complications/Tolerance None; patient tolerated the procedure well. Chest X-ray is ordered to confirm no post-procedural complication.   EBL Minimal   Specimen(s) Pleural fluid     Joneen Roach, AGACNP-BC [facility]  See Amion for personal pager PCCM on call pager [phone] until 7pm. Please call Elink 7p-7a. [phone]  07/31/2023 4:24 PM

## 2023-08-01 ENCOUNTER — Ambulatory Visit
Admission: RE | Admit: 2023-08-01 | Discharge: 2023-08-01 | Disposition: A | Discharge status: Home / Self Care | Payer: BC Managed Care – PPO | Source: Ambulatory Visit | Attending: Radiation Oncology | Admitting: Radiation Oncology

## 2023-08-01 ENCOUNTER — Other Ambulatory Visit: Payer: Self-pay

## 2023-08-01 ENCOUNTER — Ambulatory Visit
Admission: RE | Admit: 2023-08-01 | Discharge: 2023-08-01 | Disposition: A | Discharge status: Home / Self Care | Payer: BC Managed Care – PPO | Source: Ambulatory Visit | Attending: Radiation Oncology

## 2023-08-01 DIAGNOSIS — C7951 Secondary malignant neoplasm of bone: Secondary | ICD-10-CM | POA: Diagnosis not present

## 2023-08-01 DIAGNOSIS — J90 Pleural effusion, not elsewhere classified: Secondary | ICD-10-CM | POA: Diagnosis not present

## 2023-08-01 LAB — RAD ONC ARIA SESSION SUMMARY
Course Elapsed Days: 0
Course Elapsed Days: 0
Plan Fractions Treated to Date: 1
Plan Fractions Treated to Date: 2
Plan Prescribed Dose Per Fraction: 3 Gy
Plan Prescribed Dose Per Fraction: 3 Gy
Plan Total Fractions Prescribed: 10
Plan Total Fractions Prescribed: 10
Plan Total Prescribed Dose: 30 Gy
Plan Total Prescribed Dose: 30 Gy
Reference Point Dosage Given to Date: 3 Gy
Reference Point Dosage Given to Date: 6 Gy
Reference Point Session Dosage Given: 3 Gy
Reference Point Session Dosage Given: 3 Gy
Session Number: 1
Session Number: 2

## 2023-08-01 MED ORDER — HYDROMORPHONE HCL 1 MG/ML IJ SOLN
0.5000 mg | Freq: Once | INTRAMUSCULAR | Status: AC
  Administered 2023-08-01: 0.5 mg via INTRAVENOUS
  Filled 2023-08-01: qty 0.5

## 2023-08-01 NOTE — Plan of Care (Signed)

## 2023-08-01 NOTE — Progress Notes (Signed)
PROGRESS NOTE    Sherri Schultz  GNF:[id number] DOB: 05/26/1966 DOA: 07/29/2023 PCP: Armelia Penton Palau, FNP   Brief Narrative:  57 y.o. female with past medical history of obstructive sleep apnea, left breast cancer, right leg malignant melanoma, migraine, hypertension and anxiety was sent from urgent care with hypoxia.  Of note patient was recently seen at the primary care office on 07/09/2023 with exertional shortness of breath and bilateral neck and shoulder/chest pain with voice change for several weeks.  Chest x-ray was abnormal and was treated for GERD pneumonitis.  Follow-up chest CT showed midsternal lymphadenopathy with patchy bilateral infiltrates with left pleural effusion. HSP panel, fungal, and autoimmune labs negative except positive ANA.  Patient had planned EBUS for 08/05/2023 with Dr. Delton Coombes.  Underwent IR left thoracentesis on 07/18/23 which was positive for adenocarcinoma.  Patient does have history of left breast cancer (DCIS dx 11/2019) s/p bilateral lumpectomies, adjuvant radiation and maintained on tamoxifen since 05/2020 followed by Dr. Al Pimple.  Patient had stopped taking her tamoxifen one year ago.  Patient had initially  called PCCM office, she was sent to urgent care center, she was found to be hypoxic so she was sent to the ED for further workup.  In the ED, patient was hemodynamically stable except for hypoxia saturating 88% on room air.  Pulmonary was also consulted.  CT scan of the chest was done which was negative for pulmonary embolism but persistent mediastinal lymphadenopathy with a large left and small right pleural effusion progressive since previous CT scan with diffuse interstitial and alveolar infiltrate..  CT of the C-spine showed C4 compression fracture with severe height loss around 80% with 2 mm bony retropulsion highly suspicious for pathological fracture.  Patient was then admitted hospital for further evaluation and treatment.  Mri c spine-Numerous bone lesions compatible  with metastatic disease.  Known pathologic compression fracture at C4 with advanced height loss but no retropulsion or soft tissue tumor extension. MRI lumbar spine-Widespread bone metastases with all lumbar levels affected. No pathologic fracture. No epidural or extraosseous tumor identified.  Mild for age underlying lumbar spine degeneration with no spinal or foraminal stenosis. But asymmetric and degenerated facets at L4-L5. Asymmetric signal associated with the right facet could be degenerative and/or metastasis related. MRI thoracic spine- Widespread bone metastases with virtually all Thoracic levels affected. No pathologic fracture. No epidural or extraosseous tumor identified.Mild for age underlying thoracic spine degeneration. No spinal or foraminal stenosis.Abnormal Chest including left greater than right pleural effusions. 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  CT pulmonary embolism protocol without any PE.     COVID and respiratory viral panel negative.  Oxygen requirement improved to 2 L on saturating 96%. Status post 1 L thoracentesis left pleural space 07/31/2023.   Pulmonary following. 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  MRI of the C-spine showed numerous bony lesions compatible with metastatic disease with pathologic compression fracture of C4 with advanced height loss but no retropulsion or  soft tissue  tumor extension.  Spoke with neurosurgery for consultation in no surgical recommendations at this time advised cervical collar.  Plan for palliative radiation to cervical noted starting 9/6 Neurosurgery notes reviewed no plans for surgery.   Hypokalemia: Resolved  Anxiety disorder: Continue venlafaxine.   GERD: Continue PPI   History of DCIS of left breast: Patient was on tamoxifen which she stopped a year ago.  Patient used to follow Dr. Al Pimple.  No local recurrence.   Obstructive sleep apnea: Continue nocturnal CPAP  Estimated body mass index is 27.53 kg/m as calculated from the following:   Height as of this encounter: 5' 4.5" (1.638 m).   Weight as of this encounter: 73.9 kg.  DVT prophylaxis: LOVENOX Code Status:full Family Communication:none Disposition Plan:  Status is: Inpatient Remains inpatient appropriate because:acute hypoxia   Consultants: pccm, neurosurgery, radiation oncology, oncology  Procedures: None Antimicrobials: None  Subjective:  Resting in bed feels breathing is better and easier than yesterday Had 1 L of clear yellow fluid drained from the left pleural space Propanolol on hold  Blood pressure soft.  On morphine and got as needed Dilaudid for pain control Denies diarrhea or constipation Objective: Vitals:   07/31/23 1618 07/31/23 1804 07/31/23 2221 08/01/23 0604  BP: 93/72 105/65 (!) 102/59 107/64  Pulse:  81 85 95  Resp:  18 18 18   Temp:  98 F (36.7 C) 99 F (37.2 C) 98.6 F (37 C)  TempSrc:  Oral Oral Oral  SpO2: 97% 92% 93% 96%  Weight:      Height:        Intake/Output Summary (Last 24 hours) at 08/01/2023 0847 Last data filed at 07/31/2023 2136 Gross per 24 hour  Intake 3 ml  Output --  Net 3 ml   Filed Weights   07/30/23 0352 07/31/23 0449 07/31/23 1030  Weight: 73.7 kg 74.5 kg 73.9 kg    Examination:  General exam: Appears in no acute distress Respiratory system: Diminished breath sounds to auscultation. Respiratory effort  normal. Cardiovascular system: S1 & S2 heard, RRR. No JVD, murmurs, rubs, gallops or clicks. No pedal edema. Gastrointestinal system: Abdomen is nondistended, soft and nontender. No organomegaly or masses felt. Normal bowel sounds heard. Central nervous system: Alert and oriented.  Extremities: Symmetric 5 x 5 power.  Data Reviewed: I have personally reviewed following labs and imaging studies  CBC: Recent Labs  Lab 07/29/23 1349 07/29/23 1547 07/30/23 0910 07/31/23 0832  WBC 9.7 11.5* 9.1 9.1  HGB 12.9 14.1 12.4 13.0  HCT 39.9 44.0 38.1 39.8  MCV 93.0 96.1 96.0 94.5  PLT 418* 356 352 374   Basic Metabolic Panel: Recent Labs  Lab 07/29/23 1349 07/29/23 1547 07/30/23 0910 07/31/23 0832  NA 137  --  135 137  K 3.4*  --  3.6 3.7  CL 98  --  105 101  CO2 26  --  20* 26  GLUCOSE 92  --  111* 96  BUN 7  --  5* <5*  CREATININE 0.72 0.77 0.66 0.76  CALCIUM 9.3  --  8.7* 9.1  MG  --  1.9  --  2.0   GFR: Estimated Creatinine Clearance: 78.2 mL/min (by C-G formula based on SCr of 0.76 mg/dL). Liver Function Tests: Recent Labs  Lab 07/29/23 1349 07/30/23 0910  AST 27 23  ALT 15 12  ALKPHOS 92 79  BILITOT 0.6 0.2*  PROT 7.1 6.3*  ALBUMIN 3.2* 2.9*   No results for input(s): "LIPASE", "AMYLASE"  in the last 168 hours. No results for input(s): "AMMONIA" in the last 168 hours. Coagulation Profile: No results for input(s): "INR", "PROTIME" in the last 168 hours. Cardiac Enzymes: No results for input(s): "CKTOTAL", "CKMB", "CKMBINDEX", "TROPONINI" in the last 168 hours. BNP (last 3 results) No results for input(s): "PROBNP" in the last 8760 hours. HbA1C: No results for input(s): "HGBA1C" in the last 72 hours. CBG: No results for input(s): "GLUCAP" in the last 168 hours. Lipid Profile: No results for input(s): "CHOL", "HDL", "LDLCALC", "TRIG", "CHOLHDL", "LDLDIRECT" in the last 72 hours. Thyroid Function Tests: Recent Labs    07/29/23 1549  TSH 1.725   Anemia  Panel: No results for input(s): "VITAMINB12", "FOLATE", "FERRITIN", "TIBC", "IRON", "RETICCTPCT" in the last 72 hours. Sepsis Labs: No results for input(s): "PROCALCITON", "LATICACIDVEN" in the last 168 hours.  Recent Results (from the past 240 hour(s))  SARS Coronavirus 2 by RT PCR (hospital order, performed in [facility] hospital lab) *cepheid single result test* Anterior Nasal Swab     Status: None   Collection Time: 07/29/23  3:45 PM   Specimen: Anterior Nasal Swab  Result Value Ref Range Status   SARS Coronavirus 2 by RT PCR NEGATIVE NEGATIVE Final    Comment: Performed at [facility], [address], [place], [place] [address]  Respiratory (~20 pathogens) panel by PCR     Status: None   Collection Time: 07/29/23  3:46 PM   Specimen: Nasopharyngeal Swab; Respiratory  Result Value Ref Range Status   Adenovirus NOT DETECTED NOT DETECTED Final   Coronavirus 229E NOT DETECTED NOT DETECTED Final    Comment: (NOTE) The Coronavirus on the Respiratory Panel, DOES NOT test for the novel  Coronavirus (2019 nCoV)    Coronavirus HKU1 NOT DETECTED NOT DETECTED Final   Coronavirus NL63 NOT DETECTED NOT DETECTED Final   Coronavirus OC43 NOT DETECTED NOT DETECTED Final   Metapneumovirus NOT DETECTED NOT DETECTED Final   Rhinovirus / Enterovirus NOT DETECTED NOT DETECTED Final   Influenza A NOT DETECTED NOT DETECTED Final   Influenza B NOT DETECTED NOT DETECTED Final   Parainfluenza Virus 1 NOT DETECTED NOT DETECTED Final   Parainfluenza Virus 2 NOT DETECTED NOT DETECTED Final   Parainfluenza Virus 3 NOT DETECTED NOT DETECTED Final   Parainfluenza Virus 4 NOT DETECTED NOT DETECTED Final   Respiratory Syncytial Virus NOT DETECTED NOT DETECTED Final   Bordetella pertussis NOT DETECTED NOT DETECTED Final   Bordetella Parapertussis NOT DETECTED NOT DETECTED Final   Chlamydophila pneumoniae NOT DETECTED NOT DETECTED Final   Mycoplasma pneumoniae NOT DETECTED NOT DETECTED Final     Comment: Performed at [facility], [address], [place], [place] [address]         Radiology Studies: DG Chest Port 1 View  Result Date: 07/31/2023 CLINICAL DATA:  [id number] S/P thoracentesis [id number] EXAM: PORTABLE CHEST - 1 VIEW COMPARISON:  07/29/2023 FINDINGS: Some improvement in left upper lung and perihilar airspace opacities. Coarse diffuse interstitial and alveolar opacities throughout both lungs persist. Heart size and mediastinal contours are within normal limits. Persistent left lateral pleural thickening or effusion. Visualized bones unremarkable.  Surgical clips left upper abdomen. IMPRESSION: Improved left upper lung and perihilar airspace opacities. No pneumothorax. Electronically Signed   By: Corlis Leak M.D.   On: 07/31/2023 18:30   MR Lumbar Spine W Wo Contrast  Result Date: 07/31/2023 CLINICAL DATA:  57 year old female with osseous metastatic disease. History of breast cancer. EXAM: MRI LUMBAR SPINE  WITHOUT AND WITH CONTRAST TECHNIQUE: Multiplanar and multiecho pulse sequences of the lumbar spine were obtained without and with intravenous contrast. CONTRAST:  7mL GADAVIST GADOBUTROL 1 MMOL/ML IV SOLN COMPARISON:  Thoracic MRI today reported separately. CT Abdomen and Pelvis yesterday. FINDINGS: Segmentation:  Normal, concordant with the thoracic numbering today. Alignment:  Normal lumbar lordosis. Vertebrae: Widespread osseous metastatic disease with all lumbar levels affected. Partially visible sacral and pelvic T1 hypointense and enhancing bone metastases also. No lumbar compression fracture. Conus medullaris and cauda equina: Conus extends to the T12 level. No lower spinal cord or conus signal abnormality. Normal cauda equina nerve roots. No abnormal intradural enhancement or dural thickening identified on mildly motion degraded sagittal and moderately motion degraded axial postcontrast imaging. Paraspinal and other soft tissues: Stable visible abdominal viscera. No lumbar  epidural or extraosseous tumor identified at this time. Disc levels: Generally mild for age underlying lumbar spine degeneration, although asymmetric facet degeneration at L4-L5 greater on the right. Small facet joint fluid there (series four image 31). Unclear to what extent superimposed facet metastases could be contributing to the facet asymmetry here. But there is no associated spinal or foraminal stenosis. IMPRESSION: 1. Widespread bone metastases with all lumbar levels affected. No pathologic fracture. No epidural or extraosseous tumor identified. 2. Mild for age underlying lumbar spine degeneration with no spinal or foraminal stenosis. But asymmetric and degenerated facets at L4-L5. Asymmetric signal associated with the right facet could be degenerative and/or metastasis related. Electronically Signed   By: Odessa Fleming M.D.   On: 07/31/2023 08:20   MR THORACIC SPINE W WO CONTRAST  Result Date: 07/31/2023 CLINICAL DATA:  57 year old female with osseous metastatic disease. History of breast cancer. EXAM: MRI THORACIC WITHOUT AND WITH CONTRAST TECHNIQUE: Multiplanar and multiecho pulse sequences of the thoracic spine were obtained without and with intravenous contrast. CONTRAST:  7mL GADAVIST GADOBUTROL 1 MMOL/ML IV SOLN COMPARISON:  Cervical spine MRI yesterday. CTA chest 07/29/2023. CT Abdomen and Pelvis yesterday. FINDINGS: Limited cervical spine imaging:  Stable since yesterday. Thoracic spine segmentation: Normal on the comparison CTs, small ribs at T12. Alignment: Stable thoracic kyphosis. No significant spondylolisthesis. Vertebrae: T1 hypointense and enhancing bone metastases at all thoracic levels. Widely scattered vertebral body and intermittent posterior element involvement. The T9 vertebral body is 1 of the least affected by MRI, suggesting prominent sclerosis there by CT could be treated metastasis. But there is right T9 posterior element involvement. No pathologic compression fracture at this time.  Small T11 inferior endplate deformity which is more likely a Schmorl's node. Cord: Within normal limits. No spinal cord edema. No abnormal intradural enhancement. Conus medullaris appears normal. Paraspinal and other soft tissues: Abnormal chest including moderate to large left pleural effusion which appears to be layering. More complex appearing but smaller right pleural effusion. Some thoracic epidural lipomatosis (benign). No epidural or extraosseous tumor extension identified at this time. Disc levels: Mild for age thoracic spine degeneration. No thoracic spinal stenosis. No significant foraminal stenosis. IMPRESSION: 1. Widespread bone metastases with virtually all Thoracic levels affected. No pathologic fracture. No epidural or extraosseous tumor identified. 2. Mild for age underlying thoracic spine degeneration. No spinal or foraminal stenosis. 3. Abnormal Chest including left greater than right pleural effusions. Electronically Signed   By: Odessa Fleming M.D.   On: 07/31/2023 08:15   CT ABDOMEN PELVIS W CONTRAST  Result Date: 07/30/2023 CLINICAL DATA:  Metastatic disease evaluation lung mass, bone mets EXAM: CT ABDOMEN AND PELVIS WITH CONTRAST TECHNIQUE: Multidetector CT imaging  of the abdomen and pelvis was performed using the standard protocol following bolus administration of intravenous contrast. RADIATION DOSE REDUCTION: This exam was performed according to the departmental dose-optimization program which includes automated exposure control, adjustment of the mA and/or kV according to patient size and/or use of iterative reconstruction technique. CONTRAST:  75mL OMNIPAQUE IOHEXOL 350 MG/ML SOLN COMPARISON:  CT angiography chest 07/29/2023, CT angiography chest 07/11/2023 FINDINGS: Lower chest: Irregular interstitial and patchy airspace opacities of visualized lower lungs again noted. Persistent moderate volume partially visualized left pleural effusion with question associate pleural thickening. Trace  right pleural effusion. Hepatobiliary: No focal liver abnormality. No gallstones, gallbladder wall thickening, or pericholecystic fluid. No biliary dilatation. Pancreas: No focal lesion. Normal pancreatic contour. No surrounding inflammatory changes. No main pancreatic ductal dilatation. Spleen: Normal in size without focal abnormality. Adrenals/Urinary Tract: No adrenal nodule bilaterally. Bilateral kidneys enhance symmetrically. No hydronephrosis. No hydroureter. The urinary bladder is unremarkable. Stomach/Bowel: PO contrast reaches colon. Stomach is within normal limits. No evidence of bowel wall thickening or dilatation. Appendix appears normal. Vascular/Lymphatic: No abdominal aorta or iliac aneurysm. Mild atherosclerotic plaque of the aorta and its branches. No abdominal, pelvic, or inguinal lymphadenopathy. Reproductive: Uterus and bilateral adnexa are unremarkable. Other: No intraperitoneal free fluid. No intraperitoneal free gas. No organized fluid collection. Musculoskeletal: No abdominal wall hernia or abnormality. Redemonstration of indeterminate T9 mixed lytic and sclerotic osseous. No new suspicious lytic or blastic osseous lesions. No acute displaced fracture. Multilevel degenerative changes of the spine. IMPRESSION: 1. No acute intra-abdominal or intrapelvic abnormality. No intra-abdominal or intrapelvic metastatic findings. 2. Irregular interstitial and patchy airspace opacities of visualized lower lungs. Persistent moderate volume partially visualized left pleural effusion with question associate pleural thickening. Trace right pleural effusion. Findings better evaluated on CT angiography chest 07/29/2023. 3. Persistent indeterminate T9 mixed lytic and sclerotic osseous. Electronically Signed   By: Tish Frederickson M.D.   On: 07/30/2023 20:52     Scheduled Meds:  enoxaparin (LOVENOX) injection  40 mg Subcutaneous Q24H   pantoprazole  40 mg Oral Daily   propranolol ER  80 mg Oral Daily    sodium chloride flush  3 mL Intravenous Q12H   venlafaxine XR  150 mg Oral Daily   Continuous Infusions:   LOS: 3 days    Time spent: 39 min  Alwyn Ren, MD  08/01/2023, 8:47 AM

## 2023-08-01 NOTE — H&P (View-Only) (Signed)
She  NAME:  Sherri Schultz, MRN:  [id number], DOB:  Jul 01, 1966, LOS: 3 ADMISSION DATE:  07/29/2023, CONSULTATION DATE:  07/31/2023 REFERRING MD:  Trisha Mangle, PA , CHIEF COMPLAINT:  SOB, hypoxia, Pleural effusion   History of Present Illness:  89 yoF with PMH of never smoker, OSA (CPAP ordered), left breast CA, migraines, melanoma (right leg), HTN, and anxiety who was sent from UC after being found hypoxic in the mid 80's on room with progressive SOB.  Also reports ongoing bilateral shoulder and neck pain with new radiation into right arm down to elbow without weakness or numbness since yesterday.  States its hard to lift her head off the bed at times.     Seen by Dr. Delton Coombes in our office 07/09/2023 after several weeks of exertional SOB with bilateral shoulder, neck and intermittent chest discomfort and voice change.  Pulmonary workup initiated, found to have abnormal CXR, empirically treated for possible GERD/ pneumonitis.  Follow-up chest CT which showed mediastinal adenopathy with patchy bilateral infiltrates with left pleural effusion.  HSP panel, fungal, and autoimmune labs negative except positive ANA.  Planned EBUS for 08/17/2023 with Dr. Delton Coombes.  Underwent IR left thoracentesis on 8/23 yielding 600 ml of yellow fluid; pleural labs consistent with lymphocytic exudate by Ligh reveal stopped therapy.  Prolonged follow-up with today significant t's criteria, however cytology resulted on 8/30 positive for adenocarcinoma.     Of note, history of left breast cancer (DCIS dx 11/2019) s/p bilateral lumpectomies, adjuvant radiation and maintained on tamoxifen since 05/2020 followed by Dr. Al Pimple.  Patient reports she stopped taking her tamoxifen one year ago; oncology notes do not reflect this. Denies any recent fever, chills, productive cough/ hemoptysis, falls, LE edema/ pain/ swelling.  Has chronic low back pain.    Afebrile, normotensive and requiring 3L [facility] without respiratory distress in ER.  Labs pending.  CXR  showing progressive bilateral infiltrates, more extensive in left.  Pulmonary consulted for further recommendations, likely to be admitted to [facility]. Hey yeah sure you have a great thank you yeah I would  Pertinent  Medical History  Never smoker, OSA, left breast cancer, migraines, melanoma right leg, hypertension, anxiety  Significant Hospital Events: Including procedures, antibiotic start and stop dates in addition to other pertinent events   Left pleural fluid 8/23 >> CK7 positive adenocarcinoma (unclear lineage) CT-PA 9/3 >> no pulmonary embolism, persistent stable mediastinal adenopathy right paratracheal region, left hilar region.  Large left and small right pleural effusions (increased).  Progressive bilateral scattered groundglass/nodular infiltrates. CT cervical spine 9/3 >> bony retropulsion at C4 associated with severe compression fracture and vertebral body lucency concerning for pathologic fracture MRI C-spine 9/4 >> infiltrating bony lesions noted at C2, C4, C5, C6, C7, T1, T2, T3 bodies.  The posterior element at C4 involved with associated fracture.  No extraosseous tumor infiltration or retropulsion noted. MRI T-spine, L-spine 9/5 >> multiple infiltrating bony lesions without any tumor protrusion or impact on the cord.  Interim History / Subjective:  No overnight events  Objective   Blood pressure 107/64, pulse 95, temperature 98.6 F (37 C), temperature source Oral, resp. rate 18, height 5' 4.5" (1.638 m), weight 73.9 kg, last menstrual period 03/27/2011, SpO2 96%.        Intake/Output Summary (Last 24 hours) at 08/01/2023 1114 Last data filed at 07/31/2023 2136 Gross per 24 hour  Intake 3 ml  Output --  Net 3 ml   Filed Weights   07/30/23 0352 07/31/23 0449 07/31/23 1030  Weight: 73.7 kg 74.5 kg 73.9 kg    Examination: General: Middle-age, does not appear to be in distress HENT: Moist oral mucosa Lungs: Good air entry bilaterally Cardiovascular: S1-S2  appreciated Abdomen: Soft, bowel sounds appreciated Extremities: No clubbing, no edema Neuro: Alert and oriented x 3 GU:   Status postthoracentesis for 1 L of fluid 9/5  Pleural fluid -Exudative -Cytology pending, previous fluid consistent with adenocarcinoma  CT chest 9 3 reviewed showing mediastinal adenopathy  Resolved Hospital Problem list     Assessment & Plan:  Patient with metastatic adenocarcinoma, positive pleural fluid recently on 8/23  Recurrent pleural effusion for which she had a thoracentesis  Discussion already had with patient regarding the Pleurx catheter placement  She has had no fevers or chills, not on antibiotics at present  Concern for primary lung cancer for which she will be having a bronchoscopy performed on 9/9 scheduled for EBUS yeah She has been followed by oncology, radiation oncology  Best Practice (right click and "Reselect all SmartList Selections" daily)   Diet/type: Renaye Rakers consistency (see orders) DVT prophylaxis: SCD GI prophylaxis: N/A Lines: N/A Foley:  N/A Code Status:  full code Last date of multidisciplinary goals of care discussion not sure her dad we will cholecystitis time  Labs   CBC: Recent Labs  Lab 07/29/23 1349 07/29/23 1547 07/30/23 0910 07/31/23 0832  WBC 9.7 11.5* 9.1 9.1  HGB 12.9 14.1 12.4 13.0  HCT 39.9 44.0 38.1 39.8  MCV 93.0 96.1 96.0 94.5  PLT 418* 356 352 374    Basic Metabolic Panel: Recent Labs  Lab 07/29/23 1349 07/29/23 1547 07/30/23 0910 07/31/23 0832  NA 137  --  135 137  K 3.4*  --  3.6 3.7  CL 98  --  105 101  CO2 26  --  20* 26  GLUCOSE 92  --  111* 96  BUN 7  --  5* <5*  CREATININE 0.72 0.77 0.66 0.76  CALCIUM 9.3  --  8.7* 9.1  MG  --  1.9  --  2.0   GFR: Estimated Creatinine Clearance: 78.2 mL/min (by C-G formula based on SCr of 0.76 mg/dL). Recent Labs  Lab 07/29/23 1349 07/29/23 1547 07/30/23 0910 07/31/23 0832  WBC 9.7 11.5* 9.1 9.1    Liver Function  Tests: Recent Labs  Lab 07/29/23 1349 07/30/23 0910  AST 27 23  ALT 15 12  ALKPHOS 92 79  BILITOT 0.6 0.2*  PROT 7.1 6.3*  ALBUMIN 3.2* 2.9*   No results for input(s): "LIPASE", "AMYLASE" in the last 168 hours. No results for input(s): "AMMONIA" in the last 168 hours.  ABG No results found for: "PHART", "PCO2ART", "PO2ART", "HCO3", "TCO2", "ACIDBASEDEF", "O2SAT"   Coagulation Profile: No results for input(s): "INR", "PROTIME" in the last 168 hours.  Cardiac Enzymes: No results for input(s): "CKTOTAL", "CKMB", "CKMBINDEX", "TROPONINI" in the last 168 hours.  HbA1C: No results found for: "HGBA1C"  CBG: No results for input(s): "GLUCAP" in the last 168 hours.  Review of Systems:   Denies any pain or discomfort at present  Past Medical History:  She,  has a past medical history of Anxiety, Breast cancer (HCC) (2021), Cancer (HCC) (2013), Family history of brain cancer, Family history of breast cancer, Family history of colon cancer, Family history of melanoma, Hypertension, Personal history of radiation therapy, and Trimalleolar fracture of ankle, closed, left, initial encounter.   Surgical History:   Past Surgical History:  Procedure Laterality Date   BREAST BIOPSY Right 10+  yrs ago   benign   BREAST BIOPSY Right 01/21/2020   x2   BREAST BIOPSY Left 12/14/2019   x2   BREAST CYST ASPIRATION Right 03/12/2021   BREAST EXCISIONAL BIOPSY Right 02/15/2020   BREAST LUMPECTOMY Left 02/15/2020   BREAST LUMPECTOMY WITH RADIOACTIVE SEED LOCALIZATION Left 02/15/2020   Procedure: LEFT BREAST LUMPECTOMY WITH RADIOACTIVE SEED LOCALIZATION;  Surgeon: Almond Lint, MD;  Location: [facility];  Service: General;  Laterality: Left;   MELANOMA EXCISION Right 2013   knee   ORIF ANKLE FRACTURE Left 05/06/2018   Procedure: OPEN REDUCTION INTERNAL FIXATION (ORIF) LEFT TRIMALLEOLAR ANKLE FRACTURE;  Surgeon: Tarry Kos, MD;  Location: [facility];  Service:  Orthopedics;  Laterality: Left;   RADIOACTIVE SEED GUIDED EXCISIONAL BREAST BIOPSY Right 02/15/2020   Procedure: RIGHT BREAST RADIOACTIVE SEED LOCALIZATION EXCISIONAL BIOPSY X 2;  Surgeon: Almond Lint, MD;  Location: [facility];  Service: General;  Laterality: Right;     Social History:   reports that she has never smoked. She has never used smokeless tobacco. She reports that she does not drink alcohol and does not use drugs.   Family History:  Her family history includes Arthritis in her father; Brain cancer in her maternal uncle; Breast cancer in her cousin, cousin, maternal aunt, maternal aunt, and maternal aunt; Breast cancer (age of onset: 104) in her mother; Cancer in her maternal uncle; Colon cancer (age of onset: 34) in her mother; Diabetes in her brother, brother, father, and mother; Heart attack in her father; Hypertension in her father; Melanoma (age of onset: 62) in her brother.   Allergies No Known Allergies    Virl Diamond, MD [facility] Pager: See Loretha Stapler

## 2023-08-01 NOTE — Progress Notes (Signed)
She  NAME:  Sherri Schultz, MRN:  [id number], DOB:  Jul 01, 1966, LOS: 3 ADMISSION DATE:  07/29/2023, CONSULTATION DATE:  07/31/2023 REFERRING MD:  Trisha Mangle, PA , CHIEF COMPLAINT:  SOB, hypoxia, Pleural effusion   History of Present Illness:  89 yoF with PMH of never smoker, OSA (CPAP ordered), left breast CA, migraines, melanoma (right leg), HTN, and anxiety who was sent from UC after being found hypoxic in the mid 80's on room with progressive SOB.  Also reports ongoing bilateral shoulder and neck pain with new radiation into right arm down to elbow without weakness or numbness since yesterday.  States its hard to lift her head off the bed at times.     Seen by Dr. Delton Coombes in our office 07/09/2023 after several weeks of exertional SOB with bilateral shoulder, neck and intermittent chest discomfort and voice change.  Pulmonary workup initiated, found to have abnormal CXR, empirically treated for possible GERD/ pneumonitis.  Follow-up chest CT which showed mediastinal adenopathy with patchy bilateral infiltrates with left pleural effusion.  HSP panel, fungal, and autoimmune labs negative except positive ANA.  Planned EBUS for 08/17/2023 with Dr. Delton Coombes.  Underwent IR left thoracentesis on 8/23 yielding 600 ml of yellow fluid; pleural labs consistent with lymphocytic exudate by Ligh reveal stopped therapy.  Prolonged follow-up with today significant t's criteria, however cytology resulted on 8/30 positive for adenocarcinoma.     Of note, history of left breast cancer (DCIS dx 11/2019) s/p bilateral lumpectomies, adjuvant radiation and maintained on tamoxifen since 05/2020 followed by Dr. Al Pimple.  Patient reports she stopped taking her tamoxifen one year ago; oncology notes do not reflect this. Denies any recent fever, chills, productive cough/ hemoptysis, falls, LE edema/ pain/ swelling.  Has chronic low back pain.    Afebrile, normotensive and requiring 3L [facility] without respiratory distress in ER.  Labs pending.  CXR  showing progressive bilateral infiltrates, more extensive in left.  Pulmonary consulted for further recommendations, likely to be admitted to [facility]. Hey yeah sure you have a great thank you yeah I would  Pertinent  Medical History  Never smoker, OSA, left breast cancer, migraines, melanoma right leg, hypertension, anxiety  Significant Hospital Events: Including procedures, antibiotic start and stop dates in addition to other pertinent events   Left pleural fluid 8/23 >> CK7 positive adenocarcinoma (unclear lineage) CT-PA 9/3 >> no pulmonary embolism, persistent stable mediastinal adenopathy right paratracheal region, left hilar region.  Large left and small right pleural effusions (increased).  Progressive bilateral scattered groundglass/nodular infiltrates. CT cervical spine 9/3 >> bony retropulsion at C4 associated with severe compression fracture and vertebral body lucency concerning for pathologic fracture MRI C-spine 9/4 >> infiltrating bony lesions noted at C2, C4, C5, C6, C7, T1, T2, T3 bodies.  The posterior element at C4 involved with associated fracture.  No extraosseous tumor infiltration or retropulsion noted. MRI T-spine, L-spine 9/5 >> multiple infiltrating bony lesions without any tumor protrusion or impact on the cord.  Interim History / Subjective:  No overnight events  Objective   Blood pressure 107/64, pulse 95, temperature 98.6 F (37 C), temperature source Oral, resp. rate 18, height 5' 4.5" (1.638 m), weight 73.9 kg, last menstrual period 03/27/2011, SpO2 96%.        Intake/Output Summary (Last 24 hours) at 08/01/2023 1114 Last data filed at 07/31/2023 2136 Gross per 24 hour  Intake 3 ml  Output --  Net 3 ml   Filed Weights   07/30/23 0352 07/31/23 0449 07/31/23 1030  Weight: 73.7 kg 74.5 kg 73.9 kg    Examination: General: Middle-age, does not appear to be in distress HENT: Moist oral mucosa Lungs: Good air entry bilaterally Cardiovascular: S1-S2  appreciated Abdomen: Soft, bowel sounds appreciated Extremities: No clubbing, no edema Neuro: Alert and oriented x 3 GU:   Status postthoracentesis for 1 L of fluid 9/5  Pleural fluid -Exudative -Cytology pending, previous fluid consistent with adenocarcinoma  CT chest 9 3 reviewed showing mediastinal adenopathy  Resolved Hospital Problem list     Assessment & Plan:  Patient with metastatic adenocarcinoma, positive pleural fluid recently on 8/23  Recurrent pleural effusion for which she had a thoracentesis  Discussion already had with patient regarding the Pleurx catheter placement  She has had no fevers or chills, not on antibiotics at present  Concern for primary lung cancer for which she will be having a bronchoscopy performed on 9/9 scheduled for EBUS yeah She has been followed by oncology, radiation oncology  Best Practice (right click and "Reselect all SmartList Selections" daily)   Diet/type: Renaye Rakers consistency (see orders) DVT prophylaxis: SCD GI prophylaxis: N/A Lines: N/A Foley:  N/A Code Status:  full code Last date of multidisciplinary goals of care discussion not sure her dad we will cholecystitis time  Labs   CBC: Recent Labs  Lab 07/29/23 1349 07/29/23 1547 07/30/23 0910 07/31/23 0832  WBC 9.7 11.5* 9.1 9.1  HGB 12.9 14.1 12.4 13.0  HCT 39.9 44.0 38.1 39.8  MCV 93.0 96.1 96.0 94.5  PLT 418* 356 352 374    Basic Metabolic Panel: Recent Labs  Lab 07/29/23 1349 07/29/23 1547 07/30/23 0910 07/31/23 0832  NA 137  --  135 137  K 3.4*  --  3.6 3.7  CL 98  --  105 101  CO2 26  --  20* 26  GLUCOSE 92  --  111* 96  BUN 7  --  5* <5*  CREATININE 0.72 0.77 0.66 0.76  CALCIUM 9.3  --  8.7* 9.1  MG  --  1.9  --  2.0   GFR: Estimated Creatinine Clearance: 78.2 mL/min (by C-G formula based on SCr of 0.76 mg/dL). Recent Labs  Lab 07/29/23 1349 07/29/23 1547 07/30/23 0910 07/31/23 0832  WBC 9.7 11.5* 9.1 9.1    Liver Function  Tests: Recent Labs  Lab 07/29/23 1349 07/30/23 0910  AST 27 23  ALT 15 12  ALKPHOS 92 79  BILITOT 0.6 0.2*  PROT 7.1 6.3*  ALBUMIN 3.2* 2.9*   No results for input(s): "LIPASE", "AMYLASE" in the last 168 hours. No results for input(s): "AMMONIA" in the last 168 hours.  ABG No results found for: "PHART", "PCO2ART", "PO2ART", "HCO3", "TCO2", "ACIDBASEDEF", "O2SAT"   Coagulation Profile: No results for input(s): "INR", "PROTIME" in the last 168 hours.  Cardiac Enzymes: No results for input(s): "CKTOTAL", "CKMB", "CKMBINDEX", "TROPONINI" in the last 168 hours.  HbA1C: No results found for: "HGBA1C"  CBG: No results for input(s): "GLUCAP" in the last 168 hours.  Review of Systems:   Denies any pain or discomfort at present  Past Medical History:  She,  has a past medical history of Anxiety, Breast cancer (HCC) (2021), Cancer (HCC) (2013), Family history of brain cancer, Family history of breast cancer, Family history of colon cancer, Family history of melanoma, Hypertension, Personal history of radiation therapy, and Trimalleolar fracture of ankle, closed, left, initial encounter.   Surgical History:   Past Surgical History:  Procedure Laterality Date   BREAST BIOPSY Right 10+  yrs ago   benign   BREAST BIOPSY Right 01/21/2020   x2   BREAST BIOPSY Left 12/14/2019   x2   BREAST CYST ASPIRATION Right 03/12/2021   BREAST EXCISIONAL BIOPSY Right 02/15/2020   BREAST LUMPECTOMY Left 02/15/2020   BREAST LUMPECTOMY WITH RADIOACTIVE SEED LOCALIZATION Left 02/15/2020   Procedure: LEFT BREAST LUMPECTOMY WITH RADIOACTIVE SEED LOCALIZATION;  Surgeon: Almond Lint, MD;  Location: [facility];  Service: General;  Laterality: Left;   MELANOMA EXCISION Right 2013   knee   ORIF ANKLE FRACTURE Left 05/06/2018   Procedure: OPEN REDUCTION INTERNAL FIXATION (ORIF) LEFT TRIMALLEOLAR ANKLE FRACTURE;  Surgeon: Tarry Kos, MD;  Location: [facility];  Service:  Orthopedics;  Laterality: Left;   RADIOACTIVE SEED GUIDED EXCISIONAL BREAST BIOPSY Right 02/15/2020   Procedure: RIGHT BREAST RADIOACTIVE SEED LOCALIZATION EXCISIONAL BIOPSY X 2;  Surgeon: Almond Lint, MD;  Location: [facility];  Service: General;  Laterality: Right;     Social History:   reports that she has never smoked. She has never used smokeless tobacco. She reports that she does not drink alcohol and does not use drugs.   Family History:  Her family history includes Arthritis in her father; Brain cancer in her maternal uncle; Breast cancer in her cousin, cousin, maternal aunt, maternal aunt, and maternal aunt; Breast cancer (age of onset: 104) in her mother; Cancer in her maternal uncle; Colon cancer (age of onset: 34) in her mother; Diabetes in her brother, brother, father, and mother; Heart attack in her father; Hypertension in her father; Melanoma (age of onset: 62) in her brother.   Allergies No Known Allergies    Virl Diamond, MD [facility] Pager: See Loretha Stapler

## 2023-08-01 NOTE — Progress Notes (Signed)
   08/01/23 2200  BiPAP/CPAP/SIPAP  BiPAP/CPAP/SIPAP Pt Type Adult  Reason BIPAP/CPAP not in use Non-compliant (patient states that she cant tolerate anything on her face. RT encouraged that if she changed her mind to inform us.)

## 2023-08-01 NOTE — Progress Notes (Signed)
Pulmonary critical care  Patient's bronchoscopy is scheduled for 08/04/2023 at 1330  Needs to show up at [facility] endoscopy at 1100, n.p.o. after midnight   Sherri Pupa, MD, PhD 08/01/2023, 2:17 PM [facility] [phone] or if no answer before 7:00PM call [phone] For any issues after 7:00PM please call eLink [phone]

## 2023-08-02 ENCOUNTER — Inpatient Hospital Stay (HOSPITAL_COMMUNITY): Payer: BC Managed Care – PPO

## 2023-08-02 DIAGNOSIS — C7951 Secondary malignant neoplasm of bone: Secondary | ICD-10-CM | POA: Diagnosis not present

## 2023-08-02 MED ORDER — BISACODYL 10 MG RE SUPP
10.0000 mg | Freq: Once | RECTAL | Status: DC
  Filled 2023-08-02: qty 1

## 2023-08-02 MED ORDER — SENNOSIDES-DOCUSATE SODIUM 8.6-50 MG PO TABS
2.0000 | ORAL_TABLET | Freq: Two times a day (BID) | ORAL | Status: DC
  Administered 2023-08-02 – 2023-08-03 (×3): 2 via ORAL
  Filled 2023-08-02 (×3): qty 2

## 2023-08-02 MED ORDER — OXYCODONE HCL 5 MG PO TABS
10.0000 mg | ORAL_TABLET | ORAL | Status: DC | PRN
  Administered 2023-08-02 – 2023-08-03 (×3): 10 mg via ORAL
  Filled 2023-08-02 (×3): qty 2

## 2023-08-02 MED ORDER — DEXAMETHASONE 4 MG PO TABS
4.0000 mg | ORAL_TABLET | Freq: Two times a day (BID) | ORAL | Status: DC
  Filled 2023-08-02: qty 1

## 2023-08-02 MED ORDER — OXYCODONE HCL ER 10 MG PO T12A
10.0000 mg | EXTENDED_RELEASE_TABLET | Freq: Two times a day (BID) | ORAL | Status: DC
  Administered 2023-08-02 – 2023-08-03 (×3): 10 mg via ORAL
  Filled 2023-08-02 (×3): qty 1

## 2023-08-02 MED ORDER — POLYETHYLENE GLYCOL 3350 17 G PO PACK
17.0000 g | PACK | Freq: Two times a day (BID) | ORAL | Status: DC
  Administered 2023-08-02 – 2023-08-03 (×3): 17 g via ORAL
  Filled 2023-08-02 (×3): qty 1

## 2023-08-02 NOTE — Progress Notes (Signed)
Mobility Specialist - Progress Note   08/02/23 1451  Mobility  Activity Ambulated independently in hallway  Level of Assistance Independent  Assistive Device None  Distance Ambulated (ft) 750 ft  Activity Response Tolerated well  Mobility Referral Yes  $Mobility charge 1 Mobility  Mobility Specialist Start Time (ACUTE ONLY) 0158  Mobility Specialist Stop Time (ACUTE ONLY) 0206  Mobility Specialist Time Calculation (min) (ACUTE ONLY) 8 min   Pt received in bed and agreeable to mobility. No complaints during session. Pt to bed after session with all needs met.   [facility]

## 2023-08-02 NOTE — Plan of Care (Signed)

## 2023-08-02 NOTE — Progress Notes (Addendum)
PROGRESS NOTE    Sherri Schultz  ZOX:[id number] DOB: Jul 12, 1966 DOA: 07/29/2023 PCP: Lilyana Lippman Palau, FNP   Brief Narrative:  57 y.o. female with past medical history of obstructive sleep apnea, left breast cancer, right leg malignant melanoma, migraine, hypertension and anxiety was sent from urgent care with hypoxia.  Of note patient was recently seen at the primary care office on 07/09/2023 with exertional shortness of breath and bilateral neck and shoulder/chest pain with voice change for several weeks.  Chest x-ray was abnormal and was treated for GERD pneumonitis.  Follow-up chest CT showed midsternal lymphadenopathy with patchy bilateral infiltrates with left pleural effusion. HSP panel, fungal, and autoimmune labs negative except positive ANA.  Patient had planned EBUS for 08/21/2023 with Dr. Delton Coombes.  Underwent IR left thoracentesis on 07/18/23 which was positive for adenocarcinoma.  Patient does have history of left breast cancer (DCIS dx 11/2019) s/p bilateral lumpectomies, adjuvant radiation and maintained on tamoxifen since 05/2020 followed by Dr. Al Pimple.  Patient had stopped taking her tamoxifen one year ago.  Patient had initially  called PCCM office, she was sent to urgent care center, she was found to be hypoxic so she was sent to the ED for further workup.  In the ED, patient was hemodynamically stable except for hypoxia saturating 88% on room air.  Pulmonary was also consulted.  CT scan of the chest was done which was negative for pulmonary embolism but persistent mediastinal lymphadenopathy with a large left and small right pleural effusion progressive since previous CT scan with diffuse interstitial and alveolar infiltrate..  CT of the C-spine showed C4 compression fracture with severe height loss around 80% with 2 mm bony retropulsion highly suspicious for pathological fracture.  Patient was then admitted hospital for further evaluation and treatment.  Mri c spine-Numerous bone lesions compatible  with metastatic disease.  Known pathologic compression fracture at C4 with advanced height loss but no retropulsion or soft tissue tumor extension. MRI lumbar spine-Widespread bone metastases with all lumbar levels affected. No pathologic fracture. No epidural or extraosseous tumor identified.  Mild for age underlying lumbar spine degeneration with no spinal or foraminal stenosis. But asymmetric and degenerated facets at L4-L5. Asymmetric signal associated with the right facet could be degenerative and/or metastasis related. MRI thoracic spine- Widespread bone metastases with virtually all Thoracic levels affected. No pathologic fracture. No epidural or extraosseous tumor identified.Mild for age underlying thoracic spine degeneration. No spinal or foraminal stenosis.Abnormal Chest including left greater than right pleural effusions. 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  CT pulmonary embolism protocol without any PE.     COVID and respiratory viral panel negative.  Oxygen requirement improved to 2 L on saturating 96%. Status post 1 L thoracentesis left pleural space 07/31/2023.   Pulmonary following. She is scheduled for EBUS/bronchoscopy on 08/04/2023 at [facility] oxygen saturation 85% on room air  Bilateral shoulder and neck pain secondary to cervical spinal metastasis:  CT C-spine with C4 compression fracture with 80% height loss, metastatic lesions at C2, C4-C7 and T1 T3.  MRI of the C-spine showed numerous bony lesions compatible with metastatic disease with pathologic compression fracture of C4 with  advanced  height loss but no retropulsion or soft tissue tumor extension.  Spoke with neurosurgery for consultation in no surgical recommendations at this time advised cervical collar.  Plan for palliative radiation to cervical noted starting 9/6 Neurosurgery notes reviewed no plans for surgery. Currently on morphine added oxycodone and OxyContin  for pain control on discharge   Hypokalemia: Resolved  Anxiety disorder: Continue venlafaxine.   GERD: Continue PPI   History of DCIS of left breast: Patient was on tamoxifen which she stopped a year ago.  Patient used to follow Dr. Al Pimple.  No local recurrence.   Obstructive sleep apnea: Continue nocturnal CPAP  Constipation-senna Dulcolax and MiraLAX  Nausea-Zofran  Estimated body mass index is 27.53 kg/m as calculated from the following:   Height as of this encounter: 5' 4.5" (1.638 m).   Weight as of this encounter: 73.9 kg.  DVT prophylaxis: LOVENOX Code Status:full Family Communication:none Disposition Plan:  Status is: Inpatient Remains inpatient appropriate because:acute hypoxia   Consultants: pccm, neurosurgery, radiation oncology, oncology  Procedures: None Antimicrobials: None  Subjective: Very anxious nervous to go home did not have oxygen prior to admission   Objective: Vitals:   08/01/23 2017 08/02/23 0328 08/02/23 0900 08/02/23 0910  BP: 116/78 108/63    Pulse: 99 91    Resp: 18 18    Temp: 98.5 F (36.9 C) 98.4 F (36.9 C)    TempSrc: Oral Oral    SpO2: 92% 91% 95% (!) 85%  Weight:      Height:       No intake or output data in the 24 hours ending 08/02/23 1032  Filed Weights   07/30/23 0352 07/31/23 0449 07/31/23 1030  Weight: 73.7 kg 74.5 kg 73.9 kg    Examination:  General exam: Appears in no acute distress Respiratory system: Diminished breath sounds to auscultation. Respiratory effort normal. Cardiovascular system: S1 & S2 heard, RRR. No JVD, murmurs, rubs, gallops or clicks. No pedal  edema. Gastrointestinal system: Abdomen is nondistended, soft and nontender. No organomegaly or masses felt. Normal bowel sounds heard. Central nervous system: Alert and oriented.  Extremities: Symmetric 5 x 5 power.  Data Reviewed: I have personally reviewed following labs and imaging studies  CBC: Recent Labs  Lab 07/29/23 1349 07/29/23 1547 07/30/23 0910 07/31/23 0832  WBC 9.7 11.5* 9.1 9.1  HGB 12.9 14.1 12.4 13.0  HCT 39.9 44.0 38.1 39.8  MCV 93.0 96.1 96.0 94.5  PLT 418* 356 352 374   Basic Metabolic Panel: Recent Labs  Lab 07/29/23 1349 07/29/23 1547 07/30/23 0910 07/31/23 0832  NA 137  --  135 137  K 3.4*  --  3.6 3.7  CL 98  --  105 101  CO2 26  --  20* 26  GLUCOSE 92  --  111* 96  BUN 7  --  5* <5*  CREATININE 0.72 0.77 0.66 0.76  CALCIUM 9.3  --  8.7* 9.1  MG  --  1.9  --  2.0   GFR: Estimated Creatinine Clearance: 78.2 mL/min (by C-G formula based on SCr of 0.76 mg/dL). Liver Function Tests: Recent Labs  Lab 07/29/23 1349 07/30/23 0910  AST 27 23  ALT 15 12  ALKPHOS 92 79  BILITOT 0.6 0.2*  PROT 7.1 6.3*  ALBUMIN 3.2* 2.9*   No results for input(s): "LIPASE", "AMYLASE" in the last 168 hours. No results for input(s): "AMMONIA" in the last 168 hours. Coagulation Profile: No results for input(s): "INR", "PROTIME" in the last 168  hours. Cardiac Enzymes: No results for input(s): "CKTOTAL", "CKMB", "CKMBINDEX", "TROPONINI" in the last 168 hours. BNP (last 3 results) No results for input(s): "PROBNP" in the last 8760 hours. HbA1C: No results for input(s): "HGBA1C" in the last 72 hours. CBG: No results for input(s): "GLUCAP" in the last 168 hours. Lipid Profile: No results for input(s): "CHOL", "HDL", "LDLCALC", "TRIG", "CHOLHDL", "LDLDIRECT" in the last 72 hours. Thyroid Function Tests: No results for input(s): "TSH", "T4TOTAL", "FREET4", "T3FREE", "THYROIDAB" in the last 72 hours.  Anemia Panel: No results for input(s): "VITAMINB12",  "FOLATE", "FERRITIN", "TIBC", "IRON", "RETICCTPCT" in the last 72 hours. Sepsis Labs: No results for input(s): "PROCALCITON", "LATICACIDVEN" in the last 168 hours.  Recent Results (from the past 240 hour(s))  SARS Coronavirus 2 by RT PCR (hospital order, performed in [facility] hospital lab) *cepheid single result test* Anterior Nasal Swab     Status: None   Collection Time: 07/29/23  3:45 PM   Specimen: Anterior Nasal Swab  Result Value Ref Range Status   SARS Coronavirus 2 by RT PCR NEGATIVE NEGATIVE Final    Comment: Performed at [facility], [address], [place], [place] [address]  Respiratory (~20 pathogens) panel by PCR     Status: None   Collection Time: 07/29/23  3:46 PM   Specimen: Nasopharyngeal Swab; Respiratory  Result Value Ref Range Status   Adenovirus NOT DETECTED NOT DETECTED Final   Coronavirus 229E NOT DETECTED NOT DETECTED Final    Comment: (NOTE) The Coronavirus on the Respiratory Panel, DOES NOT test for the novel  Coronavirus (2019 nCoV)    Coronavirus HKU1 NOT DETECTED NOT DETECTED Final   Coronavirus NL63 NOT DETECTED NOT DETECTED Final   Coronavirus OC43 NOT DETECTED NOT DETECTED Final   Metapneumovirus NOT DETECTED NOT DETECTED Final   Rhinovirus / Enterovirus NOT DETECTED NOT DETECTED Final   Influenza A NOT DETECTED NOT DETECTED Final   Influenza B NOT DETECTED NOT DETECTED Final   Parainfluenza Virus 1 NOT DETECTED NOT DETECTED Final   Parainfluenza Virus 2 NOT DETECTED NOT DETECTED Final   Parainfluenza Virus 3 NOT DETECTED NOT DETECTED Final   Parainfluenza Virus 4 NOT DETECTED NOT DETECTED Final   Respiratory Syncytial Virus NOT DETECTED NOT DETECTED Final   Bordetella pertussis NOT DETECTED NOT DETECTED Final   Bordetella Parapertussis NOT DETECTED NOT DETECTED Final   Chlamydophila pneumoniae NOT DETECTED NOT DETECTED Final   Mycoplasma pneumoniae NOT DETECTED NOT DETECTED Final    Comment: Performed at [facility], [address], [place], [place] [address]         Radiology Studies: DG Chest Port 1 View  Result Date: 07/31/2023 CLINICAL DATA:  [id number] S/P thoracentesis [id number] EXAM: PORTABLE CHEST - 1 VIEW COMPARISON:  07/29/2023 FINDINGS: Some improvement in left upper lung and perihilar airspace opacities. Coarse diffuse interstitial and alveolar opacities throughout both lungs persist. Heart size and mediastinal contours are within normal limits. Persistent left lateral pleural thickening or effusion. Visualized bones unremarkable.  Surgical clips left upper abdomen. IMPRESSION: Improved left upper lung and perihilar airspace opacities. No pneumothorax. Electronically Signed   By: Corlis Leak M.D.   On: 07/31/2023 18:30     Scheduled Meds:  enoxaparin (LOVENOX) injection  40 mg Subcutaneous Q24H   pantoprazole  40 mg Oral Daily   sodium chloride flush  3 mL Intravenous Q12H   venlafaxine XR  150 mg Oral Daily   Continuous Infusions:   LOS: 4 days  Time spent: 35 minutes Alwyn Ren, MD  08/02/2023, 10:32 AM

## 2023-08-02 NOTE — TOC Progression Note (Signed)
Transition of Care [facility]) - Progression Note    Patient Details  Name: Sherri Schultz MRN: [id number] Date of Birth: 05-08-1966  Transition of Care [facility]) CM/SW Contact  Georgie Chard, [place] Phone Number: 08/02/2023, 10:22 AM  Clinical Narrative:    At this time nurse has reached out to this CSW about home o2 however provider is unsure of DC. Patient will still need to transport Monday to [facility] for Bronch as well Biopsy. TOC will continue to follow and order home o2 at patient DC.      Barriers to Discharge: Continued Medical Work up  Expected Discharge Plan and Services                                               Social Determinants of Health (SDOH) Interventions SDOH Screenings   Food Insecurity: No Food Insecurity (07/29/2023)  Housing: Low Risk  (07/29/2023)  Transportation Needs: No Transportation Needs (07/29/2023)  Utilities: Not At Risk (07/29/2023)  Alcohol Screen: Low Risk  (07/31/2023)  Depression (PHQ2-9): Low Risk  (07/31/2023)  Financial Resource Strain: Low Risk  (02/19/2023)   Received from [facility]  Physical Activity: Insufficiently Active (07/31/2023)  Social Connections: Socially Integrated (07/31/2023)  Stress: No Stress Concern Present (07/31/2023)  Tobacco Use: Low Risk  (07/29/2023)  Health Literacy: Adequate Health Literacy (07/31/2023)    Readmission Risk Interventions    07/31/2023    1:45 PM  Readmission Risk Prevention Plan  Post Dischage Appt Complete  Medication Screening Complete  Transportation Screening Complete

## 2023-08-02 NOTE — Progress Notes (Addendum)
SATURATION QUALIFICATIONS: Patient Saturations on Room Air at Rest = 85%  Patient Saturations on Room Air while Ambulating = 85%  Patient Saturations on 2 Liters of oxygen while Ambulating = 95%  Patient needs home oxygen d/t desaturation on room air. Patient nauseous after walking and IV Zofran administered

## 2023-08-03 DIAGNOSIS — C7951 Secondary malignant neoplasm of bone: Secondary | ICD-10-CM | POA: Diagnosis not present

## 2023-08-03 MED ORDER — OXYCODONE HCL ER 10 MG PO T12A: 10.0000 mg | EXTENDED_RELEASE_TABLET | Freq: Two times a day (BID) | ORAL | 0 refills | Status: DC

## 2023-08-03 MED ORDER — OXYCODONE HCL 10 MG PO TABS: 10.0000 mg | ORAL_TABLET | ORAL | 0 refills | Status: DC | PRN

## 2023-08-03 MED ORDER — POLYETHYLENE GLYCOL 3350 17 G PO PACK: 17.0000 g | PACK | Freq: Two times a day (BID) | ORAL | 0 refills | Status: DC

## 2023-08-03 MED ORDER — ONDANSETRON HCL 4 MG PO TABS: 4.0000 mg | ORAL_TABLET | Freq: Four times a day (QID) | ORAL | 0 refills | Status: DC | PRN

## 2023-08-03 MED ORDER — SENNOSIDES-DOCUSATE SODIUM 8.6-50 MG PO TABS: 2.0000 | ORAL_TABLET | Freq: Two times a day (BID) | ORAL | Status: DC

## 2023-08-03 MED ORDER — LORAZEPAM 0.5 MG PO TABS: ORAL_TABLET | ORAL | 0 refills | Status: DC

## 2023-08-03 NOTE — Progress Notes (Signed)
Mobility Specialist - Progress Note   08/03/23 1040  Mobility  Activity Ambulated independently in hallway  Level of Assistance Independent  Assistive Device None  Distance Ambulated (ft) 600 ft  Activity Response Tolerated well  Mobility Referral Yes  $Mobility charge 1 Mobility  Mobility Specialist Start Time (ACUTE ONLY) 1031  Mobility Specialist Stop Time (ACUTE ONLY) 1038  Mobility Specialist Time Calculation (min) (ACUTE ONLY) 7 min   Pt received in recliner and agreeable to mobility. No complaints during session. Pt to recliner after session with all needs met.    [place]

## 2023-08-03 NOTE — TOC Initial Note (Signed)
Transition of Care [facility]) - Initial/Assessment Note    Patient Details  Name: Sherri Schultz MRN: [id number] Date of Birth: 29-Apr-1966  Transition of Care [facility]) CM/SW Contact:    Georgie Chard, LCSW Phone Number: 08/03/2023, 9:54 AM  Clinical Narrative:                  CSW met patient at bedside at this time CSW has ordered o2 for patient. Patient stated that there are no further needs at this time.  Expected Discharge Plan: Home/Self Care Barriers to Discharge: No Barriers Identified   Patient Goals and CMS Choice Patient states their goals for this hospitalization and ongoing recovery are:: N/A          Expected Discharge Plan and Services     Post Acute Care Choice: Durable Medical Equipment Living arrangements for the past 2 months: Single Family Home                 DME Arranged: Oxygen DME Agency: [employer] Date DME Agency Contacted: 08/03/23 Time DME Agency Contacted: [phone] Representative spoke with at DME Agency: [place]            Prior Living Arrangements/Services Living arrangements for the past 2 months: Single Family Home Lives with:: Spouse Patient language and need for interpreter reviewed:: No Do you feel safe going back to the place where you live?: Yes      Need for Family Participation in Patient Care: No (Comment) Care giver support system in place?: Yes (comment)   Criminal Activity/Legal Involvement Pertinent to Current Situation/Hospitalization: No - Comment as needed  Activities of Daily Living Home Assistive Devices/Equipment: Blood pressure cuff, Eyeglasses, Hand-held shower hose, Long-handled shoehorn, Long-handled sponge, Reacher, Scales ADL Screening (condition at time of admission) Patient's cognitive ability adequate to safely complete daily activities?: Yes Is the patient deaf or have difficulty hearing?: No Does the patient have difficulty seeing, even when wearing glasses/contacts?: No Does the patient have difficulty  concentrating, remembering, or making decisions?: No Patient able to express need for assistance with ADLs?: Yes Does the patient have difficulty dressing or bathing?: No Independently performs ADLs?: Yes (appropriate for developmental age) Does the patient have difficulty walking or climbing stairs?: Yes Weakness of Legs: None Weakness of Arms/Hands: None  Permission Sought/Granted                  Emotional Assessment Appearance:: Appears stated age     Orientation: : Oriented to Self, Oriented to Place, Oriented to  Time, Oriented to Situation Alcohol / Substance Use: Not Applicable Psych Involvement: No (comment)  Admission diagnosis:  Shortness of breath [R06.02] Cancer with pulmonary metastases (HCC) [C78.00] Dyspnea, unspecified type [R06.00] Patient Active Problem List   Diagnosis Date Noted   Pleural effusion 07/31/2023   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) 07/30/2023   Shortness of breath 07/29/2023   Cancer with pulmonary metastases (HCC) 07/29/2023   Abnormal CT of the chest 07/16/2023   Chest discomfort 07/09/2023   Mild obstructive sleep apnea 05/15/2023   Excessive daytime sleepiness 04/04/2023   Migraine headache without aura 04/04/2023   Insomnia 04/04/2023   Genetic testing 01/01/2020   Malignant melanoma of right lower leg (HCC) 12/22/2019   Family history of breast cancer    Family history of melanoma    Family history of colon cancer    Family history of brain cancer    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   Closed displaced trimalleolar fracture of  lower leg with routine healing 06/19/2018   PCP:  Elizabeth Palau, FNP Pharmacy:   [facility] [phone] - [place], [place] - [address] [facility] [address] [place] [address] Phone: [phone] Fax: [phone]     Social Determinants of Health (SDOH) Social History: SDOH Screenings   Food Insecurity: No Food  Insecurity (07/29/2023)  Housing: Low Risk  (07/29/2023)  Transportation Needs: No Transportation Needs (07/29/2023)  Utilities: Not At Risk (07/29/2023)  Alcohol Screen: Low Risk  (07/31/2023)  Depression (PHQ2-9): Low Risk  (07/31/2023)  Financial Resource Strain: Low Risk  (02/19/2023)   Received from [facility]  Physical Activity: Insufficiently Active (07/31/2023)  Social Connections: Socially Integrated (07/31/2023)  Stress: No Stress Concern Present (07/31/2023)  Tobacco Use: Low Risk  (07/29/2023)  Health Literacy: Adequate Health Literacy (07/31/2023)   SDOH Interventions: Alcohol Usage Interventions: Intervention Not Indicated (Score <7) Physical Activity Interventions: Intervention Not Indicated Stress Interventions: Intervention Not Indicated Social Connections Interventions: Intervention Not Indicated Health Literacy Interventions: Intervention Not Indicated   Readmission Risk Interventions    08/03/2023    9:51 AM 07/31/2023    1:45 PM  Readmission Risk Prevention Plan  Post Dischage Appt Complete Complete  Medication Screening Complete Complete  Transportation Screening Complete Complete

## 2023-08-04 ENCOUNTER — Encounter (HOSPITAL_COMMUNITY): Payer: Self-pay | Admitting: Emergency Medicine

## 2023-08-04 ENCOUNTER — Other Ambulatory Visit: Payer: Self-pay

## 2023-08-04 ENCOUNTER — Ambulatory Visit (HOSPITAL_COMMUNITY)
Admission: RE | Admit: 2023-08-04 | Discharge: 2023-08-04 | Disposition: A | Discharge status: Home / Self Care | Payer: BC Managed Care – PPO | Attending: Emergency Medicine | Admitting: Emergency Medicine

## 2023-08-04 ENCOUNTER — Ambulatory Visit
Admission: RE | Admit: 2023-08-04 | Discharge: 2023-08-04 | Disposition: A | Discharge status: Home / Self Care | Payer: BC Managed Care – PPO | Source: Ambulatory Visit | Attending: Radiation Oncology | Admitting: Radiation Oncology

## 2023-08-04 ENCOUNTER — Ambulatory Visit (HOSPITAL_COMMUNITY): Payer: BC Managed Care – PPO

## 2023-08-04 ENCOUNTER — Encounter (HOSPITAL_COMMUNITY)
Admission: RE | Disposition: A | Discharge status: Home / Self Care | Payer: Self-pay | Source: Home / Self Care | Attending: Emergency Medicine

## 2023-08-04 ENCOUNTER — Telehealth: Payer: Self-pay | Admitting: *Deleted

## 2023-08-04 ENCOUNTER — Telehealth: Payer: Self-pay | Admitting: Emergency Medicine

## 2023-08-04 ENCOUNTER — Ambulatory Visit (HOSPITAL_COMMUNITY): Admit: 2023-08-04 | Payer: BC Managed Care – PPO | Admitting: Emergency Medicine

## 2023-08-04 DIAGNOSIS — C7801 Secondary malignant neoplasm of right lung: Secondary | ICD-10-CM

## 2023-08-04 DIAGNOSIS — R918 Other nonspecific abnormal finding of lung field: Secondary | ICD-10-CM | POA: Diagnosis not present

## 2023-08-04 DIAGNOSIS — G4733 Obstructive sleep apnea (adult) (pediatric): Secondary | ICD-10-CM | POA: Insufficient documentation

## 2023-08-04 DIAGNOSIS — M545 Low back pain, unspecified: Secondary | ICD-10-CM | POA: Diagnosis not present

## 2023-08-04 DIAGNOSIS — G8929 Other chronic pain: Secondary | ICD-10-CM | POA: Insufficient documentation

## 2023-08-04 DIAGNOSIS — R9389 Abnormal findings on diagnostic imaging of other specified body structures: Secondary | ICD-10-CM

## 2023-08-04 DIAGNOSIS — Z853 Personal history of malignant neoplasm of breast: Secondary | ICD-10-CM | POA: Diagnosis not present

## 2023-08-04 DIAGNOSIS — Z923 Personal history of irradiation: Secondary | ICD-10-CM | POA: Insufficient documentation

## 2023-08-04 DIAGNOSIS — C7802 Secondary malignant neoplasm of left lung: Secondary | ICD-10-CM

## 2023-08-04 DIAGNOSIS — C801 Malignant (primary) neoplasm, unspecified: Secondary | ICD-10-CM | POA: Insufficient documentation

## 2023-08-04 DIAGNOSIS — I10 Essential (primary) hypertension: Secondary | ICD-10-CM | POA: Insufficient documentation

## 2023-08-04 DIAGNOSIS — C771 Secondary and unspecified malignant neoplasm of intrathoracic lymph nodes: Secondary | ICD-10-CM | POA: Insufficient documentation

## 2023-08-04 DIAGNOSIS — J90 Pleural effusion, not elsewhere classified: Secondary | ICD-10-CM | POA: Insufficient documentation

## 2023-08-04 DIAGNOSIS — R590 Localized enlarged lymph nodes: Secondary | ICD-10-CM | POA: Diagnosis present

## 2023-08-04 DIAGNOSIS — C7951 Secondary malignant neoplasm of bone: Secondary | ICD-10-CM

## 2023-08-04 HISTORY — PX: VIDEO BRONCHOSCOPY: SHX5072

## 2023-08-04 HISTORY — DX: Dyspnea, unspecified: R06.00

## 2023-08-04 HISTORY — PX: BRONCHIAL NEEDLE ASPIRATION BIOPSY: SHX5106

## 2023-08-04 HISTORY — DX: Headache, unspecified: R51.9

## 2023-08-04 HISTORY — PX: VIDEO BRONCHOSCOPY WITH ENDOBRONCHIAL ULTRASOUND: SHX6177

## 2023-08-04 HISTORY — DX: Sleep apnea, unspecified: G47.30

## 2023-08-04 HISTORY — DX: Gastro-esophageal reflux disease without esophagitis: K21.9

## 2023-08-04 LAB — RAD ONC ARIA SESSION SUMMARY
Course Elapsed Days: 3
Plan Fractions Treated to Date: 3
Plan Prescribed Dose Per Fraction: 3 Gy
Plan Total Fractions Prescribed: 10
Plan Total Prescribed Dose: 30 Gy
Reference Point Dosage Given to Date: 9 Gy
Reference Point Session Dosage Given: 3 Gy
Session Number: 3

## 2023-08-04 SURGERY — BRONCHOSCOPY, WITH FLUOROSCOPY
Anesthesia: General

## 2023-08-04 MED ORDER — OXYCODONE HCL 5 MG PO TABS
ORAL_TABLET | ORAL | Status: AC
  Filled 2023-08-04: qty 1

## 2023-08-04 MED ORDER — FENTANYL CITRATE (PF) 250 MCG/5ML IJ SOLN
INTRAMUSCULAR | Status: DC | PRN
  Administered 2023-08-04 (×2): 25 ug via INTRAVENOUS

## 2023-08-04 MED ORDER — DROPERIDOL 2.5 MG/ML IJ SOLN: 0.6250 mg | Freq: Once | INTRAMUSCULAR | Status: DC | PRN

## 2023-08-04 MED ORDER — FENTANYL CITRATE (PF) 100 MCG/2ML IJ SOLN
INTRAMUSCULAR | Status: AC
  Filled 2023-08-04: qty 2

## 2023-08-04 MED ORDER — ROCURONIUM BROMIDE 10 MG/ML (PF) SYRINGE
PREFILLED_SYRINGE | INTRAVENOUS | Status: DC | PRN
  Administered 2023-08-04: 50 mg via INTRAVENOUS

## 2023-08-04 MED ORDER — PROPOFOL 10 MG/ML IV BOLUS
INTRAVENOUS | Status: DC | PRN
  Administered 2023-08-04: 50 mg via INTRAVENOUS
  Administered 2023-08-04: 100 mg via INTRAVENOUS

## 2023-08-04 MED ORDER — FENTANYL CITRATE (PF) 250 MCG/5ML IJ SOLN
INTRAMUSCULAR | Status: AC
  Filled 2023-08-04: qty 5

## 2023-08-04 MED ORDER — CHLORHEXIDINE GLUCONATE 0.12 % MT SOLN
OROMUCOSAL | Status: AC
  Administered 2023-08-04: 15 mL
  Filled 2023-08-04: qty 15

## 2023-08-04 MED ORDER — FENTANYL CITRATE (PF) 100 MCG/2ML IJ SOLN
25.0000 ug | INTRAMUSCULAR | Status: DC | PRN
  Administered 2023-08-04 (×2): 25 ug via INTRAVENOUS
  Administered 2023-08-04: 50 ug via INTRAVENOUS

## 2023-08-04 MED ORDER — OXYCODONE HCL 5 MG PO TABS
5.0000 mg | ORAL_TABLET | Freq: Once | ORAL | Status: AC
  Administered 2023-08-04: 5 mg via ORAL
  Filled 2023-08-04: qty 1

## 2023-08-04 MED ORDER — SUGAMMADEX SODIUM 200 MG/2ML IV SOLN
INTRAVENOUS | Status: DC | PRN
  Administered 2023-08-04: 200 mg via INTRAVENOUS

## 2023-08-04 MED ORDER — LIDOCAINE 2% (20 MG/ML) 5 ML SYRINGE
INTRAMUSCULAR | Status: DC | PRN
  Administered 2023-08-04: 80 mg via INTRAVENOUS

## 2023-08-04 MED ORDER — ACETAMINOPHEN 10 MG/ML IV SOLN: 1000.0000 mg | Freq: Once | INTRAVENOUS | Status: DC | PRN

## 2023-08-04 MED ORDER — LACTATED RINGERS IV SOLN: INTRAVENOUS | Status: DC

## 2023-08-04 MED ORDER — ACETAMINOPHEN 10 MG/ML IV SOLN
INTRAVENOUS | Status: AC
  Administered 2023-08-04: 1000 mg
  Filled 2023-08-04: qty 100

## 2023-08-04 MED ORDER — ONDANSETRON HCL 4 MG/2ML IJ SOLN
INTRAMUSCULAR | Status: DC | PRN
  Administered 2023-08-04: 4 mg via INTRAVENOUS

## 2023-08-04 MED ORDER — DEXAMETHASONE SODIUM PHOSPHATE 10 MG/ML IJ SOLN
INTRAMUSCULAR | Status: DC | PRN
  Administered 2023-08-04: 10 mg via INTRAVENOUS

## 2023-08-04 NOTE — Anesthesia Preprocedure Evaluation (Addendum)
Anesthesia Evaluation  Patient identified by MRN, date of birth, ID band Patient awake    Reviewed: Allergy & Precautions, H&P , NPO status , Patient's Chart, lab work & pertinent test results  Airway Mallampati: II  TM Distance: >3 FB Neck ROM: Full    Dental no notable dental hx.    Pulmonary sleep apnea  Lung cancer with mets On 2Lnc at home at baseline   Pulmonary exam normal breath sounds clear to auscultation       Cardiovascular hypertension, Normal cardiovascular exam Rhythm:Regular Rate:Normal     Neuro/Psych  Headaches  Anxiety      negative psych ROS   GI/Hepatic Neg liver ROS,GERD  ,,  Endo/Other  negative endocrine ROS    Renal/GU negative Renal ROS  negative genitourinary   Musculoskeletal negative musculoskeletal ROS (+)    Abdominal   Peds negative pediatric ROS (+)  Hematology negative hematology ROS (+)   Anesthesia Other Findings   Reproductive/Obstetrics negative OB ROS                             Anesthesia Physical Anesthesia Plan  ASA: 3  Anesthesia Plan: General   Post-op Pain Management:    Induction: Intravenous  PONV Risk Score and Plan: Ondansetron and Dexamethasone  Airway Management Planned: Oral ETT  Additional Equipment:   Intra-op Plan:   Post-operative Plan: Extubation in OR  Informed Consent: I have reviewed the patients History and Physical, chart, labs and discussed the procedure including the risks, benefits and alternatives for the proposed anesthesia with the patient or authorized representative who has indicated his/her understanding and acceptance.     Dental advisory given  Plan Discussed with: CRNA  Anesthesia Plan Comments:        Anesthesia Quick Evaluation

## 2023-08-04 NOTE — Anesthesia Procedure Notes (Signed)
Procedure Name: Intubation Date/Time: 08/04/2023 2:32 PM  Performed by: Gwenyth Allegra, CRNAPre-anesthesia Checklist: Patient identified, Emergency Drugs available, Suction available, Patient being monitored and Timeout performed Patient Re-evaluated:Patient Re-evaluated prior to induction Oxygen Delivery Method: Circle system utilized Preoxygenation: Pre-oxygenation with 100% oxygen Induction Type: Combination inhalational/ intravenous induction Ventilation: Mask ventilation without difficulty and Oral airway inserted - appropriate to patient size Laryngoscope Size: Mac and 3 Grade View: Grade II Tube type: Oral Tube size: 8.5 mm Number of attempts: 1 Airway Equipment and Method: Stylet Placement Confirmation: ETT inserted through vocal cords under direct vision, positive ETCO2 and breath sounds checked- equal and bilateral Secured at: 22 cm Tube secured with: Tape Dental Injury: Teeth and Oropharynx as per pre-operative assessment

## 2023-08-04 NOTE — Discharge Instructions (Addendum)
Flexible Bronchoscopy, Care After This sheet gives you information about how to care for yourself after your test. Your doctor may also give you more specific instructions. If you have problems or questions, contact your doctor. Follow these instructions at home: Eating and drinking When your numbness is gone and your cough and gag reflexes have come back, you may: Eat only soft foods. Slowly drink liquids. The day after the test, go back to your normal diet. Driving Do not drive for 24 hours if you were given a medicine to help you relax (sedative). Do not drive or use heavy machinery while taking prescription pain medicine. General instructions  Take over-the-counter and prescription medicines only as told by your doctor. Return to your normal activities as told. Ask what activities are safe for you. Do not use any products that have nicotine or tobacco in them. This includes cigarettes and e-cigarettes. If you need help quitting, ask your doctor. Keep all follow-up visits as told by your doctor. This is important. It is very important if you had a tissue sample (biopsy) taken. Get help right away if: You have shortness of breath that gets worse. You get light-headed. You feel like you are going to pass out (faint). You have chest pain. You cough up: More than a little blood. More blood than before. Summary Do not eat or drink anything (not even water) for 2 hours after your test, or until your numbing medicine wears off. Do not use cigarettes. Do not use e-cigarettes. Get help right away if you have chest pain.  Please call our office for any questions or concerns.  [phone].  This information is not intended to replace advice given to you by your health care provider. Make sure you discuss any questions you have with your health care provider. Document Released: 09/08/2009 Document Revised: 10/24/2017 Document Reviewed: 11/29/2016 Elsevier Patient Education  2020 Elsevier  Inc.  

## 2023-08-04 NOTE — Telephone Encounter (Signed)
Patient is followed by Dr Al Pimple in Oncology. She needs an office visit in Dr Iruku's office as soon as able. New diagnosis adenocarcinoma.  Please see if we can get an OV set up.

## 2023-08-04 NOTE — Interval H&P Note (Signed)
History and Physical Interval Note:  08/04/2023 1:07 PM  Sherri Schultz  has presented today for surgery, with the diagnosis of BILATERAL PULMONARY INFILTRATES.  The various methods of treatment have been discussed with the patient and family. After consideration of risks, benefits and other options for treatment, the patient has consented to  Procedure(s): VIDEO BRONCHOSCOPY WITH FLUORO (N/A) VIDEO BRONCHOSCOPY WITH ENDOBRONCHIAL ULTRASOUND (N/A) as a surgical intervention.  The patient's history has been reviewed, patient examined, no change in status, stable for surgery.  I have reviewed the patient's chart and labs.  Questions were answered to the patient's satisfaction.     Leslye Peer

## 2023-08-04 NOTE — Discharge Summary (Signed)
Physician Discharge Summary  Sherri Schultz Auble ZOX:[id number] DOB: 22-Jan-1966 DOA: 07/29/2023  PCP: Pualani Borah Palau, FNP  Admit date: 07/29/2023 Discharge date:08/03/2023 Admitted From: home Disposition:  home  Recommendations for Outpatient Follow-up:  Follow up with PCP in 1-2 weeks Please obtain BMP/CBC in one week Please follow up with oncology Home Health:no Equipment/Devices:none Discharge Condition:stable CODE STATUS:full Diet recommendation: cardiac   Brief/Interim Summary:  57 y.o. female with past medical history of obstructive sleep apnea, left breast cancer, right leg malignant melanoma, migraine, hypertension and anxiety was sent from urgent care with hypoxia.  Of note patient was recently seen at the primary care office on 07/09/2023 with exertional shortness of breath and bilateral neck and shoulder/chest pain with voice change for several weeks.  Chest x-ray was abnormal and was treated for GERD pneumonitis.  Follow-up chest CT showed midsternal lymphadenopathy with patchy bilateral infiltrates with left pleural effusion. HSP panel, fungal, and autoimmune labs negative except positive ANA.  Patient had planned EBUS for 08/04/2023 with Dr. Delton Coombes.  Underwent IR left thoracentesis on 07/18/23 which was positive for adenocarcinoma.  Patient does have history of left breast cancer (DCIS dx 11/2019) s/p bilateral lumpectomies, adjuvant radiation and maintained on tamoxifen since 05/2020 followed by Dr. Al Pimple.  Patient had stopped taking her tamoxifen one year ago.  Patient had initially  called PCCM office, she was sent to urgent care center, she was found to be hypoxic so she was sent to the ED for further workup.  In the ED, patient was hemodynamically stable except for hypoxia saturating 88% on room air.  Pulmonary was also consulted.  CT scan of the chest was done which was negative for pulmonary embolism but persistent mediastinal lymphadenopathy with a large left and small right  pleural effusion progressive since previous CT scan with diffuse interstitial and alveolar infiltrate..  CT of the C-spine showed C4 compression fracture with severe height loss around 80% with 2 mm bony retropulsion highly suspicious for pathological fracture.  Patient was then admitted hospital for further evaluation and treatment.  Mri c spine-Numerous bone lesions compatible with metastatic disease.  Known pathologic compression fracture at C4 with advanced height loss but no retropulsion or soft tissue tumor extension. MRI lumbar spine-Widespread bone metastases with all lumbar levels affected. No pathologic fracture. No epidural or extraosseous tumor identified.  Mild for age underlying lumbar spine degeneration with no spinal or foraminal stenosis. But asymmetric and degenerated facets at L4-L5. Asymmetric signal associated with the right facet could be degenerative and/or metastasis related. MRI thoracic spine- Widespread bone metastases with virtually all Thoracic levels affected. No pathologic fracture. No epidural or extraosseous tumor identified.Mild for age underlying thoracic spine degeneration. No spinal or foraminal stenosis.Abnormal Chest including left greater than right pleural effusions. 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  CT pulmonary embolism protocol without any PE.  She had 1 L thoracentesis done left pleural space on 07/31/2023 with improvement in her breathing.   COVID and respiratory viral panel negative.  Oxygen requirement improved to 2 L on saturating 96%. She is scheduled for  EBUS/bronchoscopy on 08/04/2023 at [facility] with Dr. Delton Coombes. Ambulatory oxygen saturation 85% on room  air she was discharged home on 2 L of oxygen.   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  MRI of the C-spine showed numerous bony lesions compatible with metastatic disease with pathologic compression fracture of C4 with advanced height loss but no retropulsion or soft tissue tumor extension.  Spoke with neurosurgery for consultation in no surgical recommendations at this time advised cervical collar.  Plan for palliative radiation to cervical noted starting 9/6.  Patient was seen by neurosurgery with no plans for surgery.  She was treated with oxycodone and OxyContin during the hospital stay.   Hypokalemia: Resolved   Anxiety disorder: Continue venlafaxine.   GERD: Continue PPI   History of DCIS of left breast: Patient was on tamoxifen which she stopped a year ago.  Patient to follow up with Dr. Al Pimple.     Obstructive sleep apnea: Continue nocturnal CPAP   Constipation-senna Dulcolax and MiraLAX   Nausea-Zofran  Estimated body mass index is 27.53 kg/m as calculated from the following:   Height as of this encounter: 5' 4.5" (1.638 m).   Weight as of this encounter: 73.9 kg.  Discharge Instructions  Discharge Instructions     Diet - low sodium heart healthy   Complete by: As directed    Increase activity slowly   Complete by: As directed       Allergies as of 08/03/2023   No Known Allergies      Medication List     STOP taking these medications    predniSONE 10 MG tablet Commonly known as: DELTASONE       TAKE these medications    botulinum toxin Type A 200 units injection Commonly known as: BOTOX Inject 200 Units into the muscle every 3 (three) months.   LORazepam 0.5 MG tablet Commonly known as: Ativan 1 tab po 30 minutes prior  to radiation or MRI scans   nitroGLYCERIN 0.4 MG SL tablet Commonly known as: NITROSTAT Place 0.4 mg under the tongue every 5 (five) minutes as needed for chest pain.   Nurtec 75 MG Tbdp Generic drug: Rimegepant Sulfate Take by mouth.   omeprazole 40 MG capsule Commonly known as: PRILOSEC Take 40 mg by mouth daily as needed (Indigestion).   ondansetron 4 MG tablet Commonly known as: ZOFRAN Take 1 tablet (4 mg total) by mouth every 6 (six) hours as needed for nausea.   Oxycodone HCl 10 MG Tabs Take 1 tablet (10 mg total) by mouth every 4 (four) hours as needed for severe pain.   oxyCODONE 10 mg 12 hr tablet Commonly known as: OXYCONTIN Take 1 tablet (10 mg total) by mouth every 12 (twelve) hours.   polyethylene glycol 17 g packet Commonly known as: MIRALAX / GLYCOLAX Take 17 g by mouth 2 (two) times daily.   propranolol ER 80 MG 24 hr capsule Commonly known as: INDERAL LA Take 80 mg by mouth daily.   senna-docusate 8.6-50 MG tablet Commonly known as: Senokot-S Take 2 tablets by mouth 2 (two) times daily.   tiZANidine 4 MG capsule Commonly known as: ZANAFLEX Take 2 mg by mouth at bedtime as needed for muscle spasms.   Valtrex 1000 MG tablet Generic drug: valACYclovir Take 2 tablets by mouth 2 (two) times daily as needed ([place]).   venlafaxine XR 150 MG 24 hr capsule Commonly known as: EFFEXOR-XR Take 1 capsule by mouth daily.  Follow-up Information     [facility] .   Specialty: Radiology Contact information: [address] [place] [address] [phone]        Elford Evilsizer Palau, FNP Follow up.   Specialty: Nurse Practitioner Contact information: [address] [place] [address] [phone]         [facility] Follow up.   Why: CSW ordered 0/2 to patient's room               No Known Allergies  Consultations:pccm   Procedures/Studies: DG CHEST PORT 1  VIEW  Result Date: 08/02/2023 CLINICAL DATA:  [id number] SOB (shortness of breath) [id number] EXAM: PORTABLE CHEST - 1 VIEW COMPARISON:  07/31/2023 FINDINGS: Diffuse interstitial opacities throughout both lungs with some increase in patchy airspace consolidation in the left mid and lower lung, and to a lesser degree right mid lung. Heart size and mediastinal contours are within normal limits. Probable small left effusion. Visualized bones unremarkable. IMPRESSION: Increasing bilateral interstitial and airspace opacities, left greater than right. Electronically Signed   By: Corlis Leak M.D.   On: 08/02/2023 15:13   DG Chest Port 1 View  Result Date: 07/31/2023 CLINICAL DATA:  [id number] S/P thoracentesis [id number] EXAM: PORTABLE CHEST - 1 VIEW COMPARISON:  07/29/2023 FINDINGS: Some improvement in left upper lung and perihilar airspace opacities. Coarse diffuse interstitial and alveolar opacities throughout both lungs persist. Heart size and mediastinal contours are within normal limits. Persistent left lateral pleural thickening or effusion. Visualized bones unremarkable.  Surgical clips left upper abdomen. IMPRESSION: Improved left upper lung and perihilar airspace opacities. No pneumothorax. Electronically Signed   By: Corlis Leak M.D.   On: 07/31/2023 18:30   MR Lumbar Spine W Wo Contrast  Result Date: 07/31/2023 CLINICAL DATA:  57 year old female with osseous metastatic disease. History of breast cancer. EXAM: MRI LUMBAR SPINE WITHOUT AND WITH CONTRAST TECHNIQUE: Multiplanar and multiecho pulse sequences of the lumbar spine were obtained without and with intravenous contrast. CONTRAST:  7mL GADAVIST GADOBUTROL 1 MMOL/ML IV SOLN COMPARISON:  Thoracic MRI today reported separately. CT Abdomen and Pelvis yesterday. FINDINGS: Segmentation:  Normal, concordant with the thoracic numbering today. Alignment:  Normal lumbar lordosis. Vertebrae: Widespread osseous metastatic disease with all lumbar levels affected. Partially visible  sacral and pelvic T1 hypointense and enhancing bone metastases also. No lumbar compression fracture. Conus medullaris and cauda equina: Conus extends to the T12 level. No lower spinal cord or conus signal abnormality. Normal cauda equina nerve roots. No abnormal intradural enhancement or dural thickening identified on mildly motion degraded sagittal and moderately motion degraded axial postcontrast imaging. Paraspinal and other soft tissues: Stable visible abdominal viscera. No lumbar epidural or extraosseous tumor identified at this time. Disc levels: Generally mild for age underlying lumbar spine degeneration, although asymmetric facet degeneration at L4-L5 greater on the right. Small facet joint fluid there (series four image 31). Unclear to what extent superimposed facet metastases could be contributing to the facet asymmetry here. But there is no associated spinal or foraminal stenosis. IMPRESSION: 1. Widespread bone metastases with all lumbar levels affected. No pathologic fracture. No epidural or extraosseous tumor identified. 2. Mild for age underlying lumbar spine degeneration with no spinal or foraminal stenosis. But asymmetric and degenerated facets at L4-L5. Asymmetric signal associated with the right facet could be degenerative and/or metastasis related. Electronically Signed   By: Odessa Fleming M.D.   On: 07/31/2023 08:20   MR THORACIC SPINE W WO CONTRAST  Result Date: 07/31/2023 CLINICAL DATA:  57 year old female with osseous metastatic disease. History of breast cancer. EXAM: MRI THORACIC WITHOUT AND WITH CONTRAST TECHNIQUE: Multiplanar and multiecho pulse sequences of the thoracic spine were obtained without and with intravenous contrast. CONTRAST:  7mL GADAVIST GADOBUTROL 1 MMOL/ML IV SOLN COMPARISON:  Cervical spine MRI yesterday. CTA chest 07/29/2023. CT Abdomen and Pelvis yesterday. FINDINGS: Limited cervical spine imaging:  Stable since yesterday. Thoracic spine segmentation: Normal on the  comparison CTs, small ribs at T12. Alignment: Stable thoracic kyphosis. No significant spondylolisthesis. Vertebrae: T1 hypointense and enhancing bone metastases at all thoracic levels. Widely scattered vertebral body and intermittent posterior element involvement. The T9 vertebral body is 1 of the least affected by MRI, suggesting prominent sclerosis there by CT could be treated metastasis. But there is right T9 posterior element involvement. No pathologic compression fracture at this time. Small T11 inferior endplate deformity which is more likely a Schmorl's node. Cord: Within normal limits. No spinal cord edema. No abnormal intradural enhancement. Conus medullaris appears normal. Paraspinal and other soft tissues: Abnormal chest including moderate to large left pleural effusion which appears to be layering. More complex appearing but smaller right pleural effusion. Some thoracic epidural lipomatosis (benign). No epidural or extraosseous tumor extension identified at this time. Disc levels: Mild for age thoracic spine degeneration. No thoracic spinal stenosis. No significant foraminal stenosis. IMPRESSION: 1. Widespread bone metastases with virtually all Thoracic levels affected. No pathologic fracture. No epidural or extraosseous tumor identified. 2. Mild for age underlying thoracic spine degeneration. No spinal or foraminal stenosis. 3. Abnormal Chest including left greater than right pleural effusions. Electronically Signed   By: Odessa Fleming M.D.   On: 07/31/2023 08:15   CT ABDOMEN PELVIS W CONTRAST  Result Date: 07/30/2023 CLINICAL DATA:  Metastatic disease evaluation lung mass, bone mets EXAM: CT ABDOMEN AND PELVIS WITH CONTRAST TECHNIQUE: Multidetector CT imaging of the abdomen and pelvis was performed using the standard protocol following bolus administration of intravenous contrast. RADIATION DOSE REDUCTION: This exam was performed according to the departmental dose-optimization program which includes  automated exposure control, adjustment of the mA and/or kV according to patient size and/or use of iterative reconstruction technique. CONTRAST:  75mL OMNIPAQUE IOHEXOL 350 MG/ML SOLN COMPARISON:  CT angiography chest 07/29/2023, CT angiography chest 07/11/2023 FINDINGS: Lower chest: Irregular interstitial and patchy airspace opacities of visualized lower lungs again noted. Persistent moderate volume partially visualized left pleural effusion with question associate pleural thickening. Trace right pleural effusion. Hepatobiliary: No focal liver abnormality. No gallstones, gallbladder wall thickening, or pericholecystic fluid. No biliary dilatation. Pancreas: No focal lesion. Normal pancreatic contour. No surrounding inflammatory changes. No main pancreatic ductal dilatation. Spleen: Normal in size without focal abnormality. Adrenals/Urinary Tract: No adrenal nodule bilaterally. Bilateral kidneys enhance symmetrically. No hydronephrosis. No hydroureter. The urinary bladder is unremarkable. Stomach/Bowel: PO contrast reaches colon. Stomach is within normal limits. No evidence of bowel wall thickening or dilatation. Appendix appears normal. Vascular/Lymphatic: No abdominal aorta or iliac aneurysm. Mild atherosclerotic plaque of the aorta and its branches. No abdominal, pelvic, or inguinal lymphadenopathy. Reproductive: Uterus and bilateral adnexa are unremarkable. Other: No intraperitoneal free fluid. No intraperitoneal free gas. No organized fluid collection. Musculoskeletal: No abdominal wall hernia or abnormality. Redemonstration of indeterminate T9 mixed lytic and sclerotic osseous. No new suspicious lytic or blastic osseous lesions. No acute displaced fracture. Multilevel degenerative changes of the spine. IMPRESSION: 1. No acute intra-abdominal or intrapelvic abnormality. No intra-abdominal or intrapelvic metastatic findings. 2. Irregular interstitial and patchy  airspace opacities of visualized lower lungs.  Persistent moderate volume partially visualized left pleural effusion with question associate pleural thickening. Trace right pleural effusion. Findings better evaluated on CT angiography chest 07/29/2023. 3. Persistent indeterminate T9 mixed lytic and sclerotic osseous. Electronically Signed   By: Tish Frederickson M.D.   On: 07/30/2023 20:52   MR CERVICAL SPINE W WO CONTRAST  Result Date: 07/30/2023 CLINICAL DATA:  Abnormal CT. EXAM: MRI CERVICAL SPINE WITHOUT AND WITH CONTRAST TECHNIQUE: Multiplanar and multiecho pulse sequences of the cervical spine, to include the craniocervical junction and cervicothoracic junction, were obtained without and with intravenous contrast. CONTRAST:  7mL GADAVIST GADOBUTROL 1 MMOL/ML IV SOLN COMPARISON:  Cervical spine CT from yesterday FINDINGS: Alignment: Physiologic. Vertebrae: Infiltrating bone lesions seen at C2, C4, C5, C6, C7, T1, T2, and T3 vertebral bodies. Posterior element involvement at C4 and leftward at C5, C6. Known C4 pathologic fracture with advanced height loss. No extraosseous tumor infiltration or retropulsion. Cord: Normal signal and morphology Posterior Fossa, vertebral arteries, paraspinal tissues: No perispinal hematoma or spinal canal collection. Sizable left pleural effusion. Disc levels: No significant degenerative change. IMPRESSION: Numerous bone lesions compatible with metastatic disease. Known pathologic compression fracture at C4 with advanced height loss but no retropulsion or soft tissue tumor extension. Electronically Signed   By: Tiburcio Pea M.D.   On: 07/30/2023 06:11   CT CERVICAL SPINE W CONTRAST  Addendum Date: 07/29/2023   ADDENDUM REPORT: 07/29/2023 19:01 ADDENDUM: Findings discussed with Dr. Kandace Blitz via telephone at 6:58 p.m. Electronically Signed   By: Feliberto Harts M.D.   On: 07/29/2023 19:01   Result Date: 07/29/2023 CLINICAL DATA:  Metastatic disease evaluation EXAM: CT CERVICAL SPINE WITH CONTRAST TECHNIQUE:  Multidetector CT imaging of the cervical spine was performed during intravenous contrast administration. Multiplanar CT image reconstructions were also generated. RADIATION DOSE REDUCTION: This exam was performed according to the departmental dose-optimization program which includes automated exposure control, adjustment of the mA and/or kV according to patient size and/or use of iterative reconstruction technique. CONTRAST:  OMNIPAQUE IOHEXOL 350 MG/ML SOLN COMPARISON:  None Available. FINDINGS: Motion limited study. Alignment: Bony retropulsion at C4 is detailed below. Otherwise, no substantial sagittal subluxation. Skull base and vertebrae: C4 compression fracture with severe (approximately 80%) height loss and approximately 2 mm of bony retropulsion. Irregular lucency and sclerosis associated with vertebral body. Soft tissues and spinal canal: No prevertebral fluid or swelling. No visible canal hematoma. Disc levels: Mild-to-moderate multilevel degenerative change. The above retropulsion likely result in mild canal stenosis at C4. Upper chest: Please see same day CT of the chest for further evaluation. IMPRESSION: C4 compression fracture with severe (approximately 80%) height loss, 2 mm of bony retropulsion, and heterogeneous sclerosis/lucency. Findings are highly suspicious for a pathologic fracture. MRI of the cervical spine with contrast could further assess this level as well as better assess for additional lesions if clinically warranted. Electronically Signed: By: Feliberto Harts M.D. On: 07/29/2023 18:30   CT Angio Chest PE W and/or Wo Contrast  Result Date: 07/29/2023 CLINICAL DATA:  Pulmonary embolus suspected with high probability. Shortness of breath. Bronchoscopy 2 weeks ago. EXAM: CT ANGIOGRAPHY CHEST WITH CONTRAST TECHNIQUE: Multidetector CT imaging of the chest was performed using the standard protocol during bolus administration of intravenous contrast. Multiplanar CT image  reconstructions and MIPs were obtained to evaluate the vascular anatomy. RADIATION DOSE REDUCTION: This exam was performed according to the departmental dose-optimization program which includes automated exposure control, adjustment of the mA and/or  kV according to patient size and/or use of iterative reconstruction technique. CONTRAST:  OMNIPAQUE IOHEXOL 350 MG/ML SOLN COMPARISON:  Chest radiograph 07/29/2023.  CT chest 07/11/2023 FINDINGS: Cardiovascular: There is good opacification of the central and segmental pulmonary arteries. No focal filling defects. No evidence of significant pulmonary embolus. Normal heart size. No pericardial effusions. Normal caliber thoracic aorta. No aortic dissection. Mediastinum/Nodes: Thyroid gland is unremarkable. Esophagus is decompressed. Mediastinal lymphadenopathy. Largest right paratracheal nodes measure 2.2 cm diameter. Similar appearance to prior study. This may indicate metastatic disease. Lungs/Pleura: Large left and small right pleural effusions, progressing since the prior CT study. Diffuse interstitial and patchy alveolar infiltrates throughout the lungs, also progressing. Changes are most likely to represent edema or multifocal pneumonia. Aspiration could also have this appearance. Underlying primary or metastatic disease could also be considered. Upper Abdomen: Diffuse fatty infiltration of the liver. No focal lesions. Musculoskeletal: No chest wall abnormality. No acute or significant osseous findings. Review of the MIP images confirms the above findings. IMPRESSION: 1. No evidence of significant pulmonary embolus. 2. Persistent finding of mediastinal lymphadenopathy, unchanged. 3. Large left and small right pleural effusions are progressing since prior CT. 4. Diffuse interstitial and alveolar infiltrates throughout both lungs is also progressing. Changes may represent edema or multifocal pneumonia. Aspiration or underlying neoplasm could also be considered.  Electronically Signed   By: Burman Nieves M.D.   On: 07/29/2023 18:40   DG Chest 2 View  Result Date: 07/29/2023 CLINICAL DATA:  Wheezing and shortness of breath. Recent left-sided thoracentesis with evidence of malignant effusion. History of breast cancer. EXAM: CHEST - 2 VIEW COMPARISON:  Chest x-ray dated July 21, 2023. FINDINGS: The heart size and mediastinal contours are within normal limits. Fairly diffuse interstitial thickening is similar, with increasing irregular airspace opacities in the left lung. No pneumothorax or pleural effusion. No acute osseous abnormality. Similar exaggerated thoracic kyphosis. IMPRESSION: 1. 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 Superimposed pneumonia in the left lung is possible. Electronically Signed   By: Obie Dredge M.D.   On: 07/29/2023 14:01   DG Chest 2 View  Result Date: 07/27/2023 CLINICAL DATA:  Left-sided pleural effusion. EXAM: CHEST - 2 VIEW COMPARISON:  X-ray 07/18/2023.  CT 07/11/2023 FINDINGS: Underinflation. Diffuse interstitial changes, thickening, are becoming more prominent. No pneumothorax or effusion. Normal cardiopericardial silhouette. There is some increasing bandlike opacities in the midlung zones bilaterally as well as some nodularity towards the lingula. IMPRESSION: Increasing opacity seen in both lungs with a additional increased thickening of the interstitial changes. Please correlate with the history. Electronically Signed   By: Karen Kays M.D.   On: 07/27/2023 11:21   US THORACENTESIS ASP PLEURAL SPACE W/IMG GUIDE  Result Date: 07/18/2023 INDICATION: Patient with history of left breast cancer, dyspnea, left pleural effusion. Request received for diagnostic and therapeutic left thoracentesis. EXAM: ULTRASOUND GUIDED DIAGNOSTIC AND THERAPEUTIC LEFT THORACENTESIS  MEDICATIONS: 8 mL 1% lidocaine COMPLICATIONS: None immediate. PROCEDURE: An ultrasound guided thoracentesis was thoroughly discussed with the patient and questions answered. The benefits, risks, alternatives and complications were also discussed. The patient understands and wishes to proceed with the procedure. Written consent was obtained. Ultrasound was performed to localize and mark an adequate pocket of fluid in the left chest. The area was then prepped and draped in the normal sterile fashion. 1% Lidocaine was used for local anesthesia. Under ultrasound guidance a 6 Fr Safe-T-Centesis catheter  was introduced. Thoracentesis was performed. The catheter was removed and a dressing applied. FINDINGS: A total of approximately 600 cc of yellow fluid was removed. Samples were sent to the laboratory as requested by the clinical team. IMPRESSION: Successful ultrasound guided diagnostic and therapeutic left thoracentesis yielding 600 cc of pleural fluid. Performed by: Jeananne Rama, PA-C Electronically Signed   By: Irish Lack M.D.   On: 07/18/2023 15:19   DG Chest 1 View  Result Date: 07/18/2023 CLINICAL DATA:  S/P thoracentesis.  Shortness of breath. EXAM: CHEST  1 VIEW COMPARISON:  Chest x-ray 07/09/2023. FINDINGS: Small left pleural effusion. No visible pneumothorax. Similar patchy base. Similar cardiomediastinal silhouette. Elevated left hemidiaphragm. IMPRESSION: 1. Small left pleural effusion.  No visible pneumothorax. 2. Similar patchy left lung opacities. Electronically Signed   By: Feliberto Harts M.D.   On: 07/18/2023 11:47   CT Super D Chest Wo Contrast  Result Date: 07/11/2023 CLINICAL DATA:  Wheezing with shortness of breath for 1 month. Nonsmoker. EXAM: CT CHEST WITHOUT CONTRAST TECHNIQUE: Multidetector CT imaging of the chest was performed using thin slice collimation for electromagnetic bronchoscopy planning purposes, without intravenous contrast. RADIATION DOSE REDUCTION: This exam was  performed according to the departmental dose-optimization program which includes automated exposure control, adjustment of the mA and/or kV according to patient size and/or use of iterative reconstruction technique. COMPARISON:  Chest radiographs 07/09/2023. No other comparison studies. FINDINGS: Cardiovascular: No acute vascular findings are identified on noncontrast imaging. There is no evidence of significant atherosclerosis. The heart size is normal. There is no pericardial effusion. Mediastinum/Nodes: There are multiple enlarged mediastinal and hilar lymph nodes bilaterally, including a right paratracheal node measuring 1.8 cm short axis on image 36/2 and a subcarinal node measuring 1.7 cm on image 54/2. These lymph nodes are noncalcified. Possible mildly enlarged left internal mammary node measuring 8 mm on image 45/2. No enlarged axillary lymph nodes. The thyroid gland, trachea and esophagus demonstrate no significant findings. Lungs/Pleura: Small to moderate dependent left pleural effusion. No significant right pleural effusion. No pneumothorax. As seen on recent radiographs, there is diffuse central airway thickening with basilar predominant ill-defined peribronchovascular ground-glass nodularity in both lungs. There is a wedge-shaped area of confluence inferiorly in the lingula. There is compressive atelectasis in the left lower lobe. No dominant lung mass or other areas of consolidation identified. No honeycomb formation or significant traction bronchiectasis. Upper abdomen: No significant findings are seen in the visualized upper abdomen. There are no enlarged lymph nodes. Musculoskeletal/Chest wall: There is no chest wall mass or suspicious osseous finding. Heterogeneous sclerosis within the T9 vertebral body is nonspecific, although suggestive of a hemangioma. No aggressive osseous lesions are identified. Surgical clips are noted medially in the left breast. Unless specific follow-up recommendations  are mentioned in the findings or impression sections, no imaging follow-up of any mentioned incidental findings is recommended. IMPRESSION: 1. Imaging for bronchoscopy planning and guidance. 2. Diffuse central airway thickening with basilar predominant ill-defined peribronchovascular ground-glass nodularity in both lungs. There is a wedge-shaped area of confluence inferiorly in the lingula and a small to moderate left pleural effusion. These findings are nonspecific and could be secondary to atypical infection or an inflammatory process (such as sarcoidosis, collagen vascular disease and organizing pneumonia). Patient has a history of breast cancer, and metastatic disease is not completely excluded, although not favored. 3. Nonspecific mediastinal and hilar adenopathy. This could be reactive, although lymphoproliferative process/metastatic disease cannot be excluded. 4. Heterogeneous sclerosis within the T9 vertebral body is  nonspecific, although suggestive of a hemangioma. No aggressive osseous lesions identified. Electronically Signed   By: Carey Bullocks M.D.   On: 07/11/2023 16:43   DG Chest 2 View  Result Date: 07/09/2023 CLINICAL DATA:  Shortness of breath EXAM: CHEST - 2 VIEW COMPARISON:  None Available. FINDINGS: Normal cardiac and mediastinal contours. Patchy consolidative opacities throughout the left upper, mid and lower lung. Patchy opacities within the right mid lung. Small left pleural effusion. No definite pneumothorax. Thoracic spine degenerative changes. Surgical clips left breast. IMPRESSION: Patchy consolidative opacities throughout the left mid and lower lung and right mid lung concerning for infection. Small left pleural effusion. Underlying mass or malignant process not excluded. Given the complexity of findings, recommend further evaluation with chest CT. These results will be called to the ordering clinician or representative by the Radiologist Assistant, and communication documented in  the PACS or [employer]. Electronically Signed   By: Annia Belt M.D.   On: 07/09/2023 13:15   (Echo, Carotid, EGD, Colonoscopy, ERCP)    Subjective:  Patient seen resting in bed in no acute distress she is aware she has to follow-up with [facility] endoscopy tomorrow morning. Discharge Exam: Vitals:   08/02/23 2028 08/03/23 0437  BP: 124/82 124/76  Pulse: (!) 102 95  Resp: 20 18  Temp: 98 F (36.7 C) 98.4 F (36.9 C)  SpO2: 96% 90%   Vitals:   08/02/23 0910 08/02/23 1314 08/02/23 2028 08/03/23 0437  BP:  121/72 124/82 124/76  Pulse:  (!) 110 (!) 102 95  Resp:  20 20 18   Temp:  97.7 F (36.5 C) 98 F (36.7 C) 98.4 F (36.9 C)  TempSrc:  Oral Oral Oral  SpO2: (!) 85% 93% 96% 90%  Weight:      Height:        General: Pt is alert, awake, not in acute distress Cardiovascular: RRR, S1/S2 +, no rubs, no gallops Respiratory rhonchi bilaterally, no wheezing, no rhonchi Abdominal: Soft, NT, ND, bowel sounds + Extremities: no edema, no cyanosis    The results of significant diagnostics from this hospitalization (including imaging, microbiology, ancillary and laboratory) are listed below for reference.     Microbiology: Recent Results (from the past 240 hour(s))  SARS Coronavirus 2 by RT PCR (hospital order, performed in [facility] hospital lab) *cepheid single result test* Anterior Nasal Swab     Status: None   Collection Time: 07/29/23  3:45 PM   Specimen: Anterior Nasal Swab  Result Value Ref Range Status   SARS Coronavirus 2 by RT PCR NEGATIVE NEGATIVE Final    Comment: Performed at [facility], [address] [place], [place] [address]  Respiratory (~20 pathogens) panel by PCR     Status: None   Collection Time: 07/29/23  3:46 PM   Specimen: Nasopharyngeal Swab; Respiratory  Result Value Ref Range Status   Adenovirus NOT DETECTED NOT DETECTED Final   Coronavirus 229E NOT DETECTED NOT DETECTED Final    Comment: (NOTE) The Coronavirus on the Respiratory  Panel, DOES NOT test for the novel  Coronavirus (2019 nCoV)    Coronavirus HKU1 NOT DETECTED NOT DETECTED Final   Coronavirus NL63 NOT DETECTED NOT DETECTED Final   Coronavirus OC43 NOT DETECTED NOT DETECTED Final   Metapneumovirus NOT DETECTED NOT DETECTED Final   Rhinovirus / Enterovirus NOT DETECTED NOT DETECTED Final   Influenza A NOT DETECTED NOT DETECTED Final   Influenza B NOT DETECTED NOT DETECTED Final   Parainfluenza Virus 1  NOT DETECTED NOT DETECTED Final   Parainfluenza Virus 2 NOT DETECTED NOT DETECTED Final   Parainfluenza Virus 3 NOT DETECTED NOT DETECTED Final   Parainfluenza Virus 4 NOT DETECTED NOT DETECTED Final   Respiratory Syncytial Virus NOT DETECTED NOT DETECTED Final   Bordetella pertussis NOT DETECTED NOT DETECTED Final   Bordetella Parapertussis NOT DETECTED NOT DETECTED Final   Chlamydophila pneumoniae NOT DETECTED NOT DETECTED Final   Mycoplasma pneumoniae NOT DETECTED NOT DETECTED Final    Comment: Performed at [facility], [address] [place], [place] [address]     Labs: BNP (last 3 results) Recent Labs    07/29/23 1630  BNP 22.4   Basic Metabolic Panel: Recent Labs  Lab 07/29/23 1349 07/29/23 1547 07/30/23 0910 07/31/23 0832  NA 137  --  135 137  K 3.4*  --  3.6 3.7  CL 98  --  105 101  CO2 26  --  20* 26  GLUCOSE 92  --  111* 96  BUN 7  --  5* <5*  CREATININE 0.72 0.77 0.66 0.76  CALCIUM 9.3  --  8.7* 9.1  MG  --  1.9  --  2.0   Liver Function Tests: Recent Labs  Lab 07/29/23 1349 07/30/23 0910  AST 27 23  ALT 15 12  ALKPHOS 92 79  BILITOT 0.6 0.2*  PROT 7.1 6.3*  ALBUMIN 3.2* 2.9*   No results for input(s): "LIPASE", "AMYLASE" in the last 168 hours. No results for input(s): "AMMONIA" in the last 168 hours. CBC: Recent Labs  Lab 07/29/23 1349 07/29/23 1547 07/30/23 0910 07/31/23 0832  WBC 9.7 11.5* 9.1 9.1  HGB 12.9 14.1 12.4 13.0  HCT 39.9 44.0 38.1 39.8  MCV 93.0 96.1 96.0 94.5  PLT 418* 356 352 374    Cardiac Enzymes: No results for input(s): "CKTOTAL", "CKMB", "CKMBINDEX", "TROPONINI" in the last 168 hours. BNP: Invalid input(s): "POCBNP" CBG: No results for input(s): "GLUCAP" in the last 168 hours. D-Dimer No results for input(s): "DDIMER" in the last 72 hours. Hgb A1c No results for input(s): "HGBA1C" in the last 72 hours. Lipid Profile No results for input(s): "CHOL", "HDL", "LDLCALC", "TRIG", "CHOLHDL", "LDLDIRECT" in the last 72 hours. Thyroid function studies No results for input(s): "TSH", "T4TOTAL", "T3FREE", "THYROIDAB" in the last 72 hours.  Invalid input(s): "FREET3" Anemia work up No results for input(s): "VITAMINB12", "FOLATE", "FERRITIN", "TIBC", "IRON", "RETICCTPCT" in the last 72 hours. Urinalysis No results found for: "COLORURINE", "APPEARANCEUR", "LABSPEC", "PHURINE", "GLUCOSEU", "HGBUR", "BILIRUBINUR", "KETONESUR", "PROTEINUR", "UROBILINOGEN", "NITRITE", "LEUKOCYTESUR" Sepsis Labs Recent Labs  Lab 07/29/23 1349 07/29/23 1547 07/30/23 0910 07/31/23 0832  WBC 9.7 11.5* 9.1 9.1   Microbiology Recent Results (from the past 240 hour(s))  SARS Coronavirus 2 by RT PCR (hospital order, performed in [facility] hospital lab) *cepheid single result test* Anterior Nasal Swab     Status: None   Collection Time: 07/29/23  3:45 PM   Specimen: Anterior Nasal Swab  Result Value Ref Range Status   SARS Coronavirus 2 by RT PCR NEGATIVE NEGATIVE Final    Comment: Performed at [facility], [address], [place], [place] [address]  Respiratory (~20 pathogens) panel by PCR     Status: None   Collection Time: 07/29/23  3:46 PM   Specimen: Nasopharyngeal Swab; Respiratory  Result Value Ref Range Status   Adenovirus NOT DETECTED NOT DETECTED Final   Coronavirus 229E NOT DETECTED NOT DETECTED Final    Comment: (NOTE) The Coronavirus  on the Respiratory Panel, DOES NOT test for the novel  Coronavirus (2019 nCoV)    Coronavirus HKU1 NOT DETECTED NOT DETECTED  Final   Coronavirus NL63 NOT DETECTED NOT DETECTED Final   Coronavirus OC43 NOT DETECTED NOT DETECTED Final   Metapneumovirus NOT DETECTED NOT DETECTED Final   Rhinovirus / Enterovirus NOT DETECTED NOT DETECTED Final   Influenza A NOT DETECTED NOT DETECTED Final   Influenza B NOT DETECTED NOT DETECTED Final   Parainfluenza Virus 1 NOT DETECTED NOT DETECTED Final   Parainfluenza Virus 2 NOT DETECTED NOT DETECTED Final   Parainfluenza Virus 3 NOT DETECTED NOT DETECTED Final   Parainfluenza Virus 4 NOT DETECTED NOT DETECTED Final   Respiratory Syncytial Virus NOT DETECTED NOT DETECTED Final   Bordetella pertussis NOT DETECTED NOT DETECTED Final   Bordetella Parapertussis NOT DETECTED NOT DETECTED Final   Chlamydophila pneumoniae NOT DETECTED NOT DETECTED Final   Mycoplasma pneumoniae NOT DETECTED NOT DETECTED Final    Comment: Performed at [facility], [address] [place], [place] [address]     Time coordinating discharge: 38 minutes  SIGNED:  Alwyn Ren, MD  [facility] 08/04/2023, 4:53 PM

## 2023-08-04 NOTE — Telephone Encounter (Signed)
Spoke with the patient to let her know that her medication should be ready for pick up from [facility].  She verbalized understanding.  Radiation appointment time reviewed.  Lind Covert RN, BSN

## 2023-08-04 NOTE — Op Note (Signed)
Video Bronchoscopy with Endobronchial Ultrasound Procedure Note  Date of Operation: 08/04/2023  Pre-op Diagnosis: Mediastinal adenopathy, bilateral infiltrates, adenocarcinoma  Post-op Diagnosis: Same  Surgeon: Levy Pupa  Assistants: None  Anesthesia: General endotracheal anesthesia  Operation: Flexible video fiberoptic bronchoscopy with endobronchial ultrasound and biopsies.  Estimated Blood Loss: Minimal  Complications: None apparent  Indications and History: Sherri Schultz is a 57 y.o. female with new diagnosis of adenocarcinoma of unknown primary.  This patient diagnosed by left thoracentesis.  She has bilateral focal infiltrates and mediastinal adenopathy.  Recommendation made to obtain tissue for pathology review bronchoscopy with endobronchial ultrasound.  The risks, benefits, complications, treatment options and expected outcomes were discussed with the patient.  The possibilities of pneumothorax, pneumonia, reaction to medication, pulmonary aspiration, perforation of a viscus, bleeding, failure to diagnose a condition and creating a complication requiring transfusion or operation were discussed with the patient who freely signed the consent.    Description of Procedure: The patient was examined in the preoperative area and history and data from the preprocedure consultation were reviewed. It was deemed appropriate to proceed.  The patient was taken to [facility] endoscopy room 3, identified as Sherri Schultz and the procedure verified as Flexible Video Fiberoptic Bronchoscopy.  A Time Out was held and the above information confirmed. After being taken to the operating room general anesthesia was initiated and the patient  was orally intubated. The video fiberoptic bronchoscope was introduced via the endotracheal tube and a general inspection was performed which showed normal airways on the right, some more edematous and erythematous airways on the left  especially the left lower lobe orifice.  There was some irregularity without an overt endobronchial lesion.  The standard scope was then withdrawn and the endobronchial ultrasound was used to identify and characterize the peritracheal, hilar and bronchial lymph nodes. Inspection showed enlargement at station 4R, station 7. Using real-time ultrasound guidance Wang needle biopsies were take from Station 4R and 7 nodes and were sent for cytology. The patient tolerated the procedure well without apparent complications. There was no significant blood loss. The bronchoscope was withdrawn. Anesthesia was reversed and the patient was taken to the PACU for recovery.   Samples: 1. Wang needle biopsies from 4R node 2. Wang needle biopsies from 7 node   Plans:  The patient will be discharged from the PACU to home when recovered from anesthesia. We will review the cytology, pathology and microbiology results with the patient when they become available. Outpatient followup will be with Dr. Delton Coombes, Dr. Mitzi Hansen and Dr. Al Pimple.    Levy Pupa, MD, PhD 08/04/2023, 3:15 PM [facility] [phone] or if no answer before 7:00PM call [phone] For any issues after 7:00PM please call eLink [phone]

## 2023-08-04 NOTE — Transfer of Care (Signed)
Immediate Anesthesia Transfer of Care Note  Patient: Sherri Schultz  Procedure(s) Performed: VIDEO BRONCHOSCOPY WITH FLUORO VIDEO BRONCHOSCOPY WITH ENDOBRONCHIAL ULTRASOUND BRONCHIAL NEEDLE ASPIRATION BIOPSIES  Patient Location: PACU  Anesthesia Type:General  Level of Consciousness: awake, alert , and oriented  Airway & Oxygen Therapy: Patient Spontanous Breathing and Patient connected to face mask oxygen  Post-op Assessment: Report given to RN and Post -op Vital signs reviewed and stable  Post vital signs: Reviewed and stable  Last Vitals:  Vitals Value Taken Time  BP 152/80 08/04/23 1526  Temp    Pulse 112 08/04/23 1530  Resp 26 08/04/23 1530  SpO2 90 % 08/04/23 1530  Vitals shown include unfiled device data.  Last Pain:  Vitals:   08/04/23 1200  PainSc: 6          Complications: No notable events documented.

## 2023-08-05 ENCOUNTER — Ambulatory Visit: Payer: BC Managed Care – PPO

## 2023-08-05 NOTE — Anesthesia Postprocedure Evaluation (Signed)
Anesthesia Post Note  Patient: Sherri Schultz  Procedure(s) Performed: VIDEO BRONCHOSCOPY WITH FLUORO VIDEO BRONCHOSCOPY WITH ENDOBRONCHIAL ULTRASOUND BRONCHIAL NEEDLE ASPIRATION BIOPSIES     Patient location during evaluation: PACU Anesthesia Type: General Level of consciousness: awake and alert Pain management: pain level controlled Vital Signs Assessment: post-procedure vital signs reviewed and stable Respiratory status: spontaneous breathing, nonlabored ventilation, respiratory function stable and patient connected to nasal cannula oxygen Cardiovascular status: blood pressure returned to baseline and stable Postop Assessment: no apparent nausea or vomiting Anesthetic complications: no   No notable events documented.  Last Vitals:  Vitals:   08/04/23 1630 08/04/23 1645  BP: 125/81 128/77  Pulse: (!) 106 (!) 104  Resp: (!) 24 (!) 25  Temp:  36.8 C  SpO2: 95% 91%    Last Pain:  Vitals:   08/04/23 1630  PainSc: 4                  [place]

## 2023-08-06 ENCOUNTER — Other Ambulatory Visit: Payer: Self-pay

## 2023-08-06 ENCOUNTER — Ambulatory Visit
Admission: RE | Admit: 2023-08-06 | Discharge: 2023-08-06 | Disposition: A | Discharge status: Home / Self Care | Payer: BC Managed Care – PPO | Source: Ambulatory Visit | Attending: Radiation Oncology | Admitting: Radiation Oncology

## 2023-08-06 DIAGNOSIS — C782 Secondary malignant neoplasm of pleura: Secondary | ICD-10-CM | POA: Diagnosis not present

## 2023-08-06 DIAGNOSIS — R0603 Acute respiratory distress: Secondary | ICD-10-CM | POA: Diagnosis not present

## 2023-08-06 LAB — RAD ONC ARIA SESSION SUMMARY
Course Elapsed Days: 5
Plan Fractions Treated to Date: 4
Plan Prescribed Dose Per Fraction: 3 Gy
Plan Total Fractions Prescribed: 10
Plan Total Prescribed Dose: 30 Gy
Reference Point Dosage Given to Date: 12 Gy
Reference Point Session Dosage Given: 3 Gy
Session Number: 4

## 2023-08-06 NOTE — Telephone Encounter (Signed)
Patient states having shortness of breath. Patient phone number is [phone].

## 2023-08-07 ENCOUNTER — Ambulatory Visit
Admission: RE | Admit: 2023-08-07 | Discharge: 2023-08-07 | Disposition: A | Discharge status: Home / Self Care | Payer: BC Managed Care – PPO | Source: Ambulatory Visit | Attending: Radiation Oncology

## 2023-08-07 ENCOUNTER — Other Ambulatory Visit: Payer: Self-pay

## 2023-08-07 ENCOUNTER — Encounter: Payer: Self-pay | Admitting: Emergency Medicine

## 2023-08-07 ENCOUNTER — Telehealth (HOSPITAL_BASED_OUTPATIENT_CLINIC_OR_DEPARTMENT_OTHER): Payer: Self-pay

## 2023-08-07 LAB — RAD ONC ARIA SESSION SUMMARY
Course Elapsed Days: 6
Plan Fractions Treated to Date: 5
Plan Prescribed Dose Per Fraction: 3 Gy
Plan Total Fractions Prescribed: 10
Plan Total Prescribed Dose: 30 Gy
Reference Point Dosage Given to Date: 15 Gy
Reference Point Session Dosage Given: 3 Gy
Session Number: 5

## 2023-08-07 NOTE — Telephone Encounter (Signed)
This is a duplicate encounter. I have spoken to patient and sent message to provider.

## 2023-08-07 NOTE — Telephone Encounter (Signed)
Patient has appt on 08/18/23

## 2023-08-07 NOTE — Telephone Encounter (Signed)
Discussed with Dr. Tonia Brooms.  Will work on getting this set up

## 2023-08-07 NOTE — Telephone Encounter (Signed)
Patient has called stating that her SOB was really bad yesterday. It is some better today but her oxygen levels are still at 80-87 majority of the time. She states that the provider had mentioned putting a tube in to help and she is wondering if this can or needs to be done. Please advise.

## 2023-08-08 ENCOUNTER — Emergency Department (HOSPITAL_COMMUNITY): Payer: BC Managed Care – PPO

## 2023-08-08 ENCOUNTER — Inpatient Hospital Stay (HOSPITAL_COMMUNITY): Payer: BC Managed Care – PPO

## 2023-08-08 ENCOUNTER — Other Ambulatory Visit: Payer: Self-pay

## 2023-08-08 ENCOUNTER — Inpatient Hospital Stay (HOSPITAL_COMMUNITY)
Admission: EM | Admit: 2023-08-08 | Discharge: 2023-08-26 | DRG: 208 | Disposition: E | Payer: BC Managed Care – PPO | Attending: Internal Medicine | Admitting: Internal Medicine

## 2023-08-08 ENCOUNTER — Ambulatory Visit: Admission: RE | Admit: 2023-08-08 | Payer: BC Managed Care – PPO | Source: Ambulatory Visit

## 2023-08-08 ENCOUNTER — Ambulatory Visit
Admission: RE | Admit: 2023-08-08 | Discharge: 2023-08-08 | Disposition: A | Discharge status: Home / Self Care | Payer: BC Managed Care – PPO | Source: Ambulatory Visit | Attending: Radiation Oncology | Admitting: Radiation Oncology

## 2023-08-08 ENCOUNTER — Encounter (HOSPITAL_COMMUNITY): Payer: Self-pay | Admitting: *Deleted

## 2023-08-08 DIAGNOSIS — J90 Pleural effusion, not elsewhere classified: Secondary | ICD-10-CM

## 2023-08-08 DIAGNOSIS — J910 Malignant pleural effusion: Secondary | ICD-10-CM | POA: Diagnosis present

## 2023-08-08 DIAGNOSIS — Z7189 Other specified counseling: Secondary | ICD-10-CM | POA: Diagnosis not present

## 2023-08-08 DIAGNOSIS — K5909 Other constipation: Secondary | ICD-10-CM | POA: Diagnosis present

## 2023-08-08 DIAGNOSIS — Z171 Estrogen receptor negative status [ER-]: Secondary | ICD-10-CM | POA: Diagnosis not present

## 2023-08-08 DIAGNOSIS — I10 Essential (primary) hypertension: Secondary | ICD-10-CM | POA: Diagnosis present

## 2023-08-08 DIAGNOSIS — J9811 Atelectasis: Secondary | ICD-10-CM | POA: Diagnosis present

## 2023-08-08 DIAGNOSIS — Z8489 Family history of other specified conditions: Secondary | ICD-10-CM

## 2023-08-08 DIAGNOSIS — C7951 Secondary malignant neoplasm of bone: Secondary | ICD-10-CM | POA: Diagnosis present

## 2023-08-08 DIAGNOSIS — Z1152 Encounter for screening for COVID-19: Secondary | ICD-10-CM | POA: Diagnosis not present

## 2023-08-08 DIAGNOSIS — D72829 Elevated white blood cell count, unspecified: Secondary | ICD-10-CM | POA: Diagnosis present

## 2023-08-08 DIAGNOSIS — Z515 Encounter for palliative care: Secondary | ICD-10-CM

## 2023-08-08 DIAGNOSIS — C782 Secondary malignant neoplasm of pleura: Principal | ICD-10-CM | POA: Diagnosis present

## 2023-08-08 DIAGNOSIS — C7801 Secondary malignant neoplasm of right lung: Secondary | ICD-10-CM | POA: Diagnosis not present

## 2023-08-08 DIAGNOSIS — J9601 Acute respiratory failure with hypoxia: Secondary | ICD-10-CM | POA: Diagnosis not present

## 2023-08-08 DIAGNOSIS — Z923 Personal history of irradiation: Secondary | ICD-10-CM

## 2023-08-08 DIAGNOSIS — C771 Secondary and unspecified malignant neoplasm of intrathoracic lymph nodes: Secondary | ICD-10-CM | POA: Diagnosis present

## 2023-08-08 DIAGNOSIS — R578 Other shock: Secondary | ICD-10-CM | POA: Diagnosis not present

## 2023-08-08 DIAGNOSIS — E876 Hypokalemia: Secondary | ICD-10-CM | POA: Diagnosis not present

## 2023-08-08 DIAGNOSIS — G4733 Obstructive sleep apnea (adult) (pediatric): Secondary | ICD-10-CM | POA: Diagnosis present

## 2023-08-08 DIAGNOSIS — Z833 Family history of diabetes mellitus: Secondary | ICD-10-CM

## 2023-08-08 DIAGNOSIS — D630 Anemia in neoplastic disease: Secondary | ICD-10-CM | POA: Diagnosis present

## 2023-08-08 DIAGNOSIS — Z85820 Personal history of malignant melanoma of skin: Secondary | ICD-10-CM

## 2023-08-08 DIAGNOSIS — R1310 Dysphagia, unspecified: Secondary | ICD-10-CM | POA: Diagnosis present

## 2023-08-08 DIAGNOSIS — R000 Tachycardia, unspecified: Secondary | ICD-10-CM | POA: Diagnosis present

## 2023-08-08 DIAGNOSIS — R0603 Acute respiratory distress: Secondary | ICD-10-CM | POA: Diagnosis present

## 2023-08-08 DIAGNOSIS — E871 Hypo-osmolality and hyponatremia: Secondary | ICD-10-CM | POA: Diagnosis present

## 2023-08-08 DIAGNOSIS — C7802 Secondary malignant neoplasm of left lung: Secondary | ICD-10-CM | POA: Diagnosis present

## 2023-08-08 DIAGNOSIS — M25512 Pain in left shoulder: Secondary | ICD-10-CM | POA: Diagnosis present

## 2023-08-08 DIAGNOSIS — Z9981 Dependence on supplemental oxygen: Secondary | ICD-10-CM

## 2023-08-08 DIAGNOSIS — G43909 Migraine, unspecified, not intractable, without status migrainosus: Secondary | ICD-10-CM | POA: Diagnosis present

## 2023-08-08 DIAGNOSIS — Z711 Person with feared health complaint in whom no diagnosis is made: Secondary | ICD-10-CM | POA: Diagnosis not present

## 2023-08-08 DIAGNOSIS — M25511 Pain in right shoulder: Secondary | ICD-10-CM | POA: Diagnosis present

## 2023-08-08 DIAGNOSIS — J9621 Acute and chronic respiratory failure with hypoxia: Secondary | ICD-10-CM | POA: Diagnosis present

## 2023-08-08 DIAGNOSIS — Z79899 Other long term (current) drug therapy: Secondary | ICD-10-CM

## 2023-08-08 DIAGNOSIS — M7989 Other specified soft tissue disorders: Secondary | ICD-10-CM | POA: Diagnosis not present

## 2023-08-08 DIAGNOSIS — Z8261 Family history of arthritis: Secondary | ICD-10-CM

## 2023-08-08 DIAGNOSIS — Z800 Family history of malignant neoplasm of digestive organs: Secondary | ICD-10-CM

## 2023-08-08 DIAGNOSIS — Z66 Do not resuscitate: Secondary | ICD-10-CM | POA: Diagnosis present

## 2023-08-08 DIAGNOSIS — K219 Gastro-esophageal reflux disease without esophagitis: Secondary | ICD-10-CM | POA: Diagnosis present

## 2023-08-08 DIAGNOSIS — Z803 Family history of malignant neoplasm of breast: Secondary | ICD-10-CM

## 2023-08-08 DIAGNOSIS — F419 Anxiety disorder, unspecified: Secondary | ICD-10-CM | POA: Diagnosis present

## 2023-08-08 DIAGNOSIS — Z8249 Family history of ischemic heart disease and other diseases of the circulatory system: Secondary | ICD-10-CM

## 2023-08-08 DIAGNOSIS — Z510 Encounter for antineoplastic radiation therapy: Secondary | ICD-10-CM

## 2023-08-08 DIAGNOSIS — C7800 Secondary malignant neoplasm of unspecified lung: Secondary | ICD-10-CM | POA: Diagnosis not present

## 2023-08-08 DIAGNOSIS — M4852XA Collapsed vertebra, not elsewhere classified, cervical region, initial encounter for fracture: Secondary | ICD-10-CM | POA: Diagnosis present

## 2023-08-08 DIAGNOSIS — R4589 Other symptoms and signs involving emotional state: Secondary | ICD-10-CM

## 2023-08-08 DIAGNOSIS — C799 Secondary malignant neoplasm of unspecified site: Secondary | ICD-10-CM | POA: Diagnosis not present

## 2023-08-08 DIAGNOSIS — C50919 Malignant neoplasm of unspecified site of unspecified female breast: Secondary | ICD-10-CM | POA: Diagnosis not present

## 2023-08-08 DIAGNOSIS — Z808 Family history of malignant neoplasm of other organs or systems: Secondary | ICD-10-CM

## 2023-08-08 DIAGNOSIS — D0510 Intraductal carcinoma in situ of unspecified breast: Secondary | ICD-10-CM | POA: Diagnosis present

## 2023-08-08 DIAGNOSIS — E861 Hypovolemia: Secondary | ICD-10-CM | POA: Diagnosis present

## 2023-08-08 DIAGNOSIS — T380X5A Adverse effect of glucocorticoids and synthetic analogues, initial encounter: Secondary | ICD-10-CM | POA: Diagnosis present

## 2023-08-08 HISTORY — PX: IR THORACENTESIS ASP PLEURAL SPACE W/IMG GUIDE: IMG5380

## 2023-08-08 LAB — CBC WITH DIFFERENTIAL/PLATELET
Abs Immature Granulocytes: 0.22 10*3/uL — ABNORMAL HIGH (ref 0.00–0.07)
Basophils Absolute: 0.1 10*3/uL (ref 0.0–0.1)
Basophils Relative: 1 %
Eosinophils Absolute: 0.5 10*3/uL (ref 0.0–0.5)
Eosinophils Relative: 4 %
HCT: 39.8 % (ref 36.0–46.0)
Hemoglobin: 12.8 g/dL (ref 12.0–15.0)
Immature Granulocytes: 2 %
Lymphocytes Relative: 5 %
Lymphs Abs: 0.7 10*3/uL (ref 0.7–4.0)
MCH: 30.3 pg (ref 26.0–34.0)
MCHC: 32.2 g/dL (ref 30.0–36.0)
MCV: 94.3 fL (ref 80.0–100.0)
Monocytes Absolute: 1.2 10*3/uL — ABNORMAL HIGH (ref 0.1–1.0)
Monocytes Relative: 9 %
Neutro Abs: 10.4 10*3/uL — ABNORMAL HIGH (ref 1.7–7.7)
Neutrophils Relative %: 79 %
Platelets: UNDETERMINED 10*3/uL (ref 150–400)
RBC: 4.22 MIL/uL (ref 3.87–5.11)
RDW: 12.7 % (ref 11.5–15.5)
WBC: 13 10*3/uL — ABNORMAL HIGH (ref 4.0–10.5)
nRBC: 0 % (ref 0.0–0.2)

## 2023-08-08 LAB — TROPONIN I (HIGH SENSITIVITY)
Troponin I (High Sensitivity): 4 ng/L (ref <18)
Troponin I (High Sensitivity): 5 ng/L (ref <18)

## 2023-08-08 LAB — BASIC METABOLIC PANEL
Anion gap: 14 (ref 5–15)
BUN: 10 mg/dL (ref 6–20)
CO2: 22 mmol/L (ref 22–32)
Calcium: 9 mg/dL (ref 8.9–10.3)
Chloride: 98 mmol/L (ref 98–111)
Creatinine, Ser: 0.46 mg/dL (ref 0.44–1.00)
GFR, Estimated: >60 mL/min (ref >60)
Glucose, Bld: 119 mg/dL — ABNORMAL HIGH (ref 70–99)
Potassium: 4 mmol/L (ref 3.5–5.1)
Sodium: 134 mmol/L — ABNORMAL LOW (ref 135–145)

## 2023-08-08 LAB — URINALYSIS, ROUTINE W REFLEX MICROSCOPIC
Bilirubin Urine: NEGATIVE
Glucose, UA: NEGATIVE mg/dL
Hgb urine dipstick: NEGATIVE
Ketones, ur: 20 mg/dL — AB
Nitrite: NEGATIVE
Protein, ur: 30 mg/dL — AB
Specific Gravity, Urine: 1.03 (ref 1.005–1.030)
pH: 5 (ref 5.0–8.0)

## 2023-08-08 LAB — BLOOD GAS, VENOUS
Acid-Base Excess: 5.1 mmol/L — ABNORMAL HIGH (ref 0.0–2.0)
Bicarbonate: 30.5 mmol/L — ABNORMAL HIGH (ref 20.0–28.0)
O2 Saturation: 79.5 %
Patient temperature: 37
pCO2, Ven: 47 mmHg (ref 44–60)
pH, Ven: 7.42 (ref 7.25–7.43)
pO2, Ven: 49 mmHg — ABNORMAL HIGH (ref 32–45)

## 2023-08-08 LAB — I-STAT CG4 LACTIC ACID, ED: Lactic Acid, Venous: 1.6 mmol/L (ref 0.5–1.9)

## 2023-08-08 LAB — BRAIN NATRIURETIC PEPTIDE: B Natriuretic Peptide: 19.5 pg/mL (ref 0.0–100.0)

## 2023-08-08 LAB — SARS CORONAVIRUS 2 BY RT PCR: SARS Coronavirus 2 by RT PCR: NEGATIVE

## 2023-08-08 MED ORDER — ALBUTEROL SULFATE (2.5 MG/3ML) 0.083% IN NEBU: 2.5000 mg | INHALATION_SOLUTION | RESPIRATORY_TRACT | Status: DC | PRN

## 2023-08-08 MED ORDER — ONDANSETRON HCL 4 MG/2ML IJ SOLN
4.0000 mg | Freq: Four times a day (QID) | INTRAMUSCULAR | Status: DC | PRN
  Administered 2023-08-08 – 2023-08-14 (×5): 4 mg via INTRAVENOUS
  Filled 2023-08-08 (×5): qty 2

## 2023-08-08 MED ORDER — ALBUTEROL SULFATE (2.5 MG/3ML) 0.083% IN NEBU: 2.5000 mg | INHALATION_SOLUTION | Freq: Four times a day (QID) | RESPIRATORY_TRACT | Status: DC

## 2023-08-08 MED ORDER — OXYCODONE HCL 5 MG PO TABS
10.0000 mg | ORAL_TABLET | ORAL | Status: DC | PRN
  Administered 2023-08-09 – 2023-08-17 (×15): 10 mg via ORAL
  Administered 2023-08-17: 5 mg via ORAL
  Administered 2023-08-18 – 2023-08-19 (×2): 10 mg via ORAL
  Filled 2023-08-08 (×20): qty 2

## 2023-08-08 MED ORDER — ONDANSETRON HCL 4 MG PO TABS: 4.0000 mg | ORAL_TABLET | Freq: Four times a day (QID) | ORAL | Status: DC | PRN

## 2023-08-08 MED ORDER — IPRATROPIUM-ALBUTEROL 0.5-2.5 (3) MG/3ML IN SOLN
3.0000 mL | Freq: Four times a day (QID) | RESPIRATORY_TRACT | Status: DC
  Administered 2023-08-08 – 2023-08-10 (×7): 3 mL via RESPIRATORY_TRACT
  Filled 2023-08-08 (×2): qty 3
  Filled 2023-08-08: qty 9
  Filled 2023-08-08 (×4): qty 3

## 2023-08-08 MED ORDER — PANTOPRAZOLE SODIUM 40 MG PO TBEC
40.0000 mg | DELAYED_RELEASE_TABLET | Freq: Every day | ORAL | Status: DC
  Administered 2023-08-08 – 2023-08-19 (×12): 40 mg via ORAL
  Filled 2023-08-08 (×12): qty 1

## 2023-08-08 MED ORDER — VENLAFAXINE HCL ER 150 MG PO CP24
150.0000 mg | ORAL_CAPSULE | Freq: Every day | ORAL | Status: DC
  Administered 2023-08-08 – 2023-08-19 (×11): 150 mg via ORAL
  Filled 2023-08-08 (×7): qty 1
  Filled 2023-08-08: qty 2
  Filled 2023-08-08 (×6): qty 1

## 2023-08-08 MED ORDER — ACETAMINOPHEN 650 MG RE SUPP: 650.0000 mg | Freq: Four times a day (QID) | RECTAL | Status: DC | PRN

## 2023-08-08 MED ORDER — POLYETHYLENE GLYCOL 3350 17 G PO PACK
17.0000 g | PACK | Freq: Two times a day (BID) | ORAL | Status: DC | PRN
  Administered 2023-08-15 – 2023-08-17 (×3): 17 g via ORAL
  Filled 2023-08-08 (×3): qty 1

## 2023-08-08 MED ORDER — SODIUM CHLORIDE 0.9 % IV SOLN: INTRAVENOUS | Status: AC

## 2023-08-08 MED ORDER — GUAIFENESIN ER 600 MG PO TB12
600.0000 mg | ORAL_TABLET | Freq: Two times a day (BID) | ORAL | Status: DC
  Administered 2023-08-08 – 2023-08-12 (×9): 600 mg via ORAL
  Filled 2023-08-08 (×10): qty 1

## 2023-08-08 MED ORDER — IPRATROPIUM BROMIDE 0.02 % IN SOLN: 0.5000 mg | Freq: Four times a day (QID) | RESPIRATORY_TRACT | Status: DC

## 2023-08-08 MED ORDER — TIZANIDINE HCL 4 MG PO TABS
4.0000 mg | ORAL_TABLET | Freq: Every evening | ORAL | Status: DC | PRN
  Administered 2023-08-11: 4 mg via ORAL
  Filled 2023-08-08: qty 1

## 2023-08-08 MED ORDER — PROPRANOLOL HCL ER 80 MG PO CP24
80.0000 mg | ORAL_CAPSULE | Freq: Every day | ORAL | Status: DC
  Administered 2023-08-09 – 2023-08-19 (×11): 80 mg via ORAL
  Filled 2023-08-08 (×13): qty 1

## 2023-08-08 MED ORDER — ACETAMINOPHEN 325 MG PO TABS
650.0000 mg | ORAL_TABLET | Freq: Four times a day (QID) | ORAL | Status: DC | PRN
  Administered 2023-08-10 – 2023-08-19 (×4): 650 mg via ORAL
  Filled 2023-08-08 (×4): qty 2

## 2023-08-08 MED ORDER — ENOXAPARIN SODIUM 40 MG/0.4ML IJ SOSY
40.0000 mg | PREFILLED_SYRINGE | INTRAMUSCULAR | Status: DC
  Administered 2023-08-08 – 2023-08-09 (×2): 40 mg via SUBCUTANEOUS
  Filled 2023-08-08 (×2): qty 0.4

## 2023-08-08 MED ORDER — MORPHINE SULFATE (PF) 2 MG/ML IV SOLN
2.0000 mg | INTRAVENOUS | Status: DC | PRN
  Administered 2023-08-08 – 2023-08-17 (×8): 2 mg via INTRAVENOUS
  Filled 2023-08-08 (×8): qty 1

## 2023-08-08 MED ORDER — LIDOCAINE HCL 1 % IJ SOLN
INTRAMUSCULAR | Status: AC
  Filled 2023-08-08: qty 20

## 2023-08-08 NOTE — Telephone Encounter (Signed)
I spoke with the patient, she is a at radiation oncology now waiting for her XRT.  Based on her level of dyspnea, the quick reaccumulation of her pleural fluid, I do not think she can wait for [place] to arrange for an outpatient Pleurx.  I think she will need to be admitted to manage the pleural space at least with a Thora, then possibly a Pleurx.  I recommended that she go to the ED, discussed with the radiation oncology clinical team.

## 2023-08-08 NOTE — Telephone Encounter (Signed)
Patient states that she needs this done today. If this can't be done today she wants to know what to do she cannot breathe like this much longer.

## 2023-08-08 NOTE — ED Provider Notes (Signed)
[place] EMERG[facility] DE[facility] AT [facility] Provider Note   CSN: [id number] Arrival date & time: 08/06/2023  1239     History  Chief Complaint  Patient presents with   Shortness of Breath    Sherri Schultz is a 57 y.o. female with past medical history significant for OSA, left breast cancer, history of migraines, right leg melanoma, hypertension, and anxiety who presents to the ED due to worsening shortness of breath x 3 to 4 days.  Patient had a thoracentesis on 9/5 draining 1L. Previously, she had a thoracentesis on 8/23 yielding 600 mL of yellow fluid which was positive for adenocarcinoma.  Patient states after her most recent thoracentesis her shortness of breath improved however, worsened over the past 4 days.  Also admits to a cough.  Some pressure in her chest which she attributes to coughing. Patient on 2L [place] chronically, has had to increase to 3L over the past few days. Was supposed to have radiation today and sent to the ED for evaluation of SOB. No history of blood clots. No lower extremity edema. Had nausea earlier today. No vomiting or diarrhea. Denies fever and chills.   History obtained from patient and past medical records. No interpreter used during encounter.       Home Medications Prior to Admission medications   Medication Sig Start Date End Date Taking? Authorizing Provider  botulinum toxin Type A (BOTOX) 200 units injection Inject 200 Units into the muscle every 3 (three) months. 06/05/22  Yes [provider]  nitroGLYCERIN (NITROSTAT) 0.4 MG SL tablet Place 0.4 mg under the tongue every 5 (five) minutes as needed for chest pain. 07/08/23  Yes [provider]  omeprazole (PRILOSEC) 40 MG capsule Take 40 mg by mouth daily as needed (Indigestion). 07/20/20  Yes [provider]  ondansetron (ZOFRAN) 4 MG tablet Take 1 tablet (4 mg total) by mouth every 6 (six) hours as needed for nausea. 08/03/23  Yes Alwyn Ren, MD   oxyCODONE 10 MG TABS Take 1 tablet (10 mg total) by mouth every 4 (four) hours as needed for severe pain. 08/03/23  Yes Alwyn Ren, MD  polyethylene glycol (MIRALAX / GLYCOLAX) 17 g packet Take 17 g by mouth 2 (two) times daily. Patient taking differently: Take 17 g by mouth 2 (two) times daily as needed for moderate constipation. 08/03/23  Yes Alwyn Ren, MD  propranolol ER (INDERAL LA) 80 MG 24 hr capsule Take 80 mg by mouth daily.   Yes [provider]  Rimegepant Sulfate (NURTEC) 75 MG TBDP Take by mouth.   Yes [provider]  tiZANidine (ZANAFLEX) 4 MG capsule Take 2 mg by mouth at bedtime as needed for muscle spasms.   Yes [provider]  valACYclovir (VALTREX) 1000 MG tablet Take 2 tablets by mouth 2 (two) times daily as needed ([place]res). 02/04/18  Yes [provider]  venlafaxine XR (EFFEXOR-XR) 150 MG 24 hr capsule Take 1 capsule by mouth daily. 03/08/18  Yes [provider]  senna-docusate (SENOKOT-S) 8.6-50 MG tablet Take 2 tablets by mouth 2 (two) times daily. Patient not taking: Reported on 07/27/2023 08/03/23   Alwyn Ren, MD      Allergies    Patient has no known allergies.    Review of Systems   Review of Systems  Constitutional:  Negative for fever.  Respiratory:  Positive for cough and shortness of breath.   Cardiovascular:  Positive for chest pain. Negative for  leg swelling.    Physical Exam Updated Vital Signs BP 123/81   Pulse (!) 125   Temp 100.1 F (37.8 C) (Rectal)   Resp (!) 22   Ht 5' 4.5" (1.638 m)   Wt 78.4 kg   LMP 03/27/2011   SpO2 93%   BMI 29.22 kg/m  Physical Exam Vitals and nursing note reviewed.  Constitutional:      General: She is not in acute distress.    Appearance: She is not ill-appearing.  HENT:     Head: Normocephalic.  Eyes:     Pupils: Pupils are equal, round, and reactive to light.  Cardiovascular:     Rate and Rhythm: Normal rate and regular rhythm.      Pulses: Normal pulses.     Heart sounds: Normal heart sounds. No murmur heard.    No friction rub. No gallop.  Pulmonary:     Effort: Pulmonary effort is normal.     Breath sounds: Normal breath sounds.     Comments: Hypoxic at 3L [place], placed on NRB. Decreased lung sounds on the left. Abdominal:     General: Abdomen is flat. There is no distension.     Palpations: Abdomen is soft.     Tenderness: There is no abdominal tenderness. There is no guarding or rebound.  Musculoskeletal:        General: Normal range of motion.     Cervical back: Neck supple.  Skin:    General: Skin is warm and dry.  Neurological:     General: No focal deficit present.     Mental Status: She is alert.  Psychiatric:        Mood and Affect: Mood normal.        Behavior: Behavior normal.     ED Results / Procedures / Treatments   Labs (all labs ordered are listed, but only abnormal results are displayed) Labs Reviewed  CBC WITH DIFFERENTIAL/PLATELET - Abnormal; Notable for the following components:      Result Value   WBC 13.0 (*)    Neutro Abs 10.4 (*)    Monocytes Absolute 1.2 (*)    Abs Immature Granulocytes 0.22 (*)    All other components within normal limits  BASIC METABOLIC PANEL - Abnormal; Notable for the following components:   Sodium 134 (*)    Glucose, Bld 119 (*)    All other components within normal limits  BLOOD GAS, VENOUS - Abnormal; Notable for the following components:   pO2, Ven 49 (*)    Bicarbonate 30.5 (*)    Acid-Base Excess 5.1 (*)    All other components within normal limits  SARS CORONAVIRUS 2 BY RT PCR  I-STAT CG4 LACTIC ACID, ED  I-STAT CG4 LACTIC ACID, ED  TROPONIN I (HIGH SENSITIVITY)  TROPONIN I (HIGH SENSITIVITY)    EKG None  Radiology DG Chest Portable 1 View  Result Date: 08/13/2023 CLINICAL DATA:  Shortness of breath. EXAM: PORTABLE CHEST 1 VIEW COMPARISON:  Chest radiograph 08/04/2023 FINDINGS: The cardiomediastinal silhouette is unchanged with normal  heart size. Lung volumes remain low. Interstitial and airspace opacities throughout both lungs have not significantly changed. A moderate left pleural effusion may have mildly enlarged. No pneumothorax is identified. IMPRESSION: 1. Unchanged bilateral airspace disease. 2. Possible mild enlargement of the left pleural effusion. Electronically Signed   By: Sebastian Ache M.D.   On: 08/10/2023 15:35    Procedures .Critical Care  Performed by: Mannie Stabile, PA-C Authorized by: Mannie Stabile,  PA-C   Critical care provider statement:    Critical care time (minutes):  38   Critical care was necessary to treat or prevent imminent or life-threatening deterioration of the following conditions:  Respiratory failure   Critical care was time spent personally by me on the following activities:  Development of treatment plan with patient or surrogate, discussions with consultants, evaluation of patient's response to treatment, examination of patient, ordering and review of laboratory studies, ordering and review of radiographic studies, ordering and performing treatments and interventions, pulse oximetry, re-evaluation of patient's condition and review of old charts   I assumed direction of critical care for this patient from another provider in my specialty: no     Care discussed with: admitting provider       Medications Ordered in ED Medications - No data to display  ED Course/ Medical Decision Making/ A&P Clinical Course as of 2023-09-01 1600  Fri 09/01/2023  1310 Patient placed on NRB. Dr. Elayne Snare performed bedside US that showed possible pleural effusion. CXR called to expedite x-ray. [CA]  1346 pH, Ven: 7.42 [CA]  1452 Reassessed patient at bedside. Patient appears much more comfortable. Still on NRB.  [CA]  1523 Spoke to [place] with IR on the phone who is Production designer, theatre/television/film with IR. APP, Caryn Bee currently on the phone. She will let him know about need for thoracentesis. Orders placed.  [CA]     Clinical Course User Index [CA] Mannie Stabile, PA-C                                 Medical Decision Making Amount and/or Complexity of Data Reviewed External Data Reviewed: notes. Labs: ordered. Decision-making details documented in ED Course. Radiology: ordered and independent interpretation performed. Decision-making details documented in ED Course. ECG/medicine tests: ordered and independent interpretation performed. Decision-making details documented in ED Course.  Risk Decision regarding hospitalization.   This patient presents to the ED for concern of SOB, this involves an extensive number of treatment options, and is a complaint that carries with it a high risk of complications and morbidity.  The differential diagnosis includes pleural effusion, PE, PNA, ACS, etc  57 year old female presents to the ED due to worsening shortness of breath for the past 3 to 4 days.  Had a recent thoracentesis on 9/5 yielding 1 L fluid.  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  History of breast cancer.  Also endorses a cough and some chest pain.  No fever or chills.  No history of blood clots.  On 2 L nasal cannula chronically.  Upon arrival patient found to be hypoxic in the 80s on 3 L nasal cannula.  Patient placed on nonrebreather with improvement in O2 saturations.  Patient tachycardic in the 140s. Patient appears uncomfortable. Decreased breath sounds on the left. Dr. Elayne Snare performed bedside US which showed possible pleural effusions. CXR ordered. Routine labs. CTA chest on 9/3 negative for PE, low suspicion for PE. Suspect symptoms secondary to pleural effusion secondary to malignancy.   CBC significant for leukocytosis at 13.  Lactic acid normal.  Patient is afebrile.  Low suspicion for sepsis.  Suspect tachycardia and hypoxia likely secondary to pleural effusion on chest x-ray.  BMP significant for hyponatremia 134.  Normal renal function.  Troponin normal.   COVID-negative.  EKG demonstrates sinus tachycardia.  No signs of acute ischemia.  Chest x-ray personally reviewed and interpreted demonstrates left pleural effusion  possible larger than previous.  Lives at home Hx breast cancer Has PCP  4:00 PM Discussed with Dr. Jonathon Bellows with [facility] who agrees to admit the patient. Will discuss with critical care per request.  Discussed with Critical care who will see patient once she moves to floor. He will touch base with hospitalist.   Discussed with Dr. Elayne Snare who evaluated patient at bedside and agrees with assessment and plan.        Final Clinical Impression(s) / ED Diagnoses Final diagnoses:  Acute respiratory failure with hypoxia (HCC)  Pleural effusion    Rx / DC Orders ED Discharge Orders     None         Mannie Stabile, PA-C 08/23/2023 1600    Royanne Foots, DO 08/14/23 1623

## 2023-08-08 NOTE — H&P (Addendum)
History and Physical    Sherri Schultz MWN:[id number] DOB: 12-01-1965 DOA: Aug 25, 2023  PCP: Elizabeth Palau, FNP   Patient coming from: home  Chief Complaint  Patient presents with   Shortness of Breath      HPI: 57 year old female with significant history of OSA, left breast cancer, migraine, right leg melanoma, HTN, anxiety history malignant pleural effusion w/ ACC unknown primary, hx of lung cancer presented to the ED with shortness of breath x 3 days 4 days.She is having worsening of shortness of breath over last 3 to 4 days, with associated cough, chest pressure, needing oxygen up to 3 L, normally on 2 L.She has dry cough, left sided pleuritic chest pain and nauseas,low grade temp but no fever. 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 She recently had bronchoscopy 9/9:Cytology showed malignant cells, cell origin are pending.  She is followed by Dr. Delton Coombes was recommending possible Pleurx placement after thoracentesis if she gets admitted.  In the ED: vitals:BP stable but with significant tachycardia -140s, RR 30s, 87% on 3l, Temp 100.1,>placed on NRB S/P bedside US showed left pleural effusion CXR>>Showed unchanged bilateral airspace disease, possible mild enlargement of moderate left pleural effusion Labs:leukocytosis 13.0, mild hyponatremia 134 troponin 4>5 lactic acid 1.6. COVID-negative.  VBG:pO2  49 pCO2 47. IR has been consulted, in the interim PCCM consulted as well. Patient got thoracentesis done and already feeling better almost 50% better.   Assessment/Plan Principal Problem:   Acute respiratory distress  Acute hypoxic respiratory failure with respiratory distress Recurrent left sided malignant pleural effusion: PCCM and IR consulted to S/P thoracentesis with 710 cc removal, repeat chest x-ray pending, postprocedure weaned off nonrebreather to nasal cannula 6l She will likely need Pleurx catheter placement as this is her third thoracentesis-await pulmonary plan.WE will admit to stepdown unit with PCCM consultation. Check bnp. HsTroponins are negative x 2.   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  Continue her pain management.   Mild leukocytosis: Tmax 100.1, no pneumonia on chest x-ray.  Check UA.Monitor   Anxiety disorder Continue venlafaxine   GERD: Continue PPI.   History of DCIS of left breast: She has been off tamoxifen, followed by Dr. Al Pimple   OSA: She was supposed to get CPAP after her diagnosis in June, but has not been approved yer- her PCCM working on it as per her.   Body mass index is 29.22 kg/m.   Severity of Illness: The appropriate patient status for this patient is INPATIENT. Inpatient status is judged to be reasonable and necessary in order to provide the required intensity of service to ensure the patient's safety. The patient's presenting symptoms, physical exam findings, and initial radiographic and laboratory data in the context of their chronic comorbidities is felt to  place them at high risk for further clinical deterioration.  Furthermore, it is not anticipated that the patient will be medically stable for discharge from the hospital within 2 midnights of admission.   * I certify that at the point of admission it is my clinical judgment that the patient will require inpatient hospital care spanning beyond 2 midnights from the point of admission due to high intensity of service, high risk for further deterioration and high frequency of surveillance required.*   DVT prophylaxis: enoxaparin (LOVENOX) injection 40 mg Start: 08/12/2023 2200 Code Status:   Code Status: Full Code  Family Communication: Admission, patients condition and plan of care including tests being ordered have been discussed with the patient and husband who indicate understanding and agree with the plan and Code Status.  Consults called:  PCCM IR  Review of Systems: All systems were reviewed and were negative except as mentioned in HPI above. Negative for focal weakness Negative for diarrhea  Past Medical History:  Diagnosis Date   Anxiety    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   Cancer (HCC) 2013   right knee   Dyspnea    Family history of brain cancer    Family history of breast cancer    Family history of colon cancer    Family history of melanoma    GERD (gastroesophageal reflux disease)    Headache    Hypertension    Personal history of radiation therapy    Sleep apnea    Trimalleolar fracture of ankle, closed, left, initial encounter     Past Surgical History:  Procedure Laterality Date   BREAST BIOPSY Right 10+ yrs ago   benign   BREAST BIOPSY Right 01/21/2020   x2   BREAST BIOPSY Left 12/14/2019   x2   BREAST CYST ASPIRATION Right 03/12/2021   BREAST EXCISIONAL BIOPSY Right 02/15/2020   BREAST LUMPECTOMY Left 02/15/2020   BREAST LUMPECTOMY WITH RADIOACTIVE SEED LOCALIZATION Left 02/15/2020   Procedure: LEFT BREAST LUMPECTOMY WITH RADIOACTIVE SEED LOCALIZATION;  Surgeon: Almond Lint, MD;  Location: [facility];  Service: General;  Laterality: Left;   IR THORACENTESIS ASP PLEURAL SPACE W/IMG GUIDE  08/01/2023   MELANOMA EXCISION Right 2013   knee   ORIF ANKLE FRACTURE Left 05/06/2018   Procedure: OPEN REDUCTION INTERNAL FIXATION (ORIF) LEFT TRIMALLEOLAR ANKLE FRACTURE;  Surgeon: Tarry Kos, MD;  Location: [facility];  Service: Orthopedics;  Laterality: Left;   RADIOACTIVE SEED GUIDED EXCISIONAL BREAST BIOPSY Right 02/15/2020   Procedure: RIGHT BREAST RADIOACTIVE SEED LOCALIZATION EXCISIONAL BIOPSY X 2;  Surgeon: Almond Lint, MD;  Location: [facility];  Service: General;  Laterality: Right;     reports that she has never smoked. She has never used smokeless tobacco. She reports that she does not drink alcohol and does not use drugs.  No Known Allergies  Family History  Problem Relation Age of Onset   Breast cancer Mother 86   Diabetes Mother    Colon cancer Mother 28   Breast cancer Maternal Aunt        dx. >50   Diabetes Father    Hypertension Father    Heart attack Father    Arthritis Father    Diabetes Brother    Melanoma Brother 90   Diabetes Brother    Breast cancer Cousin        dx. 40s/50s   Brain cancer Maternal Uncle  dx. 20s   Breast cancer Maternal Aunt        dx. >50   Breast cancer Maternal Aunt        dx. >50   Cancer Maternal Uncle        unknown type, dx. >50   Breast cancer Cousin        dx. 40s/50s     Prior to Admission medications   Medication Sig Start Date End Date Taking? Authorizing Provider  botulinum toxin Type A (BOTOX) 200 units injection Inject 200 Units into the muscle every 3 (three) months. 06/05/22  Yes [provider]  nitroGLYCERIN (NITROSTAT) 0.4 MG SL tablet Place 0.4 mg under the tongue every 5 (five) minutes as needed for chest pain. 07/08/23  Yes [provider]  omeprazole (PRILOSEC) 40 MG capsule Take 40 mg by mouth daily as needed (Indigestion). 07/20/20  Yes  [provider]  ondansetron (ZOFRAN) 4 MG tablet Take 1 tablet (4 mg total) by mouth every 6 (six) hours as needed for nausea. 08/03/23  Yes Alwyn Ren, MD  oxyCODONE 10 MG TABS Take 1 tablet (10 mg total) by mouth every 4 (four) hours as needed for severe pain. 08/03/23  Yes Alwyn Ren, MD  polyethylene glycol (MIRALAX / GLYCOLAX) 17 g packet Take 17 g by mouth 2 (two) times daily. Patient taking differently: Take 17 g by mouth 2 (two) times daily as needed for moderate constipation. 08/03/23  Yes Alwyn Ren, MD  propranolol ER (INDERAL LA) 80 MG 24 hr capsule Take 80 mg by mouth daily.   Yes [provider]  Rimegepant Sulfate (NURTEC) 75 MG TBDP Take by mouth.   Yes [provider]  tiZANidine (ZANAFLEX) 4 MG capsule Take 2 mg by mouth at bedtime as needed for muscle spasms.   Yes [provider]  valACYclovir (VALTREX) 1000 MG tablet Take 2 tablets by mouth 2 (two) times daily as needed ([place]). 02/04/18  Yes [provider]  venlafaxine XR (EFFEXOR-XR) 150 MG 24 hr capsule Take 1 capsule by mouth daily. 03/08/18  Yes [provider]  senna-docusate (SENOKOT-S) 8.6-50 MG tablet Take 2 tablets by mouth 2 (two) times daily. Patient not taking: Reported on 08/03/2023 08/03/23   Alwyn Ren, MD    Physical Exam: Vitals:   08/15/2023 1500 08/10/2023 1630 08/06/2023 1645 08/21/2023 1650  BP: 123/81 124/72 112/78   Pulse: (!) 125 (!) 121 (!) 126 (!) 128  Resp: (!) 22  (!) 23 (!) 28  Temp:      TempSrc:      SpO2: 93% 96% 94% 95%  Weight:      Height:        General exam: AAOx3 able to speak in full sentence post thoro HEENT:Oral mucosa moist, Ear/Nose WNL grossly, dentition normal. Respiratory system: bilaterally crackles mid to lower lungs Cardiovascular system: S1 & S2 +, No JVD,. Gastrointestinal system: Abdomen soft, NT,ND, BS+ Nervous System:Alert, awake, moving extremities and grossly nonfocal Extremities:  No edema, distal peripheral pulses palpable.  Skin: No rashes,no icterus. MSK: Normal muscle bulk,tone, power   Labs on Admission: I have personally reviewed following labs and imaging studies  CBC: Recent Labs  Lab 08/02/2023 1337  WBC 13.0*  NEUTROABS 10.4*  HGB 12.8  HCT 39.8  MCV 94.3  PLT PLATELET CLUMPS NOTED ON SMEAR, UNABLE TO ESTIMATE  Basic Metabolic Panel: Recent Labs  Lab 07/28/2023 1337  NA 134*  K 4.0  CL 98  CO2  22  GLUCOSE 119*  BUN 10  CREATININE 0.46  CALCIUM 9.0   GFR: Estimated Creatinine Clearance: 80.4 mL/min (by C-G formula based on SCr of 0.46 mg/dL). Radiological Exams on Admission: IR THORACENTESIS ASP PLEURAL SPACE W/IMG GUIDE  Result Date: 07/31/2023 INDICATION: Patient with history of left breast cancer, right leg melanoma, recurrent malignant, symptomatic left pleural effusion; request received for therapeutic left thoracentesis EXAM: ULTRASOUND GUIDED THERAPEUTIC LEFT THORACENTESIS MEDICATIONS: 8 ml 1% lidocaine COMPLICATIONS: None immediate. PROCEDURE: An ultrasound guided thoracentesis was thoroughly discussed with the patient and questions answered. The benefits, risks, alternatives and complications were also discussed. The patient understands and wishes to proceed with the procedure. Written consent was obtained. Ultrasound was performed to localize and mark an adequate pocket of fluid in the left chest. The area was then prepped and draped in the normal sterile fashion. 1% Lidocaine was used for local anesthesia. Under ultrasound guidance a 6 Fr Safe-T-Centesis catheter was introduced. Thoracentesis was performed. The catheter was removed and a dressing applied. FINDINGS: A total of approximately 710 cc of hazy,amber fluid was removed. IMPRESSION: Successful ultrasound guided therapeutic left thoracentesis yielding 710 cc of pleural fluid. Performed by: Artemio Aly Electronically Signed   By: Marliss Coots M.D.   On: 08/10/2023 16:49   DG  Chest Portable 1 View  Result Date: 07/30/2023 CLINICAL DATA:  Shortness of breath. EXAM: PORTABLE CHEST 1 VIEW COMPARISON:  Chest radiograph 08/04/2023 FINDINGS: The cardiomediastinal silhouette is unchanged with normal heart size. Lung volumes remain low. Interstitial and airspace opacities throughout both lungs have not significantly changed. A moderate left pleural effusion may have mildly enlarged. No pneumothorax is identified. IMPRESSION: 1. Unchanged bilateral airspace disease. 2. Possible mild enlargement of the left pleural effusion. Electronically Signed   By: Sebastian Ache M.D.   On: 08/18/2023 15:35      Lanae Boast MD [facility]  If 7PM-7AM, please contact night-coverage [email or web address]  08/05/2023, 4:59 PM

## 2023-08-08 NOTE — ED Triage Notes (Signed)
Pt presents with [facility], has lung cancer and had to have fluid removed. Last time Friday ( removed) pt pale and shob. Sats 80 on room air, unable to speak in full sentences without great effort. Pt tachy 145 triage

## 2023-08-08 NOTE — Progress Notes (Signed)
NAME:  Sherri Schultz, MRN:  [id number], DOB:  17-Oct-1966, LOS: 0 ADMISSION DATE:  08/01/2023, CONSULTATION DATE:  08/03/2023 REFERRING MD:  TRH, CHIEF COMPLAINT:  doe   History of Present Illness:  57 y.o. woman with history malignant pleural effusion, chronic hypoxemia on 3 L nasal cannula at home presents with worsening dyspnea and hypoxemia.  Contacted pulmonary office.  Has been trying to set up outpatient Pleurx.  Worsening symptoms but unable to get Pleurx done in short notice.  Advised to go to ED.  Chest x-ray showed loculated left pleural effusion on my review interpretation.  As well as scattered multifocal infiltrates consistent with parenchymal lung disease, cancerous signs on my interpretation.  Underwent thoracentesis with IR.  Improvement in hypoxemia.  From nonrebreather down to 4 L at time of evaluation.  Sats high 90s.  Reports improved dyspnea as well.  Pertinent  Medical History  Malignant pleural effusion  Significant Hospital Events: Including procedures, antibiotic start and stop dates in addition to other pertinent events   9/13 admitted to the hospital with worsening dyspnea on exertion and hypoxemia with recurrent left pleural effusion, improvement in symptoms and hypoxemia after thoracentesis  Interim History / Subjective:    Objective   Blood pressure 120/77, pulse (!) 122, temperature 98.7 F (37.1 C), temperature source Oral, resp. rate (!) 24, height 5' 4.5" (1.638 m), weight 78.4 kg, last menstrual period 03/27/2011, SpO2 92%.       No intake or output data in the 24 hours ending 08/15/2023 1847 Filed Weights   08/05/2023 1245  Weight: 78.4 kg    Examination: General: Chronically ill appearing, lying in bed HENT: EOMI, moist mucous membranes Lungs: Normal breathing, on 4 L nasal cannula Cardiovascular: Regular rate and rhythm on monitor, no significant edema Abdomen: Nondistended Neuro: Awake, alert, moves all extremities  Resolved Hospital  Problem list     Assessment & Plan:  Recurrent left-sided effusion, malignant: Based on cytology 07/18/23 -- Status post thoracentesis -- Would likely benefit from Pleurx, drained frequently, will assess in a few days, will need fluid reaccumulate for procedure  Acute on chronic hypoxemic respiratory failure: Due to likely compressive atelectasis from effusion and shunt with inability to compensate given disease parenchyma. -- Improved after thoracentesis -- Oxygen supplementation, goal O2 sat 88% or greater   Best Practice (right click and "Reselect all SmartList Selections" daily)   Per primary  Labs   CBC: Recent Labs  Lab 08/19/2023 1337  WBC 13.0*  NEUTROABS 10.4*  HGB 12.8  HCT 39.8  MCV 94.3  PLT PLATELET CLUMPS NOTED ON SMEAR, UNABLE TO ESTIMATE    Basic Metabolic Panel: Recent Labs  Lab 08/18/2023 1337  NA 134*  K 4.0  CL 98  CO2 22  GLUCOSE 119*  BUN 10  CREATININE 0.46  CALCIUM 9.0   GFR: Estimated Creatinine Clearance: 80.4 mL/min (by C-G formula based on SCr of 0.46 mg/dL). Recent Labs  Lab 08/16/2023 1337 08/06/2023 1355  WBC 13.0*  --   LATICACIDVEN  --  1.6    Liver Function Tests: No results for input(s): "AST", "ALT", "ALKPHOS", "BILITOT", "PROT", "ALBUMIN" in the last 168 hours. No results for input(s): "LIPASE", "AMYLASE" in the last 168 hours. No results for input(s): "AMMONIA" in the last 168 hours.  ABG    Component Value Date/Time   HCO3 30.5 (H) 08/15/2023 1337   O2SAT 79.5 08/16/2023 1337     Coagulation Profile: No results for input(s): "INR", "PROTIME" in the  last 168 hours.  Cardiac Enzymes: No results for input(s): "CKTOTAL", "CKMB", "CKMBINDEX", "TROPONINI" in the last 168 hours.  HbA1C: No results found for: "HGBA1C"  CBG: No results for input(s): "GLUCAP" in the last 168 hours.  Review of Systems:   No orthopnea or PND.  No worsening swelling.  Comprehensive review of systems otherwise negative.  Past Medical  History:  She,  has a past medical history of Anxiety, Breast cancer (HCC) (2021), Cancer (HCC) (2013), Dyspnea, Family history of brain cancer, Family history of breast cancer, Family history of colon cancer, Family history of melanoma, GERD (gastroesophageal reflux disease), Headache, Hypertension, Personal history of radiation therapy, Sleep apnea, and Trimalleolar fracture of ankle, closed, left, initial encounter.   Surgical History:   Past Surgical History:  Procedure Laterality Date   BREAST BIOPSY Right 10+ yrs ago   benign   BREAST BIOPSY Right 01/21/2020   x2   BREAST BIOPSY Left 12/14/2019   x2   BREAST CYST ASPIRATION Right 03/12/2021   BREAST EXCISIONAL BIOPSY Right 02/15/2020   BREAST LUMPECTOMY Left 02/15/2020   BREAST LUMPECTOMY WITH RADIOACTIVE SEED LOCALIZATION Left 02/15/2020   Procedure: LEFT BREAST LUMPECTOMY WITH RADIOACTIVE SEED LOCALIZATION;  Surgeon: Almond Lint, MD;  Location: [facility];  Service: General;  Laterality: Left;   IR THORACENTESIS ASP PLEURAL SPACE W/IMG GUIDE  08/01/2023   MELANOMA EXCISION Right 2013   knee   ORIF ANKLE FRACTURE Left 05/06/2018   Procedure: OPEN REDUCTION INTERNAL FIXATION (ORIF) LEFT TRIMALLEOLAR ANKLE FRACTURE;  Surgeon: Tarry Kos, MD;  Location: [facility];  Service: Orthopedics;  Laterality: Left;   RADIOACTIVE SEED GUIDED EXCISIONAL BREAST BIOPSY Right 02/15/2020   Procedure: RIGHT BREAST RADIOACTIVE SEED LOCALIZATION EXCISIONAL BIOPSY X 2;  Surgeon: Almond Lint, MD;  Location: [facility];  Service: General;  Laterality: Right;     Social History:   reports that she has never smoked. She has never used smokeless tobacco. She reports that she does not drink alcohol and does not use drugs.   Family History:  Her family history includes Arthritis in her father; Brain cancer in her maternal uncle; Breast cancer in her cousin, cousin, maternal aunt, maternal aunt, and maternal  aunt; Breast cancer (age of onset: 19) in her mother; Cancer in her maternal uncle; Colon cancer (age of onset: 59) in her mother; Diabetes in her brother, brother, father, and mother; Heart attack in her father; Hypertension in her father; Melanoma (age of onset: 21) in her brother.   Allergies No Known Allergies   Home Medications  Prior to Admission medications   Medication Sig Start Date End Date Taking? Authorizing Provider  botulinum toxin Type A (BOTOX) 200 units injection Inject 200 Units into the muscle every 3 (three) months. 06/05/22  Yes [provider]  nitroGLYCERIN (NITROSTAT) 0.4 MG SL tablet Place 0.4 mg under the tongue every 5 (five) minutes as needed for chest pain. 07/08/23  Yes [provider]  omeprazole (PRILOSEC) 40 MG capsule Take 40 mg by mouth daily as needed (Indigestion). 07/20/20  Yes [provider]  ondansetron (ZOFRAN) 4 MG tablet Take 1 tablet (4 mg total) by mouth every 6 (six) hours as needed for nausea. 08/03/23  Yes Alwyn Ren, MD  oxyCODONE 10 MG TABS Take 1 tablet (10 mg total) by mouth every 4 (four) hours as needed for severe pain. 08/03/23  Yes Alwyn Ren, MD  polyethylene glycol (MIRALAX / GLYCOLAX) 17 g packet Take  17 g by mouth 2 (two) times daily. Patient taking differently: Take 17 g by mouth 2 (two) times daily as needed for moderate constipation. 08/03/23  Yes Alwyn Ren, MD  propranolol ER (INDERAL LA) 80 MG 24 hr capsule Take 80 mg by mouth daily.   Yes [provider]  Rimegepant Sulfate (NURTEC) 75 MG TBDP Take by mouth.   Yes [provider]  tiZANidine (ZANAFLEX) 4 MG capsule Take 2 mg by mouth at bedtime as needed for muscle spasms.   Yes [provider]  valACYclovir (VALTREX) 1000 MG tablet Take 2 tablets by mouth 2 (two) times daily as needed ([place]). 02/04/18  Yes [provider]  venlafaxine XR (EFFEXOR-XR) 150 MG 24 hr capsule Take 1 capsule by mouth  daily. 03/08/18  Yes [provider]  senna-docusate (SENOKOT-S) 8.6-50 MG tablet Take 2 tablets by mouth 2 (two) times daily. Patient not taking: Reported on 08/22/2023 08/03/23   Alwyn Ren, MD     Critical care time: n/a

## 2023-08-08 NOTE — Hospital Course (Addendum)
Sherri Schultz is a 57 y.o. female with a history of OSA,breast cancer, migraine headaches, right leg melanoma, anxiety, hypertension, malignant pleural effusion presented secondary to worsening shortness of breath and found to have recurrent left sided pleural effusion requiring thoracentesis on admission on 9/13 and admitted to stepdown unit.Some improvement immediately after thoracentesis with worsening dyspnea likely related to re-accumulation.  Pulmonary critical care following.  Patient with ongoing oxygen need, had worsening oxygen requirement 9/17 night 9/18 AM steroid trial per PCCM 9/20: Dr. Al Pimple from oncology following 9/23 Duplex nef for dvt b/l legs.

## 2023-08-08 NOTE — Progress Notes (Signed)
The proposed treatment discussed in conference is for discussion purpose only and is not a binding recommendation.  The patients have not been physically examined, or presented with their treatment options.  Therefore, final treatment plans cannot be decided.  

## 2023-08-08 NOTE — ED Notes (Deleted)
ED TO INPATIENT HANDOFF REPORT  Name/Age/Gender Sherri Schultz 57 y.o. female  Code Status Code Status History     Date Active Date Inactive Code Status Order ID Comments User Context   07/29/2023 1548 08/03/2023 2020 Full Code [id number]  Hughie Closs, MD ED    Questions for Most Recent Historical Code Status (Order [id number])     Question Answer   By: Consent: discussion documented in EHR            Home/SNF/Other Home  Chief Complaint o2 user shob  Level of Care/Admitting Diagnosis ED Disposition     ED Disposition  Admit   Condition  --   Comment  The patient appears reasonably stabilized for admission considering the current resources, flow, and capabilities available in the ED at this time, and I doubt any other [facility] requiring further screening and/or treatment in the ED prior to admission is  present.          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   Cancer (HCC) 2013   right knee   Dyspnea    Family history of brain cancer    Family history of breast cancer    Family history of colon cancer    Family history of melanoma    GERD (gastroesophageal reflux disease)    Headache    Hypertension    Personal history of radiation therapy    Sleep apnea    Trimalleolar fracture of ankle, closed, left, initial encounter     Allergies No Known Allergies  IV Location/Drains/Wounds Patient Lines/Drains/Airways Status     Active Line/Drains/Airways     Name Placement date Placement time Site Days   Peripheral IV 08/23/2023 20 G 1.88" Anterior;Left Forearm 08/15/2023  1340  Forearm  less than 1            Labs/Imaging Results for orders placed or performed during the hospital encounter of 08/10/2023 (from the past 48 hour(s))  CBC with Differential     Status: Abnormal   Collection Time: 08/09/2023  1:37 PM  Result Value Ref Range   WBC 13.0 (H) 4.0 - 10.5 K/uL   RBC  4.22 3.87 - 5.11 MIL/uL   Hemoglobin 12.8 12.0 - 15.0 g/dL   HCT 78.2 95.6 - 21.3 %   MCV 94.3 80.0 - 100.0 fL   MCH 30.3 26.0 - 34.0 pg   MCHC 32.2 30.0 - 36.0 g/dL   RDW 08.6 57.8 - 46.9 %   Platelets PLATELET CLUMPS NOTED ON SMEAR, UNABLE TO ESTIMATE 150 - 400 K/uL   nRBC 0.0 0.0 - 0.2 %   Neutrophils Relative % 79 %   Neutro Abs 10.4 (H) 1.7 - 7.7 K/uL   Lymphocytes Relative 5 %   Lymphs Abs 0.7 0.7 - 4.0 K/uL   Monocytes Relative 9 %   Monocytes Absolute 1.2 (H) 0.1 - 1.0 K/uL   Eosinophils Relative 4 %   Eosinophils Absolute 0.5 0.0 - 0.5 K/uL   Basophils Relative 1 %   Basophils Absolute 0.1 0.0 - 0.1 K/uL   Immature Granulocytes 2 %   Abs Immature Granulocytes 0.22 (H) 0.00 - 0.07 K/uL    Comment: Performed at [facility], [address], [place], [place] [address]  Basic metabolic panel     Status: Abnormal   Collection Time: 08/14/2023  1:37 PM  Result Value Ref Range  Sodium 134 (L) 135 - 145 mmol/L   Potassium 4.0 3.5 - 5.1 mmol/L   Chloride 98 98 - 111 mmol/L   CO2 22 22 - 32 mmol/L   Glucose, Bld 119 (H) 70 - 99 mg/dL    Comment: Glucose reference range applies only to samples taken after fasting for at least 8 hours.   BUN 10 6 - 20 mg/dL   Creatinine, Ser 1.61 0.44 - 1.00 mg/dL   Calcium 9.0 8.9 - 09.6 mg/dL   GFR, Estimated >04 >54 mL/min    Comment: (NOTE) Calculated using the CKD-EPI Creatinine Equation (2021)    Anion gap 14 5 - 15    Comment: Performed at [facility], [address], [place], [place] [address]  Troponin I (High Sensitivity)     Status: None   Collection Time: Sep 03, 2023  1:37 PM  Result Value Ref Range   Troponin I (High Sensitivity) 4 <18 ng/L    Comment: (NOTE) Elevated high sensitivity troponin I (hsTnI) values and significant  changes across serial measurements may suggest ACS but many other  chronic and acute conditions are known to elevate hsTnI results.  Refer to the "Links" section for  chest pain algorithms and additional  guidance. Performed at [facility], [address], [place], [place] [address]   Blood gas, venous (at [facility] and [facility])     Status: Abnormal   Collection Time: 2023/09/03  1:37 PM  Result Value Ref Range   pH, Ven 7.42 7.25 - 7.43   pCO2, Ven 47 44 - 60 mmHg   pO2, Ven 49 (H) 32 - 45 mmHg   Bicarbonate 30.5 (H) 20.0 - 28.0 mmol/L   Acid-Base Excess 5.1 (H) 0.0 - 2.0 mmol/L   O2 Saturation 79.5 %   Patient temperature 37.0     Comment: Performed at [facility], [address] [place], [place] [address]  SARS Coronavirus 2 by RT PCR (hospital order, performed in [facility] hospital lab) *cepheid single result test* Anterior Nasal Swab     Status: None   Collection Time: 09/03/23  1:48 PM   Specimen: Anterior Nasal Swab  Result Value Ref Range   SARS Coronavirus 2 by RT PCR NEGATIVE NEGATIVE    Comment: (NOTE) SARS-CoV-2 target nucleic acids are NOT DETECTED.  The SARS-CoV-2 RNA is generally detectable in upper and lower respiratory specimens during the acute phase of infection. The lowest concentration of SARS-CoV-2 viral copies this assay can detect is 250 copies / mL. A negative result does not preclude SARS-CoV-2 infection and should not be used as the sole basis for treatment or other patient management decisions.  A negative result may occur with improper specimen collection / handling, submission of specimen other than nasopharyngeal swab, presence of viral mutation(s) within the areas targeted by this assay, and inadequate number of viral copies (<250 copies / mL). A negative result must be combined with clinical observations, patient history, and epidemiological information.  Fact Sheet for Patients:   RoadLapTop.co.za  Fact Sheet for Healthcare Providers: http://kim-miller.com/  This test is not yet approved or  cleared by the [place] FDA and has  been authorized for detection and/or diagnosis of SARS-CoV-2 by FDA under an Emergency Use Authorization (EUA).  This EUA will remain in effect (meaning this test can be used) for the duration of the COVID-19 declaration under Section 564(b)(1) of the Act, 21 U.S.C. section 360bbb-3(b)(1), unless the authorization is terminated or revoked sooner.  Performed at [facility], [address], [place], [place] [address]   I-Stat CG4 Lactic Acid     Status: None   Collection Time: 08-30-2023  1:55 PM  Result Value Ref Range   Lactic Acid, Venous 1.6 0.5 - 1.9 mmol/L   No results found.  Pending Labs Unresulted Labs (From admission, onward)    None       Vitals/Pain Today's Vitals   August 30, 2023 1302 08/30/23 1305 30-Aug-2023 1400 2023/08/30 1404  BP:    126/73  Pulse: (!) 138 (!) 141 (!) 127 (!) 123  Resp: (!) 24 (!) 27 (!) 30 (!) 36  Temp:    100.1 F (37.8 C)  TempSrc:    Rectal  SpO2: (!) 88% 93% 99% 95%  Weight:      Height:      PainSc:        Isolation Precautions Airborne and Contact precautions  Medications

## 2023-08-08 NOTE — Telephone Encounter (Signed)
Please let her know it takes longer to schedule this as an outpatient and I cannot guarantee this can be done today.  Based on the fact that she is more short of breath, has progressive symptoms, I think she needs to go to the emergency department to be seen.  If she is admitted they can probably do repeat thoracentesis, talk about possible Pleurx placement.  Please advise her to go to the ED.

## 2023-08-08 NOTE — Procedures (Signed)
Ultrasound-guided  therapeutic left thoracentesis performed yielding 710 cc of hazy, amber  fluid. No immediate complications. Follow-up chest x-ray pending.EBL < 2 cc.

## 2023-08-09 DIAGNOSIS — J910 Malignant pleural effusion: Secondary | ICD-10-CM

## 2023-08-09 DIAGNOSIS — R0603 Acute respiratory distress: Secondary | ICD-10-CM | POA: Diagnosis not present

## 2023-08-09 DIAGNOSIS — J9621 Acute and chronic respiratory failure with hypoxia: Secondary | ICD-10-CM | POA: Diagnosis not present

## 2023-08-09 LAB — CBC
HCT: 37.7 % (ref 36.0–46.0)
Hemoglobin: 11.6 g/dL — ABNORMAL LOW (ref 12.0–15.0)
MCH: 29.6 pg (ref 26.0–34.0)
MCHC: 30.8 g/dL (ref 30.0–36.0)
MCV: 96.2 fL (ref 80.0–100.0)
Platelets: 290 10*3/uL (ref 150–400)
RBC: 3.92 MIL/uL (ref 3.87–5.11)
RDW: 13 % (ref 11.5–15.5)
WBC: 10.6 10*3/uL — ABNORMAL HIGH (ref 4.0–10.5)
nRBC: 0 % (ref 0.0–0.2)

## 2023-08-09 LAB — BASIC METABOLIC PANEL
Anion gap: 13 (ref 5–15)
BUN: 8 mg/dL (ref 6–20)
CO2: 25 mmol/L (ref 22–32)
Calcium: 8.7 mg/dL — ABNORMAL LOW (ref 8.9–10.3)
Chloride: 96 mmol/L — ABNORMAL LOW (ref 98–111)
Creatinine, Ser: 0.56 mg/dL (ref 0.44–1.00)
GFR, Estimated: >60 mL/min (ref >60)
Glucose, Bld: 98 mg/dL (ref 70–99)
Potassium: 3.4 mmol/L — ABNORMAL LOW (ref 3.5–5.1)
Sodium: 134 mmol/L — ABNORMAL LOW (ref 135–145)

## 2023-08-09 LAB — MRSA NEXT GEN BY PCR, NASAL: MRSA by PCR Next Gen: NOT DETECTED

## 2023-08-09 MED ORDER — ORAL CARE MOUTH RINSE
15.0000 mL | OROMUCOSAL | Status: DC
  Administered 2023-08-09 – 2023-08-11 (×5): 15 mL via OROMUCOSAL

## 2023-08-09 MED ORDER — FUROSEMIDE 10 MG/ML IJ SOLN
40.0000 mg | Freq: Once | INTRAMUSCULAR | Status: AC
  Administered 2023-08-09: 40 mg via INTRAVENOUS
  Filled 2023-08-09: qty 4

## 2023-08-09 MED ORDER — ORAL CARE MOUTH RINSE: 15.0000 mL | OROMUCOSAL | Status: DC | PRN

## 2023-08-09 MED ORDER — CHLORHEXIDINE GLUCONATE CLOTH 2 % EX PADS
6.0000 | MEDICATED_PAD | Freq: Every day | CUTANEOUS | Status: DC
  Administered 2023-08-09 – 2023-08-19 (×9): 6 via TOPICAL

## 2023-08-09 NOTE — Progress Notes (Signed)
eLink Physician-Brief Progress Note Patient Name: Sherri Schultz DOB: 02-16-1966 MRN: [id number]   Date of Service  08/09/2023  HPI/Events of Note  Patient admitted with acute on chronic hypoxemic respiratory failure secondary to malignant pleural effusion, she is s/p thoracentesis with improvement in symptoms.  eICU Interventions  New Patient Evaluation.        Sherri Schultz 08/09/2023, 4:21 AM

## 2023-08-09 NOTE — Plan of Care (Signed)
  Problem: Education: Goal: Knowledge of General Education information will improve Description Including pain rating scale, medication(s)/side effects and non-pharmacologic comfort measures Outcome: Progressing   Problem: Health Behavior/Discharge Planning: Goal: Ability to manage health-related needs will improve Outcome: Progressing   

## 2023-08-09 NOTE — Progress Notes (Signed)
PROGRESS NOTE    Sherri Schultz  ION:[id number] DOB: [birth year] DOA: 08/24/2023 PCP: Elizabeth Palau, FNP   Brief Narrative: Sherri Schultz is a 57 y.o. female with a history of OSA< breast cancer, migraine headaches, right leg melanoma, anxiety, hypertension, malignant pleural effusion.  Patient presented secondary to worsening shortness of breath and found to have recurrent left sided pleural effusion requiring thoracentesis.   Assessment and Plan:  Acute on chronic respiratory failure with hypoxia Secondary to recurrent pleural effusion. Patient uses 2-3 L/min at baseline, but has required non-rebreather on admission. She has weaned down to 5 L/min after thoracentesis. Still with significant dyspnea with mobility. -Management of pleural effusion -Wean to 3 L/min as able -Ambulatory pulse ox once able  Recurrent left-sided malignant pleural effusion Patient underwent successful left-sided thoracentesis on 9/13 yielding 0.7 liters of hazy/amber fluid. Discussed with PCCM who will perform a bedside ultrasound to reassess fluid re-accumulation. Anticipation for need of PleurX catheter per IR. -Lasix IV -PCCM recommendations: pending  Bilateral shoulder and neck pain Cervical spine metastasis -Continue oxycodone and tizanidine PRN  Leukocytosis Mild. Improved. No evidence of acute infection.  Anxiety -Continue Effexor  GERD -Continue Protonix  Tachycardia Chronic. Likely related to poor conditioning. Patient was evaluated for acute PE on recent admission revealing no evidence of PE.  Breast cancer Melanoma Noted.  OSA Noted   DVT prophylaxis: Lovenox Code Status:   Code Status: Full Code Family Communication: None at bedside Disposition Plan: Discharge home likely not for 3-5 days pending improvement of respiratory failure, likely Pleurx catheter placement   Consultants:  PCCM Interventional radiology  Procedures:  9/13: Left  thoracentesis  Antimicrobials: None    Subjective: Patient reports significant dyspnea on exertion, even while in the bed. She is fine at rest. No other symptoms.  Objective: BP 122/68 (BP Location: Right Arm)   Pulse (!) 127   Temp 98.8 F (37.1 C) (Oral)   Resp (!) 27   Ht 5' 4.5" (1.638 m)   Wt 73.1 kg   LMP 03/27/2011   SpO2 90%   BMI 27.24 kg/m   Examination:  General exam: Appears calm and comfortable Respiratory system: Clear to auscultation on right with diminished breath sounds and mild rales on left. Mild tachypnea noted. Cardiovascular system: S1 & S2 heard, tachycardia, normal rhythm. Gastrointestinal system: Abdomen is nondistended, soft and nontender. No organomegaly or masses felt. Normal bowel sounds heard. Central nervous system: Alert and oriented. No focal neurological deficits. Musculoskeletal: No edema. No calf tenderness Skin: No cyanosis. No rashes Psychiatry: Judgement and insight appear normal. Mood & affect appropriate.    Data Reviewed: I have personally reviewed following labs and imaging studies  CBC Lab Results  Component Value Date   WBC 10.6 (H) 08/09/2023   RBC 3.92 08/09/2023   HGB 11.6 (L) 08/09/2023   HCT 37.7 08/09/2023   MCV 96.2 08/09/2023   MCH 29.6 08/09/2023   PLT 290 08/09/2023   MCHC 30.8 08/09/2023   RDW 13.0 08/09/2023   LYMPHSABS 0.7 08/21/2023   MONOABS 1.2 (H) 08/24/2023   EOSABS 0.5 08/18/2023   BASOSABS 0.1 08/15/2023     Last metabolic panel Lab Results  Component Value Date   NA 134 (L) 08/14/2023   K 4.0 08/12/2023   CL 98 07/31/2023   CO2 22 08/21/2023   BUN 10 08/03/2023   CREATININE 0.46 07/27/2023   GLUCOSE 119 (H) 07/31/2023   GFRNONAA >60 08/12/2023   GFRAA >60 12/22/2019  CALCIUM 9.0 08/15/2023   PROT 6.3 (L) 07/30/2023   ALBUMIN 2.9 (L) 07/30/2023   BILITOT 0.2 (L) 07/30/2023   ALKPHOS 79 07/30/2023   AST 23 07/30/2023   ALT 12 07/30/2023   ANIONGAP 14 08/03/2023     GFR: Estimated Creatinine Clearance: 77.8 mL/min (by C-G formula based on SCr of 0.46 mg/dL).  Recent Results (from the past 240 hour(s))  SARS Coronavirus 2 by RT PCR (hospital order, performed in [facility] hospital lab) *cepheid single result test* Anterior Nasal Swab     Status: None   Collection Time: 08/01/2023  1:48 PM   Specimen: Anterior Nasal Swab  Result Value Ref Range Status   SARS Coronavirus 2 by RT PCR NEGATIVE NEGATIVE Final    Comment: (NOTE) SARS-CoV-2 target nucleic acids are NOT DETECTED.  The SARS-CoV-2 RNA is generally detectable in upper and lower respiratory specimens during the acute phase of infection. The lowest concentration of SARS-CoV-2 viral copies this assay can detect is 250 copies / mL. A negative result does not preclude SARS-CoV-2 infection and should not be used as the sole basis for treatment or other patient management decisions.  A negative result may occur with improper specimen collection / handling, submission of specimen other than nasopharyngeal swab, presence of viral mutation(s) within the areas targeted by this assay, and inadequate number of viral copies (<250 copies / mL). A negative result must be combined with clinical observations, patient history, and epidemiological information.  Fact Sheet for Patients:   RoadLapTop.co.za  Fact Sheet for Healthcare Providers: http://kim-miller.com/  This test is not yet approved or  cleared by the [place] FDA and has been authorized for detection and/or diagnosis of SARS-CoV-2 by FDA under an Emergency Use Authorization (EUA).  This EUA will remain in effect (meaning this test can be used) for the duration of the COVID-19 declaration under Section 564(b)(1) of the Act, 21 U.S.C. section 360bbb-3(b)(1), unless the authorization is terminated or revoked sooner.  Performed at [facility], [address] [place], [place] [address]   MRSA Next Gen by PCR, Nasal     Status: None   Collection Time: 08/09/23  3:41 AM   Specimen: Nasal Mucosa; Nasal Swab  Result Value Ref Range Status   MRSA by PCR Next Gen NOT DETECTED NOT DETECTED Final    Comment: (NOTE) The GeneXpert MRSA Assay (FDA approved for NASAL specimens only), is one component of a comprehensive MRSA colonization surveillance program. It is not intended to diagnose MRSA infection nor to guide or monitor treatment for MRSA infections. Test performance is not FDA approved in patients less than 25 years old. Performed at [facility], [address] [place], [place] [address]       Radiology Studies: [facility] Chest Port 1 View  Result Date: 08/21/2023 CLINICAL DATA:  Shortness of breath. History of lung cancer. Status post thoracentesis. EXAM: PORTABLE CHEST 1 VIEW COMPARISON:  08/02/2023 FINDINGS: Stable cardiomediastinal contours. Interval decreased volume of left pleural effusion status post thoracentesis. No pneumothorax identified. Diffuse interstitial and scattered patchy airspace opacities are again noted and appear unchanged from previous exam. Visualized osseous structures are unremarkable. IMPRESSION: 1. Interval decreased volume of left pleural effusion status post thoracentesis. No pneumothorax identified. 2. Stable diffuse interstitial and scattered patchy airspace opacities. Electronically Signed   By: Signa Kell M.D.   On: 08/09/2023 18:09   IR THORACENTESIS ASP PLEURAL SPACE W/IMG GUIDE  Result Date: 08/16/2023 INDICATION: Patient with history of  left breast cancer, right leg melanoma, recurrent malignant, symptomatic left pleural effusion; request received for therapeutic left thoracentesis EXAM: ULTRASOUND GUIDED THERAPEUTIC LEFT THORACENTESIS MEDICATIONS: 8 ml 1% lidocaine COMPLICATIONS: None immediate. PROCEDURE: An ultrasound guided thoracentesis was thoroughly discussed with the patient and  questions answered. The benefits, risks, alternatives and complications were also discussed. The patient understands and wishes to proceed with the procedure. Written consent was obtained. Ultrasound was performed to localize and mark an adequate pocket of fluid in the left chest. The area was then prepped and draped in the normal sterile fashion. 1% Lidocaine was used for local anesthesia. Under ultrasound guidance a 6 Fr Safe-T-Centesis catheter was introduced. Thoracentesis was performed. The catheter was removed and a dressing applied. FINDINGS: A total of approximately 710 cc of hazy,amber fluid was removed. IMPRESSION: Successful ultrasound guided therapeutic left thoracentesis yielding 710 cc of pleural fluid. Performed by: Artemio Aly Electronically Signed   By: Marliss Coots M.D.   On: 07/31/2023 16:49   DG Chest Portable 1 View  Result Date: 08/23/2023 CLINICAL DATA:  Shortness of breath. EXAM: PORTABLE CHEST 1 VIEW COMPARISON:  Chest radiograph 08/04/2023 FINDINGS: The cardiomediastinal silhouette is unchanged with normal heart size. Lung volumes remain low. Interstitial and airspace opacities throughout both lungs have not significantly changed. A moderate left pleural effusion may have mildly enlarged. No pneumothorax is identified. IMPRESSION: 1. Unchanged bilateral airspace disease. 2. Possible mild enlargement of the left pleural effusion. Electronically Signed   By: Sebastian Ache M.D.   On: 08/17/2023 15:35      LOS: 1 day    Jacquelin Hawking, MD [facility] 08/09/2023, 7:59 AM   If 7PM-7AM, please contact night-coverage [email or web address]

## 2023-08-09 NOTE — Progress Notes (Signed)
NAME:  Sherri Schultz, MRN:  [id number], DOB:  1966/02/10, LOS: 1 ADMISSION DATE:  08/21/2023, CONSULTATION DATE:  08/09/23 REFERRING MD:  TRH, CHIEF COMPLAINT:  doe   History of Present Illness:  57 y.o. woman with history malignant pleural effusion, chronic hypoxemia on 3 L nasal cannula at home presents with worsening dyspnea and hypoxemia.  Contacted pulmonary office.  Has been trying to set up outpatient Pleurx.  Worsening symptoms but unable to get Pleurx done in short notice.  Advised to go to ED.  Chest x-ray showed loculated left pleural effusion on my review interpretation.  As well as scattered multifocal infiltrates consistent with parenchymal lung disease, cancerous signs on my interpretation.  Underwent thoracentesis with IR.  Improvement in hypoxemia.  From nonrebreather down to 4 L at time of evaluation.  Sats high 90s.  Reports improved dyspnea as well.  Pertinent  Medical History  Malignant pleural effusion  Significant Hospital Events: Including procedures, antibiotic start and stop dates in addition to other pertinent events   9/13 admitted to the hospital with worsening dyspnea on exertion and hypoxemia with recurrent left pleural effusion, improvement in symptoms and hypoxemia after thoracentesis  Interim History / Subjective:  Little more dyspneic.  Feels like fluid started to come back little bit.  Overall certainly much improved from prior to thoracentesis.  Discussed role and rationale for letting fluid reaccumulate for Pleurx.  Discussed with Dr. Chestine Spore who takes over pulmonary service from me on Monday and Dr. Caleb Popp.  Objective   Blood pressure 122/68, pulse (!) 127, temperature 98.3 F (36.8 C), temperature source Oral, resp. rate (!) 27, height 5' 4.5" (1.638 m), weight 73.1 kg, last menstrual period 03/27/2011, SpO2 90%.        Intake/Output Summary (Last 24 hours) at 08/09/2023 1544 Last data filed at 08/09/2023 1323 Gross per 24 hour  Intake  415.48 ml  Output 1325 ml  Net -909.52 ml   Filed Weights   08/01/2023 1245 08/09/23 0340  Weight: 78.4 kg 73.1 kg    Examination: General: Chronically ill appearing, lying in bed HENT: EOMI, moist mucous membranes Lungs: Normal breathing, on nasal cannula Cardiovascular: Regular rate and rhythm on monitor, no significant edema Abdomen: Nondistended Neuro:  awake alert moves all extremities no deficits  Resolved Hospital Problem list     Assessment & Plan:  Recurrent left-sided effusion, malignant: Based on cytology 07/18/23 -- Status post thoracentesis -- Would likely benefit from Pleurx, tentative plan for Monday 9/16 with Dr. Chestine Spore  Acute on chronic hypoxemic respiratory failure: Due to likely compressive atelectasis from effusion and shunt with inability to compensate given disease parenchyma. -- Improved after thoracentesis -- Oxygen supplementation, goal O2 sat 88% or greater   Best Practice (right click and "Reselect all SmartList Selections" daily)   Per primary  Labs   CBC: Recent Labs  Lab 08/03/2023 1337 08/09/23 0705  WBC 13.0* 10.6*  NEUTROABS 10.4*  --   HGB 12.8 11.6*  HCT 39.8 37.7  MCV 94.3 96.2  PLT PLATELET CLUMPS NOTED ON SMEAR, UNABLE TO ESTIMATE 290    Basic Metabolic Panel: Recent Labs  Lab 08/22/2023 1337 08/09/23 0705  NA 134* 134*  K 4.0 3.4*  CL 98 96*  CO2 22 25  GLUCOSE 119* 98  BUN 10 8  CREATININE 0.46 0.56  CALCIUM 9.0 8.7*   GFR: Estimated Creatinine Clearance: 77.8 mL/min (by C-G formula based on SCr of 0.56 mg/dL). Recent Labs  Lab 08/16/2023 1337 08/21/2023 1355  08/09/23 0705  WBC 13.0*  --  10.6*  LATICACIDVEN  --  1.6  --     Liver Function Tests: No results for input(s): "AST", "ALT", "ALKPHOS", "BILITOT", "PROT", "ALBUMIN" in the last 168 hours. No results for input(s): "LIPASE", "AMYLASE" in the last 168 hours. No results for input(s): "AMMONIA" in the last 168 hours.  ABG    Component Value Date/Time    HCO3 30.5 (H) 08/01/2023 1337   O2SAT 79.5 08/04/2023 1337     Coagulation Profile: No results for input(s): "INR", "PROTIME" in the last 168 hours.  Cardiac Enzymes: No results for input(s): "CKTOTAL", "CKMB", "CKMBINDEX", "TROPONINI" in the last 168 hours.  HbA1C: No results found for: "HGBA1C"  CBG: No results for input(s): "GLUCAP" in the last 168 hours.  Review of Systems:   No orthopnea or PND.  No worsening swelling.  Comprehensive review of systems otherwise negative.  Past Medical History:  She,  has a past medical history of Anxiety, Breast cancer (HCC) (2021), Cancer (HCC) (2013), Dyspnea, Family history of brain cancer, Family history of breast cancer, Family history of colon cancer, Family history of melanoma, GERD (gastroesophageal reflux disease), Headache, Hypertension, Personal history of radiation therapy, Sleep apnea, and Trimalleolar fracture of ankle, closed, left, initial encounter.   Surgical History:   Past Surgical History:  Procedure Laterality Date   BREAST BIOPSY Right 10+ yrs ago   benign   BREAST BIOPSY Right 01/21/2020   x2   BREAST BIOPSY Left 12/14/2019   x2   BREAST CYST ASPIRATION Right 03/12/2021   BREAST EXCISIONAL BIOPSY Right 02/15/2020   BREAST LUMPECTOMY Left 02/15/2020   BREAST LUMPECTOMY WITH RADIOACTIVE SEED LOCALIZATION Left 02/15/2020   Procedure: LEFT BREAST LUMPECTOMY WITH RADIOACTIVE SEED LOCALIZATION;  Surgeon: Almond Lint, MD;  Location: [facility];  Service: General;  Laterality: Left;   IR THORACENTESIS ASP PLEURAL SPACE W/IMG GUIDE  08/24/2023   MELANOMA EXCISION Right 2013   knee   ORIF ANKLE FRACTURE Left 05/06/2018   Procedure: OPEN REDUCTION INTERNAL FIXATION (ORIF) LEFT TRIMALLEOLAR ANKLE FRACTURE;  Surgeon: Tarry Kos, MD;  Location: [facility];  Service: Orthopedics;  Laterality: Left;   RADIOACTIVE SEED GUIDED EXCISIONAL BREAST BIOPSY Right 02/15/2020   Procedure: RIGHT BREAST  RADIOACTIVE SEED LOCALIZATION EXCISIONAL BIOPSY X 2;  Surgeon: Almond Lint, MD;  Location: [facility];  Service: General;  Laterality: Right;     Social History:   reports that she has never smoked. She has never used smokeless tobacco. She reports that she does not drink alcohol and does not use drugs.   Family History:  Her family history includes Arthritis in her father; Brain cancer in her maternal uncle; Breast cancer in her cousin, cousin, maternal aunt, maternal aunt, and maternal aunt; Breast cancer (age of onset: 3) in her mother; Cancer in her maternal uncle; Colon cancer (age of onset: 48) in her mother; Diabetes in her brother, brother, father, and mother; Heart attack in her father; Hypertension in her father; Melanoma (age of onset: 38) in her brother.   Allergies No Known Allergies   Home Medications  Prior to Admission medications   Medication Sig Start Date End Date Taking? Authorizing Provider  botulinum toxin Type A (BOTOX) 200 units injection Inject 200 Units into the muscle every 3 (three) months. 06/05/22  Yes [provider]  nitroGLYCERIN (NITROSTAT) 0.4 MG SL tablet Place 0.4 mg under the tongue every 5 (five) minutes as needed for chest pain.  07/08/23  Yes [provider]  omeprazole (PRILOSEC) 40 MG capsule Take 40 mg by mouth daily as needed (Indigestion). 07/20/20  Yes [provider]  ondansetron (ZOFRAN) 4 MG tablet Take 1 tablet (4 mg total) by mouth every 6 (six) hours as needed for nausea. 08/03/23  Yes Alwyn Ren, MD  oxyCODONE 10 MG TABS Take 1 tablet (10 mg total) by mouth every 4 (four) hours as needed for severe pain. 08/03/23  Yes Alwyn Ren, MD  polyethylene glycol (MIRALAX / GLYCOLAX) 17 g packet Take 17 g by mouth 2 (two) times daily. Patient taking differently: Take 17 g by mouth 2 (two) times daily as needed for moderate constipation. 08/03/23  Yes Alwyn Ren, MD  propranolol ER  (INDERAL LA) 80 MG 24 hr capsule Take 80 mg by mouth daily.   Yes [provider]  Rimegepant Sulfate (NURTEC) 75 MG TBDP Take by mouth.   Yes [provider]  tiZANidine (ZANAFLEX) 4 MG capsule Take 2 mg by mouth at bedtime as needed for muscle spasms.   Yes [provider]  valACYclovir (VALTREX) 1000 MG tablet Take 2 tablets by mouth 2 (two) times daily as needed ([place]). 02/04/18  Yes [provider]  venlafaxine XR (EFFEXOR-XR) 150 MG 24 hr capsule Take 1 capsule by mouth daily. 03/08/18  Yes [provider]  senna-docusate (SENOKOT-S) 8.6-50 MG tablet Take 2 tablets by mouth 2 (two) times daily. Patient not taking: Reported on 08/18/2023 08/03/23   Alwyn Ren, MD     Critical care time: n/a

## 2023-08-09 NOTE — ED Notes (Addendum)
observations, patient history, and epidemiological information.  Fact Sheet for Patients:   RoadLapTop.co.za  Fact Sheet for Healthcare Providers: http://kim-miller.com/  This test is not yet approved or  cleared by the [place] FDA and has been authorized for detection and/or diagnosis of SARS-CoV-2 by FDA under an Emergency Use Authorization (EUA).  This EUA will remain in effect (meaning this test can be used) for the duration of the COVID-19 declaration under Section 564(b)(1) of the Act, 21 U.S.C. section 360bbb-3(b)(1), unless the authorization is terminated or revoked sooner.  Performed at [facility], [address], [place], [place] [address]   I-Stat CG4 Lactic Acid     Status: None   Collection Time: 08/05/2023  1:55 PM  Result Value Ref Range   Lactic Acid, Venous 1.6 0.5 - 1.9 mmol/L  Troponin I (High Sensitivity)     Status: None   Collection Time: 07/30/2023  3:08 PM  Result Value Ref Range   Troponin I (High Sensitivity) 5 <18 ng/L    Comment: (NOTE) Elevated high sensitivity troponin I (hsTnI) values and significant  changes across serial measurements may suggest ACS but many other  chronic and acute conditions are known to elevate hsTnI results.  Refer to the "Links" section for chest pain algorithms and additional  guidance. Performed at [facility], [address] [place], [place] [address]   Brain natriuretic peptide     Status: None   Collection Time: 08/21/2023  5:13 PM  Result Value Ref Range   B Natriuretic Peptide 19.5 0.0 - 100.0 pg/mL    Comment: Performed at [facility], [address] [place], [place] [address]   Urinalysis, Routine w reflex microscopic -Urine, Clean Catch     Status: Abnormal   Collection Time: 08/01/2023  5:43 PM  Result Value Ref Range   Color, Urine YELLOW YELLOW   APPearance HAZY (A) CLEAR   Specific Gravity, Urine 1.030 1.005 - 1.030   pH 5.0 5.0 - 8.0   Glucose, UA NEGATIVE NEGATIVE mg/dL   Hgb urine dipstick NEGATIVE NEGATIVE   Bilirubin Urine NEGATIVE NEGATIVE   Ketones, ur 20 (A) NEGATIVE mg/dL   Protein, ur 30 (A) NEGATIVE mg/dL   Nitrite NEGATIVE NEGATIVE   Leukocytes,Ua SMALL (A) NEGATIVE   RBC / HPF 0-5 0 - 5 RBC/hpf   WBC, UA 11-20 0 - 5 WBC/hpf   Bacteria, UA MANY (A) NONE SEEN   Squamous Epithelial / HPF 6-10 0 - 5 /HPF   Mucus PRESENT     Comment: Performed at [facility], [address], [place], [place] [address]   DG Chest Port 1 View  Result Date: 08/15/2023 CLINICAL DATA:  Shortness of breath. History of lung cancer. Status post thoracentesis. EXAM: PORTABLE CHEST 1 VIEW COMPARISON:  08/15/2023 FINDINGS: Stable cardiomediastinal contours. Interval decreased volume of left pleural effusion status post thoracentesis. No pneumothorax identified. Diffuse interstitial and scattered patchy airspace opacities are again noted and appear unchanged from previous exam. Visualized osseous structures are unremarkable. IMPRESSION: 1. Interval decreased volume of left pleural effusion status post thoracentesis. No pneumothorax identified. 2. Stable diffuse interstitial and scattered patchy airspace opacities. Electronically Signed   By: Signa Kell M.D.   On: 08/12/2023 18:09   IR THORACENTESIS ASP PLEURAL SPACE W/IMG GUIDE  Result Date: 07/29/2023 INDICATION: Patient with history of left breast cancer, right leg melanoma, recurrent malignant, symptomatic left pleural effusion; request received for therapeutic left thoracentesis EXAM: ULTRASOUND GUIDED THERAPEUTIC  ED TO INPATIENT HANDOFF REPORT  Name/Age/Gender Sherri Schultz 57 y.o. female  Code Status    Code Status Orders  (From admission, onward)           Start     Ordered   07/30/2023 1638  Full code  Continuous       Question:  By:  Answer:  Consent: discussion documented in EHR   08/25/2023 1637           Code Status History     Date Active Date Inactive Code Status Order ID Comments User Context   07/29/2023 1548 08/03/2023 2020 Full Code [id number]  Hughie Closs, MD ED       Home/SNF/Other Home  Chief Complaint Acute respiratory distress [R06.03]  Level of Care/Admitting Diagnosis ED Disposition     ED Disposition  Admit   Condition  --   Comment  Hospital Area: [facility] [[id number]]  Level of Care: Stepdown [14]  Admit to SDU based on following criteria: Respiratory Distress:  Frequent assessment and/or intervention to maintain adequate ventilation/respiration, pulmonary toilet, and respiratory treatment.  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 knee   Dyspnea    Family history of brain cancer    Family history of breast cancer    Family history of colon cancer    Family history of melanoma    GERD (gastroesophageal reflux disease)    Headache    Hypertension    Personal history of radiation therapy    Sleep apnea    Trimalleolar fracture of ankle, closed, left, initial encounter     Allergies No Known  Allergies  IV Location/Drains/Wounds Patient Lines/Drains/Airways Status     Active Line/Drains/Airways     Name Placement date Placement time Site Days   Peripheral IV 08/23/2023 20 G 1.88" Anterior;Left Forearm 07/31/2023  1340  Forearm  1            Labs/Imaging Results for orders placed or performed during the hospital encounter of 08/04/2023 (from the past 48 hour(s))  CBC with Differential     Status: Abnormal   Collection Time: 08/17/2023  1:37 PM  Result Value Ref Range   WBC 13.0 (H) 4.0 - 10.5 K/uL   RBC 4.22 3.87 - 5.11 MIL/uL   Hemoglobin 12.8 12.0 - 15.0 g/dL   HCT 62.1 30.8 - 65.7 %   MCV 94.3 80.0 - 100.0 fL   MCH 30.3 26.0 - 34.0 pg   MCHC 32.2 30.0 - 36.0 g/dL   RDW 84.6 96.2 - 95.2 %   Platelets PLATELET CLUMPS NOTED ON SMEAR, UNABLE TO ESTIMATE 150 - 400 K/uL   nRBC 0.0 0.0 - 0.2 %   Neutrophils Relative % 79 %   Neutro Abs 10.4 (H) 1.7 - 7.7 K/uL   Lymphocytes Relative 5 %   Lymphs Abs 0.7 0.7 - 4.0 K/uL   Monocytes Relative 9 %   Monocytes Absolute 1.2 (H)  observations, patient history, and epidemiological information.  Fact Sheet for Patients:   RoadLapTop.co.za  Fact Sheet for Healthcare Providers: http://kim-miller.com/  This test is not yet approved or  cleared by the [place] FDA and has been authorized for detection and/or diagnosis of SARS-CoV-2 by FDA under an Emergency Use Authorization (EUA).  This EUA will remain in effect (meaning this test can be used) for the duration of the COVID-19 declaration under Section 564(b)(1) of the Act, 21 U.S.C. section 360bbb-3(b)(1), unless the authorization is terminated or revoked sooner.  Performed at [facility], [address], [place], [place] [address]   I-Stat CG4 Lactic Acid     Status: None   Collection Time: 08/05/2023  1:55 PM  Result Value Ref Range   Lactic Acid, Venous 1.6 0.5 - 1.9 mmol/L  Troponin I (High Sensitivity)     Status: None   Collection Time: 07/30/2023  3:08 PM  Result Value Ref Range   Troponin I (High Sensitivity) 5 <18 ng/L    Comment: (NOTE) Elevated high sensitivity troponin I (hsTnI) values and significant  changes across serial measurements may suggest ACS but many other  chronic and acute conditions are known to elevate hsTnI results.  Refer to the "Links" section for chest pain algorithms and additional  guidance. Performed at [facility], [address] [place], [place] [address]   Brain natriuretic peptide     Status: None   Collection Time: 08/21/2023  5:13 PM  Result Value Ref Range   B Natriuretic Peptide 19.5 0.0 - 100.0 pg/mL    Comment: Performed at [facility], [address] [place], [place] [address]   Urinalysis, Routine w reflex microscopic -Urine, Clean Catch     Status: Abnormal   Collection Time: 08/01/2023  5:43 PM  Result Value Ref Range   Color, Urine YELLOW YELLOW   APPearance HAZY (A) CLEAR   Specific Gravity, Urine 1.030 1.005 - 1.030   pH 5.0 5.0 - 8.0   Glucose, UA NEGATIVE NEGATIVE mg/dL   Hgb urine dipstick NEGATIVE NEGATIVE   Bilirubin Urine NEGATIVE NEGATIVE   Ketones, ur 20 (A) NEGATIVE mg/dL   Protein, ur 30 (A) NEGATIVE mg/dL   Nitrite NEGATIVE NEGATIVE   Leukocytes,Ua SMALL (A) NEGATIVE   RBC / HPF 0-5 0 - 5 RBC/hpf   WBC, UA 11-20 0 - 5 WBC/hpf   Bacteria, UA MANY (A) NONE SEEN   Squamous Epithelial / HPF 6-10 0 - 5 /HPF   Mucus PRESENT     Comment: Performed at [facility], [address], [place], [place] [address]   DG Chest Port 1 View  Result Date: 08/15/2023 CLINICAL DATA:  Shortness of breath. History of lung cancer. Status post thoracentesis. EXAM: PORTABLE CHEST 1 VIEW COMPARISON:  08/15/2023 FINDINGS: Stable cardiomediastinal contours. Interval decreased volume of left pleural effusion status post thoracentesis. No pneumothorax identified. Diffuse interstitial and scattered patchy airspace opacities are again noted and appear unchanged from previous exam. Visualized osseous structures are unremarkable. IMPRESSION: 1. Interval decreased volume of left pleural effusion status post thoracentesis. No pneumothorax identified. 2. Stable diffuse interstitial and scattered patchy airspace opacities. Electronically Signed   By: Signa Kell M.D.   On: 08/12/2023 18:09   IR THORACENTESIS ASP PLEURAL SPACE W/IMG GUIDE  Result Date: 07/29/2023 INDICATION: Patient with history of left breast cancer, right leg melanoma, recurrent malignant, symptomatic left pleural effusion; request received for therapeutic left thoracentesis EXAM: ULTRASOUND GUIDED THERAPEUTIC  observations, patient history, and epidemiological information.  Fact Sheet for Patients:   RoadLapTop.co.za  Fact Sheet for Healthcare Providers: http://kim-miller.com/  This test is not yet approved or  cleared by the [place] FDA and has been authorized for detection and/or diagnosis of SARS-CoV-2 by FDA under an Emergency Use Authorization (EUA).  This EUA will remain in effect (meaning this test can be used) for the duration of the COVID-19 declaration under Section 564(b)(1) of the Act, 21 U.S.C. section 360bbb-3(b)(1), unless the authorization is terminated or revoked sooner.  Performed at [facility], [address], [place], [place] [address]   I-Stat CG4 Lactic Acid     Status: None   Collection Time: 08/05/2023  1:55 PM  Result Value Ref Range   Lactic Acid, Venous 1.6 0.5 - 1.9 mmol/L  Troponin I (High Sensitivity)     Status: None   Collection Time: 07/30/2023  3:08 PM  Result Value Ref Range   Troponin I (High Sensitivity) 5 <18 ng/L    Comment: (NOTE) Elevated high sensitivity troponin I (hsTnI) values and significant  changes across serial measurements may suggest ACS but many other  chronic and acute conditions are known to elevate hsTnI results.  Refer to the "Links" section for chest pain algorithms and additional  guidance. Performed at [facility], [address] [place], [place] [address]   Brain natriuretic peptide     Status: None   Collection Time: 08/21/2023  5:13 PM  Result Value Ref Range   B Natriuretic Peptide 19.5 0.0 - 100.0 pg/mL    Comment: Performed at [facility], [address] [place], [place] [address]   Urinalysis, Routine w reflex microscopic -Urine, Clean Catch     Status: Abnormal   Collection Time: 08/01/2023  5:43 PM  Result Value Ref Range   Color, Urine YELLOW YELLOW   APPearance HAZY (A) CLEAR   Specific Gravity, Urine 1.030 1.005 - 1.030   pH 5.0 5.0 - 8.0   Glucose, UA NEGATIVE NEGATIVE mg/dL   Hgb urine dipstick NEGATIVE NEGATIVE   Bilirubin Urine NEGATIVE NEGATIVE   Ketones, ur 20 (A) NEGATIVE mg/dL   Protein, ur 30 (A) NEGATIVE mg/dL   Nitrite NEGATIVE NEGATIVE   Leukocytes,Ua SMALL (A) NEGATIVE   RBC / HPF 0-5 0 - 5 RBC/hpf   WBC, UA 11-20 0 - 5 WBC/hpf   Bacteria, UA MANY (A) NONE SEEN   Squamous Epithelial / HPF 6-10 0 - 5 /HPF   Mucus PRESENT     Comment: Performed at [facility], [address], [place], [place] [address]   DG Chest Port 1 View  Result Date: 08/15/2023 CLINICAL DATA:  Shortness of breath. History of lung cancer. Status post thoracentesis. EXAM: PORTABLE CHEST 1 VIEW COMPARISON:  08/15/2023 FINDINGS: Stable cardiomediastinal contours. Interval decreased volume of left pleural effusion status post thoracentesis. No pneumothorax identified. Diffuse interstitial and scattered patchy airspace opacities are again noted and appear unchanged from previous exam. Visualized osseous structures are unremarkable. IMPRESSION: 1. Interval decreased volume of left pleural effusion status post thoracentesis. No pneumothorax identified. 2. Stable diffuse interstitial and scattered patchy airspace opacities. Electronically Signed   By: Signa Kell M.D.   On: 08/12/2023 18:09   IR THORACENTESIS ASP PLEURAL SPACE W/IMG GUIDE  Result Date: 07/29/2023 INDICATION: Patient with history of left breast cancer, right leg melanoma, recurrent malignant, symptomatic left pleural effusion; request received for therapeutic left thoracentesis EXAM: ULTRASOUND GUIDED THERAPEUTIC  ED TO INPATIENT HANDOFF REPORT  Name/Age/Gender Sherri Schultz 57 y.o. female  Code Status    Code Status Orders  (From admission, onward)           Start     Ordered   07/30/2023 1638  Full code  Continuous       Question:  By:  Answer:  Consent: discussion documented in EHR   08/25/2023 1637           Code Status History     Date Active Date Inactive Code Status Order ID Comments User Context   07/29/2023 1548 08/03/2023 2020 Full Code [id number]  Hughie Closs, MD ED       Home/SNF/Other Home  Chief Complaint Acute respiratory distress [R06.03]  Level of Care/Admitting Diagnosis ED Disposition     ED Disposition  Admit   Condition  --   Comment  Hospital Area: [facility] [[id number]]  Level of Care: Stepdown [14]  Admit to SDU based on following criteria: Respiratory Distress:  Frequent assessment and/or intervention to maintain adequate ventilation/respiration, pulmonary toilet, and respiratory treatment.  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 knee   Dyspnea    Family history of brain cancer    Family history of breast cancer    Family history of colon cancer    Family history of melanoma    GERD (gastroesophageal reflux disease)    Headache    Hypertension    Personal history of radiation therapy    Sleep apnea    Trimalleolar fracture of ankle, closed, left, initial encounter     Allergies No Known  Allergies  IV Location/Drains/Wounds Patient Lines/Drains/Airways Status     Active Line/Drains/Airways     Name Placement date Placement time Site Days   Peripheral IV 08/23/2023 20 G 1.88" Anterior;Left Forearm 07/31/2023  1340  Forearm  1            Labs/Imaging Results for orders placed or performed during the hospital encounter of 08/04/2023 (from the past 48 hour(s))  CBC with Differential     Status: Abnormal   Collection Time: 08/17/2023  1:37 PM  Result Value Ref Range   WBC 13.0 (H) 4.0 - 10.5 K/uL   RBC 4.22 3.87 - 5.11 MIL/uL   Hemoglobin 12.8 12.0 - 15.0 g/dL   HCT 62.1 30.8 - 65.7 %   MCV 94.3 80.0 - 100.0 fL   MCH 30.3 26.0 - 34.0 pg   MCHC 32.2 30.0 - 36.0 g/dL   RDW 84.6 96.2 - 95.2 %   Platelets PLATELET CLUMPS NOTED ON SMEAR, UNABLE TO ESTIMATE 150 - 400 K/uL   nRBC 0.0 0.0 - 0.2 %   Neutrophils Relative % 79 %   Neutro Abs 10.4 (H) 1.7 - 7.7 K/uL   Lymphocytes Relative 5 %   Lymphs Abs 0.7 0.7 - 4.0 K/uL   Monocytes Relative 9 %   Monocytes Absolute 1.2 (H)

## 2023-08-10 ENCOUNTER — Encounter (HOSPITAL_COMMUNITY): Payer: Self-pay | Admitting: Emergency Medicine

## 2023-08-10 DIAGNOSIS — R0603 Acute respiratory distress: Secondary | ICD-10-CM | POA: Diagnosis not present

## 2023-08-10 DIAGNOSIS — J910 Malignant pleural effusion: Secondary | ICD-10-CM | POA: Diagnosis not present

## 2023-08-10 DIAGNOSIS — J9621 Acute and chronic respiratory failure with hypoxia: Secondary | ICD-10-CM | POA: Diagnosis not present

## 2023-08-10 LAB — BASIC METABOLIC PANEL
Anion gap: 9 (ref 5–15)
BUN: 9 mg/dL (ref 6–20)
CO2: 28 mmol/L (ref 22–32)
Calcium: 8.7 mg/dL — ABNORMAL LOW (ref 8.9–10.3)
Chloride: 95 mmol/L — ABNORMAL LOW (ref 98–111)
Creatinine, Ser: 0.64 mg/dL (ref 0.44–1.00)
GFR, Estimated: >60 mL/min (ref >60)
Glucose, Bld: 111 mg/dL — ABNORMAL HIGH (ref 70–99)
Potassium: 3.5 mmol/L (ref 3.5–5.1)
Sodium: 132 mmol/L — ABNORMAL LOW (ref 135–145)

## 2023-08-10 MED ORDER — MENTHOL 3 MG MT LOZG: 1.0000 | LOZENGE | OROMUCOSAL | Status: DC | PRN

## 2023-08-10 MED ORDER — IPRATROPIUM-ALBUTEROL 0.5-2.5 (3) MG/3ML IN SOLN
3.0000 mL | Freq: Two times a day (BID) | RESPIRATORY_TRACT | Status: DC
  Administered 2023-08-10 – 2023-08-20 (×20): 3 mL via RESPIRATORY_TRACT
  Filled 2023-08-10 (×20): qty 3

## 2023-08-10 MED ORDER — HYDROCOD POLI-CHLORPHE POLI ER 10-8 MG/5ML PO SUER
5.0000 mL | Freq: Two times a day (BID) | ORAL | Status: DC | PRN
  Administered 2023-08-12 (×2): 5 mL via ORAL
  Filled 2023-08-10 (×2): qty 5

## 2023-08-10 MED ORDER — FUROSEMIDE 10 MG/ML IJ SOLN
40.0000 mg | Freq: Every day | INTRAMUSCULAR | Status: DC
  Administered 2023-08-10 – 2023-08-19 (×10): 40 mg via INTRAVENOUS
  Filled 2023-08-10 (×11): qty 4

## 2023-08-10 MED ORDER — FUROSEMIDE 10 MG/ML IJ SOLN: 40.0000 mg | Freq: Two times a day (BID) | INTRAMUSCULAR | Status: DC

## 2023-08-10 MED ORDER — ENOXAPARIN SODIUM 40 MG/0.4ML IJ SOSY
40.0000 mg | PREFILLED_SYRINGE | Freq: Every day | INTRAMUSCULAR | Status: AC
  Administered 2023-08-10: 40 mg via SUBCUTANEOUS
  Filled 2023-08-10: qty 0.4

## 2023-08-10 NOTE — Progress Notes (Signed)
NAME:  Sherri Schultz, MRN:  [id number], DOB:  1966/09/26, LOS: 2 ADMISSION DATE:  07/27/2023, CONSULTATION DATE:  08/10/23 REFERRING MD:  TRH, CHIEF COMPLAINT:  doe   History of Present Illness:  57 y.o. woman with history malignant pleural effusion, chronic hypoxemia on 3 L nasal cannula at home presents with worsening dyspnea and hypoxemia.  Contacted pulmonary office.  Has been trying to set up outpatient Pleurx.  Worsening symptoms but unable to get Pleurx done in short notice.  Advised to go to ED.  Chest x-ray showed loculated left pleural effusion on my review interpretation.  As well as scattered multifocal infiltrates consistent with parenchymal lung disease, cancerous signs on my interpretation.  Underwent thoracentesis with IR.  Improvement in hypoxemia.  From nonrebreather down to 4 L at time of evaluation.  Sats high 90s.  Reports improved dyspnea as well.  Pertinent  Medical History  Malignant pleural effusion  Significant Hospital Events: Including procedures, antibiotic start and stop dates in addition to other pertinent events   9/13 admitted to the hospital with worsening dyspnea on exertion and hypoxemia with recurrent left pleural effusion, improvement in symptoms and hypoxemia after thoracentesis  Interim History / Subjective:  Stable.  Feels better overall.  A bit more dyspneic compared to after thoracentesis but similar to yesterday at time of evaluation.   Objective   Blood pressure 112/68, pulse 95, temperature 98.6 F (37 C), temperature source Oral, resp. rate (!) 31, height 5' 4.5" (1.638 m), weight 73.1 kg, last menstrual period 03/27/2011, SpO2 91%.        Intake/Output Summary (Last 24 hours) at 08/10/2023 0918 Last data filed at 08/10/2023 0800 Gross per 24 hour  Intake 240 ml  Output 825 ml  Net -585 ml   Filed Weights   08/18/2023 1245 08/09/23 0340  Weight: 78.4 kg 73.1 kg    Examination: General: Chronically ill appearing, lying in  bed HENT: EOMI, moist mucous membranes Lungs: Normal breathing, on nasal cannula Cardiovascular: Regular rate and rhythm on monitor, no significant edema Abdomen: Nondistended Neuro:  awake alert moves all extremities no deficits  Resolved Hospital Problem list     Assessment & Plan:  Recurrent left-sided effusion, malignant: Based on cytology 07/18/23 -- Status post thoracentesis -- Would likely benefit from Pleurx, tentative plan for Monday 9/16 with Dr. Chestine Spore -- N.p.o. at midnight -- AM Lovenox prophylaxis discontinued for 9/16  Acute on chronic hypoxemic respiratory failure: Due to likely compressive atelectasis from effusion and shunt with inability to compensate given disease parenchyma. -- Improved after thoracentesis -- Oxygen supplementation, goal O2 sat 88% or greater   Best Practice (right click and "Reselect all SmartList Selections" daily)   Per primary  Labs   CBC: Recent Labs  Lab 08/13/2023 1337 08/09/23 0705  WBC 13.0* 10.6*  NEUTROABS 10.4*  --   HGB 12.8 11.6*  HCT 39.8 37.7  MCV 94.3 96.2  PLT PLATELET CLUMPS NOTED ON SMEAR, UNABLE TO ESTIMATE 290    Basic Metabolic Panel: Recent Labs  Lab 08/03/2023 1337 08/09/23 0705  NA 134* 134*  K 4.0 3.4*  CL 98 96*  CO2 22 25  GLUCOSE 119* 98  BUN 10 8  CREATININE 0.46 0.56  CALCIUM 9.0 8.7*   GFR: Estimated Creatinine Clearance: 77.8 mL/min (by C-G formula based on SCr of 0.56 mg/dL). Recent Labs  Lab 07/30/2023 1337 08/03/2023 1355 08/09/23 0705  WBC 13.0*  --  10.6*  LATICACIDVEN  --  1.6  --  Liver Function Tests: No results for input(s): "AST", "ALT", "ALKPHOS", "BILITOT", "PROT", "ALBUMIN" in the last 168 hours. No results for input(s): "LIPASE", "AMYLASE" in the last 168 hours. No results for input(s): "AMMONIA" in the last 168 hours.  ABG    Component Value Date/Time   HCO3 30.5 (H) 08/12/2023 1337   O2SAT 79.5 08/25/2023 1337     Coagulation Profile: No results for input(s):  "INR", "PROTIME" in the last 168 hours.  Cardiac Enzymes: No results for input(s): "CKTOTAL", "CKMB", "CKMBINDEX", "TROPONINI" in the last 168 hours.  HbA1C: No results found for: "HGBA1C"  CBG: No results for input(s): "GLUCAP" in the last 168 hours.  Review of Systems:   No orthopnea or PND.  No worsening swelling.  Comprehensive review of systems otherwise negative.  Past Medical History:  She,  has a past medical history of Anxiety, Breast cancer (HCC) (2021), Cancer (HCC) (2013), Dyspnea, Family history of brain cancer, Family history of breast cancer, Family history of colon cancer, Family history of melanoma, GERD (gastroesophageal reflux disease), Headache, Hypertension, Personal history of radiation therapy, Sleep apnea, and Trimalleolar fracture of ankle, closed, left, initial encounter.   Surgical History:   Past Surgical History:  Procedure Laterality Date   BREAST BIOPSY Right 10+ yrs ago   benign   BREAST BIOPSY Right 01/21/2020   x2   BREAST BIOPSY Left 12/14/2019   x2   BREAST CYST ASPIRATION Right 03/12/2021   BREAST EXCISIONAL BIOPSY Right 02/15/2020   BREAST LUMPECTOMY Left 02/15/2020   BREAST LUMPECTOMY WITH RADIOACTIVE SEED LOCALIZATION Left 02/15/2020   Procedure: LEFT BREAST LUMPECTOMY WITH RADIOACTIVE SEED LOCALIZATION;  Surgeon: Almond Lint, MD;  Location: [facility];  Service: General;  Laterality: Left;   IR THORACENTESIS ASP PLEURAL SPACE W/IMG GUIDE  08/22/2023   MELANOMA EXCISION Right 2013   knee   ORIF ANKLE FRACTURE Left 05/06/2018   Procedure: OPEN REDUCTION INTERNAL FIXATION (ORIF) LEFT TRIMALLEOLAR ANKLE FRACTURE;  Surgeon: Tarry Kos, MD;  Location: [facility];  Service: Orthopedics;  Laterality: Left;   RADIOACTIVE SEED GUIDED EXCISIONAL BREAST BIOPSY Right 02/15/2020   Procedure: RIGHT BREAST RADIOACTIVE SEED LOCALIZATION EXCISIONAL BIOPSY X 2;  Surgeon: Almond Lint, MD;  Location: [facility];  Service: General;  Laterality: Right;     Social History:   reports that she has never smoked. She has never used smokeless tobacco. She reports that she does not drink alcohol and does not use drugs.   Family History:  Her family history includes Arthritis in her father; Brain cancer in her maternal uncle; Breast cancer in her cousin, cousin, maternal aunt, maternal aunt, and maternal aunt; Breast cancer (age of onset: 52) in her mother; Cancer in her maternal uncle; Colon cancer (age of onset: 45) in her mother; Diabetes in her brother, brother, father, and mother; Heart attack in her father; Hypertension in her father; Melanoma (age of onset: 51) in her brother.   Allergies No Known Allergies   Home Medications  Prior to Admission medications   Medication Sig Start Date End Date Taking? Authorizing Provider  botulinum toxin Type A (BOTOX) 200 units injection Inject 200 Units into the muscle every 3 (three) months. 06/05/22  Yes [provider]  nitroGLYCERIN (NITROSTAT) 0.4 MG SL tablet Place 0.4 mg under the tongue every 5 (five) minutes as needed for chest pain. 07/08/23  Yes [provider]  omeprazole (PRILOSEC) 40 MG capsule Take 40 mg by mouth daily as needed (Indigestion).  07/20/20  Yes [provider]  ondansetron (ZOFRAN) 4 MG tablet Take 1 tablet (4 mg total) by mouth every 6 (six) hours as needed for nausea. 08/03/23  Yes Alwyn Ren, MD  oxyCODONE 10 MG TABS Take 1 tablet (10 mg total) by mouth every 4 (four) hours as needed for severe pain. 08/03/23  Yes Alwyn Ren, MD  polyethylene glycol (MIRALAX / GLYCOLAX) 17 g packet Take 17 g by mouth 2 (two) times daily. Patient taking differently: Take 17 g by mouth 2 (two) times daily as needed for moderate constipation. 08/03/23  Yes Alwyn Ren, MD  propranolol ER (INDERAL LA) 80 MG 24 hr capsule Take 80 mg by mouth daily.   Yes [provider]  Rimegepant Sulfate  (NURTEC) 75 MG TBDP Take by mouth.   Yes [provider]  tiZANidine (ZANAFLEX) 4 MG capsule Take 2 mg by mouth at bedtime as needed for muscle spasms.   Yes [provider]  valACYclovir (VALTREX) 1000 MG tablet Take 2 tablets by mouth 2 (two) times daily as needed ([place]). 02/04/18  Yes [provider]  venlafaxine XR (EFFEXOR-XR) 150 MG 24 hr capsule Take 1 capsule by mouth daily. 03/08/18  Yes [provider]  senna-docusate (SENOKOT-S) 8.6-50 MG tablet Take 2 tablets by mouth 2 (two) times daily. Patient not taking: Reported on 08/01/2023 08/03/23   Alwyn Ren, MD     Critical care time: n/a

## 2023-08-10 NOTE — Progress Notes (Signed)
   08/10/23 2339  BiPAP/CPAP/SIPAP  BiPAP/CPAP/SIPAP Pt Type Adult  Reason BIPAP/CPAP not in use Non-compliant (PT not yet using at home, does not want to try tonight.)

## 2023-08-10 NOTE — Progress Notes (Signed)
PROGRESS NOTE    Sherri Schultz  ZOX:[id number] DOB: 12/30/1965 DOA: 07/31/2023 PCP: Elizabeth Palau, FNP   Brief Narrative: Sherri Schultz is a 57 y.o. female with a history of OSA< breast cancer, migraine headaches, right leg melanoma, anxiety, hypertension, malignant pleural effusion.  Patient presented secondary to worsening shortness of breath and found to have recurrent left sided pleural effusion requiring thoracentesis. Some improvement immediately after thoracentesis with worsening dyspnea likely related to re-accumulation.   Assessment and Plan:  Acute on chronic respiratory failure with hypoxia Secondary to recurrent pleural effusion. Patient uses 2-3 L/min at baseline, but has required non-rebreather on admission. She has weaned down to 5 L/min after thoracentesis. Still with significant dyspnea with mobility. -Management of pleural effusion -Wean to 3 L/min as able -Ambulatory pulse ox once able  Recurrent left-sided malignant pleural effusion Patient underwent successful left-sided thoracentesis on 9/13 yielding 0.7 liters of hazy/amber fluid. Discussed with PCCM who will perform a bedside ultrasound to reassess fluid re-accumulation. Anticipation for need of PleurX catheter per IR. -Lasix IV -PCCM recommendations: Pleurx catheter placement tentatively planned for 2023-08-29  Bilateral shoulder and neck pain Cervical spine metastasis -Continue oxycodone and tizanidine PRN  Leukocytosis Mild. Improved. No evidence of acute infection.  Anxiety -Continue Effexor  GERD -Continue Protonix  Tachycardia Chronic. Likely related to poor conditioning. Patient was evaluated for acute PE on recent admission revealing no evidence of PE.  Breast cancer Melanoma Noted.  OSA Noted   DVT prophylaxis: Lovenox Code Status:   Code Status: Full Code Family Communication: None at bedside Disposition Plan: Discharge home likely not for 3-4 days pending  improvement of respiratory failure, likely Pleurx catheter placement   Consultants:  PCCM Interventional radiology  Procedures:  9/13: Left thoracentesis  Antimicrobials: None    Subjective: Continues to have dyspnea with movement. Also is having trouble with her continued cough. Some associated sore throat.  Objective: BP (!) 98/50   Pulse 87   Temp 98.2 F (36.8 C)   Resp 19   Ht 5' 4.5" (1.638 m)   Wt 73.1 kg   LMP 03/27/2011   SpO2 91%   BMI 27.24 kg/m   Examination:  General exam: Appears calm and comfortable Respiratory system: Clear to auscultation on right with diminished breath sounds and rales on left. Cardiovascular system: S1 & S2 heard, RRR. Gastrointestinal system: Abdomen is nondistended, soft and nontender. Normal bowel sounds heard. Central nervous system: Alert and oriented. No focal neurological deficits. Musculoskeletal: No edema. No calf tenderness Psychiatry: Judgement and insight appear normal. Mood & affect appropriate.     Data Reviewed: I have personally reviewed following labs and imaging studies  CBC Lab Results  Component Value Date   WBC 10.6 (H) 08/09/2023   RBC 3.92 08/09/2023   HGB 11.6 (L) 08/09/2023   HCT 37.7 08/09/2023   MCV 96.2 08/09/2023   MCH 29.6 08/09/2023   PLT 290 08/09/2023   MCHC 30.8 08/09/2023   RDW 13.0 08/09/2023   LYMPHSABS 0.7 08/19/2023   MONOABS 1.2 (H) 08/06/2023   EOSABS 0.5 08/05/2023   BASOSABS 0.1 08/25/2023     Last metabolic panel Lab Results  Component Value Date   NA 134 (L) 08/09/2023   K 3.4 (L) 08/09/2023   CL 96 (L) 08/09/2023   CO2 25 08/09/2023   BUN 8 08/09/2023   CREATININE 0.56 08/09/2023   GLUCOSE 98 08/09/2023   GFRNONAA >60 08/09/2023   GFRAA >60 12/22/2019   CALCIUM 8.7 (  L) 08/09/2023   PROT 6.3 (L) 07/30/2023   ALBUMIN 2.9 (L) 07/30/2023   BILITOT 0.2 (L) 07/30/2023   ALKPHOS 79 07/30/2023   AST 23 07/30/2023   ALT 12 07/30/2023   ANIONGAP 13 08/09/2023     GFR: Estimated Creatinine Clearance: 77.8 mL/min (by C-G formula based on SCr of 0.56 mg/dL).  Recent Results (from the past 240 hour(s))  SARS Coronavirus 2 by RT PCR (hospital order, performed in [facility] hospital lab) *cepheid single result test* Anterior Nasal Swab     Status: None   Collection Time: 22-Aug-2023  1:48 PM   Specimen: Anterior Nasal Swab  Result Value Ref Range Status   SARS Coronavirus 2 by RT PCR NEGATIVE NEGATIVE Final    Comment: (NOTE) SARS-CoV-2 target nucleic acids are NOT DETECTED.  The SARS-CoV-2 RNA is generally detectable in upper and lower respiratory specimens during the acute phase of infection. The lowest concentration of SARS-CoV-2 viral copies this assay can detect is 250 copies / mL. A negative result does not preclude SARS-CoV-2 infection and should not be used as the sole basis for treatment or other patient management decisions.  A negative result may occur with improper specimen collection / handling, submission of specimen other than nasopharyngeal swab, presence of viral mutation(s) within the areas targeted by this assay, and inadequate number of viral copies (<250 copies / mL). A negative result must be combined with clinical observations, patient history, and epidemiological information.  Fact Sheet for Patients:   RoadLapTop.co.za  Fact Sheet for Healthcare Providers: http://kim-miller.com/  This test is not yet approved or  cleared by the [place] FDA and has been authorized for detection and/or diagnosis of SARS-CoV-2 by FDA under an Emergency Use Authorization (EUA).  This EUA will remain in effect (meaning this test can be used) for the duration of the COVID-19 declaration under Section 564(b)(1) of the Act, 21 U.S.C. section 360bbb-3(b)(1), unless the authorization is terminated or revoked sooner.  Performed at [facility], [address], [place], [place] [address]   MRSA Next Gen by PCR, Nasal     Status: None   Collection Time: 08/09/23  3:41 AM   Specimen: Nasal Mucosa; Nasal Swab  Result Value Ref Range Status   MRSA by PCR Next Gen NOT DETECTED NOT DETECTED Final    Comment: (NOTE) The GeneXpert MRSA Assay (FDA approved for NASAL specimens only), is one component of a comprehensive MRSA colonization surveillance program. It is not intended to diagnose MRSA infection nor to guide or monitor treatment for MRSA infections. Test performance is not FDA approved in patients less than 44 years old. Performed at [facility], [address], [place], [place] [address]       Radiology Studies: [facility] Chest Port 1 View  Result Date: August 22, 2023 CLINICAL DATA:  Shortness of breath. History of lung cancer. Status post thoracentesis. EXAM: PORTABLE CHEST 1 VIEW COMPARISON:  August 22, 2023 FINDINGS: Stable cardiomediastinal contours. Interval decreased volume of left pleural effusion status post thoracentesis. No pneumothorax identified. Diffuse interstitial and scattered patchy airspace opacities are again noted and appear unchanged from previous exam. Visualized osseous structures are unremarkable. IMPRESSION: 1. Interval decreased volume of left pleural effusion status post thoracentesis. No pneumothorax identified. 2. Stable diffuse interstitial and scattered patchy airspace opacities. Electronically Signed   By: Signa Kell M.D.   On: 2023/08/22 18:09   IR THORACENTESIS ASP PLEURAL SPACE W/IMG GUIDE  Result Date: August 22, 2023 INDICATION: Patient with history of left  breast cancer, right leg melanoma, recurrent malignant, symptomatic left pleural effusion; request received for therapeutic left thoracentesis EXAM: ULTRASOUND GUIDED THERAPEUTIC LEFT THORACENTESIS MEDICATIONS: 8 ml 1% lidocaine COMPLICATIONS: None immediate. PROCEDURE: An ultrasound guided thoracentesis was thoroughly discussed with the patient and  questions answered. The benefits, risks, alternatives and complications were also discussed. The patient understands and wishes to proceed with the procedure. Written consent was obtained. Ultrasound was performed to localize and mark an adequate pocket of fluid in the left chest. The area was then prepped and draped in the normal sterile fashion. 1% Lidocaine was used for local anesthesia. Under ultrasound guidance a 6 Fr Safe-T-Centesis catheter was introduced. Thoracentesis was performed. The catheter was removed and a dressing applied. FINDINGS: A total of approximately 710 cc of hazy,amber fluid was removed. IMPRESSION: Successful ultrasound guided therapeutic left thoracentesis yielding 710 cc of pleural fluid. Performed by: Artemio Aly Electronically Signed   By: Marliss Coots M.D.   On: Aug 20, 2023 16:49   DG Chest Portable 1 View  Result Date: August 20, 2023 CLINICAL DATA:  Shortness of breath. EXAM: PORTABLE CHEST 1 VIEW COMPARISON:  Chest radiograph 08/04/2023 FINDINGS: The cardiomediastinal silhouette is unchanged with normal heart size. Lung volumes remain low. Interstitial and airspace opacities throughout both lungs have not significantly changed. A moderate left pleural effusion may have mildly enlarged. No pneumothorax is identified. IMPRESSION: 1. Unchanged bilateral airspace disease. 2. Possible mild enlargement of the left pleural effusion. Electronically Signed   By: Sebastian Ache M.D.   On: 07/28/2023 15:35      LOS: 2 days    Jacquelin Hawking, MD [facility] 08/10/2023, 7:33 AM   If 7PM-7AM, please contact night-coverage [email or web address]

## 2023-08-11 ENCOUNTER — Other Ambulatory Visit: Payer: Self-pay

## 2023-08-11 ENCOUNTER — Inpatient Hospital Stay (HOSPITAL_COMMUNITY): Payer: BC Managed Care – PPO

## 2023-08-11 ENCOUNTER — Encounter (HOSPITAL_COMMUNITY): Admission: EM | Disposition: E | Payer: Self-pay | Source: Home / Self Care | Attending: Internal Medicine

## 2023-08-11 ENCOUNTER — Ambulatory Visit
Admission: RE | Admit: 2023-08-11 | Discharge: 2023-08-11 | Disposition: A | Discharge status: Home / Self Care | Payer: BC Managed Care – PPO | Source: Ambulatory Visit | Attending: Radiation Oncology | Admitting: Radiation Oncology

## 2023-08-11 DIAGNOSIS — J9621 Acute and chronic respiratory failure with hypoxia: Secondary | ICD-10-CM | POA: Diagnosis not present

## 2023-08-11 DIAGNOSIS — J910 Malignant pleural effusion: Secondary | ICD-10-CM | POA: Diagnosis not present

## 2023-08-11 DIAGNOSIS — J9601 Acute respiratory failure with hypoxia: Principal | ICD-10-CM

## 2023-08-11 LAB — RAD ONC ARIA SESSION SUMMARY
Course Elapsed Days: 10
Plan Fractions Treated to Date: 6
Plan Prescribed Dose Per Fraction: 3 Gy
Plan Total Fractions Prescribed: 10
Plan Total Prescribed Dose: 30 Gy
Reference Point Dosage Given to Date: 18 Gy
Reference Point Session Dosage Given: 3 Gy
Session Number: 6

## 2023-08-11 LAB — BASIC METABOLIC PANEL
Anion gap: 12 (ref 5–15)
BUN: 9 mg/dL (ref 6–20)
CO2: 29 mmol/L (ref 22–32)
Calcium: 8.8 mg/dL — ABNORMAL LOW (ref 8.9–10.3)
Chloride: 94 mmol/L — ABNORMAL LOW (ref 98–111)
Creatinine, Ser: 0.57 mg/dL (ref 0.44–1.00)
GFR, Estimated: >60 mL/min (ref >60)
Glucose, Bld: 105 mg/dL — ABNORMAL HIGH (ref 70–99)
Potassium: 3.4 mmol/L — ABNORMAL LOW (ref 3.5–5.1)
Sodium: 135 mmol/L (ref 135–145)

## 2023-08-11 SURGERY — INSERTION, PLEURAL DRAINAGE CATHETER
Anesthesia: Moderate Sedation

## 2023-08-11 MED ORDER — LIDOCAINE HCL (PF) 1 % IJ SOLN
30.0000 mL | Freq: Once | INTRAMUSCULAR | Status: DC
Start: 2023-08-11 — End: 2023-08-11

## 2023-08-11 MED ORDER — LIDOCAINE HCL 1 % IJ SOLN
30.0000 mL | Freq: Once | INTRAMUSCULAR | Status: DC
  Filled 2023-08-11: qty 40

## 2023-08-11 MED ORDER — PROMETHAZINE HCL 6.25 MG/5ML PO SOLN: 12.5000 mg | Freq: Four times a day (QID) | ORAL | Status: DC | PRN

## 2023-08-11 MED ORDER — SODIUM CHLORIDE 0.9 % IV SOLN: INTRAVENOUS | Status: DC | PRN

## 2023-08-11 MED ORDER — LORAZEPAM 1 MG PO TABS
1.0000 mg | ORAL_TABLET | Freq: Once | ORAL | Status: AC | PRN
  Administered 2023-08-11: 1 mg via ORAL
  Filled 2023-08-11: qty 1

## 2023-08-11 MED ORDER — POTASSIUM CHLORIDE CRYS ER 20 MEQ PO TBCR
40.0000 meq | EXTENDED_RELEASE_TABLET | Freq: Once | ORAL | Status: AC
  Administered 2023-08-11: 40 meq via ORAL
  Filled 2023-08-11: qty 2

## 2023-08-11 MED ORDER — HYDROXYZINE HCL 25 MG PO TABS
25.0000 mg | ORAL_TABLET | Freq: Four times a day (QID) | ORAL | Status: DC | PRN
  Administered 2023-08-11 – 2023-08-17 (×7): 25 mg via ORAL
  Filled 2023-08-11 (×7): qty 1

## 2023-08-11 MED ORDER — POTASSIUM CHLORIDE 10 MEQ/100ML IV SOLN
10.0000 meq | INTRAVENOUS | Status: DC
  Administered 2023-08-11: 10 meq via INTRAVENOUS
  Filled 2023-08-11: qty 100

## 2023-08-11 MED ORDER — HEPARIN SODIUM (PORCINE) 5000 UNIT/ML IJ SOLN
5000.0000 [IU] | Freq: Three times a day (TID) | INTRAMUSCULAR | Status: DC
  Administered 2023-08-11 – 2023-08-20 (×23): 5000 [IU] via SUBCUTANEOUS
  Filled 2023-08-11 (×24): qty 1

## 2023-08-11 NOTE — Plan of Care (Signed)
Plan of care and goals discussed with patient, also aware of procedure in the morning, needing to be NPO after midnight, time given for questions and answers, patient handbook/guide at bedside.  Problem: Education: Goal: Knowledge of General Education information will improve Description: Including pain rating scale, medication(s)/side effects and non-pharmacologic comfort measures Outcome: Progressing   Problem: Health Behavior/Discharge Planning: Goal: Ability to manage health-related needs will improve Outcome: Progressing   Problem: Clinical Measurements: Goal: Ability to maintain clinical measurements within normal limits will improve Outcome: Progressing Goal: Will remain free from infection Outcome: Progressing Goal: Diagnostic test results will improve Outcome: Progressing Goal: Respiratory complications will improve Outcome: Progressing Goal: Cardiovascular complication will be avoided Outcome: Progressing   Problem: Activity: Goal: Risk for activity intolerance will decrease Outcome: Progressing   Problem: Nutrition: Goal: Adequate nutrition will be maintained Outcome: Progressing   Problem: Coping: Goal: Level of anxiety will decrease Outcome: Progressing   Problem: Elimination: Goal: Will not experience complications related to bowel motility Outcome: Progressing Goal: Will not experience complications related to urinary retention Outcome: Progressing   Problem: Pain Managment: Goal: General experience of comfort will improve Outcome: Progressing   Problem: Safety: Goal: Ability to remain free from injury will improve Outcome: Progressing   Problem: Skin Integrity: Goal: Risk for impaired skin integrity will decrease Outcome: Progressing

## 2023-08-11 NOTE — Progress Notes (Signed)
PROGRESS NOTE    Sherri Schultz  YQM:[id number] DOB: June 23, 1966 DOA: 08/09/2023 PCP: Elizabeth Palau, FNP   Brief Narrative: Sherri Schultz is a 57 y.o. female with a history of OSA< breast cancer, migraine headaches, right leg melanoma, anxiety, hypertension, malignant pleural effusion.  Patient presented secondary to worsening shortness of breath and found to have recurrent left sided pleural effusion requiring thoracentesis. Some improvement immediately after thoracentesis with worsening dyspnea likely related to re-accumulation.   Assessment and Plan:  Acute on chronic respiratory failure with hypoxia Secondary to recurrent pleural effusion. Patient uses 2-3 L/min at baseline, but has required non-rebreather on admission. She was able to wean down to 5 L/min after thoracentesis. Still with significant dyspnea with mobility. Oxygen requirement now increased to 10 L/min -Management of pleural effusion -Wean to 3 L/min as able -Ambulatory pulse ox once able  Recurrent left-sided malignant pleural effusion Patient underwent successful left-sided thoracentesis on 9/13 yielding 0.7 liters of hazy/amber fluid. Discussed with PCCM who will perform a bedside ultrasound to reassess fluid re-accumulation. Plan for PleurX catheter per pulmonology -Lasix IV -PCCM recommendations: Pleurx catheter placement tentatively planned for 08-14-23  Bilateral shoulder and neck pain Cervical spine metastasis Patient is receiving radiation therapy to spine. -Continue oxycodone and tizanidine PRN  Leukocytosis Mild. Improved. No evidence of acute infection.  Anxiety -Continue Effexor  GERD -Continue Protonix  Tachycardia Chronic. Likely related to poor conditioning. Patient was evaluated for acute PE on recent admission revealing no evidence of PE. Resolved.  Breast cancer Melanoma Noted.  OSA Noted   DVT prophylaxis: Lovenox Code Status:   Code Status: Full  Code Family Communication: None at bedside Disposition Plan: Discharge home likely not for 2-3 days pending improvement of respiratory failure, likely Pleurx catheter placement   Consultants:  PCCM Interventional radiology  Procedures:  9/13: Left thoracentesis  Antimicrobials: None    Subjective: Continued cough. She feels that her breathing is better and hoping it is not solely because of the increase in oxygen supplementation.  Objective: BP 106/64   Pulse 87   Temp 97.9 F (36.6 C) (Oral)   Resp (!) 28   Ht 5' 4.5" (1.638 m)   Wt 72.2 kg   LMP 03/27/2011   SpO2 92%   BMI 26.90 kg/m   Examination:  General exam: Appears calm and comfortable Respiratory system: Diminished breath sounds. Respiratory effort normal. Cardiovascular system: S1 & S2 heard, RRR. Gastrointestinal system: Abdomen is nondistended, soft and nontender. Normal bowel sounds heard. Central nervous system: Alert and oriented. Musculoskeletal: No edema. No calf tenderness Psychiatry: Judgement and insight appear normal. Mood & affect appropriate.    Data Reviewed: I have personally reviewed following labs and imaging studies  CBC Lab Results  Component Value Date   WBC 10.6 (H) 08/09/2023   RBC 3.92 08/09/2023   HGB 11.6 (L) 08/09/2023   HCT 37.7 08/09/2023   MCV 96.2 08/09/2023   MCH 29.6 08/09/2023   PLT 290 08/09/2023   MCHC 30.8 08/09/2023   RDW 13.0 08/09/2023   LYMPHSABS 0.7 08/14/2023   MONOABS 1.2 (H) 08/12/2023   EOSABS 0.5 08/15/2023   BASOSABS 0.1 07/28/2023     Last metabolic panel Lab Results  Component Value Date   NA 135 08-14-23   K 3.4 (L) 08/14/2023   CL 94 (L) 2023-08-14   CO2 29 08-14-23   BUN 9 08-14-2023   CREATININE 0.57 08-14-23   GLUCOSE 105 (H) 2023-08-14   GFRNONAA >60 08/14/2023  GFRAA >60 12/22/2019   CALCIUM 8.8 (L) 08/17/2023   PROT 6.3 (L) 07/30/2023   ALBUMIN 2.9 (L) 07/30/2023   BILITOT 0.2 (L) 07/30/2023   ALKPHOS 79 07/30/2023    AST 23 07/30/2023   ALT 12 07/30/2023   ANIONGAP 12 08/18/2023    GFR: Estimated Creatinine Clearance: 77.4 mL/min (by C-G formula based on SCr of 0.57 mg/dL).  Recent Results (from the past 240 hour(s))  SARS Coronavirus 2 by RT PCR (hospital order, performed in [facility] hospital lab) *cepheid single result test* Anterior Nasal Swab     Status: None   Collection Time: 07/31/2023  1:48 PM   Specimen: Anterior Nasal Swab  Result Value Ref Range Status   SARS Coronavirus 2 by RT PCR NEGATIVE NEGATIVE Final    Comment: (NOTE) SARS-CoV-2 target nucleic acids are NOT DETECTED.  The SARS-CoV-2 RNA is generally detectable in upper and lower respiratory specimens during the acute phase of infection. The lowest concentration of SARS-CoV-2 viral copies this assay can detect is 250 copies / mL. A negative result does not preclude SARS-CoV-2 infection and should not be used as the sole basis for treatment or other patient management decisions.  A negative result may occur with improper specimen collection / handling, submission of specimen other than nasopharyngeal swab, presence of viral mutation(s) within the areas targeted by this assay, and inadequate number of viral copies (<250 copies / mL). A negative result must be combined with clinical observations, patient history, and epidemiological information.  Fact Sheet for Patients:   RoadLapTop.co.za  Fact Sheet for Healthcare Providers: http://kim-miller.com/  This test is not yet approved or  cleared by the [place] FDA and has been authorized for detection and/or diagnosis of SARS-CoV-2 by FDA under an Emergency Use Authorization (EUA).  This EUA will remain in effect (meaning this test can be used) for the duration of the COVID-19 declaration under Section 564(b)(1) of the Act, 21 U.S.C. section 360bbb-3(b)(1), unless the authorization is terminated or revoked  sooner.  Performed at [facility], [address], [place], [place] [address]   MRSA Next Gen by PCR, Nasal     Status: None   Collection Time: 08/09/23  3:41 AM   Specimen: Nasal Mucosa; Nasal Swab  Result Value Ref Range Status   MRSA by PCR Next Gen NOT DETECTED NOT DETECTED Final    Comment: (NOTE) The GeneXpert MRSA Assay (FDA approved for NASAL specimens only), is one component of a comprehensive MRSA colonization surveillance program. It is not intended to diagnose MRSA infection nor to guide or monitor treatment for MRSA infections. Test performance is not FDA approved in patients less than 83 years old. Performed at [facility], [address], [place], [place] [address]       Radiology Studies: DG Chest Port 1 View  Result Date: 08/09/2023 CLINICAL DATA:  Hypoxia, respiratory distress and history of recurrent left malignant pleural effusion related to breast carcinoma and status post thoracentesis on 08/17/2023. EXAM: PORTABLE CHEST 1 VIEW COMPARISON:  07/30/2023 FINDINGS: Stable heart size and mediastinal contours. No significant reaccumulation of left pleural fluid after thoracentesis 3 days ago. No right-sided pleural fluid. Multifocal airspace disease again noted in both lungs, especially in both upper lobes which appears more prominent compared to the prior study and may reflect acute pneumonia. IMPRESSION: 1. No significant reaccumulation of left pleural fluid after thoracentesis 3 days ago. 2. Multifocal airspace disease in both lungs, especially in both upper lobes which  appears more prominent compared to the prior study and may reflect acute pneumonia. Electronically Signed   By: Irish Lack M.D.   On: 17-Aug-2023 08:31      LOS: 3 days    Jacquelin Hawking, MD [facility] 08/17/2023, 9:13 AM   If 7PM-7AM, please contact night-coverage [email or web address]

## 2023-08-11 NOTE — Telephone Encounter (Signed)
I spoke to her last week. She was directed to the ED for eval.

## 2023-08-11 NOTE — Progress Notes (Signed)
NAME:  Sherri Schultz, MRN:  [id number], DOB:  [birth year], LOS: 3 ADMISSION DATE:  Aug 19, 2023, CONSULTATION DATE:  08/19/2023 REFERRING MD:  Caleb Popp - TRH, CHIEF COMPLAINT:  DOE   History of Present Illness:  57 year old woman with history malignant pleural effusion, chronic hypoxemia on 3 L nasal cannula at home presents with worsening dyspnea and hypoxemia. PMHx significant for HTN, OSA, L breast CA (DCIS), GERD, anxiety.  Contacted pulmonary office.  Has been trying to set up outpatient Pleurx.  Worsening symptoms but unable to get Pleurx done in short notice.  Advised to go to ED.  Chest x-ray showed loculated left pleural effusion on my review interpretation.  As well as scattered multifocal infiltrates consistent with parenchymal lung disease, cancerous signs on my interpretation.  Underwent thoracentesis with IR.  Improvement in hypoxemia.  From nonrebreather down to 4 L at time of evaluation.  Sats high 90s.  Reports improved dyspnea as well.  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   Cancer (HCC) 2013   right knee   Dyspnea    Family history of brain cancer    Family history of breast cancer    Family history of colon cancer    Family history of melanoma    GERD (gastroesophageal reflux disease)    Headache    Hypertension    Personal history of radiation therapy    Sleep apnea    Trimalleolar fracture of ankle, closed, left, initial encounter    Significant Hospital Events: Including procedures, antibiotic start and stop dates in addition to other pertinent events   2023/08/19 Admitted to the hospital with worsening dyspnea on exertion and hypoxemia with recurrent left pleural effusion, improvement in symptoms and hypoxemia after thoracentesis 9/16 Stable O2 requirements, 10L [place].  Interim History / Subjective:  No significant events overnight Stable O2 requirement, 10L  [place] Mild tachypnea CXR without significant reaccummulation of fluid s/p thora 19-Aug-2023, +multifocal airspace disease Plan for PleurX placement today for definitive management of recurrent effusion  Objective:   Blood pressure 106/61, pulse 86, temperature 98 F (36.7 C), temperature source Oral, resp. rate (!) 25, height 5' 4.5" (1.638 m), weight 72.2 kg, last menstrual period 03/27/2011, SpO2 93%.        Intake/Output Summary (Last 24 hours) at 08/02/2023 0748 Last data filed at 08/10/2023 2200 Gross per 24 hour  Intake 480 ml  Output 800 ml  Net -320 ml   Filed Weights   2023-08-19 1245 08/09/23 0340 08/10/23 1930  Weight: 78.4 kg 73.1 kg 72.2 kg   Physical Examination: General: Chronically ill-appearing middle-aged woman in NAD. HEENT: [place]/AT, anicteric sclera, PERRL, moist mucous membranes. Neuro: Awake, oriented x 4. Responds to verbal stimuli. Following commands consistently. Moves all 4 extremities spontaneously. Generalized weakness. CV: RRR, no m/g/r. PULM: Breathing even and minimally labored on 10L [place]. Lung fields diminished on L, occasional wheeze. GI: Soft, nontender, nondistended. Extremities: Trace bilateral LE edema noted. Skin: Warm/dry, no rashes.  Resolved Hospital Problem List:     Assessment & Plan:  Recurrent left-sided effusion, malignant Cyto 9/9 IHC stains demonstrating tumor cells positive  for CK7, focal PAX8, and focal GATA3 and negative for CDX2 and CK20, indicating possible breast/mullerian origin.  - S/p IR thoracentesis 08-19-2023 with of amber fluid removed - Repeat CXR 9/16AM - Tentative plan for PleurX today, 9/16 (Dr. Chestine Spore) if fluid pocket amenable  to placement - Lovenox HELD in anticipation of procedure  Acute-on-chronic hypoxemic respiratory failure Due to likely compressive atelectasis from effusion and shunt with inability to compensate given disease parenchyma. - Improved post-thora 9/13, now with slowly uptrending RR/tachypnea -  Supplemental O2 support, goal sat > 88% - Pulmonary hygiene  Best Practice (right click and "Reselect all SmartList Selections" daily)   Per Primary Team  Critical care time: N/A   Faythe Ghee [facility] September 05, 2023 7:48 AM  Please see [email or web address] for pager details.  From 7A-7P if no response, please call [phone] After hours, please call ELink [phone]

## 2023-08-11 NOTE — Plan of Care (Signed)

## 2023-08-11 NOTE — Progress Notes (Signed)
08/21/2023 2117  BiPAP/CPAP/SIPAP  BiPAP/CPAP/SIPAP Pt Type Adult  Reason BIPAP/CPAP not in use Non-compliant

## 2023-08-12 ENCOUNTER — Other Ambulatory Visit: Payer: Self-pay

## 2023-08-12 ENCOUNTER — Ambulatory Visit
Admission: RE | Admit: 2023-08-12 | Discharge: 2023-08-12 | Disposition: A | Discharge status: Home / Self Care | Payer: BC Managed Care – PPO | Source: Ambulatory Visit | Attending: Radiation Oncology

## 2023-08-12 DIAGNOSIS — J9601 Acute respiratory failure with hypoxia: Secondary | ICD-10-CM | POA: Diagnosis not present

## 2023-08-12 DIAGNOSIS — J910 Malignant pleural effusion: Secondary | ICD-10-CM | POA: Diagnosis not present

## 2023-08-12 LAB — BASIC METABOLIC PANEL
Anion gap: 14 (ref 5–15)
BUN: 11 mg/dL (ref 6–20)
CO2: 24 mmol/L (ref 22–32)
Calcium: 8.6 mg/dL — ABNORMAL LOW (ref 8.9–10.3)
Chloride: 94 mmol/L — ABNORMAL LOW (ref 98–111)
Creatinine, Ser: 0.65 mg/dL (ref 0.44–1.00)
GFR, Estimated: >60 mL/min (ref >60)
Glucose, Bld: 100 mg/dL — ABNORMAL HIGH (ref 70–99)
Potassium: 3.9 mmol/L (ref 3.5–5.1)
Sodium: 132 mmol/L — ABNORMAL LOW (ref 135–145)

## 2023-08-12 LAB — RAD ONC ARIA SESSION SUMMARY
Course Elapsed Days: 11
Plan Fractions Treated to Date: 7
Plan Prescribed Dose Per Fraction: 3 Gy
Plan Total Fractions Prescribed: 10
Plan Total Prescribed Dose: 30 Gy
Reference Point Dosage Given to Date: 21 Gy
Reference Point Session Dosage Given: 3 Gy
Session Number: 7

## 2023-08-12 LAB — CBC
HCT: 35.4 % — ABNORMAL LOW (ref 36.0–46.0)
Hemoglobin: 11.1 g/dL — ABNORMAL LOW (ref 12.0–15.0)
MCH: 29.7 pg (ref 26.0–34.0)
MCHC: 31.4 g/dL (ref 30.0–36.0)
MCV: 94.7 fL (ref 80.0–100.0)
Platelets: 244 10*3/uL (ref 150–400)
RBC: 3.74 MIL/uL — ABNORMAL LOW (ref 3.87–5.11)
RDW: 12.5 % (ref 11.5–15.5)
WBC: 10 10*3/uL (ref 4.0–10.5)
nRBC: 0 % (ref 0.0–0.2)

## 2023-08-12 LAB — MAGNESIUM: Magnesium: 1.9 mg/dL (ref 1.7–2.4)

## 2023-08-12 LAB — PHOSPHORUS: Phosphorus: 3.6 mg/dL (ref 2.5–4.6)

## 2023-08-12 MED ORDER — INFLUENZA VIRUS VACC SPLIT PF (FLUZONE) 0.5 ML IM SUSY
0.5000 mL | PREFILLED_SYRINGE | INTRAMUSCULAR | Status: DC
  Filled 2023-08-12: qty 0.5

## 2023-08-12 MED ORDER — ORAL CARE MOUTH RINSE: 15.0000 mL | OROMUCOSAL | Status: DC | PRN

## 2023-08-12 MED ORDER — SILVER SULFADIAZINE 1 % EX CREA
TOPICAL_CREAM | Freq: Every day | CUTANEOUS | Status: DC
  Administered 2023-08-12 – 2023-08-17 (×3): 1 via TOPICAL
  Filled 2023-08-12: qty 50

## 2023-08-12 NOTE — Plan of Care (Signed)

## 2023-08-12 NOTE — Progress Notes (Signed)
PROGRESS NOTE    Sherri Schultz  ZOX:[id number] DOB: 08/08/1966 DOA: 08/24/2023 PCP: Sherri Palau, FNP   Brief Narrative: Sherri Schultz is a 57 y.o. female with a history of OSA< breast cancer, migraine headaches, right leg melanoma, anxiety, hypertension, malignant pleural effusion.  Patient presented secondary to worsening shortness of breath and found to have recurrent left sided pleural effusion requiring thoracentesis. Some improvement immediately after thoracentesis with worsening dyspnea likely related to re-accumulation.   Assessment and Plan:  Acute on chronic respiratory failure with hypoxia Secondary to recurrent pleural effusion. Patient uses 2-3 L/min at baseline, but has required non-rebreather on admission. She was able to wean down to 5 L/min after thoracentesis. Still with significant dyspnea with mobility. Oxygen requirement now increased to 10 L/min and stable. -Management of pleural effusion -Wean to 3 L/min as able -Ambulatory pulse ox once able  Recurrent left-sided malignant pleural effusion Patient underwent successful left-sided thoracentesis on 9/13 yielding 0.7 liters of hazy/amber fluid. Discussed with PCCM who will perform a bedside ultrasound to reassess fluid re-accumulation. Plan for PleurX catheter per pulmonology -Lasix IV -Daily BMP while on Lasix -PCCM recommendations: Pleurx catheter placement tentatively planned for 9/17 if enough fluid has accumulated  Cough -Tussionex prn -Phenergan syrup prn  Bilateral shoulder and neck pain Cervical spine metastasis Patient is receiving radiation therapy to spine. -Continue oxycodone and tizanidine PRN  Leukocytosis Mild. Improved. No evidence of acute infection.  Anxiety -Continue Effexor  GERD -Continue Protonix  Tachycardia Chronic. Likely related to poor conditioning. Patient was evaluated for acute PE on recent admission revealing no evidence of PE.  Resolved.  Breast cancer Melanoma Noted.  OSA Noted   DVT prophylaxis: Lovenox Code Status:   Code Status: Full Code Family Communication: None at bedside Disposition Plan: Discharge home likely not for 2-3 days pending improvement of respiratory failure, likely Pleurx catheter placement   Consultants:  PCCM Interventional radiology  Procedures:  9/13: Left thoracentesis  Antimicrobials: None    Subjective: Cough has improved this morning after taking cough syrup. No dyspnea at rest.  Objective: BP 102/63   Pulse 89   Temp 99.1 F (37.3 C) (Oral)   Resp (!) 24   Ht 5' 4.5" (1.638 m)   Wt 72.2 kg   LMP 03/27/2011   SpO2 94%   BMI 26.90 kg/m   Examination:  General exam: Appears calm and comfortable Respiratory Schultz: Clear to auscultation on right and diminished on left. Respiratory effort normal. Cardiovascular Schultz: S1 & S2 heard, RRR. Gastrointestinal Schultz: Abdomen is nondistended, soft and nontender. Normal bowel sounds heard. Central nervous Schultz: Alert and oriented. No focal neurological deficits. Musculoskeletal: No calf tenderness Skin: No cyanosis. No rashes Psychiatry: Judgement and insight appear normal. Mood & affect appropriate.    Data Reviewed: I have personally reviewed following labs and imaging studies  CBC Lab Results  Component Value Date   WBC 10.0 08/12/2023   RBC 3.74 (L) 08/12/2023   HGB 11.1 (L) 08/12/2023   HCT 35.4 (L) 08/12/2023   MCV 94.7 08/12/2023   MCH 29.7 08/12/2023   PLT 244 08/12/2023   MCHC 31.4 08/12/2023   RDW 12.5 08/12/2023   LYMPHSABS 0.7 08/01/2023   MONOABS 1.2 (H) 07/27/2023   EOSABS 0.5 08/14/2023   BASOSABS 0.1 07/30/2023     Last metabolic panel Lab Results  Component Value Date   NA 132 (L) 08/12/2023   K 3.9 08/12/2023   CL 94 (L) 08/12/2023   CO2  24 08/12/2023   BUN 11 08/12/2023   CREATININE 0.65 08/12/2023   GLUCOSE 100 (H) 08/12/2023   GFRNONAA >60 08/12/2023   GFRAA >60  12/22/2019   CALCIUM 8.6 (L) 08/12/2023   PHOS 3.6 08/12/2023   PROT 6.3 (L) 07/30/2023   ALBUMIN 2.9 (L) 07/30/2023   BILITOT 0.2 (L) 07/30/2023   ALKPHOS 79 07/30/2023   AST 23 07/30/2023   ALT 12 07/30/2023   ANIONGAP 14 08/12/2023    GFR: Estimated Creatinine Clearance: 77.4 mL/min (by C-G formula based on SCr of 0.65 mg/dL).  Recent Results (from the past 240 hour(s))  SARS Coronavirus 2 by RT PCR (hospital order, performed in [facility] hospital lab) *cepheid single result test* Anterior Nasal Swab     Status: None   Collection Time: 08-16-23  1:48 PM   Specimen: Anterior Nasal Swab  Result Value Ref Range Status   SARS Coronavirus 2 by RT PCR NEGATIVE NEGATIVE Final    Comment: (NOTE) SARS-CoV-2 target nucleic acids are NOT DETECTED.  The SARS-CoV-2 RNA is generally detectable in upper and lower respiratory specimens during the acute phase of infection. The lowest concentration of SARS-CoV-2 viral copies this assay can detect is 250 copies / mL. A negative result does not preclude SARS-CoV-2 infection and should not be used as the sole basis for treatment or other patient management decisions.  A negative result may occur with improper specimen collection / handling, submission of specimen other than nasopharyngeal swab, presence of viral mutation(s) within the areas targeted by this assay, and inadequate number of viral copies (<250 copies / mL). A negative result must be combined with clinical observations, patient history, and epidemiological information.  Fact Sheet for Patients:   RoadLapTop.co.za  Fact Sheet for Healthcare Providers: http://kim-miller.com/  This test is not yet approved or  cleared by the [place] FDA and has been authorized for detection and/or diagnosis of SARS-CoV-2 by FDA under an Emergency Use Authorization (EUA).  This EUA will remain in effect (meaning this test can be used) for the  duration of the COVID-19 declaration under Section 564(b)(1) of the Act, 21 U.S.C. section 360bbb-3(b)(1), unless the authorization is terminated or revoked sooner.  Performed at [facility], [address], [place], [place] [address]   MRSA Next Gen by PCR, Nasal     Status: None   Collection Time: 08/09/23  3:41 AM   Specimen: Nasal Mucosa; Nasal Swab  Result Value Ref Range Status   MRSA by PCR Next Gen NOT DETECTED NOT DETECTED Final    Comment: (NOTE) The GeneXpert MRSA Assay (FDA approved for NASAL specimens only), is one component of a comprehensive MRSA colonization surveillance program. It is not intended to diagnose MRSA infection nor to guide or monitor treatment for MRSA infections. Test performance is not FDA approved in patients less than 23 years old. Performed at [facility], [address] [place], [place] [address]       Radiology Studies: DG Chest Port 1 View  Result Date: 08/01/2023 CLINICAL DATA:  Hypoxia, respiratory distress and history of recurrent left malignant pleural effusion related to breast carcinoma and status post thoracentesis on 08-16-2023. EXAM: PORTABLE CHEST 1 VIEW COMPARISON:  16-Aug-2023 FINDINGS: Stable heart size and mediastinal contours. No significant reaccumulation of left pleural fluid after thoracentesis 3 days ago. No right-sided pleural fluid. Multifocal airspace disease again noted in both lungs, especially in both upper lobes which appears more prominent compared to the prior study and may  reflect acute pneumonia. IMPRESSION: 1. No significant reaccumulation of left pleural fluid after thoracentesis 3 days ago. 2. Multifocal airspace disease in both lungs, especially in both upper lobes which appears more prominent compared to the prior study and may reflect acute pneumonia. Electronically Signed   By: Irish Lack M.D.   On: 08-15-2023 08:31      LOS: 4 days    Jacquelin Hawking, MD [facility] 08/12/2023, 8:24 AM   If 7PM-7AM, please contact night-coverage [email or web address]

## 2023-08-12 NOTE — Progress Notes (Signed)
   08/12/23 1521  TOC Brief Assessment  Insurance and Status Reviewed  Patient has primary care physician Yes  Home environment has been reviewed home with spouse  Prior level of function: independent (on O2)  Prior/Current Home Services No current home services  Social Determinants of Health Reivew SDOH reviewed no interventions necessary  Readmission risk has been reviewed Yes  Transition of care needs no transition of care needs at this time

## 2023-08-12 NOTE — Plan of Care (Signed)
  Problem: Education: Goal: Knowledge of General Education information will improve Description: Including pain rating scale, medication(s)/side effects and non-pharmacologic comfort measures Outcome: Progressing   Problem: Health Behavior/Discharge Planning: Goal: Ability to manage health-related needs will improve Outcome: Progressing   Problem: Clinical Measurements: Goal: Ability to maintain clinical measurements within normal limits will improve Outcome: Progressing Goal: Will remain free from infection Outcome: Progressing Goal: Diagnostic test results will improve Outcome: Progressing Goal: Respiratory complications will improve Outcome: Progressing Goal: Cardiovascular complication will be avoided Outcome: Progressing   Problem: Activity: Goal: Risk for activity intolerance will decrease Outcome: Progressing   Problem: Nutrition: Goal: Adequate nutrition will be maintained Outcome: Progressing   Problem: Coping: Goal: Level of anxiety will decrease Outcome: Not Progressing  Increased anxiety - Atarax ordered prn  Problem: Elimination: Goal: Will not experience complications related to bowel motility Outcome: Progressing Goal: Will not experience complications related to urinary retention Outcome: Progressing   Problem: Pain Managment: Goal: General experience of comfort will improve Outcome: Progressing   Problem: Safety: Goal: Ability to remain free from injury will improve Outcome: Progressing   Problem: Skin Integrity: Goal: Risk for impaired skin integrity will decrease Outcome: Progressing   Cindy S. Clelia Croft BSN, RN, CCRP, CCRN 08/12/2023 4:37 AM

## 2023-08-12 NOTE — Progress Notes (Signed)
   08/12/23 2014  BiPAP/CPAP/SIPAP  BiPAP/CPAP/SIPAP Pt Type Adult  Reason BIPAP/CPAP not in use Non-compliant (Pt continues to refuse CPAP qhs)

## 2023-08-12 NOTE — Progress Notes (Signed)
NAME:  Sherri Schultz, MRN:  [id number], DOB:  11/29/1965, LOS: 4 ADMISSION DATE:  08/18/2023, CONSULTATION DATE:  08/12/23 REFERRING MD:  Caleb Popp - TRH, CHIEF COMPLAINT:  DOE   History of Present Illness:  57 year old woman with history malignant pleural effusion, chronic hypoxemia on 3 L nasal cannula at home presents with worsening dyspnea and hypoxemia. PMHx significant for HTN, OSA, L breast CA (DCIS), GERD, anxiety.  Contacted pulmonary office.  Has been trying to set up outpatient Pleurx.  Worsening symptoms but unable to get Pleurx done in short notice.  Advised to go to ED.  Chest x-ray showed loculated left pleural effusion on my review interpretation.  As well as scattered multifocal infiltrates consistent with parenchymal lung disease, cancerous signs on my interpretation.  Underwent thoracentesis with IR.  Improvement in hypoxemia.  From nonrebreather down to 4 L at time of evaluation.  Sats high 90s.  Reports improved dyspnea as well.  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   Cancer (HCC) 2013   right knee   Dyspnea    Family history of brain cancer    Family history of breast cancer    Family history of colon cancer    Family history of melanoma    GERD (gastroesophageal reflux disease)    Headache    Hypertension    Personal history of radiation therapy    Sleep apnea    Trimalleolar fracture of ankle, closed, left, initial encounter    Significant Hospital Events: Including procedures, antibiotic start and stop dates in addition to other pertinent events   9/13 Admitted to the hospital with worsening dyspnea on exertion and hypoxemia with recurrent left pleural effusion, improvement in symptoms and hypoxemia after thoracentesis 9/16 Stable O2 requirements, 10L [place]. 9/17 Radiation today, tolerated well. O2 requirements stable.  Interim History / Subjective:  No  significant events overnight Feels fatigued, overall doing ok Radiation today, tolerated well - resting O2 requirements remain stable, 10L [place] Repeat bedside POCUS of L pleural space to assess for PleurX  Objective:   Blood pressure 102/63, pulse 89, temperature 98.7 F (37.1 C), temperature source Oral, resp. rate (!) 24, height 5' 4.5" (1.638 m), weight 72.2 kg, last menstrual period 03/27/2011, SpO2 94%.        Intake/Output Summary (Last 24 hours) at 08/12/2023 0801 Last data filed at 08/02/2023 2230 Gross per 24 hour  Intake 139.13 ml  Output 1050 ml  Net -910.87 ml   Filed Weights   08/12/2023 1245 08/09/23 0340 08/10/23 1930  Weight: 78.4 kg 73.1 kg 72.2 kg   Physical Examination: General: Chronically ill-appearing middle-aged woman in NAD. Appears fatigued. HEENT: [place]/AT, anicteric sclera, PERRL, moist mucous membranes. Neuro:  Awake, drowsy post-radiation.  Responds to verbal stimuli. Following commands consistently. Moves all 4 extremities spontaneously. CV: RRR, no m/g/r. PULM: Breathing mildly tachypneic but unlabored on 10L [place]. Lung fields diminished slightly on L. GI: Soft, nontender, nondistended. Extremities: No significant LE edema noted. Skin: Warm/dry, no rashes.  Resolved Hospital Problem List:     Assessment & Plan:  Recurrent left-sided effusion, malignant Cyto 9/9 IHC stains demonstrating tumor cells positive for CK7, focal PAX8, and focal GATA3 and negative for CDX2 and CK20, indicating possible breast/mullerian origin. S/p IR thoracentesis 9/13 with of amber fluid removed. - Unable to place PleurX yesterday in the setting of too little fluid/no adequate  pocket - Plan for repeat bedside POCUS today to assess lateral L pleural space  Acute-on-chronic hypoxemic respiratory failure Due to likely compressive atelectasis from effusion and shunt with inability to compensate given disease parenchyma. - Continue supplemental O2 support, goal sat  >88% - Wean O2 for sat > 90% - Pulmonary hygiene  Best Practice (right click and "Reselect all SmartList Selections" daily)   Per Primary Team  Critical care time: N/A   Faythe Ghee [facility] 08/12/23 8:01 AM  Please see [email or web address] for pager details.  From 7A-7P if no response, please call [phone] After hours, please call ELink [phone]

## 2023-08-13 ENCOUNTER — Other Ambulatory Visit: Payer: Self-pay

## 2023-08-13 ENCOUNTER — Ambulatory Visit
Admission: RE | Admit: 2023-08-13 | Discharge: 2023-08-13 | Disposition: A | Discharge status: Home / Self Care | Payer: BC Managed Care – PPO | Source: Ambulatory Visit | Attending: Radiation Oncology

## 2023-08-13 ENCOUNTER — Ambulatory Visit: Payer: BC Managed Care – PPO

## 2023-08-13 ENCOUNTER — Inpatient Hospital Stay (HOSPITAL_COMMUNITY): Payer: BC Managed Care – PPO

## 2023-08-13 DIAGNOSIS — R0603 Acute respiratory distress: Secondary | ICD-10-CM | POA: Diagnosis not present

## 2023-08-13 DIAGNOSIS — J910 Malignant pleural effusion: Secondary | ICD-10-CM | POA: Diagnosis not present

## 2023-08-13 DIAGNOSIS — J9601 Acute respiratory failure with hypoxia: Secondary | ICD-10-CM | POA: Diagnosis not present

## 2023-08-13 LAB — RAD ONC ARIA SESSION SUMMARY
Course Elapsed Days: 12
Plan Fractions Treated to Date: 8
Plan Prescribed Dose Per Fraction: 3 Gy
Plan Total Fractions Prescribed: 10
Plan Total Prescribed Dose: 30 Gy
Reference Point Dosage Given to Date: 24 Gy
Reference Point Session Dosage Given: 3 Gy
Session Number: 8

## 2023-08-13 MED ORDER — METHYLPREDNISOLONE SODIUM SUCC 40 MG IJ SOLR
40.0000 mg | Freq: Every day | INTRAMUSCULAR | Status: DC
  Administered 2023-08-13 – 2023-08-20 (×8): 40 mg via INTRAVENOUS
  Filled 2023-08-13 (×8): qty 1

## 2023-08-13 MED ORDER — GUAIFENESIN 100 MG/5ML PO LIQD: 5.0000 mL | ORAL | Status: DC | PRN

## 2023-08-13 MED ORDER — SALINE SPRAY 0.65 % NA SOLN
1.0000 | NASAL | Status: DC | PRN
  Filled 2023-08-13: qty 44

## 2023-08-13 NOTE — Progress Notes (Signed)
   08/13/23 2023  BiPAP/CPAP/SIPAP  BiPAP/CPAP/SIPAP Pt Type Adult  Reason BIPAP/CPAP not in use Non-compliant (Patient continues to refuse CPAP qhs.)  BiPAP/CPAP /SiPAP Vitals  Pulse Rate 91  Resp (!) 22  SpO2 92 %  Bilateral Breath Sounds Clear;Diminished  MEWS Score/Color  MEWS Score 1  MEWS Score Color Green

## 2023-08-13 NOTE — Plan of Care (Signed)
  Problem: Health Behavior/Discharge Planning: Goal: Ability to manage health-related needs will improve Outcome: Progressing   Problem: Nutrition: Goal: Adequate nutrition will be maintained Outcome: Progressing   Problem: Coping: Goal: Level of anxiety will decrease Outcome: Progressing   Problem: Pain Managment: Goal: General experience of comfort will improve Outcome: Progressing   Problem: Skin Integrity: Goal: Risk for impaired skin integrity will decrease Outcome: Progressing   Problem: Clinical Measurements: Goal: Ability to maintain clinical measurements within normal limits will improve Outcome: Not Progressing Goal: Respiratory complications will improve Outcome: Not Progressing

## 2023-08-13 NOTE — Plan of Care (Signed)
  Problem: Education: Goal: Knowledge of General Education information will improve Description: Including pain rating scale, medication(s)/side effects and non-pharmacologic comfort measures Outcome: Progressing   Problem: Health Behavior/Discharge Planning: Goal: Ability to manage health-related needs will improve Outcome: Progressing   Problem: Clinical Measurements: Goal: Ability to maintain clinical measurements within normal limits will improve Outcome: Progressing Goal: Will remain free from infection Outcome: Progressing Goal: Diagnostic test results will improve Outcome: Progressing Goal: Respiratory complications will improve Outcome: Not Progressing Continues to need high flow O2 to maintain oxygenation Goal: Cardiovascular complication will be avoided Outcome: Progressing   Problem: Activity: Goal: Risk for activity intolerance will decrease Outcome: Progressing   Problem: Nutrition: Goal: Adequate nutrition will be maintained Outcome: Progressing   Problem: Coping: Goal: Level of anxiety will decrease Outcome: Progressing   Problem: Elimination: Goal: Will not experience complications related to bowel motility Outcome: Progressing Goal: Will not experience complications related to urinary retention Outcome: Progressing   Problem: Pain Managment: Goal: General experience of comfort will improve Outcome: Progressing   Problem: Safety: Goal: Ability to remain free from injury will improve Outcome: Progressing   Problem: Skin Integrity: Goal: Risk for impaired skin integrity will decrease Outcome: Progressing   Cindy S. Clelia Croft BSN, RN, CCRP, CCRN 08/13/2023 5:33 AM

## 2023-08-13 NOTE — Progress Notes (Signed)
NAME:  Sherri Schultz, MRN:  [id number], DOB:  1966-08-28, LOS: 5 ADMISSION DATE:  2023/08/30, CONSULTATION DATE:  08/13/23 REFERRING MD:  Caleb Popp - TRH, CHIEF COMPLAINT:  DOE   History of Present Illness:  57 year old woman with history malignant pleural effusion, chronic hypoxemia on 3 L nasal cannula at home presents with worsening dyspnea and hypoxemia. PMHx significant for HTN, OSA, L breast CA (DCIS), GERD, anxiety.  Contacted pulmonary office.  Has been trying to set up outpatient Pleurx.  Worsening symptoms but unable to get Pleurx done in short notice.  Advised to go to ED.  Chest x-ray showed loculated left pleural effusion on my review interpretation.  As well as scattered multifocal infiltrates consistent with parenchymal lung disease, cancerous signs on my interpretation.  Underwent thoracentesis with IR.  Improvement in hypoxemia.  From nonrebreather down to 4 L at time of evaluation.  Sats high 90s.  Reports improved dyspnea as well.  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   Cancer (HCC) 2013   right knee   Dyspnea    Family history of brain cancer    Family history of breast cancer    Family history of colon cancer    Family history of melanoma    GERD (gastroesophageal reflux disease)    Headache    Hypertension    Personal history of radiation therapy    Sleep apnea    Trimalleolar fracture of ankle, closed, left, initial encounter    Significant Hospital Events: Including procedures, antibiotic start and stop dates in addition to other pertinent events   08-30-2023 Admitted to the hospital with worsening dyspnea on exertion and hypoxemia with recurrent left pleural effusion, improvement in symptoms and hypoxemia after thoracentesis 9/16 Stable O2 requirements, 10L [place]. 9/17 Radiation today, tolerated well. O2 requirements stable.  Interim History / Subjective:  NAEON.  Feels about the same. On 15L O2.  Plan to repeat bedside POCUS of L pleural space to assess for PleurX  Objective:   Blood pressure 107/64, pulse 91, temperature 97.9 F (36.6 C), temperature source Oral, resp. rate 19, height 5' 4.5" (1.638 m), weight 72.2 kg, last menstrual period 03/27/2011, SpO2 91%.        Intake/Output Summary (Last 24 hours) at 08/13/2023 0805 Last data filed at 08/12/2023 1203 Gross per 24 hour  Intake --  Output 750 ml  Net -750 ml   Filed Weights   08-30-2023 1245 08/09/23 0340 08/10/23 1930  Weight: 78.4 kg 73.1 kg 72.2 kg   Physical Examination: General: Adult female, chronically ill appearing, resting in bed, in NAD. Neuro: A&O x 3, no deficits. HEENT: [place]/AT. Sclerae anicteric. EOMI. Cardiovascular: RRR, no M/R/G.  Lungs: Respirations even and unlabored.  CTA bilaterally, No W/R/R. Abdomen: BS x 4, soft, NT/ND.  Musculoskeletal: No gross deformities, no edema.  Skin: Intact, warm, no rashes.   Assessment & Plan:   Recurrent left-sided effusion, malignant Cyto 9/9 IHC stains demonstrating tumor cells positive for CK7, focal PAX8, and focal GATA3 and negative for CDX2 and CK20, indicating possible breast/mullerian origin. S/p IR thoracentesis 08-30-2023 with of amber fluid removed. - Plan for repeat bedside POCUS daily to assess lateral L pleural space for potential pleurX placement (has not reaccumulated enough yet).  Acute-on-chronic hypoxemic respiratory failure Due to likely compressive atelectasis from effusion and shunt with inability to compensate given disease parenchyma. -  Continue supplemental O2 support, goal sat >88% - Continue diuresis as able - Pulmonary hygiene  Rest per primary team.   Rutherford Guys, PA - C [facility] For pager details, please see AMION or use Epic chat  After 1900, please call ELINK for cross coverage needs 08/13/2023, 8:08 AM

## 2023-08-13 NOTE — Progress Notes (Signed)
PROGRESS NOTE Sherri Schultz  OZH:[id number] DOB: 16-Aug-1966 DOA: 08/22/2023 PCP: Elizabeth Palau, FNP  Brief Narrative/Hospital Course: Sherri Schultz is a 57 y.o. female with a history of OSA,breast cancer, migraine headaches, right leg melanoma, anxiety, hypertension, malignant pleural effusion presented secondary to worsening shortness of breath and found to have recurrent left sided pleural effusion requiring thoracentesis on admission on 08/22/23 and admitted to stepdown unit. Some improvement immediately after thoracentesis with worsening dyspnea likely related to re-accumulation.  Pulmonary critical care following.     Subjective: Patient seen and examined this morning resting comfortably no new complaints Overnight patient has been afebrile noted increasing oxygen requirement up to 15 L/min after she had choking episode on Mucinex tab and discontinued She is afebrile  Assessment and Plan: Principal Problem:   Acute respiratory distress Active Problems:   Acute respiratory failure with hypoxia (HCC)   Malignant pleural effusion   Acute on chronic respiratory failure with hypoxia Recurrent malignant left-sided pleural effusion Cytology 9/9-demonstrating tumor cells-tumor marker indicating possible breast/mllerian origin. Hypoxia in the setting of pleural effusion and compressive atelectasis. PCCM following closely.  IR thoracentesis on 2023-08-22 710 cc removed.  Unable to place Pleurx catheter as not enough fluid> some monitoring with repeat bedside progress daily to assess lateral left pleural space for potential Pleurx placement. continue supplement oxygen-overnight needing 15 L, choking episodes and Mucinex, obtain chest x-ray this morning. Cont  diuretics, nebulizer pulmonary hygiene PT OT  Continue plan per pulmonary.  Metastatic breast cancer with lung and pleural mets Bilateral shoulder and neck pain and cervical spine metastasis: Underwent radiation therapy  9/17.  Continue pain control with oral opiates, muscle relaxants.  Mild hyponatremia: Monitor Anemia of chronic disease likely from malignancy, hemoglobin overall stable, monitor Leukocytosis-resolved GERD-continue PPI Anxiety disorder: Continue Effexor Tachycardia, chronic likely in the setting of malignancy, deconditioning.  Resolved OSA: Patient reports she was supposed to get her CPAP machine after her diagnosis back in June but has not been approved and PCCM following up  DVT prophylaxis: heparin injection 5,000 Units Start: 08/10/2023 2200 Code Status:   Code Status: Full Code Family Communication: plan of care discussed with patient at bedside. Patient status is: Inpatient because of respiratory failure Level of care: Stepdown   Dispo: The patient is from: Home with family            Anticipated disposition: TBD pending improvement in respiratory failure  Objective: Vitals last 24 hrs: Vitals:   08/13/23 0616 08/13/23 0755 08/13/23 0800 08/13/23 1009  BP: 107/64  (!) 101/58 (!) 146/73  Pulse: 91  94   Resp: 19  (!) 29   Temp:  98.6 F (37 C)    TempSrc:  Oral    SpO2: 91%  90%   Weight:      Height:       Weight change:   Physical Examination: General exam: alert awake, older than stated age HEENT:Oral mucosa moist, Ear/Nose WNL grossly Respiratory system: Wheezing more on the right lung BS, no use of accessory muscle Cardiovascular system: S1 & S2 +, No JVD. Gastrointestinal system: Abdomen soft,NT,ND, BS+ Nervous System:Alert, awake, moving extremities. Extremities: LE edema neg,distal peripheral pulses palpable.  Skin: No rashes,no icterus. MSK: Normal muscle bulk,tone, power  Medications reviewed:  Scheduled Meds:  Chlorhexidine Gluconate Cloth  6 each Topical Q0600   furosemide  40 mg Intravenous Daily   guaiFENesin  600 mg Oral BID   heparin injection (subcutaneous)  5,000 Units Subcutaneous Q8H  influenza vac split trivalent PF  0.5 mL Intramuscular  Tomorrow-1000   ipratropium-albuterol  3 mL Nebulization BID   lidocaine  30 mL Intradermal Once   pantoprazole  40 mg Oral Daily   propranolol ER  80 mg Oral Daily   silver sulfADIAZINE   Topical Daily   venlafaxine XR  150 mg Oral Daily   Continuous Infusions:  sodium chloride Stopped (2023/09/03 4034)      Diet Order             Diet regular Room service appropriate? Yes; Fluid consistency: Thin  Diet effective now                            Intake/Output Summary (Last 24 hours) at 08/13/2023 1022 Last data filed at 08/12/2023 1203 Gross per 24 hour  Intake --  Output 750 ml  Net -750 ml   Net IO Since Admission: -2,890.39 mL [08/13/23 1022]  Wt Readings from Last 3 Encounters:  08/10/23 72.2 kg  08/04/23 74.8 kg  07/31/23 73.9 kg     Unresulted Labs (From admission, onward)     Start     Ordered   08/15/23 0500  Creatinine, serum  (enoxaparin (LOVENOX)    CrCl >/= 30 ml/min)  Weekly,   R     Comments: while on enoxaparin therapy    08/10/2023 1637   08/14/23 0500  Basic metabolic panel  Daily,   R     Question:  Specimen collection method  Answer:  Lab=Lab collect   08/13/23 0815   08/14/23 0500  CBC  Daily,   R     Question:  Specimen collection method  Answer:  Lab=Lab collect   08/13/23 0815          Data Reviewed: I have personally reviewed following labs and imaging studies CBC: Recent Labs  Lab 08/01/2023 1337 08/09/23 0705 08/12/23 0318  WBC 13.0* 10.6* 10.0  NEUTROABS 10.4*  --   --   HGB 12.8 11.6* 11.1*  HCT 39.8 37.7 35.4*  MCV 94.3 96.2 94.7  PLT PLATELET CLUMPS NOTED ON SMEAR, UNABLE TO ESTIMATE 290 244   Basic Metabolic Panel: Recent Labs  Lab 08/25/2023 1337 08/09/23 0705 08/10/23 0853 09/03/23 0325 08/12/23 0318  NA 134* 134* 132* 135 132*  K 4.0 3.4* 3.5 3.4* 3.9  CL 98 96* 95* 94* 94*  CO2 22 25 28 29 24   GLUCOSE 119* 98 111* 105* 100*  BUN 10 8 9 9 11   CREATININE 0.46 0.56 0.64 0.57 0.65  CALCIUM 9.0 8.7* 8.7*  8.8* 8.6*  MG  --   --   --   --  1.9  PHOS  --   --   --   --  3.6  GFR: Estimated Creatinine Clearance: 77.4 mL/min (by C-G formula based on SCr of 0.65 mg/dL). Recent Labs  Lab 08/07/2023 1355  LATICACIDVEN 1.6    Recent Results (from the past 240 hour(s))  SARS Coronavirus 2 by RT PCR (hospital order, performed in [facility] hospital lab) *cepheid single result test* Anterior Nasal Swab     Status: None   Collection Time: 08/12/2023  1:48 PM   Specimen: Anterior Nasal Swab  Result Value Ref Range Status   SARS Coronavirus 2 by RT PCR NEGATIVE NEGATIVE Final    Comment: (NOTE) SARS-CoV-2 target nucleic acids are NOT DETECTED.  The SARS-CoV-2 RNA is generally detectable in upper and lower respiratory specimens  during the acute phase of infection. The lowest concentration of SARS-CoV-2 viral copies this assay can detect is 250 copies / mL. A negative result does not preclude SARS-CoV-2 infection and should not be used as the sole basis for treatment or other patient management decisions.  A negative result may occur with improper specimen collection / handling, submission of specimen other than nasopharyngeal swab, presence of viral mutation(s) within the areas targeted by this assay, and inadequate number of viral copies (<250 copies / mL). A negative result must be combined with clinical observations, patient history, and epidemiological information.  Fact Sheet for Patients:   RoadLapTop.co.za  Fact Sheet for Healthcare Providers: http://kim-miller.com/  This test is not yet approved or  cleared by the [place] FDA and has been authorized for detection and/or diagnosis of SARS-CoV-2 by FDA under an Emergency Use Authorization (EUA).  This EUA will remain in effect (meaning this test can be used) for the duration of the COVID-19 declaration under Section 564(b)(1) of the Act, 21 U.S.C. section 360bbb-3(b)(1), unless the  authorization is terminated or revoked sooner.  Performed at [facility], [address] [place], [place] [address]   MRSA Next Gen by PCR, Nasal     Status: None   Collection Time: 08/09/23  3:41 AM   Specimen: Nasal Mucosa; Nasal Swab  Result Value Ref Range Status   MRSA by PCR Next Gen NOT DETECTED NOT DETECTED Final    Comment: (NOTE) The GeneXpert MRSA Assay (FDA approved for NASAL specimens only), is one component of a comprehensive MRSA colonization surveillance program. It is not intended to diagnose MRSA infection nor to guide or monitor treatment for MRSA infections. Test performance is not FDA approved in patients less than 59 years old. Performed at [facility], [address] [place], [place] [address]     Antimicrobials: Anti-infectives (From admission, onward)    None      Culture/Microbiology    Component Value Date/Time   SDES PLEURAL 07/18/2023 1108   SDES PLEURAL 07/18/2023 1108   SPECREQUEST NONE 07/18/2023 1108   SPECREQUEST NONE 07/18/2023 1108   CULT  07/18/2023 1108    NO GROWTH 5 DAYS Performed at [facility], [address] [place], [place] [address]    REPTSTATUS 07/23/2023 FINAL 07/18/2023 1108   REPTSTATUS 07/18/2023 FINAL 07/18/2023 1108    Radiology Studies: No results found.   LOS: 5 days   Lanae Boast, MD [facility]  08/13/2023, 10:22 AM

## 2023-08-14 ENCOUNTER — Ambulatory Visit
Admission: RE | Admit: 2023-08-14 | Discharge: 2023-08-14 | Disposition: A | Discharge status: Home / Self Care | Payer: BC Managed Care – PPO | Source: Ambulatory Visit | Attending: Radiation Oncology | Admitting: Radiation Oncology

## 2023-08-14 ENCOUNTER — Ambulatory Visit: Payer: BC Managed Care – PPO

## 2023-08-14 ENCOUNTER — Other Ambulatory Visit: Payer: Self-pay

## 2023-08-14 DIAGNOSIS — R0603 Acute respiratory distress: Secondary | ICD-10-CM | POA: Diagnosis not present

## 2023-08-14 DIAGNOSIS — J9601 Acute respiratory failure with hypoxia: Secondary | ICD-10-CM | POA: Diagnosis not present

## 2023-08-14 DIAGNOSIS — J90 Pleural effusion, not elsewhere classified: Secondary | ICD-10-CM | POA: Diagnosis not present

## 2023-08-14 LAB — RAD ONC ARIA SESSION SUMMARY
Course Elapsed Days: 13
Plan Fractions Treated to Date: 9
Plan Prescribed Dose Per Fraction: 3 Gy
Plan Total Fractions Prescribed: 10
Plan Total Prescribed Dose: 30 Gy
Reference Point Dosage Given to Date: 27 Gy
Reference Point Session Dosage Given: 3 Gy
Session Number: 9

## 2023-08-14 LAB — BASIC METABOLIC PANEL
Anion gap: 15 (ref 5–15)
BUN: 19 mg/dL (ref 6–20)
CO2: 27 mmol/L (ref 22–32)
Calcium: 9.1 mg/dL (ref 8.9–10.3)
Chloride: 89 mmol/L — ABNORMAL LOW (ref 98–111)
Creatinine, Ser: 0.55 mg/dL (ref 0.44–1.00)
GFR, Estimated: >60 mL/min (ref >60)
Glucose, Bld: 110 mg/dL — ABNORMAL HIGH (ref 70–99)
Potassium: 3.5 mmol/L (ref 3.5–5.1)
Sodium: 131 mmol/L — ABNORMAL LOW (ref 135–145)

## 2023-08-14 LAB — CBC
HCT: 38.4 % (ref 36.0–46.0)
Hemoglobin: 12.2 g/dL (ref 12.0–15.0)
MCH: 30.1 pg (ref 26.0–34.0)
MCHC: 31.8 g/dL (ref 30.0–36.0)
MCV: 94.8 fL (ref 80.0–100.0)
Platelets: 287 10*3/uL (ref 150–400)
RBC: 4.05 MIL/uL (ref 3.87–5.11)
RDW: 12.3 % (ref 11.5–15.5)
WBC: 11.7 10*3/uL — ABNORMAL HIGH (ref 4.0–10.5)
nRBC: 0 % (ref 0.0–0.2)

## 2023-08-14 MED ORDER — ORAL CARE MOUTH RINSE: 15.0000 mL | OROMUCOSAL | Status: DC | PRN

## 2023-08-14 MED ORDER — LACTULOSE 10 GM/15ML PO SOLN
20.0000 g | Freq: Two times a day (BID) | ORAL | Status: DC | PRN
  Administered 2023-08-17: 20 g via ORAL
  Filled 2023-08-14: qty 30

## 2023-08-14 MED ORDER — SENNOSIDES-DOCUSATE SODIUM 8.6-50 MG PO TABS
1.0000 | ORAL_TABLET | Freq: Every day | ORAL | Status: DC
  Administered 2023-08-14 – 2023-08-18 (×5): 1 via ORAL
  Filled 2023-08-14 (×6): qty 1

## 2023-08-14 MED ORDER — ORAL CARE MOUTH RINSE
15.0000 mL | OROMUCOSAL | Status: DC
  Administered 2023-08-14 – 2023-08-15 (×3): 15 mL via OROMUCOSAL

## 2023-08-14 MED ORDER — HYDROCODONE BIT-HOMATROP MBR 5-1.5 MG/5ML PO SOLN
5.0000 mL | Freq: Four times a day (QID) | ORAL | Status: DC | PRN
  Administered 2023-08-14 – 2023-08-18 (×2): 5 mL via ORAL
  Filled 2023-08-14 (×2): qty 5

## 2023-08-14 NOTE — Progress Notes (Signed)
NAME:  Sherri Schultz, MRN:  [id number], DOB:  [birth year], LOS: 6 ADMISSION DATE:  08/19/2023, CONSULTATION DATE:  08/14/23 REFERRING MD:  Caleb Popp - TRH, CHIEF COMPLAINT:  DOE   History of Present Illness:  57 year old woman with history malignant pleural effusion, chronic hypoxemia on 3 L nasal cannula at home presents with worsening dyspnea and hypoxemia. PMHx significant for HTN, OSA, L breast CA (DCIS), GERD, anxiety.  Contacted pulmonary office.  Has been trying to set up outpatient Pleurx.  Worsening symptoms but unable to get Pleurx done in short notice.  Advised to go to ED.  Chest x-ray showed loculated left pleural effusion on my review interpretation.  As well as scattered multifocal infiltrates consistent with parenchymal lung disease, cancerous signs on my interpretation.  Underwent thoracentesis with IR.  Improvement in hypoxemia.  From nonrebreather down to 4 L at time of evaluation.  Sats high 90s.  Reports improved dyspnea as well.  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   Cancer (HCC) 2013   right knee   Dyspnea    Family history of brain cancer    Family history of breast cancer    Family history of colon cancer    Family history of melanoma    GERD (gastroesophageal reflux disease)    Headache    Hypertension    Personal history of radiation therapy    Sleep apnea    Trimalleolar fracture of ankle, closed, left, initial encounter    Significant Hospital Events: Including procedures, antibiotic start and stop dates in addition to other pertinent events   9/13 Admitted to the hospital with worsening dyspnea on exertion and hypoxemia with recurrent left pleural effusion, improvement in symptoms and hypoxemia after thoracentesis 15-Aug-2023 Stable O2 requirements, 10L [place]. 9/17 Radiation today, tolerated well. O2 requirements stable.  Interim History / Subjective:  NAEON.  Feels about the same. On 15L O2 but dropped to 12 this AM.  She is scheduled for radiation today. She is keen to get moving and try to get home, I explained barrier to this is her degree of O2 requirements and that serial [place] thus far have shown minimal pleural fluid.  Objective:   Blood pressure 119/67, pulse 87, temperature 97.9 F (36.6 C), temperature source Oral, resp. rate (!) 21, height 5' 4.5" (1.638 m), weight 72.2 kg, last menstrual period 03/27/2011, SpO2 96%.        Intake/Output Summary (Last 24 hours) at 08/14/2023 0927 Last data filed at 08/14/2023 0800 Gross per 24 hour  Intake 720 ml  Output 850 ml  Net -130 ml   Filed Weights   07/31/2023 1245 08/09/23 0340 08/10/23 1930  Weight: 78.4 kg 73.1 kg 72.2 kg   Physical Examination: General: Adult female, chronically ill appearing, sitting up in chair, in NAD. Neuro: A&O x 3, no deficits. HEENT: [place]/AT. Sclerae anicteric. EOMI. Cardiovascular: RRR, no M/R/G.  Lungs: Respirations even and unlabored.  CTA bilaterally, No W/R/R. Abdomen: BS x 4, soft, NT/ND.  Musculoskeletal: No gross deformities, no edema.  Skin: Intact, warm, no rashes.   Assessment & Plan:   Recurrent left-sided effusion, malignant Cyto 9/9 IHC stains demonstrating tumor cells positive for CK7, focal PAX8, and focal GATA3 and negative for CDX2 and CK20, indicating possible breast/mullerian origin. S/p IR thoracentesis 9/13 with of amber fluid removed. - Serial bedside ultrasounds thus far have shown minimal  pleural fluid, not enough to warrant PleurX placement. - She has no new symptoms and feels about the same; therefore, likelihood of sudden reaccumulation is low. Would not plan for daily [place] unless this changes.  - Diuresis as able though won't change primary reason for fluid accumulation (malignancy)  Acute-on-chronic hypoxemic respiratory failure Due to likely compressive atelectasis from effusion and shunt with inability to compensate given  disease parenchyma. - Continue supplemental O2 support, goal sat >88% - Needs to ambulate, PT consult placed - Continue diuresis as able - Pulmonary hygiene  Rest per primary team.   Rutherford Guys, PA - [facility] For pager details, please see AMION or use Epic chat  After 1900, please call ELINK for cross coverage needs 08/14/2023, 9:27 AM

## 2023-08-14 NOTE — Progress Notes (Addendum)
PROGRESS NOTE Sherri Schultz  WUJ:[id number] DOB: [birth year] DOA: 08/21/2023 PCP: Elizabeth Palau, FNP  Brief Narrative/Hospital Course: Sherri Schultz is a 57 y.o. female with a history of OSA,breast cancer, migraine headaches, right leg melanoma, anxiety, hypertension, malignant pleural effusion presented secondary to worsening shortness of breath and found to have recurrent left sided pleural effusion requiring thoracentesis on admission on 9/13 and admitted to stepdown unit. Some improvement immediately after thoracentesis with worsening dyspnea likely related to re-accumulation.  Pulmonary critical care following.  Patient with ongoing oxygen need, had worsening oxygen requirement 9/17 night 9/18 AM steroid trial started, remains on diuretics and on serial ultrasound to assess for appropriate pocket for Pleurx catheter.  Subjective: Patient seen and examined Resting comfortably on the bedside chair. Complains pain on the posterior chest with coughing spells-Hycodan added Overnight patient has been afebrile, BP 190s still needing HFNC currently at 12 L down from 15 L overnight  Assessment and Plan: Principal Problem:   Acute respiratory distress Active Problems:   Acute respiratory failure with hypoxia (HCC)   Malignant pleural effusion   Acute on chronic respiratory failure with hypoxia Recurrent malignant left-sided pleural effusion-positive cytology 9/9 possible breast/mllerian origin: Hypoxia in the setting of pleural effusion and compressive atelectasis. US guided thoracentesis on 9/13 w/  710 cc removal and patient felt better initially> unable to place Pleurx catheter as not enough fluid> again needing HFNC since 9/17 night , PCCM following closely with daily w/ bedside US to look for pocket for pleurex catheter.  Respiratory status slightly better on HFNC at 12 L, add Hycodan antitussives cont  diuretics, nebulizer pulmonary hygiene PT OT. Started on  solumedrol trial and seems responding.  Metastatic breast cancer with lung and pleural mets Bilateral shoulder and neck pain and cervical spine metastasis: S/p radiation therapy 9/17, going for radiation therapy 9/19 and overall pain is improving with radiation. Continue pain control with oral opiates, muscle relaxants.  Mild hyponatremia: stable, monitor Anemia of chronic disease likely from malignancy:hemoglobin overall stable, monitor Leukocytosis-resolved GERD-continue PPI Anxiety disorder: Continue Effexor,inderal. Tachycardia, chronic:likely in the setting of malignancy, deconditioning. Cont inderal NFA:OZHYQMV reports she was supposed to get her CPAP machine after her diagnosis back in June but has not been approved and PCCM following up Constipation: last BM charted 9/11- add lactulose prn, stool softeners scheduled  DVT prophylaxis: heparin injection 5,000 Units Start: 08/03/2023 2200 Code Status:   Code Status: Full Code Family Communication: plan of care discussed with patient at bedside. Patient status is: Inpatient because of respiratory failure Level of care: Stepdown   Dispo: The patient is from: Home with family            Anticipated disposition: TBD pending improvement in respiratory failure  Objective: Vitals last 24 hrs: Vitals:   08/14/23 0400 08/14/23 0500 08/14/23 0802 08/14/23 0822  BP: (!) 98/52 110/71 119/67   Pulse: 86 89 87   Resp: (!) 26 (!) 23 (!) 21   Temp: 98.1 F (36.7 C)  97.9 F (36.6 C)   TempSrc: Oral  Oral   SpO2: 94% 94% 96% 96%  Weight:      Height:       Weight change:   Physical Examination: General exam: alert awake, oriented x3  HEENT:Oral mucosa moist, Ear/Nose WNL grossly Respiratory system: Bilaterally diminishedclear BS, no use of accessory muscle Cardiovascular system: S1 & S2 +, No JVD. Gastrointestinal system:Abdomen soft,NT,ND, BS+. Nervous System:Alert, awake, moving all extremities,and following  commands. Extremities:LE edema  neg,distal peripheral pulses palpable and warm.  Skin:No rashes,no icterus. UEA:VWUJWJ muscle bulk,tone, power.   Medications reviewed:  Scheduled Meds:  Chlorhexidine Gluconate Cloth  6 each Topical Q0600   furosemide  40 mg Intravenous Daily   heparin injection (subcutaneous)  5,000 Units Subcutaneous Q8H   influenza vac split trivalent PF  0.5 mL Intramuscular Tomorrow-1000   ipratropium-albuterol  3 mL Nebulization BID   lidocaine  30 mL Intradermal Once   methylPREDNISolone (SOLU-MEDROL) injection  40 mg Intravenous Daily   pantoprazole  40 mg Oral Daily   propranolol ER  80 mg Oral Daily   silver sulfADIAZINE   Topical Daily   venlafaxine XR  150 mg Oral Daily   Continuous Infusions:  sodium chloride Stopped (08/17/2023 1914)    Diet Order             Diet regular Room service appropriate? Yes; Fluid consistency: Thin  Diet effective now                  Intake/Output Summary (Last 24 hours) at 08/14/2023 1001 Last data filed at 08/14/2023 0800 Gross per 24 hour  Intake 720 ml  Output 850 ml  Net -130 ml   Net IO Since Admission: -2,890.39 mL [08/14/23 1001]  Wt Readings from Last 3 Encounters:  08/10/23 72.2 kg  08/04/23 74.8 kg  07/31/23 73.9 kg     Unresulted Labs (From admission, onward)     Start     Ordered   08/15/23 0500  Creatinine, serum  (enoxaparin (LOVENOX)    CrCl >/= 30 ml/min)  Weekly,   R     Comments: while on enoxaparin therapy    08-31-2023 1637   08/14/23 0500  Basic metabolic panel  Daily,   R     Question:  Specimen collection method  Answer:  Lab=Lab collect   08/13/23 0815   08/14/23 0500  CBC  Daily,   R     Question:  Specimen collection method  Answer:  Lab=Lab collect   08/13/23 0815          Data Reviewed: I have personally reviewed following labs and imaging studies CBC: Recent Labs  Lab 08-31-2023 1337 08/09/23 0705 08/12/23 0318 08/14/23 0326  WBC 13.0* 10.6* 10.0 11.7*  NEUTROABS  10.4*  --   --   --   HGB 12.8 11.6* 11.1* 12.2  HCT 39.8 37.7 35.4* 38.4  MCV 94.3 96.2 94.7 94.8  PLT PLATELET CLUMPS NOTED ON SMEAR, UNABLE TO ESTIMATE 290 244 287   Basic Metabolic Panel: Recent Labs  Lab 08/09/23 0705 08/10/23 0853 08/05/2023 0325 08/12/23 0318 08/14/23 0326  NA 134* 132* 135 132* 131*  K 3.4* 3.5 3.4* 3.9 3.5  CL 96* 95* 94* 94* 89*  CO2 25 28 29 24 27   GLUCOSE 98 111* 105* 100* 110*  BUN 8 9 9 11 19   CREATININE 0.56 0.64 0.57 0.65 0.55  CALCIUM 8.7* 8.7* 8.8* 8.6* 9.1  MG  --   --   --  1.9  --   PHOS  --   --   --  3.6  --   GFR: Estimated Creatinine Clearance: 77.4 mL/min (by C-G formula based on SCr of 0.55 mg/dL). Recent Labs  Lab 2023-08-31 1355  LATICACIDVEN 1.6   Recent Results (from the past 240 hour(s))  SARS Coronavirus 2 by RT PCR (hospital order, performed in [facility] hospital lab) *cepheid single result test* Anterior Nasal Swab     Status:  None   Collection Time: 08/12/2023  1:48 PM   Specimen: Anterior Nasal Swab  Result Value Ref Range Status   SARS Coronavirus 2 by RT PCR NEGATIVE NEGATIVE Final    Comment: (NOTE) SARS-CoV-2 target nucleic acids are NOT DETECTED.  The SARS-CoV-2 RNA is generally detectable in upper and lower respiratory specimens during the acute phase of infection. The lowest concentration of SARS-CoV-2 viral copies this assay can detect is 250 copies / mL. A negative result does not preclude SARS-CoV-2 infection and should not be used as the sole basis for treatment or other patient management decisions.  A negative result may occur with improper specimen collection / handling, submission of specimen other than nasopharyngeal swab, presence of viral mutation(s) within the areas targeted by this assay, and inadequate number of viral copies (<250 copies / mL). A negative result must be combined with clinical observations, patient history, and epidemiological information.  Fact Sheet for Patients:    RoadLapTop.co.za  Fact Sheet for Healthcare Providers: http://kim-miller.com/  This test is not yet approved or  cleared by the [place] FDA and has been authorized for detection and/or diagnosis of SARS-CoV-2 by FDA under an Emergency Use Authorization (EUA).  This EUA will remain in effect (meaning this test can be used) for the duration of the COVID-19 declaration under Section 564(b)(1) of the Act, 21 U.S.C. section 360bbb-3(b)(1), unless the authorization is terminated or revoked sooner.  Performed at [facility], [address], [place], [place] [address]   MRSA Next Gen by PCR, Nasal     Status: None   Collection Time: 08/09/23  3:41 AM   Specimen: Nasal Mucosa; Nasal Swab  Result Value Ref Range Status   MRSA by PCR Next Gen NOT DETECTED NOT DETECTED Final    Comment: (NOTE) The GeneXpert MRSA Assay (FDA approved for NASAL specimens only), is one component of a comprehensive MRSA colonization surveillance program. It is not intended to diagnose MRSA infection nor to guide or monitor treatment for MRSA infections. Test performance is not FDA approved in patients less than 74 years old. Performed at [facility], [address] [place], [place] [address]   Antimicrobials: Anti-infectives (From admission, onward)    None     Culture/Microbiology    Component Value Date/Time   SDES PLEURAL 07/18/2023 1108   SDES PLEURAL 07/18/2023 1108   SPECREQUEST NONE 07/18/2023 1108   SPECREQUEST NONE 07/18/2023 1108   CULT  07/18/2023 1108    NO GROWTH 5 DAYS Performed at [facility], [address], [place], [place] [address]    REPTSTATUS 07/23/2023 FINAL 07/18/2023 1108   REPTSTATUS 07/18/2023 FINAL 07/18/2023 1108   Radiology Studies: [facility] Chest Port 1 View  Result Date: 08/13/2023 CLINICAL DATA:  Respiratory failure. EXAM: PORTABLE CHEST 1 VIEW COMPARISON:  08/02/2023.   Chest CT dated 07/29/2023. FINDINGS: Poor inspiration. Stable normal sized heart. Patchy airspace opacities throughout both lungs with mild improvement on the right and no significant change on the left. Stable laterally loculated pleural fluid on the left. Mild dextroconvex thoracic scoliosis. IMPRESSION: 1. Bilateral pneumonia with mild improvement on the right and no significant change on the left. 2. Stable laterally loculated pleural fluid on the left. 3. Poor inspiration. Electronically Signed   By: Beckie Salts M.D.   On: 08/13/2023 11:19     LOS: 6 days   Lanae Boast, MD [facility]  08/14/2023, 10:01 AM

## 2023-08-14 NOTE — Progress Notes (Signed)
   08/14/23 2148  BiPAP/CPAP/SIPAP  BiPAP/CPAP/SIPAP Pt Type Adult  Reason BIPAP/CPAP not in use Non-compliant

## 2023-08-15 ENCOUNTER — Other Ambulatory Visit: Payer: Self-pay

## 2023-08-15 ENCOUNTER — Ambulatory Visit
Admission: RE | Admit: 2023-08-15 | Discharge: 2023-08-15 | Disposition: A | Discharge status: Home / Self Care | Payer: BC Managed Care – PPO | Source: Ambulatory Visit | Attending: Radiation Oncology | Admitting: Radiation Oncology

## 2023-08-15 ENCOUNTER — Ambulatory Visit
Admission: RE | Admit: 2023-08-15 | Discharge: 2023-08-15 | Disposition: A | Discharge status: Home / Self Care | Payer: BC Managed Care – PPO | Source: Ambulatory Visit | Attending: Radiation Oncology

## 2023-08-15 DIAGNOSIS — R0603 Acute respiratory distress: Secondary | ICD-10-CM | POA: Diagnosis not present

## 2023-08-15 DIAGNOSIS — J90 Pleural effusion, not elsewhere classified: Secondary | ICD-10-CM | POA: Diagnosis not present

## 2023-08-15 LAB — CBC
HCT: 35.8 % — ABNORMAL LOW (ref 36.0–46.0)
Hemoglobin: 11.5 g/dL — ABNORMAL LOW (ref 12.0–15.0)
MCH: 29.7 pg (ref 26.0–34.0)
MCHC: 32.1 g/dL (ref 30.0–36.0)
MCV: 92.5 fL (ref 80.0–100.0)
Platelets: 281 10*3/uL (ref 150–400)
RBC: 3.87 MIL/uL (ref 3.87–5.11)
RDW: 12.4 % (ref 11.5–15.5)
WBC: 13.3 10*3/uL — ABNORMAL HIGH (ref 4.0–10.5)
nRBC: 0 % (ref 0.0–0.2)

## 2023-08-15 LAB — BASIC METABOLIC PANEL
Anion gap: 16 — ABNORMAL HIGH (ref 5–15)
BUN: 23 mg/dL — ABNORMAL HIGH (ref 6–20)
CO2: 27 mmol/L (ref 22–32)
Calcium: 8.8 mg/dL — ABNORMAL LOW (ref 8.9–10.3)
Chloride: 87 mmol/L — ABNORMAL LOW (ref 98–111)
Creatinine, Ser: 0.72 mg/dL (ref 0.44–1.00)
GFR, Estimated: >60 mL/min (ref >60)
Glucose, Bld: 103 mg/dL — ABNORMAL HIGH (ref 70–99)
Potassium: 3.3 mmol/L — ABNORMAL LOW (ref 3.5–5.1)
Sodium: 130 mmol/L — ABNORMAL LOW (ref 135–145)

## 2023-08-15 LAB — RAD ONC ARIA SESSION SUMMARY
Course Elapsed Days: 14
Plan Fractions Treated to Date: 10
Plan Prescribed Dose Per Fraction: 3 Gy
Plan Total Fractions Prescribed: 10
Plan Total Prescribed Dose: 30 Gy
Reference Point Dosage Given to Date: 30 Gy
Reference Point Session Dosage Given: 3 Gy
Session Number: 10

## 2023-08-15 MED ORDER — POTASSIUM CHLORIDE CRYS ER 20 MEQ PO TBCR
40.0000 meq | EXTENDED_RELEASE_TABLET | Freq: Once | ORAL | Status: AC
  Administered 2023-08-15: 40 meq via ORAL
  Filled 2023-08-15: qty 2

## 2023-08-15 NOTE — Progress Notes (Signed)
NAME:  Sherri Schultz, MRN:  [id number], DOB:  1966/03/03, LOS: 7 ADMISSION DATE:  08/10/2023, CONSULTATION DATE:  08/15/23 REFERRING MD:  Caleb Popp - TRH, CHIEF COMPLAINT:  DOE   History of Present Illness:  57 year old woman with history malignant pleural effusion, chronic hypoxemia on 3 L nasal cannula at home presents with worsening dyspnea and hypoxemia. PMHx significant for HTN, OSA, L breast CA (DCIS), GERD, anxiety.  Contacted pulmonary office.  Has been trying to set up outpatient Pleurx.  Worsening symptoms but unable to get Pleurx done in short notice.  Advised to go to ED.  Chest x-ray showed loculated left pleural effusion on my review interpretation.  As well as scattered multifocal infiltrates consistent with parenchymal lung disease, cancerous signs on my interpretation.  Underwent thoracentesis with IR.  Improvement in hypoxemia.  From nonrebreather down to 4 L at time of evaluation.  Sats high 90s.  Reports improved dyspnea as well.  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   Cancer (HCC) 2013   right knee   Dyspnea    Family history of brain cancer    Family history of breast cancer    Family history of colon cancer    Family history of melanoma    GERD (gastroesophageal reflux disease)    Headache    Hypertension    Personal history of radiation therapy    Sleep apnea    Trimalleolar fracture of ankle, closed, left, initial encounter    Significant Hospital Events: Including procedures, antibiotic start and stop dates in addition to other pertinent events   9/13 Admitted to the hospital with worsening dyspnea on exertion and hypoxemia with recurrent left pleural effusion, improvement in symptoms and hypoxemia after thoracentesis 08/17/2023 Stable O2 requirements, 10L [place]. 9/17 Radiation today, tolerated well. O2 requirements stable.  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 rate (!) 27, height 5' 4.5" (1.638 m), weight 72.2 kg, last menstrual period 03/27/2011, SpO2 94%.        Intake/Output Summary (Last 24 hours) at 08/15/2023 4098 Last data filed at 08/14/2023 2000 Gross per 24 hour  Intake 840 ml  Output 400 ml  Net 440 ml   Filed Weights   07/30/2023 1245 08/09/23 0340 08/10/23 1930  Weight: 78.4 kg 73.1 kg 72.2 kg   Physical Examination: General: Chronically ill appearing sitting up to chair on 10 L/min in no distress Neuro: Awake, alert, appropriate, follows commands, no focal HEENT: Oropharynx clear, no icterus, strong voice, no stridor Cardiovascular: Distant, regular, no murmur Lungs: Comfortable respiratory pattern, coarse on the left, mostly clear on the right Abdomen: Nondistended with positive bowel sounds Musculoskeletal: No edema Skin: No rash   Assessment & Plan:  Acute on chronic hypoxemic respiratory failure Recurrent left-sided effusion, malignant, breast/mllerian.  Apparent breast cancer in absence of any pelvic abnormality on CT abdomen Cyto 9/9 IHC stains demonstrating tumor cells positive for CK7, focal PAX8, and focal GATA3 and negative for CDX2 and CK20, indicating possible breast/mullerian origin. S/p IR thoracentesis 9/13 with of amber fluid removed. -She still may ultimately benefit from a Pleurx catheter placement although pleural fluid reaccumulation has not been quick based on serial bedside US.  Will continue to follow.  She is  willing to have Pleurx placed if we feel that it would benefit her -Continue XRT to her spinal mets -Suspect that her left-sided parenchymal disease burden is a significant contributor to her hypoxemia in addition to the pleural effusion.  Will involve oncology today 9/20, determine whether there are any options for treatment here.   Anticipate systemic treatment would help with the pleural effusion reaccumulation as well -Wean FiO2 -Diuresis if she can tolerate -Push PT/OT, ambulation   Levy Pupa, MD, PhD 08/15/2023, 9:39 AM [facility] [phone] or if no answer before 7:00PM call [phone] For any issues after 7:00PM please call eLink [phone]

## 2023-08-15 NOTE — Progress Notes (Signed)
Sherri Schultz   DOB:05-19-1966   XB#:[id number]   ZHY#:[id number]  Subjective: Patient is doing ok she says. SOB is reasonably well controlled on oxygen. She says the team recommended to wait on pleurex for now. She is otherwise comfortable and is hoping to go home.  Objective:  Vitals:   08/15/23 1930 08/15/23 2030  BP:    Pulse:  88  Resp:  (!) 23  Temp: 98.2 F (36.8 C)   SpO2:  93%    Body mass index is 26.9 kg/m.  Intake/Output Summary (Last 24 hours) at 08/15/2023 2059 Last data filed at 08/15/2023 1200 Gross per 24 hour  Intake --  Output 400 ml  Net -400 ml     Resting, on oxygen, able to speak full sentences             No other distress  CBG (last 3)  No results for input(s): "GLUCAP" in the last 72 hours.   Labs:  Lab Results  Component Value Date   WBC 13.3 (H) 08/15/2023   HGB 11.5 (L) 08/15/2023   HCT 35.8 (L) 08/15/2023   MCV 92.5 08/15/2023   PLT 281 08/15/2023   NEUTROABS 10.4 (H) 09/06/23    @LASTCHEMISTRY @  Urine Studies No results for input(s): "UHGB", "CRYS" in the last 72 hours.  Invalid input(s): "UACOL", "UAPR", "USPG", "UPH", "UTP", "UGL", "UKET", "UBIL", "UNIT", "UROB", "ULEU", "UEPI", "UWBC", "URBC", "UBAC", "CAST", "UCOM", "BILUA"  Basic Metabolic Panel: Recent Labs  Lab 08/10/23 0853 08/22/2023 0325 08/12/23 0318 08/14/23 0326 08/15/23 0255  NA 132* 135 132* 131* 130*  K 3.5 3.4* 3.9 3.5 3.3*  CL 95* 94* 94* 89* 87*  CO2 28 29 24 27 27   GLUCOSE 111* 105* 100* 110* 103*  BUN 9 9 11 19  23*  CREATININE 0.64 0.57 0.65 0.55 0.72  CALCIUM 8.7* 8.8* 8.6* 9.1 8.8*  MG  --   --  1.9  --   --   PHOS  --   --  3.6  --   --    GFR Estimated Creatinine Clearance: 77.4 mL/min (by C-G formula based on SCr of 0.72 mg/dL). Liver Function Tests: No results for input(s): "AST", "ALT", "ALKPHOS", "BILITOT", "PROT", "ALBUMIN" in the last 168 hours. No results for input(s): "LIPASE", "AMYLASE" in the last 168 hours. No results  for input(s): "AMMONIA" in the last 168 hours. Coagulation profile No results for input(s): "INR", "PROTIME" in the last 168 hours.  CBC: Recent Labs  Lab 08/09/23 0705 08/12/23 0318 08/14/23 0326 08/15/23 0255  WBC 10.6* 10.0 11.7* 13.3*  HGB 11.6* 11.1* 12.2 11.5*  HCT 37.7 35.4* 38.4 35.8*  MCV 96.2 94.7 94.8 92.5  PLT 290 244 287 281   Cardiac Enzymes: No results for input(s): "CKTOTAL", "CKMB", "CKMBINDEX", "TROPONINI" in the last 168 hours. BNP: Invalid input(s): "POCBNP" CBG: No results for input(s): "GLUCAP" in the last 168 hours. D-Dimer No results for input(s): "DDIMER" in the last 72 hours. Hgb A1c No results for input(s): "HGBA1C" in the last 72 hours. Lipid Profile No results for input(s): "CHOL", "HDL", "LDLCALC", "TRIG", "CHOLHDL", "LDLDIRECT" in the last 72 hours. Thyroid function studies No results for input(s): "TSH", "T4TOTAL", "T3FREE", "THYROIDAB" in the last 72 hours.  Invalid input(s): "FREET3" Anemia work up No results for input(s): "VITAMINB12", "FOLATE", "FERRITIN", "TIBC", "IRON", "RETICCTPCT" in the last 72 hours. Microbiology Recent Results (from the past 240 hour(s))  SARS Coronavirus 2 by RT PCR (hospital order, performed in [facility] hospital lab) *cepheid single  result test* Anterior Nasal Swab     Status: None   Collection Time: 08/15/23  1:48 PM   Specimen: Anterior Nasal Swab  Result Value Ref Range Status   SARS Coronavirus 2 by RT PCR NEGATIVE NEGATIVE Final    Comment: (NOTE) SARS-CoV-2 target nucleic acids are NOT DETECTED.  The SARS-CoV-2 RNA is generally detectable in upper and lower respiratory specimens during the acute phase of infection. The lowest concentration of SARS-CoV-2 viral copies this assay can detect is 250 copies / mL. A negative result does not preclude SARS-CoV-2 infection and should not be used as the sole basis for treatment or other patient management decisions.  A negative result may occur  with improper specimen collection / handling, submission of specimen other than nasopharyngeal swab, presence of viral mutation(s) within the areas targeted by this assay, and inadequate number of viral copies (<250 copies / mL). A negative result must be combined with clinical observations, patient history, and epidemiological information.  Fact Sheet for Patients:   RoadLapTop.co.za  Fact Sheet for Healthcare Providers: http://kim-miller.com/  This test is not yet approved or  cleared by the [place] FDA and has been authorized for detection and/or diagnosis of SARS-CoV-2 by FDA under an Emergency Use Authorization (EUA).  This EUA will remain in effect (meaning this test can be used) for the duration of the COVID-19 declaration under Section 564(b)(1) of the Act, 21 U.S.C. section 360bbb-3(b)(1), unless the authorization is terminated or revoked sooner.  Performed at [facility], [address], [place], [place] [address]   MRSA Next Gen by PCR, Nasal     Status: None   Collection Time: 08/09/23  3:41 AM   Specimen: Nasal Mucosa; Nasal Swab  Result Value Ref Range Status   MRSA by PCR Next Gen NOT DETECTED NOT DETECTED Final    Comment: (NOTE) The GeneXpert MRSA Assay (FDA approved for NASAL specimens only), is one component of a comprehensive MRSA colonization surveillance program. It is not intended to diagnose MRSA infection nor to guide or monitor treatment for MRSA infections. Test performance is not FDA approved in patients less than 48 years old. Performed at [facility], [address] [place], [place] [address]    Reviewed pathology report.   FINAL MICROSCOPIC DIAGNOSIS:  A. LYMPH NODE, STATION 4R, FINE NEEDLE ASPIRATION:  - Malignant cells present   B. LYMPH NODE, STATION 7, FINE NEEDLE ASPIRATION:  - Malignant cells present. See Comment.   Additional  immunohistochemical stains reveal tumor cells are positive  for CK7, focal PAX8, and focal GATA3 and negative for CDX2 and CK20,  indicating possible breast/mullerian origin.  Given the patient's  history of breast carcinoma, a breast origin is more likely.  Clinical  and imaging correlation is recommended for further assessment.   ER neg, PR neg, Her 2 2+ by IHC, FISH pending  Radiology CT angio  IMPRESSION: 1. No evidence of significant pulmonary embolus. 2. Persistent finding of mediastinal lymphadenopathy, unchanged. 3. Large left and small right pleural effusions are progressing since prior CT. 4. Diffuse interstitial and alveolar infiltrates throughout both lungs is also progressing. Changes may represent edema or multifocal pneumonia. Aspiration or underlying neoplasm could also be considered.   CT abdomen/pelvis IMPRESSION: 1. No acute intra-abdominal or intrapelvic abnormality. No intra-abdominal or intrapelvic metastatic findings. 2. Irregular interstitial and patchy airspace opacities of visualized lower lungs. Persistent moderate volume partially visualized left pleural effusion with question associate pleural thickening. Trace right pleural effusion.  Findings better evaluated on CT angiography chest 07/29/2023. 3. Persistent indeterminate T9 mixed lytic and sclerotic osseous.  Assessment: 57 y.o.  ASSESSMENT: 57 y.o. 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  The Multi-Cancer Panel offered by [facility] includes also found no deleterious mutations inAIP, ALK, APC, ATM, AXIN2,BAP1,  BARD1, BLM, BMPR1A, BRCA1, BRCA2, BRIP1, CASR, CDC73, CDH1, CDK4, CDKN1B, CDKN1C, CDKN2A  (p14ARF), CDKN2A (p16INK4a), CEBPA, CHEK2, CTNNA1, DICER1, DIS3L2, EGFR (c.2369C>T, p.Thr790Met variant only), EPCAM (Deletion/duplication testing only), FH, FLCN, GATA2, GPC3, GREM1 (Promoter region deletion/duplication testing only), HOXB13 (c.251G>A, p.Gly84Glu), HRAS, KIT, MAX, MEN1, MET, MITF (c.952G>A, p.Glu318Lys variant only), MLH1, MSH2, MSH3, MSH6, MUTYH, NBN, NF1, NF2, NTHL1, PALB2, PDGFRA, PHOX2B, PMS2, POLD1, POLE, POT1, PRKAR1A, PTCH1, PTEN, RAD50, RAD51C, RAD51D, RB1, RECQL4, RET, RNF43, RUNX1, SDHAF2, SDHA (sequence changes only), SDHB, SDHC, SDHD, SMAD4, SMARCA4, SMARCB1, SMARCE1, STK11, SUFU, TERC, TERT, TMEM127, TP53, TSC1, TSC2, VHL, WRN and WT1.                (a) a variant of  uncertain significance was detected in the MSH3 gene called c.230_232dup (p.Pro77dup).    (2) status post bilateral lumpectomies 02/15/2020, showing             (a) on the right, no evidence of malignancy (radial scar, fibroadenoma).             (b) on the left, ductal carcinoma in situ, grade 3, measuring 1.4 cm, with negative margins   (3) adjuvant radiation 03/20/2020 - 05/04/2020             (a) left breast / 50.4 Gy in 28 fractions             (b) seroma boost / 10 Gy in 5 fractions   (4) started tamoxifen 05/29/2020  (5) She is now diagnosed with metastatic disease to left pleural effusion, pleural thickening, wide spread bone mets, s/p biopsy to the 4 R LN and lymph node station 7, final path showing malignant cells consistent with breast primary, ER, PR neg, Her 2 2+, FISH pending.  PLAN  She continues to be SOB. Consider pleurex catheter placement if she cannot successfully be weaned off of oxygen to be able to be discharged. If she is her 2 non amplified, I will plan outpatient first line xeloda for treatment. She can receive 2nd line enhertu or sacituzumab. Provided her PS permits, she can try several lines of chemotherapy. I have discussed with Dr Albion Callas from pathology, Dr Aplington Callas suggested  we first complete prognostics, then determine the residual sample to be sent for foundation one testing which will also help guide the treatment plan especially for second line onwards. I have discussed the above mentioned with the patient. She is satisfied and would like to try chemotherapy as recommended She is already scheduled to see [place] in clinic.   Rachel Moulds, MD 08/15/2023  8:59 PM

## 2023-08-15 NOTE — Progress Notes (Signed)
PROGRESS NOTE Sherri Schultz  ZOX:[id number] DOB: 26-Aug-1966 DOA: 08/09/2023 PCP: Elizabeth Palau, FNP  Brief Narrative/Hospital Course: Sherri Schultz is a 57 y.o. female with a history of OSA,breast cancer, migraine headaches, right leg melanoma, anxiety, hypertension, malignant pleural effusion presented secondary to worsening shortness of breath and found to have recurrent left sided pleural effusion requiring thoracentesis on admission on 9/13 and admitted to stepdown unit. Some improvement immediately after thoracentesis with worsening dyspnea likely related to re-accumulation.  Pulmonary critical care following.  Patient with ongoing oxygen need, had worsening oxygen requirement 9/17 night 9/18 AM steroid trial started, remains on diuretics and on serial ultrasound to assess for appropriate pocket for Pleurx catheter.  Subjective: Seen and examined She is resting comfortably Down to 10 L high flow nasal cannula. Complains of mild cough. Due for 1 more round of radiation today Labs with hyponatremia hypokalemia  Assessment and Plan: Principal Problem:   Acute respiratory distress Active Problems:   Acute respiratory failure with hypoxia (HCC)   Malignant pleural effusion   Acute on chronic respiratory failure with hypoxia Recurrent malignant left-sided pleural effusion-positive cytology 9/9 possible breast/mllerian origin: Hypoxia in the setting of pleural effusion and compressive atelectasis AND parenchymal disease. US guided thoracentesis on 9/13 w/  710 cc removal and patient felt better initially> unable to place Pleurx catheter as not enough fluid> again needing HFNC since 9/17 night , PCCM following closely with  serial bedside US to look for pocket for pleurex catheter.  She is down to 10 L today, with mild cough.  Continue Hycodan antitussives, IV Lasix, Solu-Medrol iv ( steroid trail for lung findings) Continue nebulizer pulmonary hygiene. Continue  plan per PCCM  Metastatic breast cancer with lung and pleural mets Bilateral shoulder and neck pain and cervical spine metastasis: S/p radiation therapy 9/17,9/19 and one more today-overall pain is improving continue her pain management muscle relaxant  Mild hyponatremia: stable, monitor Hypokalemia: Replacing with K. Dur  Anemia of chronic disease likely from malignancy:hemoglobin overall stable, monitor Leukocytosis-in the setting of steroid use.  Afebrile.  Monitor  GERD-continue PPI  Anxiety disorder: Continue Effexor,inderal.  Tachycardia, chronic:likely in the setting of malignancy, deconditioning. Cont inderal  WJX:BJYNWGN reports she was supposed to get her CPAP machine after her diagnosis back in June but has not been approved and PCCM following up  Constipation: Chronic normally goes once a week, continue stool softener scheduled and lactulose  prn  DVT prophylaxis: heparin injection 5,000 Units Start: 08/15/2023 2200 Code Status:   Code Status: Full Code Family Communication: plan of care discussed with patient at bedside. Patient status is: Inpatient because of respiratory failure Level of care: Stepdown   Dispo: The patient is from: Home with family            Anticipated disposition: TBD pending improvement in respiratory failure  Objective: Vitals last 24 hrs: Vitals:   08/15/23 0800 08/15/23 0820 08/15/23 0849 08/15/23 0900  BP: 112/61   (!) 100/57  Pulse: 93   91  Resp: (!) 31   (!) 27  Temp:  98.5 F (36.9 C)    TempSrc:  Oral    SpO2: 94%  93% 94%  Weight:      Height:       Weight change:   Physical Examination: General exam: alert awake, oriented and pleasant.  HEENT:Oral mucosa moist, Ear/Nose WNL grossly Respiratory system: Bilaterally crackles at posterior lungs, on 10 l HFNC Cardiovascular system: S1 & S2 +, No JVD.  Gastrointestinal system: Abdomen soft,NT,ND, BS+ Nervous System: Alert, awake, moving all extremities,and following  commands. Extremities: LE edema neg,distal peripheral pulses palpable and warm.  Skin: No rashes,no icterus. MSK: Normal muscle bulk,tone, power   Medications reviewed:  Scheduled Meds:  Chlorhexidine Gluconate Cloth  6 each Topical Q0600   furosemide  40 mg Intravenous Daily   heparin injection (subcutaneous)  5,000 Units Subcutaneous Q8H   influenza vac split trivalent PF  0.5 mL Intramuscular Tomorrow-1000   ipratropium-albuterol  3 mL Nebulization BID   lidocaine  30 mL Intradermal Once   methylPREDNISolone (SOLU-MEDROL) injection  40 mg Intravenous Daily   mouth rinse  15 mL Mouth Rinse 4 times per day   pantoprazole  40 mg Oral Daily   propranolol ER  80 mg Oral Daily   senna-docusate  1 tablet Oral Daily   silver sulfADIAZINE   Topical Daily   venlafaxine XR  150 mg Oral Daily   Continuous Infusions:  sodium chloride Stopped (08/09/2023 1914)    Diet Order             Diet regular Room service appropriate? Yes; Fluid consistency: Thin  Diet effective now                  Intake/Output Summary (Last 24 hours) at 08/15/2023 0917 Last data filed at 08/14/2023 2000 Gross per 24 hour  Intake 840 ml  Output 400 ml  Net 440 ml   Net IO Since Admission: -2,330.39 mL [08/15/23 0917]  Wt Readings from Last 3 Encounters:  08/10/23 72.2 kg  08/04/23 74.8 kg  07/31/23 73.9 kg     Unresulted Labs (From admission, onward)     Start     Ordered   08/15/23 0500  Creatinine, serum  (enoxaparin (LOVENOX)    CrCl >/= 30 ml/min)  Weekly,   R     Comments: while on enoxaparin therapy    09-01-2023 1637   08/14/23 0500  Basic metabolic panel  Daily,   R     Question:  Specimen collection method  Answer:  Lab=Lab collect   08/13/23 0815   08/14/23 0500  CBC  Daily,   R     Question:  Specimen collection method  Answer:  Lab=Lab collect   08/13/23 0815          Data Reviewed: I have personally reviewed following labs and imaging studies CBC: Recent Labs  Lab  09-01-2023 1337 08/09/23 0705 08/12/23 0318 08/14/23 0326 08/15/23 0255  WBC 13.0* 10.6* 10.0 11.7* 13.3*  NEUTROABS 10.4*  --   --   --   --   HGB 12.8 11.6* 11.1* 12.2 11.5*  HCT 39.8 37.7 35.4* 38.4 35.8*  MCV 94.3 96.2 94.7 94.8 92.5  PLT PLATELET CLUMPS NOTED ON SMEAR, UNABLE TO ESTIMATE 290 244 287 281   Basic Metabolic Panel: Recent Labs  Lab 08/10/23 0853 08/10/2023 0325 08/12/23 0318 08/14/23 0326 08/15/23 0255  NA 132* 135 132* 131* 130*  K 3.5 3.4* 3.9 3.5 3.3*  CL 95* 94* 94* 89* 87*  CO2 28 29 24 27 27   GLUCOSE 111* 105* 100* 110* 103*  BUN 9 9 11 19  23*  CREATININE 0.64 0.57 0.65 0.55 0.72  CALCIUM 8.7* 8.8* 8.6* 9.1 8.8*  MG  --   --  1.9  --   --   PHOS  --   --  3.6  --   --   GFR: Estimated Creatinine Clearance: 77.4 mL/min (by C-G formula  based on SCr of 0.72 mg/dL). Recent Labs  Lab 08/07/2023 1355  LATICACIDVEN 1.6   Recent Results (from the past 240 hour(s))  SARS Coronavirus 2 by RT PCR (hospital order, performed in [facility] hospital lab) *cepheid single result test* Anterior Nasal Swab     Status: None   Collection Time: 07/28/2023  1:48 PM   Specimen: Anterior Nasal Swab  Result Value Ref Range Status   SARS Coronavirus 2 by RT PCR NEGATIVE NEGATIVE Final    Comment: (NOTE) SARS-CoV-2 target nucleic acids are NOT DETECTED.  The SARS-CoV-2 RNA is generally detectable in upper and lower respiratory specimens during the acute phase of infection. The lowest concentration of SARS-CoV-2 viral copies this assay can detect is 250 copies / mL. A negative result does not preclude SARS-CoV-2 infection and should not be used as the sole basis for treatment or other patient management decisions.  A negative result may occur with improper specimen collection / handling, submission of specimen other than nasopharyngeal swab, presence of viral mutation(s) within the areas targeted by this assay, and inadequate number of viral copies (<250 copies / mL). A  negative result must be combined with clinical observations, patient history, and epidemiological information.  Fact Sheet for Patients:   RoadLapTop.co.za  Fact Sheet for Healthcare Providers: http://kim-miller.com/  This test is not yet approved or  cleared by the [place] FDA and has been authorized for detection and/or diagnosis of SARS-CoV-2 by FDA under an Emergency Use Authorization (EUA).  This EUA will remain in effect (meaning this test can be used) for the duration of the COVID-19 declaration under Section 564(b)(1) of the Act, 21 U.S.C. section 360bbb-3(b)(1), unless the authorization is terminated or revoked sooner.  Performed at [facility], [address] [place], [place] [address]   MRSA Next Gen by PCR, Nasal     Status: None   Collection Time: 08/09/23  3:41 AM   Specimen: Nasal Mucosa; Nasal Swab  Result Value Ref Range Status   MRSA by PCR Next Gen NOT DETECTED NOT DETECTED Final    Comment: (NOTE) The GeneXpert MRSA Assay (FDA approved for NASAL specimens only), is one component of a comprehensive MRSA colonization surveillance program. It is not intended to diagnose MRSA infection nor to guide or monitor treatment for MRSA infections. Test performance is not FDA approved in patients less than 36 years old. Performed at [facility], [address], [place], [place] [address]   Antimicrobials: Anti-infectives (From admission, onward)    None     Culture/Microbiology    Component Value Date/Time   SDES PLEURAL 07/18/2023 1108   SDES PLEURAL 07/18/2023 1108   SPECREQUEST NONE 07/18/2023 1108   SPECREQUEST NONE 07/18/2023 1108   CULT  07/18/2023 1108    NO GROWTH 5 DAYS Performed at [facility], [address] [place], [place] [address]    REPTSTATUS 07/23/2023 FINAL 07/18/2023 1108   REPTSTATUS 07/18/2023 FINAL 07/18/2023 1108   Radiology  Studies: [facility] Chest Port 1 View  Result Date: 08/13/2023 CLINICAL DATA:  Respiratory failure. EXAM: PORTABLE CHEST 1 VIEW COMPARISON:  09/03/23.  Chest CT dated 07/29/2023. FINDINGS: Poor inspiration. Stable normal sized heart. Patchy airspace opacities throughout both lungs with mild improvement on the right and no significant change on the left. Stable laterally loculated pleural fluid on the left. Mild dextroconvex thoracic scoliosis. IMPRESSION: 1. Bilateral pneumonia with mild improvement on the right and no significant change on the left. 2. Stable  laterally loculated pleural fluid on the left. 3. Poor inspiration. Electronically Signed   By: Beckie Salts M.D.   On: 08/13/2023 11:19     LOS: 7 days   Lanae Boast, MD [facility]  08/15/2023, 9:17 AM

## 2023-08-15 NOTE — Plan of Care (Signed)

## 2023-08-15 NOTE — Evaluation (Signed)
Physical Therapy Evaluation Patient Details Name: Sherri Schultz MRN: [id number] DOB: 01/26/1966 Today's Date: 08/15/2023  History of Present Illness  Pt admitted from home with SOB 2* acute on chronic respiratory failure with hypoxia and recurrent malignant pleural effusion.  PT with hx of Breast CA with mets to lung and C-spine  Clinical Impression  Pt admitted as above and presenting with functional mobility limitations 2* decreased endurance, mild ambulatory balance deficits, and mild generalized weakness.  Pt should progress to dc home with family assist and would benefit from use of RW for home.        If plan is discharge home, recommend the following: A little help with walking and/or transfers;A little help with bathing/dressing/bathroom;Assistance with cooking/housework;Assist for transportation;Help with stairs or ramp for entrance   Can travel by private vehicle        Equipment Recommendations Rolling walker (2 wheels)  Recommendations for Other Services  OT consult    Functional Status Assessment Patient has had a recent decline in their functional status and demonstrates the ability to make significant improvements in function in a reasonable and predictable amount of time.     Precautions / Restrictions Precautions Precautions: Fall Restrictions Weight Bearing Restrictions: No      Mobility  Bed Mobility Overal bed mobility: Modified Independent             General bed mobility comments: unassisted to bed side sitting    Transfers Overall transfer level: Needs assistance Equipment used: None Transfers: Sit to/from Stand Sit to Stand: Supervision, Contact guard assist           General transfer comment: for safety    Ambulation/Gait Ambulation/Gait assistance: Contact guard assist Gait Distance (Feet): 280 Feet Assistive device: None, Rolling walker (2 wheels) Gait Pattern/deviations: Step-through pattern, Decreased step length -  right, Decreased step length - left, Shuffle       General Gait Details: Mild general instability but no over LOB - pt reports similar to mobility at home.  Pt ambulated first 180' sans AD but RW introduced with pt report of increasing fatigue and LE weakness  Stairs            Wheelchair Mobility     Tilt Bed    Modified Rankin (Stroke Patients Only)       Balance Overall balance assessment: Mild deficits observed, not formally tested                                           Pertinent Vitals/Pain Pain Assessment Pain Assessment: No/denies pain    Home Living Family/patient expects to be discharged to:: Private residence Living Arrangements: Spouse/significant other Available Help at Discharge: Family Type of Home: House Home Access: Stairs to enter Entrance Stairs-Rails: Can reach both Entrance Stairs-Number of Steps: 2   Home Layout: One level Home Equipment: None      Prior Function Prior Level of Function : Independent/Modified Independent             Mobility Comments: Pt reports IND with mobility but limited by SOB with activity       Extremity/Trunk Assessment   Upper Extremity Assessment Upper Extremity Assessment: Generalized weakness    Lower Extremity Assessment Lower Extremity Assessment: Generalized weakness    Cervical / Trunk Assessment Cervical / Trunk Assessment: Normal  Communication   Communication Communication: No apparent difficulties  Cognition Arousal: Alert Behavior During Therapy: WFL for tasks assessed/performed Overall Cognitive Status: Within Functional Limits for tasks assessed                                          General Comments      Exercises     Assessment/Plan    PT Assessment Patient needs continued PT services  PT Problem List Decreased strength;Decreased activity tolerance;Decreased balance;Decreased mobility;Decreased knowledge of use of DME       PT  Treatment Interventions DME instruction;Gait training;Stair training;Functional mobility training;Therapeutic activities;Patient/family education    PT Goals (Current goals can be found in the Care Plan section)  Acute Rehab PT Goals Patient Stated Goal: REgain IND and return home PT Goal Formulation: With patient Time For Goal Achievement: 08/28/23 Potential to Achieve Goals: Good    Frequency Min 1X/week     Co-evaluation               AM-PAC PT "6 Clicks" Mobility  Outcome Measure Help needed turning from your back to your side while in a flat bed without using bedrails?: None Help needed moving from lying on your back to sitting on the side of a flat bed without using bedrails?: None Help needed moving to and from a bed to a chair (including a wheelchair)?: A Little Help needed standing up from a chair using your arms (e.g., wheelchair or bedside chair)?: A Little Help needed to walk in hospital room?: A Little Help needed climbing 3-5 steps with a railing? : A Little 6 Click Score: 20    End of Session Equipment Utilized During Treatment: Gait belt;Oxygen Activity Tolerance: Patient tolerated treatment well;Patient limited by fatigue Patient left: in chair;with call bell/phone within reach;with family/visitor present Nurse Communication: Mobility status PT Visit Diagnosis: Difficulty in walking, not elsewhere classified (R26.2);Muscle weakness (generalized) (M62.81)    Time: 6644-0347 PT Time Calculation (min) (ACUTE ONLY): 22 min   Charges:   PT Evaluation $PT Eval Low Complexity: 1 Low   PT General Charges $$ ACUTE PT VISIT: 1 Visit         Mauro Kaufmann PT Acute Rehabilitation Services Pager [phone] Office [phone]   Armya Westerhoff 08/15/2023, 3:32 PM

## 2023-08-15 NOTE — Progress Notes (Signed)
   08/15/23 2030  BiPAP/CPAP/SIPAP  BiPAP/CPAP/SIPAP Pt Type Adult  Reason BIPAP/CPAP not in use Non-compliant  BiPAP/CPAP /SiPAP Vitals  Pulse Rate 88  Resp (!) 23  SpO2 93 %  Bilateral Breath Sounds Clear;Diminished  MEWS Score/Color  MEWS Score 1  MEWS Score Color [place] Si

## 2023-08-16 ENCOUNTER — Inpatient Hospital Stay (HOSPITAL_COMMUNITY): Payer: BC Managed Care – PPO

## 2023-08-16 DIAGNOSIS — J9601 Acute respiratory failure with hypoxia: Secondary | ICD-10-CM | POA: Diagnosis not present

## 2023-08-16 DIAGNOSIS — R0603 Acute respiratory distress: Secondary | ICD-10-CM | POA: Diagnosis not present

## 2023-08-16 LAB — BASIC METABOLIC PANEL
Anion gap: 15 (ref 5–15)
BUN: 27 mg/dL — ABNORMAL HIGH (ref 6–20)
CO2: 32 mmol/L (ref 22–32)
Calcium: 9.3 mg/dL (ref 8.9–10.3)
Chloride: 88 mmol/L — ABNORMAL LOW (ref 98–111)
Creatinine, Ser: 0.83 mg/dL (ref 0.44–1.00)
GFR, Estimated: >60 mL/min (ref >60)
Glucose, Bld: 118 mg/dL — ABNORMAL HIGH (ref 70–99)
Potassium: 3.5 mmol/L (ref 3.5–5.1)
Sodium: 135 mmol/L (ref 135–145)

## 2023-08-16 LAB — CBC
HCT: 39.3 % (ref 36.0–46.0)
Hemoglobin: 12.5 g/dL (ref 12.0–15.0)
MCH: 30.2 pg (ref 26.0–34.0)
MCHC: 31.8 g/dL (ref 30.0–36.0)
MCV: 94.9 fL (ref 80.0–100.0)
Platelets: 291 10*3/uL (ref 150–400)
RBC: 4.14 MIL/uL (ref 3.87–5.11)
RDW: 12.4 % (ref 11.5–15.5)
WBC: 14.3 10*3/uL — ABNORMAL HIGH (ref 4.0–10.5)
nRBC: 0 % (ref 0.0–0.2)

## 2023-08-16 MED ORDER — IOHEXOL 300 MG/ML  SOLN
75.0000 mL | Freq: Once | INTRAMUSCULAR | Status: AC | PRN
  Administered 2023-08-16: 75 mL via INTRAVENOUS

## 2023-08-16 NOTE — Progress Notes (Signed)
PROGRESS NOTE Sherri Schultz  UVO:[id number] DOB: [birth year] DOA: 08/23/2023 PCP: Elizabeth Palau, FNP  Brief Narrative/Hospital Course: Sherri Schultz is a 57 y.o. female with a history of OSA,breast cancer, migraine headaches, right leg melanoma, anxiety, hypertension, malignant pleural effusion presented secondary to worsening shortness of breath and found to have recurrent left sided pleural effusion requiring thoracentesis on admission on 9/13 and admitted to stepdown unit. Some improvement immediately after thoracentesis with worsening dyspnea likely related to re-accumulation.  Pulmonary critical care following.  Patient with ongoing oxygen need, had worsening oxygen requirement 9/17 night 9/18 AM steroid trial started, remains on diuretics and on serial ultrasound to assess for appropriate pocket for Pleurx catheter. Seen by her oncologist again 9/20, planning to continue chemotherapy as outpatient   Subjective: Patient seen examined this morning husband at the bedside Complains of mild cough no new complaints Overnight afebrile but has been tachypneic variable 21-32, on 10 L HFNC saturating 90% Labs remains overall stable, ongoing leukocytosis with a steroid use stable renal function  Assessment and Plan: Principal Problem:   Acute respiratory distress Active Problems:   Acute respiratory failure with hypoxia (HCC)   Malignant pleural effusion   Acute on chronic respiratory failure with hypoxia Recurrent malignant left-sided pleural effusion-positive cytology 9/9 possible breast/mllerian origin: Hypoxia in the setting of pleural effusion and compressive atelectasis AND parenchymal disease. US guided thoracentesis on 9/13 w/  710 cc removal and patient felt better initially> unable to place Pleurx catheter as not enough fluid On HFNC since 9/17 night,PCCM following closely-continue fluid pockets to consider Pleurx catheter at this time-again no large pocket of  fluid seen on ultrasound at bedside by PCCM today. Cont antitussives, IV diuretics, Solu-Medrol trial, and weaning oxygen as tolerated.  Continue plan per PCCM. Total net neg as below: Net IO Since Admission: -2,830.39 mL [08/16/23 1130]   Metastatic breast cancer with lung and pleural mets Bilateral shoulder and neck pain and cervical spine metastasis: S/p radiation therapy 9/17,9/19, 9/20 to spinal mets and pain improving Continue her pain management-oxycodone, Atarax, tizanidine prn  Mild hyponatremia: Resolved.   Hypokalemia: Resolved  Anemia of chronic disease likely from malignancy:hemoglobin overall stable 11-12gm Leukocytosis-in the setting of steroid use.Afebrile.  Monitor  GERD-continue PPI  Anxiety disorder: Continue Effexor,inderal and atarax.  Tachycardia, chronic:likely in the setting of malignancy, deconditioning.  Improved, on inderal  VQQ:VZDGLOV reports she was supposed to get her CPAP machine after her diagnosis back in June but has not been approved and PCCM following up  Constipation: Chronic normally goes once a week, continue stool softener scheduled and lactulose  prn.  DVT prophylaxis: heparin injection 5,000 Units Start: 2023/08/15 2200 Code Status:   Code Status: Full Code Family Communication: plan of care discussed with patient at bedside. Patient status is: Inpatient because of respiratory failure Level of care: Stepdown   Dispo: The patient is from: Home with family            Anticipated disposition: TBD pending improvement in respiratory failure  Objective: Vitals last 24 hrs: Vitals:   08/16/23 0800 08/16/23 0829 08/16/23 0900 08/16/23 1015  BP: 120/79  106/66 104/73  Pulse: (!) 104  93 100  Resp: (!) 30  19   Temp:  98.7 F (37.1 C)    TempSrc:  Axillary    SpO2: 92%  92%   Weight:      Height:       Weight change:   Physical Examination: General exam: Aaox3 HEENT:Oral  mucosa moist, Ear/Nose WNL grossly, dentition  normal. Respiratory system: bilaterally crackles posteriorly, mildly tachypneic Cardiovascular system: S1 & S2 +, regular rate. Gastrointestinal system: Abdomen soft, NT,ND,BS+ Nervous System:Alert, awake, moving extremities and grossly nonfocal Extremities: LE ankle edema neg, lower extremities warm Skin: No rashes,no icterus. MSK: weak muscle bulk,tone, power   Medications reviewed:  Scheduled Meds:  Chlorhexidine Gluconate Cloth  6 each Topical Q0600   furosemide  40 mg Intravenous Daily   heparin injection (subcutaneous)  5,000 Units Subcutaneous Q8H   influenza vac split trivalent PF  0.5 mL Intramuscular Tomorrow-1000   ipratropium-albuterol  3 mL Nebulization BID   lidocaine  30 mL Intradermal Once   methylPREDNISolone (SOLU-MEDROL) injection  40 mg Intravenous Daily   pantoprazole  40 mg Oral Daily   propranolol ER  80 mg Oral Daily   senna-docusate  1 tablet Oral Daily   silver sulfADIAZINE   Topical Daily   venlafaxine XR  150 mg Oral Daily   Continuous Infusions:  sodium chloride Stopped (08/09/2023 1610)    Diet Order             Diet regular Room service appropriate? Yes; Fluid consistency: Thin  Diet effective now                  Intake/Output Summary (Last 24 hours) at 08/16/2023 1130 Last data filed at 08/16/2023 0804 Gross per 24 hour  Intake --  Output 500 ml  Net -500 ml   Net IO Since Admission: -2,830.39 mL [08/16/23 1130]  Wt Readings from Last 3 Encounters:  08/10/23 72.2 kg  08/04/23 74.8 kg  07/31/23 73.9 kg     Unresulted Labs (From admission, onward)     Start     Ordered   08/15/23 0500  Creatinine, serum  (enoxaparin (LOVENOX)    CrCl >/= 30 ml/min)  Weekly,   R     Comments: while on enoxaparin therapy    Sep 05, 2023 1637   08/14/23 0500  Basic metabolic panel  Daily,   R     Question:  Specimen collection method  Answer:  Lab=Lab collect   08/13/23 0815   08/14/23 0500  CBC  Daily,   R     Question:  Specimen collection method   Answer:  Lab=Lab collect   08/13/23 0815          Data Reviewed: I have personally reviewed following labs and imaging studies CBC: Recent Labs  Lab 08/12/23 0318 08/14/23 0326 08/15/23 0255 08/16/23 0313  WBC 10.0 11.7* 13.3* 14.3*  HGB 11.1* 12.2 11.5* 12.5  HCT 35.4* 38.4 35.8* 39.3  MCV 94.7 94.8 92.5 94.9  PLT 244 287 281 291   Basic Metabolic Panel: Recent Labs  Lab 08/13/2023 0325 08/12/23 0318 08/14/23 0326 08/15/23 0255 08/16/23 0313  NA 135 132* 131* 130* 135  K 3.4* 3.9 3.5 3.3* 3.5  CL 94* 94* 89* 87* 88*  CO2 29 24 27 27  32  GLUCOSE 105* 100* 110* 103* 118*  BUN 9 11 19  23* 27*  CREATININE 0.57 0.65 0.55 0.72 0.83  CALCIUM 8.8* 8.6* 9.1 8.8* 9.3  MG  --  1.9  --   --   --   PHOS  --  3.6  --   --   --   GFR: Estimated Creatinine Clearance: 74.6 mL/min (by C-G formula based on SCr of 0.83 mg/dL). No results for input(s): "PROCALCITON", "LATICACIDVEN" in the last 168 hours.  Recent Results (from the past 240  hour(s))  SARS Coronavirus 2 by RT PCR (hospital order, performed in [facility] hospital lab) *cepheid single result test* Anterior Nasal Swab     Status: None   Collection Time: 07/27/2023  1:48 PM   Specimen: Anterior Nasal Swab  Result Value Ref Range Status   SARS Coronavirus 2 by RT PCR NEGATIVE NEGATIVE Final    Comment: (NOTE) SARS-CoV-2 target nucleic acids are NOT DETECTED.  The SARS-CoV-2 RNA is generally detectable in upper and lower respiratory specimens during the acute phase of infection. The lowest concentration of SARS-CoV-2 viral copies this assay can detect is 250 copies / mL. A negative result does not preclude SARS-CoV-2 infection and should not be used as the sole basis for treatment or other patient management decisions.  A negative result may occur with improper specimen collection / handling, submission of specimen other than nasopharyngeal swab, presence of viral mutation(s) within the areas targeted by this assay, and  inadequate number of viral copies (<250 copies / mL). A negative result must be combined with clinical observations, patient history, and epidemiological information.  Fact Sheet for Patients:   RoadLapTop.co.za  Fact Sheet for Healthcare Providers: http://kim-miller.com/  This test is not yet approved or  cleared by the [place] FDA and has been authorized for detection and/or diagnosis of SARS-CoV-2 by FDA under an Emergency Use Authorization (EUA).  This EUA will remain in effect (meaning this test can be used) for the duration of the COVID-19 declaration under Section 564(b)(1) of the Act, 21 U.S.C. section 360bbb-3(b)(1), unless the authorization is terminated or revoked sooner.  Performed at [facility], [address] [place], [place] [address]   MRSA Next Gen by PCR, Nasal     Status: None   Collection Time: 08/09/23  3:41 AM   Specimen: Nasal Mucosa; Nasal Swab  Result Value Ref Range Status   MRSA by PCR Next Gen NOT DETECTED NOT DETECTED Final    Comment: (NOTE) The GeneXpert MRSA Assay (FDA approved for NASAL specimens only), is one component of a comprehensive MRSA colonization surveillance program. It is not intended to diagnose MRSA infection nor to guide or monitor treatment for MRSA infections. Test performance is not FDA approved in patients less than 22 years old. Performed at [facility], [address] [place], [place] [address]   Antimicrobials: Anti-infectives (From admission, onward)    None     Culture/Microbiology    Component Value Date/Time   SDES PLEURAL 07/18/2023 1108   SDES PLEURAL 07/18/2023 1108   SPECREQUEST NONE 07/18/2023 1108   SPECREQUEST NONE 07/18/2023 1108   CULT  07/18/2023 1108    NO GROWTH 5 DAYS Performed at [facility], [address] [place], [place] [address]    REPTSTATUS 07/23/2023 FINAL 07/18/2023 1108   REPTSTATUS  07/18/2023 FINAL 07/18/2023 1108   Radiology Studies: No results found.   LOS: 8 days   Lanae Boast, MD [facility]  08/16/2023, 11:30 AM

## 2023-08-16 NOTE — Plan of Care (Signed)
  Problem: Education: Goal: Knowledge of General Education information will improve Description: Including pain rating scale, medication(s)/side effects and non-pharmacologic comfort measures Outcome: Progressing   Problem: Health Behavior/Discharge Planning: Goal: Ability to manage health-related needs will improve Outcome: Progressing   Problem: Clinical Measurements: Goal: Ability to maintain clinical measurements within normal limits will improve Outcome: Progressing Goal: Will remain free from infection Outcome: Progressing Goal: Cardiovascular complication will be avoided Outcome: Progressing   Problem: Activity: Goal: Risk for activity intolerance will decrease Outcome: Progressing   Problem: Nutrition: Goal: Adequate nutrition will be maintained Outcome: Progressing   Problem: Elimination: Goal: Will not experience complications related to urinary retention Outcome: Progressing   Problem: Pain Managment: Goal: General experience of comfort will improve Outcome: Progressing   Problem: Safety: Goal: Ability to remain free from injury will improve Outcome: Progressing   Problem: Skin Integrity: Goal: Risk for impaired skin integrity will decrease Outcome: Progressing   Problem: Clinical Measurements: Goal: Diagnostic test results will improve Outcome: Not Progressing Goal: Respiratory complications will improve Outcome: Not Progressing   Problem: Coping: Goal: Level of anxiety will decrease Outcome: Not Progressing   Problem: Elimination: Goal: Will not experience complications related to bowel motility Outcome: Not Progressing

## 2023-08-16 NOTE — Progress Notes (Signed)
NAME:  Sherri Schultz, MRN:  [id number], DOB:  October 09, 1966, LOS: 8 ADMISSION DATE:  07/27/2023, CONSULTATION DATE:  08/16/23 REFERRING MD:  Caleb Popp - TR[facility], CHIEF COMPLAINT:  DOE   History of Present Illness:  57 year old woman with history malignant pleural effusion, chronic hypoxemia on 3 L nasal cannula at home presents with worsening dyspnea and hypoxemia. PMHx significant for HTN, OSA, L breast CA (DCIS), GERD, anxiety.  Contacted pulmonary office.  Has been trying to set up outpatient Pleurx.  Worsening symptoms but unable to get Pleurx done in short notice.  Advised to go to ED.  Chest x-ray showed loculated left pleural effusion on my review interpretation.  As well as scattered multifocal infiltrates consistent with parenchymal lung disease, cancerous signs on my interpretation.  Underwent thoracentesis with IR.  Improvement in hypoxemia.  From nonrebreather down to 4 L at time of evaluation.  Sats high 90s.  Reports improved dyspnea as well.  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   Cancer (HCC) 2013   right knee   Dyspnea    Family history of brain cancer    Family history of breast cancer    Family history of colon cancer    Family history of melanoma    GERD (gastroesophageal reflux disease)    Headache    Hypertension    Personal history of radiation therapy    Sleep apnea    Trimalleolar fracture of ankle, closed, left, initial encounter    Significant Hospital Events: Including procedures, antibiotic start and stop dates in addition to other pertinent events   9/13 Admitted to the hospital with worsening dyspnea on exertion and hypoxemia with recurrent left pleural effusion, improvement in symptoms and hypoxemia after thoracentesis Aug 23, 2023 Stable O2 requirements, 10L [place]. 9/17 Radiation today, tolerated well. O2 requirements stable.  Interim History / Subjective:   Still  on 10 l/min Intermittent dyspnea, is working to get up out of bed to chair Complains of sore throat   Objective:   Blood pressure 104/73, pulse 100, temperature 98.7 F (37.1 C), temperature source Axillary, resp. rate 19, height 5' 4.5" (1.638 m), weight 72.2 kg, last menstrual period 03/27/2011, SpO2 92%.        Intake/Output Summary (Last 24 hours) at 08/16/2023 1037 Last data filed at 08/16/2023 0804 Gross per 24 hour  Intake --  Output 500 ml  Net -500 ml   Filed Weights   08/07/2023 1245 08/09/23 0340 08/10/23 1930  Weight: 78.4 kg 73.1 kg 72.2 kg   Physical Examination: General: Chronically ill, no distress in bed Neuro: Awake, alert, nonfocal HEENT: Some tongue erythema, edema but no thrush Cardiovascular: Distant, no murmur Lungs: Coarse on the left Abdomen: Nondistended with positive bowel sounds Musculoskeletal: No edema Skin: No rash  Bedside US performed by me 9/21.  Good sliding lung, very small amount of pleural fluid on the left.  No large pocket to tap   Assessment & Plan:  Acute on chronic hypoxemic respiratory failure Recurrent left-sided effusion, malignant, breast/mllerian.  Apparent breast cancer in absence of any pelvic abnormality on CT abdomen Cyto 9/9 IHC stains demonstrating tumor cells positive for CK7, focal PAX8, and focal GATA3 and negative for CDX2 and CK20, indicating possible breast/mullerian origin. S/p IR thoracentesis 9/13 with of amber fluid removed. -I do not see a large pocket of pleural fluid on ultrasound today 9/21.  She may still ultimately benefit from a Pleurx given the history of reaccumulation. -We will obtain a CT scan of her chest to better characterize the parenchyma and the pleural fluid amount.  If there is enough a Pleurx we can arrange for beginning of the week -Suspect that her parenchymal burden on the left is contributing significantly to her persistent hypoxemia.  She was seen by Dr. Al Pimple on 9/20.  Chemotherapy or  targeted therapy being discussed based on her molecular studies.  It does not sound like there is a role for inpatient treatment to impact left tumor burden, hypoxemia -She has completed planned XRT to her spinal mets -Wean FiO2 as able -Diuresis if she can tolerate -Pulmonary hygiene, PT/OT will help with her oxygen needs   Levy Pupa, MD, PhD 08/16/2023, 10:37 AM [facility] [phone] or if no answer before 7:00PM call [phone] For any issues after 7:00PM please call eLink [phone]

## 2023-08-16 NOTE — Progress Notes (Signed)
When RT went to give breathing tx pt sats was 87% while she was talking to order her breakfast. Rt increased 02 to 15 LPM HF from 10 LPM HF tell pt recovered. Pt now back to baseline 10 LPM HF sats 91%.

## 2023-08-16 NOTE — Plan of Care (Signed)

## 2023-08-16 NOTE — Plan of Care (Signed)
Discussed with patient plan of care for the evening, pain management and oxygen levels with some teach back displayed.  What is important to the patient is decreased short ness of breathe when ambulating up to the bedside commode.  Her oxygen has to be increased and she takes awhile to recover.    Problem: Education: Goal: Knowledge of General Education information will improve Description: Including pain rating scale, medication(s)/side effects and non-pharmacologic comfort measures Outcome: Progressing   Problem: Health Behavior/Discharge Planning: Goal: Ability to manage health-related needs will improve Outcome: Progressing

## 2023-08-17 ENCOUNTER — Inpatient Hospital Stay (HOSPITAL_COMMUNITY): Payer: BC Managed Care – PPO

## 2023-08-17 DIAGNOSIS — R0603 Acute respiratory distress: Secondary | ICD-10-CM | POA: Diagnosis not present

## 2023-08-17 DIAGNOSIS — J9601 Acute respiratory failure with hypoxia: Secondary | ICD-10-CM | POA: Diagnosis not present

## 2023-08-17 LAB — CBC
HCT: 40.1 % (ref 36.0–46.0)
Hemoglobin: 12.6 g/dL (ref 12.0–15.0)
MCH: 29.1 pg (ref 26.0–34.0)
MCHC: 31.4 g/dL (ref 30.0–36.0)
MCV: 92.6 fL (ref 80.0–100.0)
Platelets: 233 10*3/uL (ref 150–400)
RBC: 4.33 MIL/uL (ref 3.87–5.11)
RDW: 12.5 % (ref 11.5–15.5)
WBC: 16.9 10*3/uL — ABNORMAL HIGH (ref 4.0–10.5)
nRBC: 0.2 % (ref 0.0–0.2)

## 2023-08-17 LAB — BLOOD GAS, VENOUS
Acid-Base Excess: 14.7 mmol/L — ABNORMAL HIGH (ref 0.0–2.0)
Bicarbonate: 41.3 mmol/L — ABNORMAL HIGH (ref 20.0–28.0)
O2 Saturation: 23.9 %
Patient temperature: 37.1
pCO2, Ven: 58 mmHg (ref 44–60)
pH, Ven: 7.46 — ABNORMAL HIGH (ref 7.25–7.43)
pO2, Ven: <31 mmHg — CL (ref 32–45)

## 2023-08-17 LAB — BASIC METABOLIC PANEL
Anion gap: 17 — ABNORMAL HIGH (ref 5–15)
BUN: 27 mg/dL — ABNORMAL HIGH (ref 6–20)
CO2: 32 mmol/L (ref 22–32)
Calcium: 9.2 mg/dL (ref 8.9–10.3)
Chloride: 84 mmol/L — ABNORMAL LOW (ref 98–111)
Creatinine, Ser: 0.8 mg/dL (ref 0.44–1.00)
GFR, Estimated: >60 mL/min (ref >60)
Glucose, Bld: 112 mg/dL — ABNORMAL HIGH (ref 70–99)
Potassium: 3.8 mmol/L (ref 3.5–5.1)
Sodium: 133 mmol/L — ABNORMAL LOW (ref 135–145)

## 2023-08-17 MED ORDER — HYDROXYZINE HCL 50 MG/ML IM SOLN: 25.0000 mg | Freq: Four times a day (QID) | INTRAMUSCULAR | Status: DC | PRN

## 2023-08-17 MED ORDER — MORPHINE SULFATE (PF) 4 MG/ML IV SOLN
5.0000 mg | INTRAVENOUS | Status: DC | PRN
  Administered 2023-08-17: 4 mg via INTRAVENOUS
  Administered 2023-08-18 – 2023-08-20 (×8): 5 mg via INTRAVENOUS
  Filled 2023-08-17 (×10): qty 2

## 2023-08-17 MED ORDER — HYDROXYZINE HCL 25 MG PO TABS
25.0000 mg | ORAL_TABLET | Freq: Four times a day (QID) | ORAL | Status: DC | PRN
  Administered 2023-08-18 (×2): 25 mg via ORAL
  Filled 2023-08-17 (×2): qty 1

## 2023-08-17 MED ORDER — BISACODYL 10 MG RE SUPP
10.0000 mg | Freq: Every day | RECTAL | Status: DC | PRN
  Administered 2023-08-17: 10 mg via RECTAL
  Filled 2023-08-17: qty 1

## 2023-08-17 MED ORDER — METHYLPREDNISOLONE SODIUM SUCC 40 MG IJ SOLR
40.0000 mg | Freq: Once | INTRAMUSCULAR | Status: AC
  Administered 2023-08-17: 40 mg via INTRAVENOUS
  Filled 2023-08-17: qty 1

## 2023-08-17 MED ORDER — ORAL CARE MOUTH RINSE: 15.0000 mL | OROMUCOSAL | Status: DC | PRN

## 2023-08-17 NOTE — Progress Notes (Signed)
PROGRESS NOTE Sherri Schultz  ZOX:[id number] DOB: November 23, 1966 DOA: 07/28/2023 PCP: Elizabeth Palau, FNP  Brief Narrative/Hospital Course: Sherri Schultz is a 57 y.o. female with a history of OSA,breast cancer, migraine headaches, right leg melanoma, anxiety, hypertension, malignant pleural effusion presented secondary to worsening shortness of breath and found to have recurrent left sided pleural effusion requiring thoracentesis on admission on 9/13 and admitted to stepdown unit. Some improvement immediately after thoracentesis with worsening dyspnea likely related to re-accumulation.  Pulmonary critical care following.  Patient with ongoing oxygen need, had worsening oxygen requirement 9/17 night 9/18 AM steroid trial started, remains on diuretics and on serial ultrasound to assess for appropriate pocket for Pleurx catheter. Seen by her oncologist again 9/20, planning to continue chemotherapy as outpatient   Subjective: Patient seen and examined On bedside chair Overnight she had been afebrile, does have elevated WBC count but on steroid remains tachypneic, oxygen demand has further increased and had been placed on HHFNC up to 50 L/min   Assessment and Plan: Principal Problem:   Acute respiratory distress Active Problems:   Acute respiratory failure with hypoxia (HCC)   Malignant pleural effusion   Acute on chronic respiratory failure with hypoxia Recurrent malignant left-sided pleural effusion-positive cytology 9/9 possible breast/mllerian origin: Hypoxia in the setting of pleural effusion and compressive atelectasis AND parenchymal disease. US guided thoracentesis on 9/13 w/  710 cc removal and patient felt better initially> unable to place Pleurx catheter as not enough fluid. On HFNC since 9/17 night,PCCM following closely-continue fluid pockets to consider Pleurx catheter at this time-again no large pocket of fluid seen on [place].  Seen by Dr. Alcario Drought from oncology no  role of inpatient chemo for now it seems. 9/21 night further worsening of hypoxia noted HHFNC, CT chest with moderate multiloculated left pleural effusion with some decrease in size compared to prior, heterogeneous bilateral airspace disease unchanged, left anterior chest mass 2X 1.9 cm left parasternal, check Pro-Cal. PCCM following closely>continue current Lasix 40 mg, Solu-Medrol 40 mg aggressive bronchodilators supplemental oxygen and wean as tolerated .Continue antitussives.  Given loculated pleural effusion PCCM planning to discuss with IR for Pleurx placement.  She is concerned why she is not getting chemo, relayed message to her primary oncologist will see her tomorrow. 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  Tachycardia, chronic: likely in the setting of malignancy, deconditioning.  Improved, on inderal  OSA: Patient reports she was supposed to get her CPAP machine after her diagnosis back in June but has not been approved and PCCM following up  Constipation: Chronic normally goes once a week, continue stool softener scheduled and lactulose  prn. Add dulcolax PR.  DVT prophylaxis: heparin injection 5,000 Units Start: 08/19/2023 2200 Code Status:   Code Status: Full Code Family Communication: plan of care discussed with patient at bedside. Patient status is: Inpatient because of respiratory failure Level of care:  Stepdown   Dispo: The patient is from: Home with family  Anticipated disposition: TBD pending improvement in respiratory failure  Objective: Vitals last 24 hrs: Vitals:   08/17/23 0700 08/17/23 0800 08/17/23 0858 08/17/23 0951  BP:  117/81  121/74  Pulse: 92 93  (!) 102  Resp: 19 (!) 22    Temp:   98.9 F (37.2 C)   TempSrc:   Oral   SpO2: 92% 92%    Weight:      Height:       Weight change:   Physical Examination: General exam: alert awake, oriented x3, older than stated age, chronically sick looking  o nHHFNC HEENT:Oral mucosa moist, Ear/Nose WNL grossly Respiratory system: Bilaterally crackles posterior lungs tachypnea  Cardiovascular system: S1 & S2 +, No JVD. Gastrointestinal system: Abdomen soft,NT,ND, BS+ Nervous System: Alert, awake, moving all extremities,and following commands. Extremities: LE edema neg,distal peripheral pulses palpable and warm.  Skin: No rashes,no icterus. MSK: Normal muscle bulk,tone, power   Medications reviewed:  Scheduled Meds:  Chlorhexidine Gluconate Cloth  6 each Topical Q0600   furosemide  40 mg Intravenous Daily   heparin injection (subcutaneous)  5,000 Units Subcutaneous Q8H   influenza vac split trivalent PF  0.5 mL Intramuscular Tomorrow-1000   ipratropium-albuterol  3 mL Nebulization BID   lidocaine  30 mL Intradermal Once   methylPREDNISolone (SOLU-MEDROL) injection  40 mg Intravenous Daily   pantoprazole  40 mg Oral Daily   propranolol ER  80 mg Oral Daily   senna-docusate  1 tablet Oral Daily   silver sulfADIAZINE   Topical Daily   venlafaxine XR  150 mg Oral Daily   Continuous Infusions:  sodium chloride Stopped (08/16/2023 4034)    Diet Order             Diet regular Room service appropriate? Yes; Fluid consistency: Thin  Diet effective now                  Intake/Output Summary (Last 24 hours) at 08/17/2023 1002 Last data filed at 08/17/2023 7425 Gross per 24 hour  Intake 210.24 ml  Output 200 ml   Net 10.24 ml   Net IO Since Admission: -2,820.15 mL [08/17/23 1002]  Wt Readings from Last 3 Encounters:  08/10/23 72.2 kg  08/04/23 74.8 kg  07/31/23 73.9 kg     Unresulted Labs (From admission, onward)     Start     Ordered   08/18/23 0500  Protime-INR  Tomorrow morning,   R       Question:  Specimen collection method  Answer:  Lab=Lab collect   08/17/23 0913   08/17/23 0745  Procalcitonin  Add-on,   AD       References:    Procalcitonin Lower Respiratory Tract Infection AND Sepsis Procalcitonin Algorithm  Question:  Specimen collection method  Answer:  Lab=Lab collect   08/17/23 0744   08/15/23 0500  Creatinine, serum  (enoxaparin (LOVENOX)    CrCl >/= 30 ml/min)  Weekly,   R     Comments: while on enoxaparin therapy    08/21/2023 1637   08/14/23 0500  Basic metabolic panel  Daily,   R     Question:  Specimen collection method  Answer:  Lab=Lab collect   08/13/23 0815   08/14/23 0500  CBC  Daily,   R     Question:  Specimen collection method  Answer:  Lab=Lab collect   08/13/23 0815          Data Reviewed: I have personally reviewed following labs and imaging studies  CBC: Recent Labs  Lab 08/12/23 0318 08/14/23 0326 08/15/23 0255 08/16/23 0313 08/17/23 0043  WBC 10.0 11.7* 13.3* 14.3* 16.9*  HGB 11.1* 12.2 11.5* 12.5 12.6  HCT 35.4* 38.4 35.8* 39.3 40.1  MCV 94.7 94.8 92.5 94.9 92.6  PLT 244 287 281 291 233   Basic Metabolic Panel: Recent Labs  Lab 08/12/23 0318 08/14/23 0326 08/15/23 0255 08/16/23 0313 08/17/23 0043  NA 132* 131* 130* 135 133*  K 3.9 3.5 3.3* 3.5 3.8  CL 94* 89* 87* 88* 84*  CO2 24 27 27  32 32  GLUCOSE 100* 110* 103* 118* 112*  BUN 11 19 23* 27* 27*  CREATININE 0.65 0.55 0.72 0.83 0.80  CALCIUM 8.6* 9.1 8.8* 9.3 9.2  MG 1.9  --   --   --   --   PHOS 3.6  --   --   --   --   GFR: Estimated Creatinine Clearance: 77.4 mL/min (by C-G formula based on SCr of 0.8 mg/dL). No results for input(s): "PROCALCITON", "LATICACIDVEN" in the  last 168 hours.  Recent Results (from the past 240 hour(s))  SARS Coronavirus 2 by RT PCR (hospital order, performed in [facility] hospital lab) *cepheid single result test* Anterior Nasal Swab     Status: None   Collection Time: 08/06/2023  1:48 PM   Specimen: Anterior Nasal Swab  Result Value Ref Range Status   SARS Coronavirus 2 by RT PCR NEGATIVE NEGATIVE Final    Comment: (NOTE) SARS-CoV-2 target nucleic acids are NOT DETECTED.  The SARS-CoV-2 RNA is generally detectable in upper and lower respiratory specimens during the acute phase of infection. The lowest concentration of SARS-CoV-2 viral copies this assay can detect is 250 copies / mL. A negative result does not preclude SARS-CoV-2 infection and should not be used as the sole basis for treatment or other patient management decisions.  A negative result may occur with improper specimen collection / handling, submission of specimen other than nasopharyngeal swab, presence of viral mutation(s) within the areas targeted by this assay, and inadequate number of viral copies (<250 copies / mL). A negative result must be combined with clinical observations, patient history, and epidemiological information.  Fact Sheet for Patients:   RoadLapTop.co.za  Fact Sheet for Healthcare Providers: http://kim-miller.com/  This test is not yet approved or  cleared by the [place] FDA and has been authorized for detection and/or diagnosis of SARS-CoV-2 by FDA under an Emergency Use Authorization (EUA).  This EUA will remain in effect (meaning this test can be used) for the duration of the COVID-19 declaration under Section 564(b)(1) of the Act, 21 U.S.C. section 360bbb-3(b)(1), unless the authorization is terminated or revoked sooner.  Performed at [facility], [address] [place], [place] [address]   MRSA Next Gen by PCR, Nasal     Status: None   Collection Time:  08/09/23  3:41 AM   Specimen: Nasal Mucosa; Nasal Swab  Result Value Ref Range Status   MRSA by PCR Next Gen NOT DETECTED NOT DETECTED Final    Comment: (NOTE) The GeneXpert MRSA Assay (FDA approved for NASAL specimens only), is one component of a comprehensive MRSA colonization surveillance program. It is not intended to diagnose MRSA infection nor to guide or monitor treatment for MRSA infections. Test performance is not FDA approved in patients less than 62 years old. Performed at [facility], [address], [place], [place] [address]   Antimicrobials: Anti-infectives (From admission, onward)  None     Culture/Microbiology    Component Value Date/Time   SDES PLEURAL 07/18/2023 1108   SDES PLEURAL 07/18/2023 1108   SPECREQUEST NONE 07/18/2023 1108   SPECREQUEST NONE 07/18/2023 1108   CULT  07/18/2023 1108    NO GROWTH 5 DAYS Performed at [facility], [address] [place], [place] [address]    REPTSTATUS 07/23/2023 FINAL 07/18/2023 1108   REPTSTATUS 07/18/2023 FINAL 07/18/2023 1108   Radiology Studies: DG Chest Port 1 View  Result Date: 08/17/2023 CLINICAL DATA:  Respiratory distress EXAM: PORTABLE CHEST 1 VIEW COMPARISON:  08/13/2023 FINDINGS: Lung volumes are small. Stable asymmetric left-sided volume loss. Left pleural thickening or laterally loculated left pleural effusion is unchanged. Extensive asymmetric multifocal pulmonary infiltrate is again seen related to changes of probable lymphangitic spread of malignancy better appreciated on prior CT examination cardiac size within normal limits. No acute bone abnormality. IMPRESSION: 1. No significant interval change. Extensive asymmetric multifocal pulmonary infiltrate related to changes of probable lymphangitic spread of malignancy better appreciated on prior CT examination. Electronically Signed   By: Helyn Numbers M.D.   On: 08/17/2023 02:03   CT CHEST W CONTRAST  Result Date:  08/16/2023 CLINICAL DATA:  Left non-small-cell lung carcinoma. Left pleural effusion. Staging. * Tracking Code: BO * EXAM: CT CHEST WITH CONTRAST TECHNIQUE: Multidetector CT imaging of the chest was performed during intravenous contrast administration. RADIATION DOSE REDUCTION: This exam was performed according to the departmental dose-optimization program which includes automated exposure control, adjustment of the mA and/or kV according to patient size and/or use of iterative reconstruction technique. CONTRAST:  75mL OMNIPAQUE IOHEXOL 300 MG/ML  SOLN COMPARISON:  07/29/2023 FINDINGS: Cardiovascular:  No acute findings. Mediastinum/Nodes: Mediastinal lymphadenopathy in the right paratracheal and subcarinal regions shows no significant change, with largest area in the right paratracheal region measuring 3.5 x 2.0 cm on image 46/2. Mild bilateral hilar lymphadenopathy also without significant change. Lungs/Pleura: Moderate multiloculated left pleural effusion shows mild decrease in size since previous study. Diffuse pulmonary interstitial thickening and heterogeneous airspace disease is again seen bilaterally, greatest in the lower lobes, also without significant change. Upper Abdomen:  Unremarkable. Musculoskeletal: New small left anterior chest wall mass seen in the left parasternal region, measuring 2.0 x 1.9 cm on image 60/2. No suspicious bone lesions. IMPRESSION: Stable mediastinal and bilateral hilar lymphadenopathy. No significant change in diffuse interstitial infiltrates and heterogeneous airspace disease, greatest in the lower lobes. New 2 cm left parasternal chest wall mass, suspicious for metastatic disease. Mild decrease in size of moderate multiloculated left pleural effusion. Electronically Signed   By: Danae Orleans M.D.   On: 08/16/2023 15:02     LOS: 9 days   Lanae Boast, MD [facility]  08/17/2023, 10:02 AM

## 2023-08-17 NOTE — Plan of Care (Signed)
Problem: Clinical Measurements: Goal: Will remain free from infection Outcome: Progressing Goal: Cardiovascular complication will be avoided Outcome: Progressing   Problem: Nutrition: Goal: Adequate nutrition will be maintained Outcome: Progressing   Problem: Pain Managment: Goal: General experience of comfort will improve Outcome: Progressing   Problem: Clinical Measurements: Goal: Ability to maintain clinical measurements within normal limits will improve Outcome: Not Progressing Goal: Diagnostic test results will improve Outcome: Not Progressing Goal: Respiratory complications will improve Outcome: Not Progressing   Problem: Activity: Goal: Risk for activity intolerance will decrease Outcome: Not Progressing   Problem: Coping: Goal: Level of anxiety will decrease Outcome: Not Progressing   Problem: Elimination: Goal: Will not experience complications related to bowel motility Outcome: Not Progressing

## 2023-08-17 NOTE — Progress Notes (Signed)
NAME:  Sherri Schultz, MRN:  [id number], DOB:  1966-11-19, LOS: 9 ADMISSION DATE:  08/13/2023, CONSULTATION DATE:  08/17/23 REFERRING MD:  Caleb Popp - [facility], CHIEF COMPLAINT:  DOE   History of Present Illness:  57 year old woman with history malignant pleural effusion, chronic hypoxemia on 3 L nasal cannula at home presents with worsening dyspnea and hypoxemia. PMHx significant for HTN, OSA, L breast CA (DCIS), GERD, anxiety.  Contacted pulmonary office.  Has been trying to set up outpatient Pleurx.  Worsening symptoms but unable to get Pleurx done in short notice.  Advised to go to ED.  Chest x-ray showed loculated left pleural effusion on my review interpretation.  As well as scattered multifocal infiltrates consistent with parenchymal lung disease, cancerous signs on my interpretation.  Underwent thoracentesis with IR.  Improvement in hypoxemia.  From nonrebreather down to 4 L at time of evaluation.  Sats high 90s.  Reports improved dyspnea as well.  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   Cancer (HCC) 2013   right knee   Dyspnea    Family history of brain cancer    Family history of breast cancer    Family history of colon cancer    Family history of melanoma    GERD (gastroesophageal reflux disease)    Headache    Hypertension    Personal history of radiation therapy    Sleep apnea    Trimalleolar fracture of ankle, closed, left, initial encounter    Significant Hospital Events: Including procedures, antibiotic start and stop dates in addition to other pertinent events   9/13 Admitted to the hospital with worsening dyspnea on exertion and hypoxemia with recurrent left pleural effusion, improvement in symptoms and hypoxemia after thoracentesis 2023/08/14 Stable O2 requirements, 10L [place]. 9/17 Radiation today, tolerated well. O2 requirements stable. 9/21 CT chest > moderate multiloculated  left pleural effusion with some decrease in size compared with priors.  Heterogeneous bilateral airspace disease, unchanged.  New small left anterior chest wall mass 2.0 x 1.9 cm left parasternal region  Interim History / Subjective:  Desaturation last night, high flow nasal cannula increased, currently on 30 L/min   Objective:   Blood pressure 117/75, pulse 92, temperature 97.9 F (36.6 C), temperature source Oral, resp. rate 19, height 5' 4.5" (1.638 m), weight 72.2 kg, last menstrual period 03/27/2011, SpO2 92%.    FiO2 (%):  [70 %-80 %] 70 %   Intake/Output Summary (Last 24 hours) at 08/17/2023 0738 Last data filed at 08/17/2023 0226 Gross per 24 hour  Intake 210.24 ml  Output 300 ml  Net -89.76 ml   Filed Weights   08/06/2023 1245 08/09/23 0340 08/10/23 1930  Weight: 78.4 kg 73.1 kg 72.2 kg   Physical Examination: General: Chronically ill, up to chair.  Looks more tired today Neuro: Awake, alert, appropriate, follows commands, no focal HEENT: Oropharynx clear, no secretions, strong voice, no stridor Cardiovascular: Distant, regular, no murmur Lungs: Comfortable respiratory pattern, coarse breath sounds on the left, clear on the right Abdomen: Nondistended with positive bowel sounds Musculoskeletal: No edema Skin: No rash   Assessment & Plan:  Acute on chronic hypoxemic respiratory failure Recurrent left-sided effusion, malignant, breast/mllerian.  Apparent breast cancer in absence of any pelvic abnormality on CT abdomen Cyto 9/9 IHC stains demonstrating tumor cells positive for CK7, focal PAX8, and focal GATA3 and negative for CDX2 and CK20,  indicating possible breast/mullerian origin. S/p IR thoracentesis 9/13 with of amber fluid removed. -Have discussed Pleurx catheter with her, believe there would still be some benefit although her tumor burden is likely a significant contributor to her hypoxemia.  Given the loculated nature of the fluid will discuss possible Pleurx  placement with IR -Appreciate input from Dr Al Pimple.  Plans in place to consider chemotherapy as an outpatient.  Unfortunately unclear to me that she can be on less oxygen given the tumor burden.  If no options to treat systemically as an inpatient then may need to consider goals of care, hospice -She has completed XRT to her spinal mets -Wean FiO2 if we are able -Diuresis as she can tolerate (-2.8 L total) -OOB, PT/OT, ambulate if possible    Levy Pupa, MD, PhD 08/17/2023, 7:38 AM [facility] [phone] or if no answer before 7:00PM call [phone] For any issues after 7:00PM please call eLink [phone]

## 2023-08-17 NOTE — Consult Note (Signed)
is clear. No oropharyngeal exudate or posterior oropharyngeal erythema.  Cardiovascular:     Rate and Rhythm: Normal rate and regular rhythm.  Pulmonary:     Comments: HHFNC in place Coarse breath sounds LLL Skin:    General: Skin is warm and dry.  Neurological:     Mental Status: She is alert and oriented to person, place, and time.  Psychiatric:        Mood and Affect: Mood normal.        Behavior: Behavior normal.        Thought Content: Thought content normal.        Judgment: Judgment normal.      MD Evaluation Airway: WNL Heart: WNL Abdomen: WNL Chest/ Lungs:  (HHFNC) ASA  Classification: 3 Mallampati/Airway Score: Two   Imaging: DG Chest Port 1 View  Result Date: 08/17/2023 CLINICAL DATA:  Respiratory distress EXAM: PORTABLE CHEST 1 VIEW COMPARISON:  08/13/2023 FINDINGS: Lung volumes are small. Stable asymmetric left-sided volume loss. Left pleural thickening or laterally loculated left pleural effusion is unchanged. Extensive asymmetric multifocal pulmonary infiltrate is again seen related to changes of probable lymphangitic spread of malignancy better appreciated on prior CT examination cardiac size within normal limits. No acute bone abnormality. IMPRESSION: 1. No significant interval change. Extensive asymmetric multifocal pulmonary infiltrate related to changes of probable lymphangitic spread of malignancy better appreciated on prior CT examination. Electronically Signed   By: Helyn Numbers M.D.   On: 08/17/2023 02:03   CT CHEST W CONTRAST  Result Date: 08/16/2023 CLINICAL DATA:  Left non-small-cell lung carcinoma. Left pleural effusion. Staging. * Tracking Code: BO * EXAM: CT CHEST WITH CONTRAST TECHNIQUE: Multidetector CT imaging of the chest was performed during intravenous contrast administration. RADIATION DOSE REDUCTION: This exam was performed according to the departmental dose-optimization program which includes automated exposure control, adjustment  of the mA and/or kV according to patient size and/or use of iterative reconstruction technique. CONTRAST:  75mL OMNIPAQUE IOHEXOL 300 MG/ML  SOLN COMPARISON:  07/29/2023 FINDINGS: Cardiovascular:  No acute findings. Mediastinum/Nodes: Mediastinal lymphadenopathy in the right paratracheal and subcarinal regions shows no significant change, with largest area in the right paratracheal region measuring 3.5 x 2.0 cm on image 46/2. Mild bilateral hilar lymphadenopathy also without significant change. Lungs/Pleura: Moderate multiloculated left pleural effusion shows mild decrease in size since previous study. Diffuse pulmonary interstitial thickening and heterogeneous airspace disease is again seen bilaterally, greatest in the lower lobes, also without significant change. Upper Abdomen:  Unremarkable. Musculoskeletal: New small left anterior chest wall mass seen in the left parasternal region, measuring 2.0 x 1.9 cm on image 60/2. No suspicious bone lesions. IMPRESSION: Stable mediastinal and bilateral hilar lymphadenopathy. No significant change in diffuse interstitial infiltrates and heterogeneous airspace disease, greatest in the lower lobes. New 2 cm left parasternal chest wall mass, suspicious for metastatic disease. Mild decrease in size of moderate multiloculated left pleural effusion. Electronically Signed   By: Danae Orleans M.D.   On: 08/16/2023 15:02   DG Chest Port 1 View  Result Date: 08/13/2023 CLINICAL DATA:  Respiratory failure. EXAM: PORTABLE CHEST 1 VIEW COMPARISON:  08/14/2023.  Chest CT dated 07/29/2023. FINDINGS: Poor inspiration. Stable normal sized heart. Patchy airspace opacities throughout both lungs with mild improvement on the right and no significant change on the left. Stable laterally loculated pleural fluid on the left. Mild dextroconvex thoracic scoliosis. IMPRESSION: 1. Bilateral pneumonia with mild improvement on the right and no significant change on the left. 2. Stable laterally  loculated pleural fluid on the left. 3. Poor inspiration. Electronically Signed   By: Beckie Salts M.D.   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 EXAM: PORTABLE CHEST 1 VIEW COMPARISON:  08/16/2023 FINDINGS: Stable heart size and mediastinal contours. No significant reaccumulation of left pleural fluid after thoracentesis 3 days ago. No right-sided pleural fluid. Multifocal airspace disease again noted in both lungs, especially in both upper lobes which appears more prominent compared to the prior study and may reflect acute pneumonia. IMPRESSION: 1. No significant reaccumulation of left pleural fluid after thoracentesis 3 days ago. 2. Multifocal airspace disease in both lungs, especially in both upper lobes which appears more prominent compared to the prior study and may reflect acute pneumonia. Electronically Signed   By: Irish Lack M.D.   On: 08/09/2023 08:31   DG Chest Port 1 View  Result Date: 08/22/2023 CLINICAL DATA:  Shortness of breath. History of lung cancer. Status post thoracentesis. EXAM: PORTABLE CHEST 1 VIEW COMPARISON:  07/28/2023 FINDINGS: Stable cardiomediastinal contours. Interval decreased volume of left pleural effusion status post thoracentesis. No pneumothorax identified. Diffuse interstitial and scattered patchy airspace opacities are again noted and appear unchanged from previous exam. Visualized osseous structures are unremarkable. IMPRESSION: 1. Interval decreased volume of left pleural effusion status post thoracentesis. No pneumothorax identified. 2. Stable diffuse interstitial and scattered patchy airspace opacities. Electronically Signed   By: Signa Kell M.D.   On: 08/14/2023 18:09   IR THORACENTESIS ASP PLEURAL SPACE W/IMG GUIDE  Result Date:  08/09/2023 INDICATION: Patient with history of left breast cancer, right leg melanoma, recurrent malignant, symptomatic left pleural effusion; request received for therapeutic left thoracentesis EXAM: ULTRASOUND GUIDED THERAPEUTIC LEFT THORACENTESIS MEDICATIONS: 8 ml 1% lidocaine COMPLICATIONS: None immediate. PROCEDURE: An ultrasound guided thoracentesis was thoroughly discussed with the patient and questions answered. The benefits, risks, alternatives and complications were also discussed. The patient understands and wishes to proceed with the procedure. Written consent was obtained. Ultrasound was performed to localize and mark an adequate pocket of fluid in the left chest. The area was then prepped and draped in the normal sterile fashion. 1% Lidocaine was used for local anesthesia. Under ultrasound guidance a 6 Fr Safe-T-Centesis catheter was introduced. Thoracentesis was performed. The catheter was removed and a dressing applied. FINDINGS: A total of approximately 710 cc of hazy,amber fluid was removed. IMPRESSION: Successful ultrasound guided therapeutic left thoracentesis yielding 710 cc of pleural fluid. Performed by: Artemio Aly Electronically Signed   By: Marliss Coots M.D.   On: 08/13/2023 16:49   DG Chest Portable 1 View  Result Date: 08/25/2023 CLINICAL DATA:  Shortness of breath. EXAM: PORTABLE CHEST 1 VIEW COMPARISON:  Chest radiograph 08/04/2023 FINDINGS: The cardiomediastinal silhouette is unchanged with normal heart size. Lung volumes remain low. Interstitial and airspace opacities throughout both lungs have not significantly changed. A moderate left pleural effusion may have mildly enlarged. No pneumothorax is identified. IMPRESSION: 1. Unchanged bilateral airspace disease. 2. Possible mild enlargement of the left pleural effusion. Electronically Signed   By: Sebastian Ache M.D.   On: 08/18/2023 15:35   DG Chest Port 1 View  Result Date: 08/04/2023 CLINICAL DATA:  Bronchoscopy with  biopsy EXAM: PORTABLE CHEST 1 VIEW COMPARISON:  08/02/2023, 07/29/2023, 07/18/2023 FINDINGS: Low lung volumes. 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 She underwent left thoracentesis 9/13 in IR yielding 710 mL pleural fluid with reported improvement in symptoms. She was planned for outpatient PleurX with [facility] and has been evaluated daily by their service for possible placement however there has been insufficient fluid so far with concern for possible loculations. IR has been consulted for possible PleurX placement.  Patient history and imaging reviewed by Dr. Elby Showers who notes loculations on CT however does not feel that the loculations are significant enough to prevent placement, he has approved procedure.  On  exam today Ms. Fonseca is requiring HHFNC at 50 L/min which is new in the last 24 hours. She reports improvement in dyspnea, spO2 remains ~92%. Given her new very high oxygen requirement I am unsure that we will be able to proceed with moderate sedation in IR, her oxygen requirement and clinical status will need to be reviewed by the IR team on Monday 9/23 to determine if/how we can safely proceed. I discussed this with Ms. Side who states understanding.   Will make NPO at midnight on 9/23 in case we are able to proceed with left PleurX placement.  Risks and benefits discussed with the patient including bleeding, infection, damage to adjacent structures, malfunction of the catheter with need for additional procedures.  All of the patient's questions were answered, patient is agreeable to proceed.  Consent signed and in chart.  Thank you for this interesting consult.  I greatly enjoyed meeting Moreen Cranmer Paschall Theil and look forward to participating in their care.  A copy of this report was sent to the requesting provider on this date.  Electronically Signed: Villa Herb, PA-C 08/17/2023, 1:06 PM   I spent a total of 55 Miinutes in face to face in clinical consultation, greater than 50% of which was counseling/coordinating care for left pleural effusion.  is clear. No oropharyngeal exudate or posterior oropharyngeal erythema.  Cardiovascular:     Rate and Rhythm: Normal rate and regular rhythm.  Pulmonary:     Comments: HHFNC in place Coarse breath sounds LLL Skin:    General: Skin is warm and dry.  Neurological:     Mental Status: She is alert and oriented to person, place, and time.  Psychiatric:        Mood and Affect: Mood normal.        Behavior: Behavior normal.        Thought Content: Thought content normal.        Judgment: Judgment normal.      MD Evaluation Airway: WNL Heart: WNL Abdomen: WNL Chest/ Lungs:  (HHFNC) ASA  Classification: 3 Mallampati/Airway Score: Two   Imaging: DG Chest Port 1 View  Result Date: 08/17/2023 CLINICAL DATA:  Respiratory distress EXAM: PORTABLE CHEST 1 VIEW COMPARISON:  08/13/2023 FINDINGS: Lung volumes are small. Stable asymmetric left-sided volume loss. Left pleural thickening or laterally loculated left pleural effusion is unchanged. Extensive asymmetric multifocal pulmonary infiltrate is again seen related to changes of probable lymphangitic spread of malignancy better appreciated on prior CT examination cardiac size within normal limits. No acute bone abnormality. IMPRESSION: 1. No significant interval change. Extensive asymmetric multifocal pulmonary infiltrate related to changes of probable lymphangitic spread of malignancy better appreciated on prior CT examination. Electronically Signed   By: Helyn Numbers M.D.   On: 08/17/2023 02:03   CT CHEST W CONTRAST  Result Date: 08/16/2023 CLINICAL DATA:  Left non-small-cell lung carcinoma. Left pleural effusion. Staging. * Tracking Code: BO * EXAM: CT CHEST WITH CONTRAST TECHNIQUE: Multidetector CT imaging of the chest was performed during intravenous contrast administration. RADIATION DOSE REDUCTION: This exam was performed according to the departmental dose-optimization program which includes automated exposure control, adjustment  of the mA and/or kV according to patient size and/or use of iterative reconstruction technique. CONTRAST:  75mL OMNIPAQUE IOHEXOL 300 MG/ML  SOLN COMPARISON:  07/29/2023 FINDINGS: Cardiovascular:  No acute findings. Mediastinum/Nodes: Mediastinal lymphadenopathy in the right paratracheal and subcarinal regions shows no significant change, with largest area in the right paratracheal region measuring 3.5 x 2.0 cm on image 46/2. Mild bilateral hilar lymphadenopathy also without significant change. Lungs/Pleura: Moderate multiloculated left pleural effusion shows mild decrease in size since previous study. Diffuse pulmonary interstitial thickening and heterogeneous airspace disease is again seen bilaterally, greatest in the lower lobes, also without significant change. Upper Abdomen:  Unremarkable. Musculoskeletal: New small left anterior chest wall mass seen in the left parasternal region, measuring 2.0 x 1.9 cm on image 60/2. No suspicious bone lesions. IMPRESSION: Stable mediastinal and bilateral hilar lymphadenopathy. No significant change in diffuse interstitial infiltrates and heterogeneous airspace disease, greatest in the lower lobes. New 2 cm left parasternal chest wall mass, suspicious for metastatic disease. Mild decrease in size of moderate multiloculated left pleural effusion. Electronically Signed   By: Danae Orleans M.D.   On: 08/16/2023 15:02   DG Chest Port 1 View  Result Date: 08/13/2023 CLINICAL DATA:  Respiratory failure. EXAM: PORTABLE CHEST 1 VIEW COMPARISON:  08/14/2023.  Chest CT dated 07/29/2023. FINDINGS: Poor inspiration. Stable normal sized heart. Patchy airspace opacities throughout both lungs with mild improvement on the right and no significant change on the left. Stable laterally loculated pleural fluid on the left. Mild dextroconvex thoracic scoliosis. IMPRESSION: 1. Bilateral pneumonia with mild improvement on the right and no significant change on the left. 2. Stable laterally  loculated pleural fluid on the left. 3. Poor inspiration. Electronically Signed   By: Beckie Salts M.D.   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 EXAM: PORTABLE CHEST 1 VIEW COMPARISON:  08/16/2023 FINDINGS: Stable heart size and mediastinal contours. No significant reaccumulation of left pleural fluid after thoracentesis 3 days ago. No right-sided pleural fluid. Multifocal airspace disease again noted in both lungs, especially in both upper lobes which appears more prominent compared to the prior study and may reflect acute pneumonia. IMPRESSION: 1. No significant reaccumulation of left pleural fluid after thoracentesis 3 days ago. 2. Multifocal airspace disease in both lungs, especially in both upper lobes which appears more prominent compared to the prior study and may reflect acute pneumonia. Electronically Signed   By: Irish Lack M.D.   On: 08/09/2023 08:31   DG Chest Port 1 View  Result Date: 08/22/2023 CLINICAL DATA:  Shortness of breath. History of lung cancer. Status post thoracentesis. EXAM: PORTABLE CHEST 1 VIEW COMPARISON:  07/28/2023 FINDINGS: Stable cardiomediastinal contours. Interval decreased volume of left pleural effusion status post thoracentesis. No pneumothorax identified. Diffuse interstitial and scattered patchy airspace opacities are again noted and appear unchanged from previous exam. Visualized osseous structures are unremarkable. IMPRESSION: 1. Interval decreased volume of left pleural effusion status post thoracentesis. No pneumothorax identified. 2. Stable diffuse interstitial and scattered patchy airspace opacities. Electronically Signed   By: Signa Kell M.D.   On: 08/14/2023 18:09   IR THORACENTESIS ASP PLEURAL SPACE W/IMG GUIDE  Result Date:  08/09/2023 INDICATION: Patient with history of left breast cancer, right leg melanoma, recurrent malignant, symptomatic left pleural effusion; request received for therapeutic left thoracentesis EXAM: ULTRASOUND GUIDED THERAPEUTIC LEFT THORACENTESIS MEDICATIONS: 8 ml 1% lidocaine COMPLICATIONS: None immediate. PROCEDURE: An ultrasound guided thoracentesis was thoroughly discussed with the patient and questions answered. The benefits, risks, alternatives and complications were also discussed. The patient understands and wishes to proceed with the procedure. Written consent was obtained. Ultrasound was performed to localize and mark an adequate pocket of fluid in the left chest. The area was then prepped and draped in the normal sterile fashion. 1% Lidocaine was used for local anesthesia. Under ultrasound guidance a 6 Fr Safe-T-Centesis catheter was introduced. Thoracentesis was performed. The catheter was removed and a dressing applied. FINDINGS: A total of approximately 710 cc of hazy,amber fluid was removed. IMPRESSION: Successful ultrasound guided therapeutic left thoracentesis yielding 710 cc of pleural fluid. Performed by: Artemio Aly Electronically Signed   By: Marliss Coots M.D.   On: 08/13/2023 16:49   DG Chest Portable 1 View  Result Date: 08/25/2023 CLINICAL DATA:  Shortness of breath. EXAM: PORTABLE CHEST 1 VIEW COMPARISON:  Chest radiograph 08/04/2023 FINDINGS: The cardiomediastinal silhouette is unchanged with normal heart size. Lung volumes remain low. Interstitial and airspace opacities throughout both lungs have not significantly changed. A moderate left pleural effusion may have mildly enlarged. No pneumothorax is identified. IMPRESSION: 1. Unchanged bilateral airspace disease. 2. Possible mild enlargement of the left pleural effusion. Electronically Signed   By: Sebastian Ache M.D.   On: 08/18/2023 15:35   DG Chest Port 1 View  Result Date: 08/04/2023 CLINICAL DATA:  Bronchoscopy with  biopsy EXAM: PORTABLE CHEST 1 VIEW COMPARISON:  08/02/2023, 07/29/2023, 07/18/2023 FINDINGS: Low lung volumes. 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 She underwent left thoracentesis 9/13 in IR yielding 710 mL pleural fluid with reported improvement in symptoms. She was planned for outpatient PleurX with PCCM and has been evaluated daily by their service for possible placement however there has been insufficient fluid so far with concern for possible loculations. IR has been consulted for possible PleurX placement.  Patient history and imaging reviewed by Dr. Elby Showers who notes loculations on CT however does not feel that the loculations are significant enough to prevent placement, he has approved procedure.  On  exam today Ms. Fonseca is requiring HHFNC at 50 L/min which is new in the last 24 hours. She reports improvement in dyspnea, spO2 remains ~92%. Given her new very high oxygen requirement I am unsure that we will be able to proceed with moderate sedation in IR, her oxygen requirement and clinical status will need to be reviewed by the IR team on Monday 9/23 to determine if/how we can safely proceed. I discussed this with Ms. Side who states understanding.   Will make NPO at midnight on 9/23 in case we are able to proceed with left PleurX placement.  Risks and benefits discussed with the patient including bleeding, infection, damage to adjacent structures, malfunction of the catheter with need for additional procedures.  All of the patient's questions were answered, patient is agreeable to proceed.  Consent signed and in chart.  Thank you for this interesting consult.  I greatly enjoyed meeting Moreen Cranmer Paschall Theil and look forward to participating in their care.  A copy of this report was sent to the requesting provider on this date.  Electronically Signed: Villa Herb, PA-C 08/17/2023, 1:06 PM   I spent a total of 55 Miinutes in face to face in clinical consultation, greater than 50% of which was counseling/coordinating care for left pleural effusion.  loculated pleural fluid on the left. 3. Poor inspiration. Electronically Signed   By: Beckie Salts M.D.   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 EXAM: PORTABLE CHEST 1 VIEW COMPARISON:  08/16/2023 FINDINGS: Stable heart size and mediastinal contours. No significant reaccumulation of left pleural fluid after thoracentesis 3 days ago. No right-sided pleural fluid. Multifocal airspace disease again noted in both lungs, especially in both upper lobes which appears more prominent compared to the prior study and may reflect acute pneumonia. IMPRESSION: 1. No significant reaccumulation of left pleural fluid after thoracentesis 3 days ago. 2. Multifocal airspace disease in both lungs, especially in both upper lobes which appears more prominent compared to the prior study and may reflect acute pneumonia. Electronically Signed   By: Irish Lack M.D.   On: 08/09/2023 08:31   DG Chest Port 1 View  Result Date: 08/22/2023 CLINICAL DATA:  Shortness of breath. History of lung cancer. Status post thoracentesis. EXAM: PORTABLE CHEST 1 VIEW COMPARISON:  07/28/2023 FINDINGS: Stable cardiomediastinal contours. Interval decreased volume of left pleural effusion status post thoracentesis. No pneumothorax identified. Diffuse interstitial and scattered patchy airspace opacities are again noted and appear unchanged from previous exam. Visualized osseous structures are unremarkable. IMPRESSION: 1. Interval decreased volume of left pleural effusion status post thoracentesis. No pneumothorax identified. 2. Stable diffuse interstitial and scattered patchy airspace opacities. Electronically Signed   By: Signa Kell M.D.   On: 08/14/2023 18:09   IR THORACENTESIS ASP PLEURAL SPACE W/IMG GUIDE  Result Date:  08/09/2023 INDICATION: Patient with history of left breast cancer, right leg melanoma, recurrent malignant, symptomatic left pleural effusion; request received for therapeutic left thoracentesis EXAM: ULTRASOUND GUIDED THERAPEUTIC LEFT THORACENTESIS MEDICATIONS: 8 ml 1% lidocaine COMPLICATIONS: None immediate. PROCEDURE: An ultrasound guided thoracentesis was thoroughly discussed with the patient and questions answered. The benefits, risks, alternatives and complications were also discussed. The patient understands and wishes to proceed with the procedure. Written consent was obtained. Ultrasound was performed to localize and mark an adequate pocket of fluid in the left chest. The area was then prepped and draped in the normal sterile fashion. 1% Lidocaine was used for local anesthesia. Under ultrasound guidance a 6 Fr Safe-T-Centesis catheter was introduced. Thoracentesis was performed. The catheter was removed and a dressing applied. FINDINGS: A total of approximately 710 cc of hazy,amber fluid was removed. IMPRESSION: Successful ultrasound guided therapeutic left thoracentesis yielding 710 cc of pleural fluid. Performed by: Artemio Aly Electronically Signed   By: Marliss Coots M.D.   On: 08/13/2023 16:49   DG Chest Portable 1 View  Result Date: 08/25/2023 CLINICAL DATA:  Shortness of breath. EXAM: PORTABLE CHEST 1 VIEW COMPARISON:  Chest radiograph 08/04/2023 FINDINGS: The cardiomediastinal silhouette is unchanged with normal heart size. Lung volumes remain low. Interstitial and airspace opacities throughout both lungs have not significantly changed. A moderate left pleural effusion may have mildly enlarged. No pneumothorax is identified. IMPRESSION: 1. Unchanged bilateral airspace disease. 2. Possible mild enlargement of the left pleural effusion. Electronically Signed   By: Sebastian Ache M.D.   On: 08/18/2023 15:35   DG Chest Port 1 View  Result Date: 08/04/2023 CLINICAL DATA:  Bronchoscopy with  biopsy EXAM: PORTABLE CHEST 1 VIEW COMPARISON:  08/02/2023, 07/29/2023, 07/18/2023 FINDINGS: Low lung volumes. Heterogeneous bilateral interstitial and alveolar disease, slightly worsened compared  is clear. No oropharyngeal exudate or posterior oropharyngeal erythema.  Cardiovascular:     Rate and Rhythm: Normal rate and regular rhythm.  Pulmonary:     Comments: HHFNC in place Coarse breath sounds LLL Skin:    General: Skin is warm and dry.  Neurological:     Mental Status: She is alert and oriented to person, place, and time.  Psychiatric:        Mood and Affect: Mood normal.        Behavior: Behavior normal.        Thought Content: Thought content normal.        Judgment: Judgment normal.      MD Evaluation Airway: WNL Heart: WNL Abdomen: WNL Chest/ Lungs:  (HHFNC) ASA  Classification: 3 Mallampati/Airway Score: Two   Imaging: DG Chest Port 1 View  Result Date: 08/17/2023 CLINICAL DATA:  Respiratory distress EXAM: PORTABLE CHEST 1 VIEW COMPARISON:  08/13/2023 FINDINGS: Lung volumes are small. Stable asymmetric left-sided volume loss. Left pleural thickening or laterally loculated left pleural effusion is unchanged. Extensive asymmetric multifocal pulmonary infiltrate is again seen related to changes of probable lymphangitic spread of malignancy better appreciated on prior CT examination cardiac size within normal limits. No acute bone abnormality. IMPRESSION: 1. No significant interval change. Extensive asymmetric multifocal pulmonary infiltrate related to changes of probable lymphangitic spread of malignancy better appreciated on prior CT examination. Electronically Signed   By: Helyn Numbers M.D.   On: 08/17/2023 02:03   CT CHEST W CONTRAST  Result Date: 08/16/2023 CLINICAL DATA:  Left non-small-cell lung carcinoma. Left pleural effusion. Staging. * Tracking Code: BO * EXAM: CT CHEST WITH CONTRAST TECHNIQUE: Multidetector CT imaging of the chest was performed during intravenous contrast administration. RADIATION DOSE REDUCTION: This exam was performed according to the departmental dose-optimization program which includes automated exposure control, adjustment  of the mA and/or kV according to patient size and/or use of iterative reconstruction technique. CONTRAST:  75mL OMNIPAQUE IOHEXOL 300 MG/ML  SOLN COMPARISON:  07/29/2023 FINDINGS: Cardiovascular:  No acute findings. Mediastinum/Nodes: Mediastinal lymphadenopathy in the right paratracheal and subcarinal regions shows no significant change, with largest area in the right paratracheal region measuring 3.5 x 2.0 cm on image 46/2. Mild bilateral hilar lymphadenopathy also without significant change. Lungs/Pleura: Moderate multiloculated left pleural effusion shows mild decrease in size since previous study. Diffuse pulmonary interstitial thickening and heterogeneous airspace disease is again seen bilaterally, greatest in the lower lobes, also without significant change. Upper Abdomen:  Unremarkable. Musculoskeletal: New small left anterior chest wall mass seen in the left parasternal region, measuring 2.0 x 1.9 cm on image 60/2. No suspicious bone lesions. IMPRESSION: Stable mediastinal and bilateral hilar lymphadenopathy. No significant change in diffuse interstitial infiltrates and heterogeneous airspace disease, greatest in the lower lobes. New 2 cm left parasternal chest wall mass, suspicious for metastatic disease. Mild decrease in size of moderate multiloculated left pleural effusion. Electronically Signed   By: Danae Orleans M.D.   On: 08/16/2023 15:02   DG Chest Port 1 View  Result Date: 08/13/2023 CLINICAL DATA:  Respiratory failure. EXAM: PORTABLE CHEST 1 VIEW COMPARISON:  08/14/2023.  Chest CT dated 07/29/2023. FINDINGS: Poor inspiration. Stable normal sized heart. Patchy airspace opacities throughout both lungs with mild improvement on the right and no significant change on the left. Stable laterally loculated pleural fluid on the left. Mild dextroconvex thoracic scoliosis. IMPRESSION: 1. Bilateral pneumonia with mild improvement on the right and no significant change on the left. 2. Stable laterally  is clear. No oropharyngeal exudate or posterior oropharyngeal erythema.  Cardiovascular:     Rate and Rhythm: Normal rate and regular rhythm.  Pulmonary:     Comments: HHFNC in place Coarse breath sounds LLL Skin:    General: Skin is warm and dry.  Neurological:     Mental Status: She is alert and oriented to person, place, and time.  Psychiatric:        Mood and Affect: Mood normal.        Behavior: Behavior normal.        Thought Content: Thought content normal.        Judgment: Judgment normal.      MD Evaluation Airway: WNL Heart: WNL Abdomen: WNL Chest/ Lungs:  (HHFNC) ASA  Classification: 3 Mallampati/Airway Score: Two   Imaging: DG Chest Port 1 View  Result Date: 08/17/2023 CLINICAL DATA:  Respiratory distress EXAM: PORTABLE CHEST 1 VIEW COMPARISON:  08/13/2023 FINDINGS: Lung volumes are small. Stable asymmetric left-sided volume loss. Left pleural thickening or laterally loculated left pleural effusion is unchanged. Extensive asymmetric multifocal pulmonary infiltrate is again seen related to changes of probable lymphangitic spread of malignancy better appreciated on prior CT examination cardiac size within normal limits. No acute bone abnormality. IMPRESSION: 1. No significant interval change. Extensive asymmetric multifocal pulmonary infiltrate related to changes of probable lymphangitic spread of malignancy better appreciated on prior CT examination. Electronically Signed   By: Helyn Numbers M.D.   On: 08/17/2023 02:03   CT CHEST W CONTRAST  Result Date: 08/16/2023 CLINICAL DATA:  Left non-small-cell lung carcinoma. Left pleural effusion. Staging. * Tracking Code: BO * EXAM: CT CHEST WITH CONTRAST TECHNIQUE: Multidetector CT imaging of the chest was performed during intravenous contrast administration. RADIATION DOSE REDUCTION: This exam was performed according to the departmental dose-optimization program which includes automated exposure control, adjustment  of the mA and/or kV according to patient size and/or use of iterative reconstruction technique. CONTRAST:  75mL OMNIPAQUE IOHEXOL 300 MG/ML  SOLN COMPARISON:  07/29/2023 FINDINGS: Cardiovascular:  No acute findings. Mediastinum/Nodes: Mediastinal lymphadenopathy in the right paratracheal and subcarinal regions shows no significant change, with largest area in the right paratracheal region measuring 3.5 x 2.0 cm on image 46/2. Mild bilateral hilar lymphadenopathy also without significant change. Lungs/Pleura: Moderate multiloculated left pleural effusion shows mild decrease in size since previous study. Diffuse pulmonary interstitial thickening and heterogeneous airspace disease is again seen bilaterally, greatest in the lower lobes, also without significant change. Upper Abdomen:  Unremarkable. Musculoskeletal: New small left anterior chest wall mass seen in the left parasternal region, measuring 2.0 x 1.9 cm on image 60/2. No suspicious bone lesions. IMPRESSION: Stable mediastinal and bilateral hilar lymphadenopathy. No significant change in diffuse interstitial infiltrates and heterogeneous airspace disease, greatest in the lower lobes. New 2 cm left parasternal chest wall mass, suspicious for metastatic disease. Mild decrease in size of moderate multiloculated left pleural effusion. Electronically Signed   By: Danae Orleans M.D.   On: 08/16/2023 15:02   DG Chest Port 1 View  Result Date: 08/13/2023 CLINICAL DATA:  Respiratory failure. EXAM: PORTABLE CHEST 1 VIEW COMPARISON:  08/14/2023.  Chest CT dated 07/29/2023. FINDINGS: Poor inspiration. Stable normal sized heart. Patchy airspace opacities throughout both lungs with mild improvement on the right and no significant change on the left. Stable laterally loculated pleural fluid on the left. Mild dextroconvex thoracic scoliosis. IMPRESSION: 1. Bilateral pneumonia with mild improvement on the right and no significant change on the left. 2. Stable laterally  loculated pleural fluid on the left. 3. Poor inspiration. Electronically Signed   By: Beckie Salts M.D.   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 EXAM: PORTABLE CHEST 1 VIEW COMPARISON:  08/16/2023 FINDINGS: Stable heart size and mediastinal contours. No significant reaccumulation of left pleural fluid after thoracentesis 3 days ago. No right-sided pleural fluid. Multifocal airspace disease again noted in both lungs, especially in both upper lobes which appears more prominent compared to the prior study and may reflect acute pneumonia. IMPRESSION: 1. No significant reaccumulation of left pleural fluid after thoracentesis 3 days ago. 2. Multifocal airspace disease in both lungs, especially in both upper lobes which appears more prominent compared to the prior study and may reflect acute pneumonia. Electronically Signed   By: Irish Lack M.D.   On: 08/09/2023 08:31   DG Chest Port 1 View  Result Date: 08/22/2023 CLINICAL DATA:  Shortness of breath. History of lung cancer. Status post thoracentesis. EXAM: PORTABLE CHEST 1 VIEW COMPARISON:  07/28/2023 FINDINGS: Stable cardiomediastinal contours. Interval decreased volume of left pleural effusion status post thoracentesis. No pneumothorax identified. Diffuse interstitial and scattered patchy airspace opacities are again noted and appear unchanged from previous exam. Visualized osseous structures are unremarkable. IMPRESSION: 1. Interval decreased volume of left pleural effusion status post thoracentesis. No pneumothorax identified. 2. Stable diffuse interstitial and scattered patchy airspace opacities. Electronically Signed   By: Signa Kell M.D.   On: 08/14/2023 18:09   IR THORACENTESIS ASP PLEURAL SPACE W/IMG GUIDE  Result Date:  08/09/2023 INDICATION: Patient with history of left breast cancer, right leg melanoma, recurrent malignant, symptomatic left pleural effusion; request received for therapeutic left thoracentesis EXAM: ULTRASOUND GUIDED THERAPEUTIC LEFT THORACENTESIS MEDICATIONS: 8 ml 1% lidocaine COMPLICATIONS: None immediate. PROCEDURE: An ultrasound guided thoracentesis was thoroughly discussed with the patient and questions answered. The benefits, risks, alternatives and complications were also discussed. The patient understands and wishes to proceed with the procedure. Written consent was obtained. Ultrasound was performed to localize and mark an adequate pocket of fluid in the left chest. The area was then prepped and draped in the normal sterile fashion. 1% Lidocaine was used for local anesthesia. Under ultrasound guidance a 6 Fr Safe-T-Centesis catheter was introduced. Thoracentesis was performed. The catheter was removed and a dressing applied. FINDINGS: A total of approximately 710 cc of hazy,amber fluid was removed. IMPRESSION: Successful ultrasound guided therapeutic left thoracentesis yielding 710 cc of pleural fluid. Performed by: Artemio Aly Electronically Signed   By: Marliss Coots M.D.   On: 08/13/2023 16:49   DG Chest Portable 1 View  Result Date: 08/25/2023 CLINICAL DATA:  Shortness of breath. EXAM: PORTABLE CHEST 1 VIEW COMPARISON:  Chest radiograph 08/04/2023 FINDINGS: The cardiomediastinal silhouette is unchanged with normal heart size. Lung volumes remain low. Interstitial and airspace opacities throughout both lungs have not significantly changed. A moderate left pleural effusion may have mildly enlarged. No pneumothorax is identified. IMPRESSION: 1. Unchanged bilateral airspace disease. 2. Possible mild enlargement of the left pleural effusion. Electronically Signed   By: Sebastian Ache M.D.   On: 08/18/2023 15:35   DG Chest Port 1 View  Result Date: 08/04/2023 CLINICAL DATA:  Bronchoscopy with  biopsy EXAM: PORTABLE CHEST 1 VIEW COMPARISON:  08/02/2023, 07/29/2023, 07/18/2023 FINDINGS: Low lung volumes. Heterogeneous bilateral interstitial and alveolar disease, slightly worsened compared

## 2023-08-17 NOTE — Progress Notes (Signed)
   08/17/23 2052  BiPAP/CPAP/SIPAP  BiPAP/CPAP/SIPAP Pt Type Adult (pt on HHFNC)  Reason BIPAP/CPAP not in use Non-compliant  BiPAP/CPAP /SiPAP Vitals  Pulse Rate 95  Resp 19  SpO2 94 %  MEWS Score/Color  MEWS Score 0  MEWS Score Color Sherri Schultz

## 2023-08-17 NOTE — Progress Notes (Signed)
eLink Physician-Brief Progress Note Patient Name: Sherri Schultz DOB: [birth year] MRN: [id number]   Date of Service  08/17/2023  HPI/Events of Note  57 year old female that is in the ICU for acute on chronic hypoxemic respiratory failure in the setting of recurrent left-sided effusion from malignant underlying breast cancer.  Currently complaining of 7 out of 10 neck/back pain.  Having dysphagia with oral oxycodone.  Has ongoing anxiety but only has oral Atarax ordered.  eICU Interventions  Increase morphine dosing to be more consistent with as needed oxycodone dosing.  Hold p.o.'s.  Change hydroxyzine to IM/PO as tolerated.     Intervention Category Intermediate Interventions: Pain - evaluation and management  Sherri Schultz 08/17/2023, 9:04 PM

## 2023-08-18 ENCOUNTER — Inpatient Hospital Stay (HOSPITAL_COMMUNITY): Payer: BC Managed Care – PPO

## 2023-08-18 ENCOUNTER — Inpatient Hospital Stay: Payer: BC Managed Care – PPO | Admitting: Hematology and Oncology

## 2023-08-18 DIAGNOSIS — C50919 Malignant neoplasm of unspecified site of unspecified female breast: Secondary | ICD-10-CM | POA: Diagnosis not present

## 2023-08-18 DIAGNOSIS — R0603 Acute respiratory distress: Secondary | ICD-10-CM | POA: Diagnosis not present

## 2023-08-18 DIAGNOSIS — C799 Secondary malignant neoplasm of unspecified site: Secondary | ICD-10-CM | POA: Diagnosis not present

## 2023-08-18 DIAGNOSIS — J90 Pleural effusion, not elsewhere classified: Secondary | ICD-10-CM | POA: Diagnosis not present

## 2023-08-18 DIAGNOSIS — M7989 Other specified soft tissue disorders: Secondary | ICD-10-CM | POA: Diagnosis not present

## 2023-08-18 DIAGNOSIS — J9601 Acute respiratory failure with hypoxia: Secondary | ICD-10-CM | POA: Diagnosis not present

## 2023-08-18 LAB — BASIC METABOLIC PANEL
Anion gap: 16 — ABNORMAL HIGH (ref 5–15)
BUN: 33 mg/dL — ABNORMAL HIGH (ref 6–20)
CO2: 32 mmol/L (ref 22–32)
Calcium: 9.3 mg/dL (ref 8.9–10.3)
Chloride: 86 mmol/L — ABNORMAL LOW (ref 98–111)
Creatinine, Ser: 0.91 mg/dL (ref 0.44–1.00)
GFR, Estimated: >60 mL/min (ref >60)
Glucose, Bld: 122 mg/dL — ABNORMAL HIGH (ref 70–99)
Potassium: 3.4 mmol/L — ABNORMAL LOW (ref 3.5–5.1)
Sodium: 134 mmol/L — ABNORMAL LOW (ref 135–145)

## 2023-08-18 LAB — CBC
HCT: 41.4 % (ref 36.0–46.0)
Hemoglobin: 13 g/dL (ref 12.0–15.0)
MCH: 29.7 pg (ref 26.0–34.0)
MCHC: 31.4 g/dL (ref 30.0–36.0)
MCV: 94.5 fL (ref 80.0–100.0)
Platelets: 192 10*3/uL (ref 150–400)
RBC: 4.38 MIL/uL (ref 3.87–5.11)
RDW: 12.4 % (ref 11.5–15.5)
WBC: 15.8 10*3/uL — ABNORMAL HIGH (ref 4.0–10.5)
nRBC: 0.1 % (ref 0.0–0.2)

## 2023-08-18 LAB — FUNGUS CULTURE RESULT

## 2023-08-18 LAB — FUNGAL ORGANISM REFLEX

## 2023-08-18 LAB — PROCALCITONIN: Procalcitonin: 0.15 ng/mL

## 2023-08-18 LAB — FUNGUS CULTURE WITH STAIN

## 2023-08-18 LAB — PROTIME-INR
INR: 1.2 (ref 0.8–1.2)
Prothrombin Time: 15.5 seconds — ABNORMAL HIGH (ref 11.4–15.2)

## 2023-08-18 MED ORDER — POTASSIUM CHLORIDE 10 MEQ/100ML IV SOLN
10.0000 meq | INTRAVENOUS | Status: AC
  Administered 2023-08-18 (×2): 10 meq via INTRAVENOUS
  Filled 2023-08-18 (×2): qty 100

## 2023-08-18 MED ORDER — LIDOCAINE HCL 1 % IJ SOLN
INTRAMUSCULAR | Status: AC
  Filled 2023-08-18: qty 20

## 2023-08-18 MED ORDER — POTASSIUM CHLORIDE CRYS ER 20 MEQ PO TBCR: 20.0000 meq | EXTENDED_RELEASE_TABLET | Freq: Every day | ORAL | Status: DC

## 2023-08-18 NOTE — Progress Notes (Signed)
Physical Therapy Treatment Patient Details Name: Sherri Schultz MRN: [id number] DOB: 1966-10-15 Today's Date: 08/18/2023   History of Present Illness Pt admitted from home with SOB 2* acute on chronic respiratory failure with hypoxia and recurrent malignant pleural effusion.  PT with hx of Breast CA with mets to lung and C-spine    PT Comments  Pt seen in step down unit RM # [id number] Assisted OOB to recliner only due to high oxygen demand and dyspnea.    If plan is discharge home, recommend the following: A little help with walking and/or transfers;A little help with bathing/dressing/bathroom;Assistance with cooking/housework;Assist for transportation;Help with stairs or ramp for entrance   Can travel by private vehicle        Equipment Recommendations  Rolling walker (2 wheels)    Recommendations for Other Services       Precautions / Restrictions Precautions Precautions: Fall Precaution Comments: currently on HHFNC Restrictions Weight Bearing Restrictions: No     Mobility  Bed Mobility Overal bed mobility: Needs Assistance Bed Mobility: Supine to Sit     Supine to sit: Supervision     General bed mobility comments: pt physically able at Supervision for multiple lines/HHFNC    Transfers Overall transfer level: Needs assistance Equipment used: None Transfers: Sit to/from Stand Sit to Stand: Supervision           General transfer comment: pt physically self able to stand and take a few steps to recliner.    Ambulation/Gait               General Gait Details: unable to amb due to high oxygen demand   Stairs             Wheelchair Mobility     Tilt Bed    Modified Rankin (Stroke Patients Only)       Balance                                            Cognition Arousal: Alert Behavior During Therapy: WFL for tasks assessed/performed Overall Cognitive Status: Within Functional Limits for tasks assessed                                  General Comments: AxO x 3 pleasant and willing        Exercises      General Comments        Pertinent Vitals/Pain Pain Assessment Pain Assessment: Faces Faces Pain Scale: Hurts a little bit Pain Location: back and neck Pain Descriptors / Indicators: Grimacing, Constant Pain Intervention(s): Monitored during session, Repositioned    Home Living                          Prior Function            PT Goals (current goals can now be found in the care plan section) Progress towards PT goals: Progressing toward goals    Frequency    Min 1X/week      PT Plan      Co-evaluation              AM-PAC PT "6 Clicks" Mobility   Outcome Measure  Help needed turning from your back to your side while in a flat bed without using bedrails?:  None Help needed moving from lying on your back to sitting on the side of a flat bed without using bedrails?: None Help needed moving to and from a bed to a chair (including a wheelchair)?: None Help needed standing up from a chair using your arms (e.g., wheelchair or bedside chair)?: None Help needed to walk in hospital room?: A Little Help needed climbing 3-5 steps with a railing? : A Little 6 Click Score: 22    End of Session Equipment Utilized During Treatment: Gait belt;Oxygen Activity Tolerance: Patient tolerated treatment well;Patient limited by fatigue Patient left: in chair;with call bell/phone within reach;with family/visitor present Nurse Communication: Mobility status PT Visit Diagnosis: Difficulty in walking, not elsewhere classified (R26.2);Muscle weakness (generalized) (M62.81)     Time: 1478-2956 PT Time Calculation (min) (ACUTE ONLY): 15 min  Charges:    $Therapeutic Activity: 8-22 mins PT General Charges $$ ACUTE PT VISIT: 1 Visit                     Felecia Shelling  PTA Acute  Rehabilitation Services Office M-F          [phone]

## 2023-08-18 NOTE — Progress Notes (Signed)
At 1112 RT removed PT from heated high flow 02 system and placed on 15 LPM [place] (high flow not heated) and 15 LPM NRB (PT is planning to travel to IR for procedure). RN aware and agrees to monitor PT while RT figures out the safest way to travel and complete procedure. At 1123 PT Sp02 91%, HR 104, RR 21.  RT is leaving room to go to IR and plan process. RN aware.

## 2023-08-18 NOTE — Progress Notes (Addendum)
Sherri Schultz   DOB:July 15, 1966   WU#:[id number]   GNF#:[id number]  Subjective:  She appears more SOB today. She however seems more motivated.  Objective:  Vitals:   08/18/23 1225 08/18/23 1439  BP:    Pulse:    Resp:    Temp:    SpO2: 91% 92%    Body mass index is 26.9 kg/m.  Intake/Output Summary (Last 24 hours) at 08/18/2023 1645 Last data filed at 08/18/2023 1033 Gross per 24 hour  Intake 100 ml  Output --  Net 100 ml     Resting, on oxygen, able to speak full sentences             No other distress  CBG (last 3)  No results for input(s): "GLUCAP" in the last 72 hours.   Labs:  Lab Results  Component Value Date   WBC 15.8 (H) 08/18/2023   HGB 13.0 08/18/2023   HCT 41.4 08/18/2023   MCV 94.5 08/18/2023   PLT 192 08/18/2023   NEUTROABS 10.4 (H) 08/22/2023    @LASTCHEMISTRY @  Urine Studies No results for input(s): "UHGB", "CRYS" in the last 72 hours.  Invalid input(s): "UACOL", "UAPR", "USPG", "UPH", "UTP", "UGL", "UKET", "UBIL", "UNIT", "UROB", "ULEU", "UEPI", "UWBC", "URBC", "UBAC", "CAST", "UCOM", "BILUA"  Basic Metabolic Panel: Recent Labs  Lab 08/12/23 0318 08/14/23 0326 08/15/23 0255 08/16/23 0313 08/17/23 0043 08/18/23 0314  NA 132* 131* 130* 135 133* 134*  K 3.9 3.5 3.3* 3.5 3.8 3.4*  CL 94* 89* 87* 88* 84* 86*  CO2 24 27 27  32 32 32  GLUCOSE 100* 110* 103* 118* 112* 122*  BUN 11 19 23* 27* 27* 33*  CREATININE 0.65 0.55 0.72 0.83 0.80 0.91  CALCIUM 8.6* 9.1 8.8* 9.3 9.2 9.3  MG 1.9  --   --   --   --   --   PHOS 3.6  --   --   --   --   --    GFR Estimated Creatinine Clearance: 68 mL/min (by C-G formula based on SCr of 0.91 mg/dL). Liver Function Tests: No results for input(s): "AST", "ALT", "ALKPHOS", "BILITOT", "PROT", "ALBUMIN" in the last 168 hours. No results for input(s): "LIPASE", "AMYLASE" in the last 168 hours. No results for input(s): "AMMONIA" in the last 168 hours. Coagulation profile Recent Labs  Lab  08/18/23 0314  INR 1.2    CBC: Recent Labs  Lab 08/14/23 0326 08/15/23 0255 08/16/23 0313 08/17/23 0043 08/18/23 0314  WBC 11.7* 13.3* 14.3* 16.9* 15.8*  HGB 12.2 11.5* 12.5 12.6 13.0  HCT 38.4 35.8* 39.3 40.1 41.4  MCV 94.8 92.5 94.9 92.6 94.5  PLT 287 281 291 233 192   Cardiac Enzymes: No results for input(s): "CKTOTAL", "CKMB", "CKMBINDEX", "TROPONINI" in the last 168 hours. BNP: Invalid input(s): "POCBNP" CBG: No results for input(s): "GLUCAP" in the last 168 hours. D-Dimer No results for input(s): "DDIMER" in the last 72 hours. Hgb A1c No results for input(s): "HGBA1C" in the last 72 hours. Lipid Profile No results for input(s): "CHOL", "HDL", "LDLCALC", "TRIG", "CHOLHDL", "LDLDIRECT" in the last 72 hours. Thyroid function studies No results for input(s): "TSH", "T4TOTAL", "T3FREE", "THYROIDAB" in the last 72 hours.  Invalid input(s): "FREET3" Anemia work up No results for input(s): "VITAMINB12", "FOLATE", "FERRITIN", "TIBC", "IRON", "RETICCTPCT" in the last 72 hours. Microbiology Recent Results (from the past 240 hour(s))  MRSA Next Gen by PCR, Nasal     Status: None   Collection Time: 08/09/23  3:41 AM  Specimen: Nasal Mucosa; Nasal Swab  Result Value Ref Range Status   MRSA by PCR Next Gen NOT DETECTED NOT DETECTED Final    Comment: (NOTE) The GeneXpert MRSA Assay (FDA approved for NASAL specimens only), is one component of a comprehensive MRSA colonization surveillance program. It is not intended to diagnose MRSA infection nor to guide or monitor treatment for MRSA infections. Test performance is not FDA approved in patients less than 57 years old. Performed at [facility], [address] [place], [place] [address]    Reviewed pathology report.   FINAL MICROSCOPIC DIAGNOSIS:  A. LYMPH NODE, STATION 4R, FINE NEEDLE ASPIRATION:  - Malignant cells present   B. LYMPH NODE, STATION 7, FINE NEEDLE ASPIRATION:  - Malignant cells  present. See Comment.   Additional immunohistochemical stains reveal tumor cells are positive  for CK7, focal PAX8, and focal GATA3 and negative for CDX2 and CK20,  indicating possible breast/mullerian origin.  Given the patient's  history of breast carcinoma, a breast origin is more likely.  Clinical  and imaging correlation is recommended for further assessment.   ER neg, PR neg, Her 2 2+ by IHC, FISH pending  Radiology CT angio  IMPRESSION: 1. No evidence of significant pulmonary embolus. 2. Persistent finding of mediastinal lymphadenopathy, unchanged. 3. Large left and small right pleural effusions are progressing since prior CT. 4. Diffuse interstitial and alveolar infiltrates throughout both lungs is also progressing. Changes may represent edema or multifocal pneumonia. Aspiration or underlying neoplasm could also be considered.   CT abdomen/pelvis IMPRESSION: 1. No acute intra-abdominal or intrapelvic abnormality. No intra-abdominal or intrapelvic metastatic findings. 2. Irregular interstitial and patchy airspace opacities of visualized lower lungs. Persistent moderate volume partially visualized left pleural effusion with question associate pleural thickening. Trace right pleural effusion. Findings better evaluated on CT angiography chest 07/29/2023. 3. Persistent indeterminate T9 mixed lytic and sclerotic osseous.  Assessment: 57 y.o.  ASSESSMENT: 57 y.o. 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  The Multi-Cancer Panel offered by [facility] includes also found no deleterious mutations inAIP, ALK, APC, ATM, AXIN2,BAP1,  BARD1, BLM, BMPR1A, BRCA1, BRCA2, BRIP1, CASR, CDC73,  CDH1, CDK4, CDKN1B, CDKN1C, CDKN2A (p14ARF), CDKN2A (p16INK4a), CEBPA, CHEK2, CTNNA1, DICER1, DIS3L2, EGFR (c.2369C>T, p.Thr790Met variant only), EPCAM (Deletion/duplication testing only), FH, FLCN, GATA2, GPC3, GREM1 (Promoter region deletion/duplication testing only), HOXB13 (c.251G>A, p.Gly84Glu), HRAS, KIT, MAX, MEN1, MET, MITF (c.952G>A, p.Glu318Lys variant only), MLH1, MSH2, MSH3, MSH6, MUTYH, NBN, NF1, NF2, NTHL1, PALB2, PDGFRA, PHOX2B, PMS2, POLD1, POLE, POT1, PRKAR1A, PTCH1, PTEN, RAD50, RAD51C, RAD51D, RB1, RECQL4, RET, RNF43, RUNX1, SDHAF2, SDHA (sequence changes only), SDHB, SDHC, SDHD, SMAD4, SMARCA4, SMARCB1, SMARCE1, STK11, SUFU, TERC, TERT, TMEM127, TP53, TSC1, TSC2, VHL, WRN and WT1.                (a) a variant of  uncertain significance was detected in the MSH3 gene called c.230_232dup (p.Pro77dup).    (2) status post bilateral lumpectomies 02/15/2020, showing             (a) on the right, no evidence of malignancy (radial scar, fibroadenoma).             (b) on the left, ductal carcinoma in situ, grade 3, measuring 1.4 cm,  with negative margins   (3) adjuvant radiation 03/20/2020 - 05/04/2020             (a) left breast / 50.4 Gy in 28 fractions             (b) seroma boost / 10 Gy in 5 fractions   (4) started tamoxifen 05/29/2020  (5) She is now diagnosed with metastatic disease to left pleural effusion, pleural thickening, wide spread bone mets, s/p biopsy to the 4 R LN and lymph node station 7, final path showing malignant cells consistent with breast primary, ER, PR neg, Her 2 2+, FISH pending.  PLAN  She continues to be SOB. I discussed with Dr Vassie Loll, severe hypoxia is likely related to lymphangitic spread of cancer. We are waiting for Her 2 FISH results, which are anticipated tomorrow. We will pursue inpt chemotherapy depending on Her 2 results. Pt is keen to try anything that might help her. I have discussed the above mentioned with the patient. I will see her back  tomorrow and review the plan.  Rachel Moulds, MD 08/18/2023  4:45 PM

## 2023-08-18 NOTE — Radiation Completion Notes (Signed)
Radiation Oncology         [phone] ________________________________  Name: Sherri Schultz MRN: [id number]  Date of Service: 08/15/2023  DOB: 12-22-1965  End of Treatment Note    Diagnosis:  Metastatic Adenocarcinoma   Intent: Palliative     ==========DELIVERED PLANS==========  First Treatment Date: 2023-08-01 - Last Treatment Date: 2023-08-15   Plan Name: Spine_C4-6 Site: Cervical Spine Technique: 3D Mode: Photon Dose Per Fraction: 3 Gy Prescribed Dose (Delivered / Prescribed): 30 Gy / 30 Gy Prescribed Fxs (Delivered / Prescribed): 10 / 10     ==========ON TREATMENT VISIT DATES========== 2023-08-01, 08/01/2023, 2023-08-15   See weekly On Treatment Notes in Epic for details. The patient tolerated radiation. She developed fatigue but had improvement in her pain by the time she completed therapy.   The patient will receive a call in about one month from the radiation oncology department. She will continue follow up with Dr. Al Pimple as well.      Osker Mason, PAC

## 2023-08-18 NOTE — Progress Notes (Signed)
   08/18/23 2009  BiPAP/CPAP/SIPAP  BiPAP/CPAP/SIPAP Pt Type Adult  Reason BIPAP/CPAP not in use Non-compliant (Patient is unable to use CPAP qhs while on HHFNC and she does not want to either.)  FiO2 (%) 81 %  BiPAP/CPAP /SiPAP Vitals  SpO2 (!) 89 %  Bilateral Breath Sounds Diminished

## 2023-08-18 NOTE — Progress Notes (Signed)
PROGRESS NOTE Sherri Schultz  ION:[id number] DOB: [birth year] DOA: 08/15/23 PCP: Elizabeth Palau, FNP  Brief Narrative/Hospital Course: Sherri Schultz is a 57 y.o. female with a history of OSA,breast cancer, migraine headaches, right leg melanoma, anxiety, hypertension, malignant pleural effusion presented secondary to worsening shortness of breath and found to have recurrent left sided pleural effusion requiring thoracentesis on admission on 08/15/23 and admitted to stepdown unit. Some improvement immediately after thoracentesis with worsening dyspnea likely related to re-accumulation.  Pulmonary critical care following.  Patient with ongoing oxygen need, had worsening oxygen requirement 9/17 night 9/18 AM steroid trial started, remains on diuretics and on serial ultrasound to assess for appropriate pocket for Pleurx catheter. Seen by her oncologist again 9/20, planning to continue chemotherapy as outpatient   Subjective: Seen and examined Reports having trouble breathing overnight and also some chest pain Currently on bedside chair Overnight afebrile-on 50 HHFNC Had BM after post laxatives  Assessment and Plan: Principal Problem:   Acute respiratory distress Active Problems:   Acute respiratory failure with hypoxia (HCC)   Malignant pleural effusion   Acute on chronic respiratory failure with hypoxia Recurrent malignant left-sided pleural effusion-positive cytology 9/9 possible breast/mllerian origin: Hypoxia in the setting of pleural effusion and compressive atelectasis AND parenchymal disease- with metastatic cancer. US guided thoracentesis on August 15, 2023 w/  710 cc removed- felt better initially and  unable to place Pleurx catheter as not enough fluid.  Remains on HHFNC since 9/17 night.PCCM following closely- IR consulted for Plerex catheter 9/23. Oncology following-patient wondering about inpatient chemo oncology to discuss today to see if there is any role. CT chest  with moderate multiloculated left pleural effusion with some decrease in size compared to prior, heterogeneous bilateral airspace disease unchanged, left anterior chest mass 2X 1.9 cm left parasternal PCCM o/b- continue  on Lasix 40 mg, Solu-Medrol 40 mg, supplemental oxygen, bronchodilators antitussives Net IO Since Admission: -3,420.15 mL [08/18/23 0750]   Metastatic breast cancer with lung and pleural mets Bilateral shoulder and neck pain and cervical spine metastasis: S/p radiation therapy 9/17,9/19, 9/20 to spinal mets  and completed. Pain better, cont her pain meds-oxycodone, Atarax, tizanidine prn. Oncology  following.  Mild hyponatremia Hypokalemia: K slightly low replace IV  Anemia of chronic disease likely from malignancy: Hemoglobin stable  11-12gm-improved.  Leukocytosis: in the setting of steroid use.Afebrile.Monitor  GERD cont PPI  Anxiety disorder:  Mood has been relatively stable, continue Effexor,inderal and atarax.  Tachycardia, chronic: likely in the setting of malignancy, deconditioning.  Improved, on inderal  OSA: Patient reports she was supposed to get her CPAP machine after her diagnosis back in June but has not been approved and PCCM following up  Chronic constipation: normally gets BM once a week.  On stool softeners/MiraLAX last BM 9/22  DVT prophylaxis: heparin injection 5,000 Units Start: 08/12/2023 2200 Code Status:   Code Status: Full Code Family Communication: plan of care discussed with patient at bedside. Patient status is: Inpatient because of respiratory failure Level of care: Stepdown   Dispo: The patient is from: Home with family            Anticipated disposition: TBD pending improvement in respiratory failure  Objective: Vitals last 24 hrs: Vitals:   08/18/23 0200 08/18/23 0300 08/18/23 0356 08/18/23 0400  BP: 111/68   115/68  Pulse: 92 94  98  Resp: (!) 23 (!) 22  20  Temp:   98.2 F (36.8 C)   TempSrc:  SpO2: 91% 91%  91%   Weight:      Height:       Weight change:   Physical Examination: General exam: alert awake, oriented , on HHFNC HEENT:Oral mucosa moist, Ear/Nose WNL grossly Respiratory system: Bilaterally crackles present. Cardiovascular system: S1 & S2 +,No JVD. Gastrointestinal system: Abdomen soft,NT,ND, BS+ Nervous System: Alert, awake, moving all extremities,and following commands. Extremities: LE edema neg,distal peripheral pulses palpable and warm.  Skin: No rashes,no icterus. MSK: Normal muscle bulk,tone, power   Medications reviewed:  Scheduled Meds:  Chlorhexidine Gluconate Cloth  6 each Topical Q0600   furosemide  40 mg Intravenous Daily   heparin injection (subcutaneous)  5,000 Units Subcutaneous Q8H   influenza vac split trivalent PF  0.5 mL Intramuscular Tomorrow-1000   ipratropium-albuterol  3 mL Nebulization BID   lidocaine  30 mL Intradermal Once   methylPREDNISolone (SOLU-MEDROL) injection  40 mg Intravenous Daily   pantoprazole  40 mg Oral Daily   propranolol ER  80 mg Oral Daily   senna-docusate  1 tablet Oral Daily   silver sulfADIAZINE   Topical Daily   venlafaxine XR  150 mg Oral Daily   Continuous Infusions:  sodium chloride Stopped (07/28/2023 6962)   potassium chloride      Diet Order             Diet NPO time specified Except for: Sips with Meds  Diet effective midnight                  Intake/Output Summary (Last 24 hours) at 08/18/2023 0750 Last data filed at 08/17/2023 1300 Gross per 24 hour  Intake --  Output 600 ml  Net -600 ml   Net IO Since Admission: -3,420.15 mL [08/18/23 0750]  Wt Readings from Last 3 Encounters:  08/10/23 72.2 kg  08/04/23 74.8 kg  07/31/23 73.9 kg     Unresulted Labs (From admission, onward)     Start     Ordered   08/17/23 0745  Procalcitonin  Add-on,   AD       References:    Procalcitonin Lower Respiratory Tract Infection AND Sepsis Procalcitonin Algorithm  Question:  Specimen collection method  Answer:   Lab=Lab collect   08/17/23 0744   08/15/23 0500  Creatinine, serum  (enoxaparin (LOVENOX)    CrCl >/= 30 ml/min)  Weekly,   R     Comments: while on enoxaparin therapy    08/24/2023 1637          Data Reviewed: I have personally reviewed following labs and imaging studies CBC: Recent Labs  Lab 08/14/23 0326 08/15/23 0255 08/16/23 0313 08/17/23 0043 08/18/23 0314  WBC 11.7* 13.3* 14.3* 16.9* 15.8*  HGB 12.2 11.5* 12.5 12.6 13.0  HCT 38.4 35.8* 39.3 40.1 41.4  MCV 94.8 92.5 94.9 92.6 94.5  PLT 287 281 291 233 192   Basic Metabolic Panel: Recent Labs  Lab 08/12/23 0318 08/14/23 0326 08/15/23 0255 08/16/23 0313 08/17/23 0043 08/18/23 0314  NA 132* 131* 130* 135 133* 134*  K 3.9 3.5 3.3* 3.5 3.8 3.4*  CL 94* 89* 87* 88* 84* 86*  CO2 24 27 27  32 32 32  GLUCOSE 100* 110* 103* 118* 112* 122*  BUN 11 19 23* 27* 27* 33*  CREATININE 0.65 0.55 0.72 0.83 0.80 0.91  CALCIUM 8.6* 9.1 8.8* 9.3 9.2 9.3  MG 1.9  --   --   --   --   --   PHOS 3.6  --   --   --   --   --  GFR: Estimated Creatinine Clearance: 68 mL/min (by C-G formula based on SCr of 0.91 mg/dL). No results for input(s): "PROCALCITON", "LATICACIDVEN" in the last 168 hours.  Recent Results (from the past 240 hour(s))  SARS Coronavirus 2 by RT PCR (hospital order, performed in [facility] hospital lab) *cepheid single result test* Anterior Nasal Swab     Status: None   Collection Time: 08/23/2023  1:48 PM   Specimen: Anterior Nasal Swab  Result Value Ref Range Status   SARS Coronavirus 2 by RT PCR NEGATIVE NEGATIVE Final    Comment: (NOTE) SARS-CoV-2 target nucleic acids are NOT DETECTED.  The SARS-CoV-2 RNA is generally detectable in upper and lower respiratory specimens during the acute phase of infection. The lowest concentration of SARS-CoV-2 viral copies this assay can detect is 250 copies / mL. A negative result does not preclude SARS-CoV-2 infection and should not be used as the sole basis for treatment or  other patient management decisions.  A negative result may occur with improper specimen collection / handling, submission of specimen other than nasopharyngeal swab, presence of viral mutation(s) within the areas targeted by this assay, and inadequate number of viral copies (<250 copies / mL). A negative result must be combined with clinical observations, patient history, and epidemiological information.  Fact Sheet for Patients:   RoadLapTop.co.za  Fact Sheet for Healthcare Providers: http://kim-miller.com/  This test is not yet approved or  cleared by the [place] FDA and has been authorized for detection and/or diagnosis of SARS-CoV-2 by FDA under an Emergency Use Authorization (EUA).  This EUA will remain in effect (meaning this test can be used) for the duration of the COVID-19 declaration under Section 564(b)(1) of the Act, 21 U.S.C. section 360bbb-3(b)(1), unless the authorization is terminated or revoked sooner.  Performed at [facility], [address] [place], [place] [address]   MRSA Next Gen by PCR, Nasal     Status: None   Collection Time: 08/09/23  3:41 AM   Specimen: Nasal Mucosa; Nasal Swab  Result Value Ref Range Status   MRSA by PCR Next Gen NOT DETECTED NOT DETECTED Final    Comment: (NOTE) The GeneXpert MRSA Assay (FDA approved for NASAL specimens only), is one component of a comprehensive MRSA colonization surveillance program. It is not intended to diagnose MRSA infection nor to guide or monitor treatment for MRSA infections. Test performance is not FDA approved in patients less than 51 years old. Performed at [facility], [address] [place], [place] [address]   Antimicrobials: Anti-infectives (From admission, onward)    None     Culture/Microbiology    Component Value Date/Time   SDES PLEURAL 07/18/2023 1108   SDES PLEURAL 07/18/2023 1108    SPECREQUEST NONE 07/18/2023 1108   SPECREQUEST NONE 07/18/2023 1108   CULT  07/18/2023 1108    NO GROWTH 5 DAYS Performed at [facility], [address], [place], [place] [address]    REPTSTATUS 07/23/2023 FINAL 07/18/2023 1108   REPTSTATUS 07/18/2023 FINAL 07/18/2023 1108   Radiology Studies: DG Chest Port 1 View  Result Date: 08/17/2023 CLINICAL DATA:  Respiratory distress EXAM: PORTABLE CHEST 1 VIEW COMPARISON:  08/13/2023 FINDINGS: Lung volumes are small. Stable asymmetric left-sided volume loss. Left pleural thickening or laterally loculated left pleural effusion is unchanged. Extensive asymmetric multifocal pulmonary infiltrate is again seen related to changes of probable lymphangitic spread of malignancy better appreciated on prior CT examination cardiac size within normal limits. No acute bone  abnormality. IMPRESSION: 1. No significant interval change. Extensive asymmetric multifocal pulmonary infiltrate related to changes of probable lymphangitic spread of malignancy better appreciated on prior CT examination. Electronically Signed   By: Helyn Numbers M.D.   On: 08/17/2023 02:03   CT CHEST W CONTRAST  Result Date: 08/16/2023 CLINICAL DATA:  Left non-small-cell lung carcinoma. Left pleural effusion. Staging. * Tracking Code: BO * EXAM: CT CHEST WITH CONTRAST TECHNIQUE: Multidetector CT imaging of the chest was performed during intravenous contrast administration. RADIATION DOSE REDUCTION: This exam was performed according to the departmental dose-optimization program which includes automated exposure control, adjustment of the mA and/or kV according to patient size and/or use of iterative reconstruction technique. CONTRAST:  75mL OMNIPAQUE IOHEXOL 300 MG/ML  SOLN COMPARISON:  07/29/2023 FINDINGS: Cardiovascular:  No acute findings. Mediastinum/Nodes: Mediastinal lymphadenopathy in the right paratracheal and subcarinal regions shows no significant change, with largest area in the right  paratracheal region measuring 3.5 x 2.0 cm on image 46/2. Mild bilateral hilar lymphadenopathy also without significant change. Lungs/Pleura: Moderate multiloculated left pleural effusion shows mild decrease in size since previous study. Diffuse pulmonary interstitial thickening and heterogeneous airspace disease is again seen bilaterally, greatest in the lower lobes, also without significant change. Upper Abdomen:  Unremarkable. Musculoskeletal: New small left anterior chest wall mass seen in the left parasternal region, measuring 2.0 x 1.9 cm on image 60/2. No suspicious bone lesions. IMPRESSION: Stable mediastinal and bilateral hilar lymphadenopathy. No significant change in diffuse interstitial infiltrates and heterogeneous airspace disease, greatest in the lower lobes. New 2 cm left parasternal chest wall mass, suspicious for metastatic disease. Mild decrease in size of moderate multiloculated left pleural effusion. Electronically Signed   By: Danae Orleans M.D.   On: 08/16/2023 15:02     LOS: 10 days   Lanae Boast, MD [facility]  08/18/2023, 7:50 AM

## 2023-08-18 NOTE — Progress Notes (Signed)
PT back in room post IR on [facility] system- vitals on flowsheet.

## 2023-08-18 NOTE — Plan of Care (Signed)
  Problem: Education: Goal: Knowledge of General Education information will improve Description: Including pain rating scale, medication(s)/side effects and non-pharmacologic comfort measures Outcome: Progressing   Problem: Clinical Measurements: Goal: Ability to maintain clinical measurements within normal limits will improve Outcome: Progressing Goal: Will remain free from infection Outcome: Progressing   Problem: Activity: Goal: Risk for activity intolerance will decrease Outcome: Progressing   Problem: Nutrition: Goal: Adequate nutrition will be maintained Outcome: Progressing   Problem: Elimination: Goal: Will not experience complications related to bowel motility Outcome: Progressing Goal: Will not experience complications related to urinary retention Outcome: Progressing   Problem: Clinical Measurements: Goal: Respiratory complications will improve Outcome: Not Progressing   Problem: Coping: Goal: Level of anxiety will decrease Outcome: Not Progressing

## 2023-08-18 NOTE — Progress Notes (Signed)
BLE venous duplex has been completed.   Results can be found under chart review under CV PROC. 08/18/2023 3:59 PM Genie Wenke RVT, RDMS

## 2023-08-18 NOTE — Plan of Care (Signed)
  Problem: Education: Goal: Knowledge of General Education information will improve Description: Including pain rating scale, medication(s)/side effects and non-pharmacologic comfort measures Outcome: Progressing   Problem: Elimination: Goal: Will not experience complications related to bowel motility Outcome: Progressing   Problem: Pain Managment: Goal: General experience of comfort will improve Outcome: Progressing   Problem: Clinical Measurements: Goal: Will remain free from infection Outcome: Not Progressing   Problem: Coping: Goal: Level of anxiety will decrease Outcome: Not Progressing   Problem: Skin Integrity: Goal: Risk for impaired skin integrity will decrease Outcome: Completed/Met

## 2023-08-18 NOTE — Progress Notes (Addendum)
NAME:  Sherri Schultz, MRN:  [id number], DOB:  03/29/1966, LOS: 10 ADMISSION DATE:  Aug 21, 2023, CONSULTATION DATE:  08/18/23 REFERRING MD:  Caleb Popp - TRH, CHIEF COMPLAINT:  DOE   History of Present Illness:  57 year old woman with history malignant pleural effusion, chronic hypoxemia on 3 L nasal cannula at home presents with worsening dyspnea and hypoxemia. 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  Mediastinal lymphadenopathy status post EBUS 4R and station 7 showing malignant cells consistent with breast primary, ER/PR negative, HER2 pending  Contacted pulmonary office.  Has been trying to set up outpatient Pleurx.  Worsening symptoms but unable to get Pleurx done in short notice.  Advised to go to ED.  Chest x-ray showed loculated left pleural effusion o.  As well as scattered multifocal infiltrates consistent with parenchymal lung disease.Marland Kitchen  Underwent thoracentesis with IR Aug 21, 2023 with removal of 700 cc on left, improvement in hypoxemia.  From nonrebreather down to 4 L at time of evaluation.    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   Cancer (HCC) 2013   right knee   Dyspnea    Family history of brain cancer    Family history of breast cancer    Family history of colon cancer    Family history of melanoma    GERD (gastroesophageal reflux disease)    Headache    Hypertension    Personal history of radiation therapy    Sleep apnea    Trimalleolar fracture of ankle, closed, left, initial encounter    Significant Hospital Events: Including procedures, antibiotic start and stop dates in addition to other pertinent events   21-Aug-2023 Admitted to the hospital with worsening dyspnea on exertion and hypoxemia with recurrent left pleural  effusion, improvement in symptoms and hypoxemia after thoracentesis 9/16 Stable O2 requirements, 10L [place]. 9/17 Radiation today, tolerated well. O2 requirements stable. 9/21 CT chest > moderate multiloculated left pleural effusion with some decrease in size compared with priors.  Heterogeneous bilateral airspace disease, unchanged.  New small left anterior chest wall mass 2.0 x 1.9 cm left parasternal region  Interim History / Subjective:   Increasing hypoxia on heated high flow nasal cannula 80% / 50 L Complains of severe anxiety    Objective:   Blood pressure 111/70, pulse (!) 101, temperature 98.3 F (36.8 C), temperature source Oral, resp. rate (!) 22, height 5' 4.5" (1.638 m), weight 72.2 kg, last menstrual period 03/27/2011, SpO2 92%.    FiO2 (%):  [70 %-80 %] 80 %   Intake/Output Summary (Last 24 hours) at 08/18/2023 0914 Last data filed at 08/17/2023 1300 Gross per 24 hour  Intake --  Output 600 ml  Net -600 ml   Filed Weights   Aug 21, 2023 1245 08/09/23 0340 08/10/23 1930  Weight: 78.4 kg 73.1 kg 72.2 kg   Physical Examination: General: Chronically ill, in bed, anxious affect Neuro: Awake, interactive, nonfocal HEENT: Oropharynx clear, no secretions, strong voice, no stridor Cardiovascular: Distant, regular, no murmur Lungs: Mild accessory muscle use, decreased breath sounds on left Abdomen: Nondistended with positive bowel sounds Musculoskeletal: Left more than right swelling Skin: No rash  CT chest with contrast 9/21 shows stable mediastinal and bilateral hilar lymphadenopathy, diffuse bilateral interstitial infiltrates and new 2 cm  left parasternal chest wall mass with decrease in size of multiloculated left pleural effusion   Assessment & Plan:  Acute on chronic hypoxemic respiratory failure Recurrent left-sided effusion, malignant, breast/mllerian.   Recurrent breast cancer ER/PR negative, HER2 pending S/p IR thoracentesis August 26, 2023 with of amber fluid  removed.  Severe hypoxia likely related to lymphangitic spread of cancer -Plan is for Pleurx catheter once hypoxia improves -She has completed XRT to her spinal mets   -Unfortunately hypoxia is worsening and she may need chemotherapy to be initiated as inpatient.  Oncology has been contacted -Will obtain venous duplex to rule out DVT -Use morphine 5 mg every 4 for pain and anxiety, this should also help alleviate work of breathing.  I discussed goals of care and she desires mechanical ventilation if required    Cyril Mourning MD. FCCP. [facility] Pager : [phone]  If no response to pager , please call [phone] until 7 pm After 7:00 pm call Elink  [phone]    08/18/2023, 9:14 AM

## 2023-08-19 ENCOUNTER — Other Ambulatory Visit: Payer: Self-pay | Admitting: Hematology and Oncology

## 2023-08-19 ENCOUNTER — Encounter: Payer: Self-pay | Admitting: Hematology and Oncology

## 2023-08-19 ENCOUNTER — Ambulatory Visit: Payer: BC Managed Care – PPO | Admitting: Adult Health

## 2023-08-19 DIAGNOSIS — C50919 Malignant neoplasm of unspecified site of unspecified female breast: Secondary | ICD-10-CM | POA: Diagnosis not present

## 2023-08-19 DIAGNOSIS — C7802 Secondary malignant neoplasm of left lung: Secondary | ICD-10-CM

## 2023-08-19 DIAGNOSIS — J9601 Acute respiratory failure with hypoxia: Secondary | ICD-10-CM | POA: Diagnosis not present

## 2023-08-19 DIAGNOSIS — R0603 Acute respiratory distress: Secondary | ICD-10-CM | POA: Diagnosis not present

## 2023-08-19 LAB — BASIC METABOLIC PANEL
Anion gap: 19 — ABNORMAL HIGH (ref 5–15)
BUN: 36 mg/dL — ABNORMAL HIGH (ref 6–20)
CO2: 28 mmol/L (ref 22–32)
Calcium: 9 mg/dL (ref 8.9–10.3)
Chloride: 87 mmol/L — ABNORMAL LOW (ref 98–111)
Creatinine, Ser: 0.81 mg/dL (ref 0.44–1.00)
GFR, Estimated: >60 mL/min (ref >60)
Glucose, Bld: 105 mg/dL — ABNORMAL HIGH (ref 70–99)
Potassium: 3.4 mmol/L — ABNORMAL LOW (ref 3.5–5.1)
Sodium: 134 mmol/L — ABNORMAL LOW (ref 135–145)

## 2023-08-19 LAB — TROPONIN I (HIGH SENSITIVITY): Troponin I (High Sensitivity): 5 ng/L (ref <18)

## 2023-08-19 LAB — CBC
HCT: 39.5 % (ref 36.0–46.0)
Hemoglobin: 12.6 g/dL (ref 12.0–15.0)
MCH: 29.9 pg (ref 26.0–34.0)
MCHC: 31.9 g/dL (ref 30.0–36.0)
MCV: 93.8 fL (ref 80.0–100.0)
Platelets: 145 10*3/uL — ABNORMAL LOW (ref 150–400)
RBC: 4.21 MIL/uL (ref 3.87–5.11)
RDW: 12.7 % (ref 11.5–15.5)
WBC: 15.2 10*3/uL — ABNORMAL HIGH (ref 4.0–10.5)
nRBC: 0.1 % (ref 0.0–0.2)

## 2023-08-19 LAB — CYTOLOGY - NON PAP

## 2023-08-19 MED ORDER — ORAL CARE MOUTH RINSE
15.0000 mL | OROMUCOSAL | Status: DC
  Administered 2023-08-19 – 2023-08-20 (×3): 15 mL via OROMUCOSAL

## 2023-08-19 MED ORDER — DOCUSATE SODIUM 50 MG/5ML PO LIQD: 50.0000 mg | Freq: Every day | ORAL | Status: DC

## 2023-08-19 MED ORDER — ACETAMINOPHEN 650 MG RE SUPP: 650.0000 mg | RECTAL | Status: DC | PRN

## 2023-08-19 MED ORDER — ACETAMINOPHEN 160 MG/5ML PO SOLN: 650.0000 mg | Freq: Four times a day (QID) | ORAL | Status: DC | PRN

## 2023-08-19 MED ORDER — PANTOPRAZOLE SODIUM 40 MG IV SOLR
40.0000 mg | INTRAVENOUS | Status: DC
  Administered 2023-08-20: 40 mg via INTRAVENOUS
  Filled 2023-08-19: qty 10

## 2023-08-19 MED ORDER — SENNOSIDES 8.8 MG/5ML PO SYRP
5.0000 mL | ORAL_SOLUTION | Freq: Every day | ORAL | Status: DC
  Filled 2023-08-19: qty 5

## 2023-08-19 MED ORDER — ORAL CARE MOUTH RINSE: 15.0000 mL | OROMUCOSAL | Status: DC | PRN

## 2023-08-19 MED ORDER — ALPRAZOLAM 0.5 MG PO TABS
0.5000 mg | ORAL_TABLET | Freq: Two times a day (BID) | ORAL | Status: DC
  Administered 2023-08-19 (×2): 0.5 mg via ORAL
  Filled 2023-08-19 (×3): qty 1

## 2023-08-19 NOTE — Progress Notes (Signed)
   08/19/23 1219  Therapy Vitals  Pulse Rate (!) 109  Resp 19  MEWS Score/Color  MEWS Score 1  MEWS Score Color Green  Oxygen Therapy/Pulse Ox  O2 Device (S)  HHFNC;Non-rebreather Mask (Added NRB 100% and increased FI02 to 100% via HHFNC.)  O2 Therapy Oxygen humidified  Heater temperature 87.8 F (31 C)  O2 Flow Rate (L/min) 50 L/min  FiO2 (%) 100 %  SpO2 (!) 86 %  Safety Instructions Yes (Comment)

## 2023-08-19 NOTE — Progress Notes (Signed)
NAME:  Sherri Schultz, MRN:  [id number], DOB:  25-Nov-1966, LOS: 11 ADMISSION DATE:  30-Aug-2023, CONSULTATION DATE:  08/19/23 REFERRING MD:  Caleb Popp - TRH, CHIEF COMPLAINT:  DOE   History of Present Illness:  56 year old woman with history malignant pleural effusion, chronic hypoxemia on 3 L nasal cannula at home presents with worsening dyspnea and hypoxemia. 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  Mediastinal lymphadenopathy status post EBUS 4R and station 7 showing malignant cells consistent with breast primary, ER/PR negative, HER2 pending  Contacted pulmonary office.  Has been trying to set up outpatient Pleurx.  Worsening symptoms but unable to get Pleurx done in short notice.  Advised to go to ED.  Chest x-ray showed loculated left pleural effusion o.  As well as scattered multifocal infiltrates consistent with parenchymal lung disease.Marland Kitchen  Underwent thoracentesis with IR 08-30-23 with removal of 700 cc on left, improvement in hypoxemia.  From nonrebreather down to 4 L at time of evaluation.    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   Cancer (HCC) 2013   right knee   Dyspnea    Family history of brain cancer    Family history of breast cancer    Family history of colon cancer    Family history of melanoma    GERD (gastroesophageal reflux disease)    Headache    Hypertension    Personal history of radiation therapy    Sleep apnea    Trimalleolar fracture of ankle, closed, left, initial encounter    Significant Hospital Events: Including procedures, antibiotic start and stop dates in addition to other pertinent events   Aug 30, 2023 Admitted to the hospital with worsening dyspnea on exertion and hypoxemia with recurrent left pleural  effusion, improvement in symptoms and hypoxemia after thoracentesis 9/16 Stable O2 requirements, 10L [place]. 9/17 Radiation today, tolerated well. O2 requirements stable. 9/21 CT chest > moderate multiloculated left pleural effusion with some decrease in size compared with priors.  Heterogeneous bilateral airspace disease, unchanged.  New small left anterior chest wall mass 2.0 x 1.9 cm left parasternal region 9/23 venous duplex negative BLE  Interim History / Subjective:   Complains of shortness of breath and severe anxiety Remains critically ill on heated high flow nasal cannula 90% / 50 L Tachycardic Afebrile    Objective:   Blood pressure (!) 123/102, pulse (!) 114, temperature 98.1 F (36.7 C), temperature source Oral, resp. rate (!) 25, height 5' 4.5" (1.638 m), weight 72.2 kg, last menstrual period 03/27/2011, SpO2 (!) 87%.    FiO2 (%):  [80 %-88 %] 88 %   Intake/Output Summary (Last 24 hours) at 08/19/2023 0935 Last data filed at 08/19/2023 0440 Gross per 24 hour  Intake 220 ml  Output 700 ml  Net -480 ml   Filed Weights   08-30-23 1245 08/09/23 0340 08/10/23 1930  Weight: 78.4 kg 73.1 kg 72.2 kg   Physical Examination: General: Chronically ill, in bed, anxious affect Neuro: Awake, interactive, nonfocal HEENT: Oropharynx clear, no secretions, strong voice, no stridor Cardiovascular: S1-S2 tacky, regular Lungs: Decreased breath sounds on left, no accessory muscle use Abdomen: Nondistended with positive bowel sounds Musculoskeletal: Left more than right swelling Skin: No rash  CT chest with contrast 9/21 shows  stable mediastinal and bilateral hilar lymphadenopathy, diffuse bilateral interstitial infiltrates and new 2 cm left parasternal chest wall mass with decrease in size of multiloculated left pleural effusion  Chest x-ray 9/22 bilateral multifocal infiltrates   Assessment & Plan:  Acute on chronic hypoxemic respiratory failure Recurrent left-sided effusion,  malignant, breast/mllerian.   Recurrent breast cancer ER/PR negative, HER2 pending S/p IR thoracentesis 9/13 with of amber fluid removed.  Severe hypoxia likely related to lymphangitic spread of cancer -Plan is for Pleurx catheter once hypoxia improves/or fluid recollects, not enough fluid on ultrasound 9/23 -She has completed XRT to her spinal mets   -Unfortunately hypoxia is worsening and will need chemotherapy to be initiated as inpatient.  Await H ER 2 results per oncology   Severe anxiety -Use morphine 5 mg every 4 for pain and added Xanax for anxiety, this should also help alleviate work of breathing.  Chest pain -check troponin  She desires full scope of care including mechanical ventilation if needed    Cyril Mourning MD. FCCP. [facility] Pager : [phone]  If no response to pager , please call [phone] until 7 pm After 7:00 pm call Elink  [phone]    08/19/2023, 9:35 AM

## 2023-08-19 NOTE — Progress Notes (Signed)
   08/19/23 1349  BiPAP/CPAP/SIPAP  $ Non-Invasive Ventilator  Non-Invasive Vent Set Up;Non-Invasive Vent Initial  $ Face Mask Small Yes  BiPAP/CPAP/SIPAP Pt Type Adult  BiPAP/CPAP/SIPAP V60  Mask Type Full face mask  Mask Size Small  Set Rate (S)  16 breaths/min  Respiratory Rate 17 breaths/min  IPAP (S)  14 cmH20  EPAP (S)  6 cmH2O  FiO2 (%) (S)  100 %  Flow Rate 1 lpm  Minute Ventilation 6  Leak 9  Peak Inspiratory Pressure (PIP) 14  Tidal Volume (Vt) 346  Patient Home Equipment No  Auto Titrate No  Press High Alarm 35 cmH2O  Press Low Alarm 5 cmH2O  Nasal massage performed No (comment)  CPAP/SIPAP surface wiped down Yes  BiPAP/CPAP /SiPAP Vitals  Pulse Rate 96  Resp 19  SpO2 90 %  Bilateral Breath Sounds Diminished;Fine crackles  MEWS Score/Color  MEWS Score 1  MEWS Score Color Green

## 2023-08-19 NOTE — Plan of Care (Signed)
PT has experienced a decrease in oxygenation requiring providers to order additional airway interventions. Goals for in hospital chemo administration continues. PT has not received results from [facility].

## 2023-08-19 NOTE — Progress Notes (Signed)
Sherri Schultz   DOB:10/27/1966   AO#:[id number]   ONG#:[id number]  Subjective:  She is stable today, continues to have high oxygenation needs. Pt would like to proceed with inpt chemo. She is very motivated to try any treatment that may be helpful.  Objective:  Vitals:   08/19/23 1400 08/19/23 1600  BP: 97/66 96/71  Pulse: 98 94  Resp: 19 16  Temp:  97.9 F (36.6 C)  SpO2: 92% 94%    Body mass index is 26.9 kg/m.  Intake/Output Summary (Last 24 hours) at 08/19/2023 1752 Last data filed at 08/19/2023 1321 Gross per 24 hour  Intake 120 ml  Output 575 ml  Net -455 ml     Resting, on oxygen, able to speak full sentences             Bronchial breathing scattered, diminished air entry Left             Left more than right swelling. No DVT              CBG (last 3)  No results for input(s): "GLUCAP" in the last 72 hours.   Labs:  Lab Results  Component Value Date   WBC 15.2 (H) 08/19/2023   HGB 12.6 08/19/2023   HCT 39.5 08/19/2023   MCV 93.8 08/19/2023   PLT 145 (L) 08/19/2023   NEUTROABS 10.4 (H) 07/27/2023    @LASTCHEMISTRY @  Urine Studies No results for input(s): "UHGB", "CRYS" in the last 72 hours.  Invalid input(s): "UACOL", "UAPR", "USPG", "UPH", "UTP", "UGL", "UKET", "UBIL", "UNIT", "UROB", "ULEU", "UEPI", "UWBC", "URBC", "UBAC", "CAST", "UCOM", "BILUA"  Basic Metabolic Panel: Recent Labs  Lab 08/15/23 0255 08/16/23 0313 08/17/23 0043 08/18/23 0314 08/19/23 0309  NA 130* 135 133* 134* 134*  K 3.3* 3.5 3.8 3.4* 3.4*  CL 87* 88* 84* 86* 87*  CO2 27 32 32 32 28  GLUCOSE 103* 118* 112* 122* 105*  BUN 23* 27* 27* 33* 36*  CREATININE 0.72 0.83 0.80 0.91 0.81  CALCIUM 8.8* 9.3 9.2 9.3 9.0   GFR Estimated Creatinine Clearance: 76.4 mL/min (by C-G formula based on SCr of 0.81 mg/dL). Liver Function Tests: No results for input(s): "AST", "ALT", "ALKPHOS", "BILITOT", "PROT", "ALBUMIN" in the last 168 hours. No results for input(s): "LIPASE",  "AMYLASE" in the last 168 hours. No results for input(s): "AMMONIA" in the last 168 hours. Coagulation profile Recent Labs  Lab 08/18/23 0314  INR 1.2    CBC: Recent Labs  Lab 08/15/23 0255 08/16/23 0313 08/17/23 0043 08/18/23 0314 08/19/23 0309  WBC 13.3* 14.3* 16.9* 15.8* 15.2*  HGB 11.5* 12.5 12.6 13.0 12.6  HCT 35.8* 39.3 40.1 41.4 39.5  MCV 92.5 94.9 92.6 94.5 93.8  PLT 281 291 233 192 145*   Cardiac Enzymes: No results for input(s): "CKTOTAL", "CKMB", "CKMBINDEX", "TROPONINI" in the last 168 hours. BNP: Invalid input(s): "POCBNP" CBG: No results for input(s): "GLUCAP" in the last 168 hours. D-Dimer No results for input(s): "DDIMER" in the last 72 hours. Hgb A1c No results for input(s): "HGBA1C" in the last 72 hours. Lipid Profile No results for input(s): "CHOL", "HDL", "LDLCALC", "TRIG", "CHOLHDL", "LDLDIRECT" in the last 72 hours. Thyroid function studies No results for input(s): "TSH", "T4TOTAL", "T3FREE", "THYROIDAB" in the last 72 hours.  Invalid input(s): "FREET3" Anemia work up No results for input(s): "VITAMINB12", "FOLATE", "FERRITIN", "TIBC", "IRON", "RETICCTPCT" in the last 72 hours. Microbiology No results found for this or any previous visit (from the past 240 hour(s)).  Reviewed  pathology report.   FINAL MICROSCOPIC DIAGNOSIS:  A. LYMPH NODE, STATION 4R, FINE NEEDLE ASPIRATION:  - Malignant cells present   B. LYMPH NODE, STATION 7, FINE NEEDLE ASPIRATION:  - Malignant cells present. See Comment.   Additional immunohistochemical stains reveal tumor cells are positive  for CK7, focal PAX8, and focal GATA3 and negative for CDX2 and CK20,  indicating possible breast/mullerian origin.  Given the patient's  history of breast carcinoma, a breast origin is more likely.  Clinical  and imaging correlation is recommended for further assessment.   ER neg, PR neg, Her 2 2+ by IHC, FISH pending  Radiology CT angio  IMPRESSION: 1. No evidence of  significant pulmonary embolus. 2. Persistent finding of mediastinal lymphadenopathy, unchanged. 3. Large left and small right pleural effusions are progressing since prior CT. 4. Diffuse interstitial and alveolar infiltrates throughout both lungs is also progressing. Changes may represent edema or multifocal pneumonia. Aspiration or underlying neoplasm could also be considered.   CT abdomen/pelvis IMPRESSION: 1. No acute intra-abdominal or intrapelvic abnormality. No intra-abdominal or intrapelvic metastatic findings. 2. Irregular interstitial and patchy airspace opacities of visualized lower lungs. Persistent moderate volume partially visualized left pleural effusion with question associate pleural thickening. Trace right pleural effusion. Findings better evaluated on CT angiography chest 07/29/2023. 3. Persistent indeterminate T9 mixed lytic and sclerotic osseous.  Assessment: 57 y.o.  ASSESSMENT: 57 y.o. 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  The Multi-Cancer Panel offered by [facility] includes also found no deleterious mutations inAIP, ALK, APC, ATM, AXIN2,BAP1,  BARD1, BLM, BMPR1A, BRCA1, BRCA2, BRIP1, CASR, CDC73, CDH1, CDK4, CDKN1B, CDKN1C, CDKN2A (p14ARF), CDKN2A (p16INK4a), CEBPA, CHEK2, CTNNA1, DICER1, DIS3L2, EGFR (c.2369C>T, p.Thr790Met variant only), EPCAM (Deletion/duplication testing only), FH, FLCN, GATA2, GPC3, GREM1 (Promoter region deletion/duplication testing only), HOXB13 (c.251G>A, p.Gly84Glu), HRAS, KIT, MAX, MEN1, MET, MITF (c.952G>A, p.Glu318Lys variant only), MLH1, MSH2, MSH3, MSH6, MUTYH, NBN, NF1, NF2, NTHL1, PALB2, PDGFRA, PHOX2B, PMS2, POLD1, POLE, POT1,  PRKAR1A, PTCH1, PTEN, RAD50, RAD51C, RAD51D, RB1, RECQL4, RET, RNF43, RUNX1, SDHAF2, SDHA (sequence changes only), SDHB, SDHC, SDHD, SMAD4, SMARCA4, SMARCB1, SMARCE1, STK11, SUFU, TERC, TERT, TMEM127, TP53, TSC1, TSC2, VHL, WRN and WT1.                (a) a variant of  uncertain significance was detected in the MSH3 gene called c.230_232dup (p.Pro77dup).    (2) status post bilateral lumpectomies 02/15/2020, showing             (a) on the right, no evidence of malignancy (radial scar, fibroadenoma).             (b) on the left, ductal carcinoma in situ, grade 3, measuring 1.4 cm, with negative margins   (3) adjuvant radiation 03/20/2020 - 05/04/2020             (a) left breast / 50.4 Gy in 28 fractions             (b) seroma boost / 10 Gy in 5 fractions   (4) started tamoxifen 05/29/2020  (5) She is now diagnosed with metastatic disease to left pleural effusion, pleural thickening, wide spread bone mets, s/p biopsy to the 4 R LN and lymph node station 7, final path showing malignant cells  consistent with breast primary, ER, PR neg, Her 2 2+, FISH pending.  PLAN  She continues to be SOB. I discussed with Dr Vassie Loll, severe hypoxia is likely related to lymphangitic spread of cancer.  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 We have discussed about considering inpatient paclitaxel as first-line chemotherapy here.  We discussed the palliative intent of treatment.  We have discussed adverse effects including but not limited to fatigue, alopecia, nausea, vomiting, cytopenias, neuropathy and possible pneumonitis in rare instances.  She is very much motivated to try any treatment.  I placed a treatment plan and informed the inpatient chemotherapy team to initiate chemotherapy at the earliest possibility. Anticipate weekly paclitaxel day 1, day 8 and day 15 followed by a week off If she shows any evidence of improvement in the next couple weeks, hopefully we can decrease her oxygen needs and  discharge her home safely.  At this time, she unfortunately has very high oxygen needs and according to Dr. Vassie Loll, she may not be able to be discharged home safely unless she decides to go on home hospice.   Rachel Moulds, MD 08/19/2023  5:52 PM

## 2023-08-19 NOTE — Progress Notes (Signed)
PROGRESS NOTE Sherri Schultz  UUV:[id number] DOB: 1966/10/24 DOA: 08/12/2023 PCP: Elizabeth Palau, FNP  Brief Narrative/Hospital Course: Sherri Schultz is a 57 y.o. female with a history of OSA,breast cancer, migraine headaches, right leg melanoma, anxiety, hypertension, malignant pleural effusion presented secondary to worsening shortness of breath and found to have recurrent left sided pleural effusion requiring thoracentesis on admission on 9/13 and admitted to stepdown unit.Some improvement immediately after thoracentesis with worsening dyspnea likely related to re-accumulation.  Pulmonary critical care following.  Patient with ongoing oxygen need, had worsening oxygen requirement 9/17 night 9/18 AM steroid trial per PCCM 9/20: Dr. Al Pimple from oncology following 9/23 Duplex nef for dvt b/l legs.  Subjective: Patient seen and examined this morning, on bed on HHFNC Complains of some shortness of breath, having anxiety Overnight intermittent tachycardia 90s to 115, tachypneic- RR in mid 20s BP stable, remains on HHFNC 88% FiO2 55 L/min saturating 87 to 92%.   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 S/p IR thoracentesis 9/13 in ED  710 cc removed- felt better initially-not enough fluid for pleurex> IR unsuccessful again 9/23 9/21 CT chest with moderate multiloculated left pleural effusion with some decrease in size compared to prior, heterogeneous bilateral airspace disease unchanged, left anterior chest mass 2X 1.9 cm left parasternal She is on IV Lasix 40 daily,  Solu-Medrol 40 daily, bronchodilators supplemental oxygen Patient having severe hypoxia and worsening-not responding to the therapy.O HHFNC since 9/17 Oncology following - severe hypoxia likely related to lymphangitic spread of cancer waiting for her to [facility] results and possibly pursuing inpatient chemotherapy based on results. PCCM on board, input appreciated Net IO Since Admission: -3,900.15 mL [08/19/23 1112]   Metastatic breast cancer with lung and pleural mets Bilateral shoulder and neck pain and cervical spine metastasis: S/p radiation therapy completion x 3 on 17,19,20 sept inpatient to spinal mets Pain better, cont her pain meds-oxycodone, Atarax, tizanidine prn. Oncology  following.  Severe anxiety: Doing morphine every every 4 for pain prn, Xanax added for anxiety to elevate work of breathing  Mild hyponatremia Hypokalemia: Replete K with IV potassium  Anemia of chronic disease likely from malignancy: Hemoglobin remains stable.  Leukocytosis: in the setting of steroid use.Afebrile.Monitor.  Procalcitonin 0.15.  GERD cont PPI  Anxiety disorder:  Mood has been relatively stable, continue Effexor,inderal and atarax.  Tachycardia, chronic: likely in the setting of malignancy, deconditioning.  Improved, on inderal  OSA: Patient reports she was supposed to get her CPAP machine after her diagnosis back in June but has not been approved and PCCM following up  Chronic constipation: normally gets BM once a week.  On stool softeners/MiraLAX last BM 9/22  Goals of care: Oncology pulmonary following, patient desires full scope of care including mechanical ventilation if needed Overall prognosis guarded, and remains to be seen  DVT prophylaxis: heparin injection 5,000 Units Start: 08/21/23 2200 Code Status:   Code Status: Full Code Family Communication: plan of care discussed with patient at bedside. Patient status is: Inpatient because of respiratory failure Level of care:  Stepdown   Dispo: The patient is from: Home with family  Anticipated disposition: TBD pending improvement in respiratory failure.  I had discussed with pulmonary critical care and oncology  Objective: Vitals last 24 hrs: Vitals:   08/19/23 0810 08/19/23 0815 08/19/23 0900 08/19/23 1000  BP:    106/80  Pulse: (!) 114  (!) 112 (!) 111  Resp: (!) 25  (!) 25 (!) 22  Temp:      TempSrc:      SpO2: 90% (!) 87% (!) 88% (!) 88%  Weight:      Height:       Weight change:   Physical Examination: General exam: alert awake, chills, on HHFNC,tachycardic HEENT:Oral mucosa moist, Ear/Nose WNL grossly Respiratory system: Bilaterally scattered crackles posteriorly Cardiovascular system: S1 & S2 +, tachycardia,no JVD. Gastrointestinal system: Abdomen soft,NT,ND, BS+ Nervous System: Alert, awake, moving all extremities,and following commands. Extremities: LE edema neg,distal peripheral pulses palpable and warm.  Skin: No rashes,no icterus. MSK: Normal muscle bulk,tone, power   Medications reviewed:  Scheduled Meds:  ALPRAZolam  0.5 mg Oral BID   Chlorhexidine Gluconate Cloth  6 each Topical Q0600   furosemide  40 mg Intravenous Daily   heparin injection (subcutaneous)  5,000 Units Subcutaneous Q8H   influenza vac split trivalent PF  0.5 mL Intramuscular Tomorrow-1000   ipratropium-albuterol  3 mL Nebulization BID   lidocaine  30 mL Intradermal Once   methylPREDNISolone (SOLU-MEDROL) injection  40 mg Intravenous Daily   pantoprazole  40 mg Oral Daily   propranolol ER  80 mg Oral Daily   senna-docusate  1 tablet Oral Daily   silver sulfADIAZINE   Topical Daily   venlafaxine XR  150 mg Oral Daily   Continuous Infusions:  sodium chloride Stopped (08/15/2023 5638)    Diet Order             Diet regular Room service appropriate? Yes with Assist; Fluid consistency: Thin  Diet effective now                  Intake/Output Summary (Last 24 hours) at 08/19/2023 1112 Last data  filed at 08/19/2023 0440 Gross per 24 hour  Intake 120 ml  Output 700 ml  Net -580 ml   Net IO Since Admission: -3,900.15 mL [08/19/23 1112]  Wt Readings from Last 3 Encounters:  08/10/23 72.2 kg  08/04/23 74.8 kg  07/31/23 73.9 kg     Unresulted Labs (From admission, onward)     Start     Ordered   08/19/23 0500  Basic metabolic panel  Daily,   R     Question:  Specimen collection method  Answer:  Lab=Lab collect   08/18/23 0757   08/19/23 0500  CBC  Daily,   R     Question:  Specimen collection method  Answer:  Lab=Lab collect   08/18/23 0757   08/15/23 0500  Creatinine, serum  (enoxaparin (LOVENOX)    CrCl >/= 30 ml/min)  Weekly,   R     Comments: while on enoxaparin therapy    08/25/2023 1637          Data Reviewed: I have personally reviewed following labs and imaging studies CBC: Recent Labs  Lab 08/15/23 0255 08/16/23 0313 08/17/23 0043 08/18/23 0314 08/19/23 0309  WBC 13.3* 14.3* 16.9* 15.8* 15.2*  HGB 11.5* 12.5 12.6 13.0 12.6  HCT 35.8* 39.3 40.1 41.4 39.5  MCV 92.5 94.9 92.6 94.5 93.8  PLT 281 291 233 192 145*   Basic Metabolic Panel: Recent Labs  Lab 08/15/23 0255 08/16/23 0313 08/17/23  1610 08/18/23 0314 08/19/23 0309  NA 130* 135 133* 134* 134*  K 3.3* 3.5 3.8 3.4* 3.4*  CL 87* 88* 84* 86* 87*  CO2 27 32 32 32 28  GLUCOSE 103* 118* 112* 122* 105*  BUN 23* 27* 27* 33* 36*  CREATININE 0.72 0.83 0.80 0.91 0.81  CALCIUM 8.8* 9.3 9.2 9.3 9.0  GFR: Estimated Creatinine Clearance: 76.4 mL/min (by C-G formula based on SCr of 0.81 mg/dL). Recent Labs  Lab 08/18/23 0314  PROCALCITON 0.15    No results found for this or any previous visit (from the past 240 hour(s)). Antimicrobials: Anti-infectives (From admission, onward)    None     Culture/Microbiology    Component Value Date/Time   SDES PLEURAL 07/18/2023 1108   SDES PLEURAL 07/18/2023 1108   SPECREQUEST NONE 07/18/2023 1108   SPECREQUEST NONE 07/18/2023 1108   CULT  07/18/2023  1108    NO GROWTH 5 DAYS Performed at [facility], [address], [place], [place] [address]    REPTSTATUS 07/23/2023 FINAL 07/18/2023 1108   REPTSTATUS 07/18/2023 FINAL 07/18/2023 1108   Radiology Studies: IR US CHEST  Result Date: 08/19/2023 CLINICAL DATA:  Possible tunneled pleural drainage catheter placement EXAM: CHEST ULTRASOUND COMPARISON:  Prior CT scan of the chest 08/16/2023 FINDINGS: Sonographic evaluation of the left thorax was performed in the procedural position. There is minimal pleural fluid. The majority of the pleural fluid is loculated posterior which does not allow for tunneled drainage catheter placement. IMPRESSION: Insufficient fluid to allow for percutaneous tunneled pleural catheter placement. Electronically Signed   By: Malachy Moan M.D.   On: 08/19/2023 08:57   VAS [place] LOWER EXTREMITY VENOUS (DVT)  Result Date: 08/18/2023  Lower Venous DVT Study Patient Name:  YENY HUKILL [facility]  Date of Exam:   08/18/2023 Medical Rec #: [id number]                  Accession #:    [id number] Date of Birth: 02/25/1966                  Patient Gender: F Patient Age:   49 years Exam Location:  [facility] Procedure:      VAS [place] LOWER EXTREMITY VENOUS (DVT) Referring Phys: RAKESH ALVA --------------------------------------------------------------------------------  Indications: Hypoxia.  Risk Factors: Metastatic breast cancer. Comparison Study: No previous exams Performing Technologist: Jody Hill RVT, RDMS  Examination Guidelines: A complete evaluation includes B-mode imaging, spectral Doppler, color Doppler, and power Doppler as needed of all accessible portions of each vessel. Bilateral testing is considered an integral part of a complete examination. Limited examinations for reoccurring indications may be performed as noted. The reflux portion of the exam is performed with the patient in reverse Trendelenburg.   +---------+---------------+---------+-----------+----------+--------------+ RIGHT    CompressibilityPhasicitySpontaneityPropertiesThrombus Aging +---------+---------------+---------+-----------+----------+--------------+ CFV      Full           Yes      Yes                                 +---------+---------------+---------+-----------+----------+--------------+ SFJ      Full                                                        +---------+---------------+---------+-----------+----------+--------------+  FV Prox  Full           Yes      Yes                                 +---------+---------------+---------+-----------+----------+--------------+ FV Mid   Full           Yes      Yes                                 +---------+---------------+---------+-----------+----------+--------------+ FV DistalFull           Yes      Yes                                 +---------+---------------+---------+-----------+----------+--------------+ PFV      Full                                                        +---------+---------------+---------+-----------+----------+--------------+ POP      Full           Yes      Yes                                 +---------+---------------+---------+-----------+----------+--------------+ PTV      Full                                                        +---------+---------------+---------+-----------+----------+--------------+ PERO     Full                                                        +---------+---------------+---------+-----------+----------+--------------+   +---------+---------------+---------+-----------+----------+--------------+ LEFT     CompressibilityPhasicitySpontaneityPropertiesThrombus Aging +---------+---------------+---------+-----------+----------+--------------+ CFV      Full           Yes      Yes                                  +---------+---------------+---------+-----------+----------+--------------+ SFJ      Full                                                        +---------+---------------+---------+-----------+----------+--------------+ FV Prox  Full           Yes      Yes                                 +---------+---------------+---------+-----------+----------+--------------+ FV Mid   Full  Yes      Yes                                 +---------+---------------+---------+-----------+----------+--------------+ FV DistalFull           Yes      Yes                                 +---------+---------------+---------+-----------+----------+--------------+ PFV      Full                                                        +---------+---------------+---------+-----------+----------+--------------+ POP      Full           Yes      Yes                                 +---------+---------------+---------+-----------+----------+--------------+ PTV      Full                                                        +---------+---------------+---------+-----------+----------+--------------+ PERO     Full                                                        +---------+---------------+---------+-----------+----------+--------------+     Summary: BILATERAL: - No evidence of deep vein thrombosis seen in the lower extremities, bilaterally. -No evidence of popliteal cyst, bilaterally.   *See table(s) above for measurements and observations. Electronically signed by Sherald Hess MD on 08/18/2023 at 4:38:49 PM.    Final      LOS: 11 days   Lanae Boast, MD [facility]  08/19/2023, 11:12 AM

## 2023-08-19 NOTE — Plan of Care (Signed)
Problem: Clinical Measurements: Goal: Respiratory complications will improve Outcome: Progressing   Problem: Pain Managment: Goal: General experience of comfort will improve Outcome: Progressing   Problem: Safety: Goal: Ability to remain free from injury will improve Outcome: Progressing

## 2023-08-19 NOTE — Progress Notes (Signed)
   08/19/23 2043  BiPAP/CPAP/SIPAP  BiPAP/CPAP/SIPAP Pt Type Adult  BiPAP/CPAP/SIPAP V60  Mask Type Full face mask  Mask Size Small  Set Rate 12 breaths/min  Respiratory Rate 26 breaths/min  IPAP 14 cmH20  EPAP 6 cmH2O  FiO2 (%) 90 %  Minute Ventilation 9.5  Leak 18  Peak Inspiratory Pressure (PIP) 15  Tidal Volume (Vt) 438  Patient Home Equipment No  Auto Titrate No  Press High Alarm 35 cmH2O  Press Low Alarm 5 cmH2O  BiPAP/CPAP /SiPAP Vitals  Pulse Rate 90  Resp (!) 26  SpO2 96 %  Bilateral Breath Sounds Diminished;Fine crackles  MEWS Score/Color  MEWS Score 4  MEWS Score Color Red

## 2023-08-19 NOTE — TOC Progression Note (Signed)
Transition of Care [facility]) - Progression Note    Patient Details  Name: Sherri Schultz MRN: [id number] Date of Birth: 1966-03-07  [facility] CM/SW Contact  Amada Jupiter, LCSW Phone Number: 08/19/2023, 2:54 PM  Clinical Narrative:     TOC continues to monitor chart daily for possible dc/ TOC needs.  Please order TOC if needs identified.       Expected Discharge Plan and Services                                               Social Determinants of Health (SDOH) Interventions SDOH Screenings   Food Insecurity: No Food Insecurity (08/16/2023)  Housing: Patient Declined (08/25/2023)  Transportation Needs: No Transportation Needs (08/13/2023)  Utilities: Not At Risk (08/07/2023)  Alcohol Screen: Low Risk  (07/31/2023)  Depression (PHQ2-9): Low Risk  (07/31/2023)  Financial Resource Strain: Low Risk  (02/19/2023)   [facility]  Physical Activity: Insufficiently Active (07/31/2023)  Social Connections: Socially Integrated (07/31/2023)  Stress: No Stress Concern Present (07/31/2023)  Tobacco Use: Low Risk  (08/04/2023)  Health Literacy: Adequate Health Literacy (07/31/2023)    Readmission Risk Interventions    08/03/2023    9:51 AM 07/31/2023    1:45 PM  Readmission Risk Prevention Plan  Post Dischage Appt Complete Complete  Medication Screening Complete Complete  Transportation Screening Complete Complete

## 2023-08-19 NOTE — Progress Notes (Signed)
START ON PATHWAY REGIMEN - Breast     A cycle is every 28 days (3 weeks on and 1 week off):     Paclitaxel   **Always confirm dose/schedule in your pharmacy ordering system**  Patient Characteristics: Distant Metastases or Locoregional Recurrent Disease - Unresected, M0 or Locally Advanced Unresectable Disease Progressing after Neoadjuvant and Local Therapies, M0, HER2 Low/Negative, ER Negative, Chemotherapy, HER2 Negative, First Line, PD-L1  Expression Negative/Unknown or Not a Candidate for Immunotherapy, and gBRCA Wildtype Therapeutic Status: Distant Metastases HER2 Status: Negative (-) ER Status: Negative (-) PR Status: Negative (-) Therapy Approach Indicated: Standard Chemotherapy/Endocrine Therapy Line of Therapy: First Line Intent of Therapy: Non-Curative / Palliative Intent, Discussed with Patient

## 2023-08-19 NOTE — Plan of Care (Signed)
  Problem: Coping: Goal: Level of anxiety will decrease Outcome: Progressing   Problem: Pain Managment: Goal: General experience of comfort will improve Outcome: Progressing   

## 2023-08-20 ENCOUNTER — Inpatient Hospital Stay (HOSPITAL_COMMUNITY): Payer: BC Managed Care – PPO

## 2023-08-20 ENCOUNTER — Other Ambulatory Visit: Payer: Self-pay

## 2023-08-20 ENCOUNTER — Encounter: Payer: Self-pay | Admitting: Hematology and Oncology

## 2023-08-20 DIAGNOSIS — R4589 Other symptoms and signs involving emotional state: Secondary | ICD-10-CM

## 2023-08-20 DIAGNOSIS — J910 Malignant pleural effusion: Secondary | ICD-10-CM | POA: Diagnosis not present

## 2023-08-20 DIAGNOSIS — C50919 Malignant neoplasm of unspecified site of unspecified female breast: Secondary | ICD-10-CM | POA: Diagnosis not present

## 2023-08-20 DIAGNOSIS — C7802 Secondary malignant neoplasm of left lung: Secondary | ICD-10-CM

## 2023-08-20 DIAGNOSIS — C7800 Secondary malignant neoplasm of unspecified lung: Secondary | ICD-10-CM | POA: Diagnosis not present

## 2023-08-20 DIAGNOSIS — Z66 Do not resuscitate: Secondary | ICD-10-CM

## 2023-08-20 DIAGNOSIS — C799 Secondary malignant neoplasm of unspecified site: Secondary | ICD-10-CM

## 2023-08-20 DIAGNOSIS — R0603 Acute respiratory distress: Secondary | ICD-10-CM | POA: Diagnosis not present

## 2023-08-20 DIAGNOSIS — Z7189 Other specified counseling: Secondary | ICD-10-CM

## 2023-08-20 DIAGNOSIS — Z711 Person with feared health complaint in whom no diagnosis is made: Secondary | ICD-10-CM

## 2023-08-20 DIAGNOSIS — C7801 Secondary malignant neoplasm of right lung: Secondary | ICD-10-CM

## 2023-08-20 DIAGNOSIS — J90 Pleural effusion, not elsewhere classified: Secondary | ICD-10-CM | POA: Diagnosis not present

## 2023-08-20 DIAGNOSIS — J9601 Acute respiratory failure with hypoxia: Secondary | ICD-10-CM | POA: Diagnosis not present

## 2023-08-20 DIAGNOSIS — Z515 Encounter for palliative care: Secondary | ICD-10-CM | POA: Diagnosis not present

## 2023-08-20 DIAGNOSIS — Z79899 Other long term (current) drug therapy: Secondary | ICD-10-CM

## 2023-08-20 LAB — COMPREHENSIVE METABOLIC PANEL
ALT: 17 U/L (ref 0–44)
AST: 56 U/L — ABNORMAL HIGH (ref 15–41)
Albumin: 2.3 g/dL — ABNORMAL LOW (ref 3.5–5.0)
Alkaline Phosphatase: 150 U/L — ABNORMAL HIGH (ref 38–126)
Anion gap: 17 — ABNORMAL HIGH (ref 5–15)
BUN: 42 mg/dL — ABNORMAL HIGH (ref 6–20)
CO2: 26 mmol/L (ref 22–32)
Calcium: 8.4 mg/dL — ABNORMAL LOW (ref 8.9–10.3)
Chloride: 92 mmol/L — ABNORMAL LOW (ref 98–111)
Creatinine, Ser: 0.66 mg/dL (ref 0.44–1.00)
GFR, Estimated: >60 mL/min (ref >60)
Glucose, Bld: 121 mg/dL — ABNORMAL HIGH (ref 70–99)
Potassium: 3.1 mmol/L — ABNORMAL LOW (ref 3.5–5.1)
Sodium: 135 mmol/L (ref 135–145)
Total Bilirubin: 1.5 mg/dL — ABNORMAL HIGH (ref 0.3–1.2)
Total Protein: 6.2 g/dL — ABNORMAL LOW (ref 6.5–8.1)

## 2023-08-20 LAB — CBC
HCT: 41.1 % (ref 36.0–46.0)
Hemoglobin: 13.1 g/dL (ref 12.0–15.0)
MCH: 29.2 pg (ref 26.0–34.0)
MCHC: 31.9 g/dL (ref 30.0–36.0)
MCV: 91.5 fL (ref 80.0–100.0)
Platelets: 97 10*3/uL — ABNORMAL LOW (ref 150–400)
RBC: 4.49 MIL/uL (ref 3.87–5.11)
RDW: 12.6 % (ref 11.5–15.5)
WBC: 15.2 10*3/uL — ABNORMAL HIGH (ref 4.0–10.5)
nRBC: 0.2 % (ref 0.0–0.2)

## 2023-08-20 LAB — BASIC METABOLIC PANEL
Anion gap: 19 — ABNORMAL HIGH (ref 5–15)
BUN: 39 mg/dL — ABNORMAL HIGH (ref 6–20)
CO2: 28 mmol/L (ref 22–32)
Calcium: 9 mg/dL (ref 8.9–10.3)
Chloride: 87 mmol/L — ABNORMAL LOW (ref 98–111)
Creatinine, Ser: 0.69 mg/dL (ref 0.44–1.00)
GFR, Estimated: >60 mL/min (ref >60)
Glucose, Bld: 109 mg/dL — ABNORMAL HIGH (ref 70–99)
Potassium: 3.5 mmol/L (ref 3.5–5.1)
Sodium: 134 mmol/L — ABNORMAL LOW (ref 135–145)

## 2023-08-20 LAB — BLOOD GAS, ARTERIAL
Acid-Base Excess: 3 mmol/L — ABNORMAL HIGH (ref 0.0–2.0)
Bicarbonate: 29.8 mmol/L — ABNORMAL HIGH (ref 20.0–28.0)
Drawn by: 29503
FIO2: 100 %
MECHVT: 430 mL
O2 Saturation: 98 %
PEEP: 10 cmH2O
Patient temperature: 37
RATE: 20 resp/min
pCO2 arterial: 54 mmHg — ABNORMAL HIGH (ref 32–48)
pH, Arterial: 7.35 (ref 7.35–7.45)
pO2, Arterial: 83 mmHg (ref 83–108)

## 2023-08-20 LAB — GLUCOSE, CAPILLARY: Glucose-Capillary: 170 mg/dL — ABNORMAL HIGH (ref 70–99)

## 2023-08-20 LAB — HEMOGLOBIN A1C
Hgb A1c MFr Bld: 6.5 % — ABNORMAL HIGH (ref 4.8–5.6)
Mean Plasma Glucose: 139.85 mg/dL

## 2023-08-20 MED ORDER — ORAL CARE MOUTH RINSE: 15.0000 mL | OROMUCOSAL | Status: DC | PRN

## 2023-08-20 MED ORDER — POLYETHYLENE GLYCOL 3350 17 G PO PACK: 17.0000 g | PACK | Freq: Two times a day (BID) | ORAL | Status: DC | PRN

## 2023-08-20 MED ORDER — HYDROXYZINE HCL 50 MG/ML IM SOLN: 25.0000 mg | Freq: Four times a day (QID) | INTRAMUSCULAR | Status: DC | PRN

## 2023-08-20 MED ORDER — PANTOPRAZOLE SODIUM 40 MG PO TBEC
40.0000 mg | DELAYED_RELEASE_TABLET | Freq: Every day | ORAL | Status: DC
Start: 2023-08-20 — End: 2023-08-20

## 2023-08-20 MED ORDER — ONDANSETRON HCL 4 MG PO TABS: 4.0000 mg | ORAL_TABLET | Freq: Four times a day (QID) | ORAL | Status: DC | PRN

## 2023-08-20 MED ORDER — SODIUM CHLORIDE 0.9 % IV SOLN
80.0000 mg/m2 | Freq: Once | INTRAVENOUS | Status: DC
  Filled 2023-08-20: qty 24

## 2023-08-20 MED ORDER — FENTANYL CITRATE PF 50 MCG/ML IJ SOSY
50.0000 ug | PREFILLED_SYRINGE | Freq: Once | INTRAMUSCULAR | Status: AC
  Administered 2023-08-20: 50 ug via INTRAVENOUS

## 2023-08-20 MED ORDER — TIZANIDINE HCL 4 MG PO TABS: 4.0000 mg | ORAL_TABLET | Freq: Every evening | ORAL | Status: DC | PRN

## 2023-08-20 MED ORDER — ETOMIDATE 2 MG/ML IV SOLN
INTRAVENOUS | Status: AC
  Administered 2023-08-20: 20 mg
  Filled 2023-08-20: qty 20

## 2023-08-20 MED ORDER — POLYVINYL ALCOHOL 1.4 % OP SOLN: 1.0000 [drp] | Freq: Four times a day (QID) | OPHTHALMIC | Status: DC | PRN

## 2023-08-20 MED ORDER — FAMOTIDINE IN NACL 20-0.9 MG/50ML-% IV SOLN
20.0000 mg | Freq: Once | INTRAVENOUS | Status: AC
  Administered 2023-08-20: 20 mg via INTRAVENOUS
  Filled 2023-08-20: qty 50

## 2023-08-20 MED ORDER — GUAIFENESIN 100 MG/5ML PO LIQD: 5.0000 mL | ORAL | Status: DC | PRN

## 2023-08-20 MED ORDER — POLYETHYLENE GLYCOL 3350 17 G PO PACK
17.0000 g | PACK | Freq: Every day | ORAL | Status: DC
  Filled 2023-08-20: qty 1

## 2023-08-20 MED ORDER — OXYCODONE HCL 5 MG PO TABS
5.0000 mg | ORAL_TABLET | Freq: Four times a day (QID) | ORAL | Status: DC
  Administered 2023-08-20: 5 mg
  Filled 2023-08-20 (×2): qty 1

## 2023-08-20 MED ORDER — ORAL CARE MOUTH RINSE
15.0000 mL | OROMUCOSAL | Status: DC
  Administered 2023-08-20: 15 mL via OROMUCOSAL

## 2023-08-20 MED ORDER — GLYCOPYRROLATE 0.2 MG/ML IJ SOLN
0.2000 mg | INTRAMUSCULAR | Status: DC | PRN
  Administered 2023-08-20: 0.2 mg via INTRAVENOUS
  Filled 2023-08-20: qty 1

## 2023-08-20 MED ORDER — MIDAZOLAM HCL 2 MG/2ML IJ SOLN
INTRAMUSCULAR | Status: AC
  Administered 2023-08-20: 2 mg
  Filled 2023-08-20: qty 2

## 2023-08-20 MED ORDER — ONDANSETRON HCL 4 MG/2ML IJ SOLN: 4.0000 mg | Freq: Four times a day (QID) | INTRAMUSCULAR | Status: DC | PRN

## 2023-08-20 MED ORDER — MIDAZOLAM BOLUS VIA INFUSION: 2.0000 mg | INTRAVENOUS | Status: DC | PRN

## 2023-08-20 MED ORDER — SODIUM CHLORIDE 0.9 % IV SOLN
10.0000 mg | Freq: Once | INTRAVENOUS | Status: AC
  Administered 2023-08-20: 10 mg via INTRAVENOUS
  Filled 2023-08-20: qty 1

## 2023-08-20 MED ORDER — HYDROXYZINE HCL 25 MG PO TABS: 25.0000 mg | ORAL_TABLET | Freq: Four times a day (QID) | ORAL | Status: DC | PRN

## 2023-08-20 MED ORDER — INSULIN ASPART 100 UNIT/ML IJ SOLN
0.0000 [IU] | INTRAMUSCULAR | Status: DC
  Administered 2023-08-20: 3 [IU] via SUBCUTANEOUS

## 2023-08-20 MED ORDER — ACETAMINOPHEN 160 MG/5ML PO SOLN: 650.0000 mg | Freq: Four times a day (QID) | ORAL | Status: DC | PRN

## 2023-08-20 MED ORDER — ACETAMINOPHEN 650 MG RE SUPP: 650.0000 mg | RECTAL | Status: DC | PRN

## 2023-08-20 MED ORDER — BIOTENE DRY MOUTH MT LIQD: 15.0000 mL | OROMUCOSAL | Status: DC | PRN

## 2023-08-20 MED ORDER — DIPHENHYDRAMINE HCL 50 MG/ML IJ SOLN
50.0000 mg | Freq: Once | INTRAMUSCULAR | Status: AC
  Administered 2023-08-20: 50 mg via INTRAVENOUS
  Filled 2023-08-20: qty 1

## 2023-08-20 MED ORDER — NOREPINEPHRINE 4 MG/250ML-% IV SOLN
INTRAVENOUS | Status: AC
  Administered 2023-08-20: 4 mg via INTRAVENOUS
  Filled 2023-08-20: qty 250

## 2023-08-20 MED ORDER — HYDROCODONE BIT-HOMATROP MBR 5-1.5 MG/5ML PO SOLN: 5.0000 mL | Freq: Four times a day (QID) | ORAL | Status: DC | PRN

## 2023-08-20 MED ORDER — FENTANYL BOLUS VIA INFUSION
50.0000 ug | INTRAVENOUS | Status: DC | PRN
  Administered 2023-08-20 (×4): 50 ug via INTRAVENOUS

## 2023-08-20 MED ORDER — SODIUM CHLORIDE 0.9 % IV SOLN: INTRAVENOUS | Status: DC

## 2023-08-20 MED ORDER — FENTANYL CITRATE PF 50 MCG/ML IJ SOSY
PREFILLED_SYRINGE | INTRAMUSCULAR | Status: AC
  Administered 2023-08-20: 100 ug
  Filled 2023-08-20: qty 2

## 2023-08-20 MED ORDER — MIDAZOLAM HCL 2 MG/2ML IJ SOLN
1.0000 mg | INTRAMUSCULAR | Status: DC | PRN
  Administered 2023-08-20 (×2): 2 mg via INTRAVENOUS
  Filled 2023-08-20: qty 2

## 2023-08-20 MED ORDER — PROMETHAZINE HCL 6.25 MG/5ML PO SOLN: 12.5000 mg | Freq: Four times a day (QID) | ORAL | Status: DC | PRN

## 2023-08-20 MED ORDER — PANTOPRAZOLE SODIUM 40 MG IV SOLR: 40.0000 mg | INTRAVENOUS | Status: DC

## 2023-08-20 MED ORDER — ALPRAZOLAM 0.5 MG PO TABS
0.5000 mg | ORAL_TABLET | Freq: Two times a day (BID) | ORAL | Status: DC
  Administered 2023-08-20: 0.5 mg
  Filled 2023-08-20: qty 1

## 2023-08-20 MED ORDER — DOCUSATE SODIUM 50 MG/5ML PO LIQD
100.0000 mg | Freq: Two times a day (BID) | ORAL | Status: DC
  Administered 2023-08-20: 100 mg
  Filled 2023-08-20: qty 10

## 2023-08-20 MED ORDER — PHENYLEPHRINE 80 MCG/ML (10ML) SYRINGE FOR IV PUSH (FOR BLOOD PRESSURE SUPPORT)
PREFILLED_SYRINGE | INTRAVENOUS | Status: AC
  Filled 2023-08-20: qty 10

## 2023-08-20 MED ORDER — HALOPERIDOL LACTATE 5 MG/ML IJ SOLN: 2.0000 mg | INTRAMUSCULAR | Status: DC | PRN

## 2023-08-20 MED ORDER — FENTANYL 2500MCG IN NS 250ML (10MCG/ML) PREMIX INFUSION
50.0000 ug/h | INTRAVENOUS | Status: DC
  Administered 2023-08-20: 150 ug/h via INTRAVENOUS
  Filled 2023-08-20: qty 250

## 2023-08-20 MED ORDER — SENNOSIDES 8.8 MG/5ML PO SYRP
5.0000 mL | ORAL_SOLUTION | Freq: Every day | ORAL | Status: DC
  Administered 2023-08-20: 5 mL
  Filled 2023-08-20: qty 5

## 2023-08-20 MED ORDER — ROCURONIUM BROMIDE 10 MG/ML (PF) SYRINGE
PREFILLED_SYRINGE | INTRAVENOUS | Status: AC
  Administered 2023-08-20: 50 mg
  Filled 2023-08-20: qty 10

## 2023-08-20 MED ORDER — POTASSIUM CHLORIDE CRYS ER 20 MEQ PO TBCR: 40.0000 meq | EXTENDED_RELEASE_TABLET | Freq: Two times a day (BID) | ORAL | Status: DC

## 2023-08-20 MED ORDER — SODIUM CHLORIDE 0.9 % IV SOLN
250.0000 mL | INTRAVENOUS | Status: DC
  Administered 2023-08-20: 250 mL via INTRAVENOUS

## 2023-08-20 MED ORDER — MIDAZOLAM-SODIUM CHLORIDE 100-0.9 MG/100ML-% IV SOLN
0.5000 mg/h | INTRAVENOUS | Status: DC
  Administered 2023-08-20: 0.5 mg/h via INTRAVENOUS
  Filled 2023-08-20: qty 100

## 2023-08-20 MED ORDER — FENTANYL CITRATE (PF) 100 MCG/2ML IJ SOLN
INTRAMUSCULAR | Status: AC
  Administered 2023-08-20: 100 ug
  Filled 2023-08-20: qty 2

## 2023-08-20 MED ORDER — LACTULOSE 10 GM/15ML PO SOLN: 20.0000 g | Freq: Two times a day (BID) | ORAL | Status: DC | PRN

## 2023-08-20 MED ORDER — POTASSIUM CHLORIDE 20 MEQ PO PACK
40.0000 meq | PACK | Freq: Two times a day (BID) | ORAL | Status: DC
  Administered 2023-08-20: 40 meq
  Filled 2023-08-20: qty 2

## 2023-08-20 MED ORDER — NOREPINEPHRINE 4 MG/250ML-% IV SOLN: 2.0000 ug/min | INTRAVENOUS | Status: DC

## 2023-08-26 NOTE — Progress Notes (Signed)
[facility] PROGRESS NOTE    Progress Note  Sherri Schultz  ONG:[id number] DOB: 1966/02/04 DOA: 2023-08-23 PCP: Elizabeth Palau, FNP     Brief Narrative:   Sherri Schultz is an 57 y.o. 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 Vincente Poli oncology was consulted, 08/18/2023 lower extremity Doppler was negative for DVT.    Assessment/Plan:   Acute on chronic respiratory failure with hypoxia due to metastatic breast cancer with lymphangitic spread and recurrent malignant left-sided effusion: Status post thoracocentesis on admission on 2023/08/23 felt better after thoracocentesis, there was not enough fluid for Pleurx catheter.   CT of the chest showed moderate multiloculated left pleural effusion decreased in size compared to previous with bilateral airspace disease unchanged left anterior mass 2 x 2 cm. Pulmonary was consulted as she became worse several days after thoracocentesis she was started on a trial of steroids and Lasix.  With no improvement. There is a concern about lymphangitic spread so oncology was consulted and awaiting FISH resul. Pulmonary and critical care following and appreciate assistance. Oncology was consulted and she was started on chemotherapy on day 1 day 8-day 15 followed by a week off, due to her worsening hypoxia.  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 She completed her radiation treatment in September. She relates her pain is better currently on oxycodone Atarax and tizanidine.  Severe anxiety: On Xanax for anxiety.  Electrolyte imbalance hyponatremia hypokalemia: Still hypovolemic continue IV fluids, potassium is 3.5. Continue IV fluids replete orally recheck in the morning.  Anemia of malignancy: Hemoglobin is stable.  Leukocytosis: Currently afebrile likely due to steroids plus or minus malignancy.  GERD: Continue PPI.  Anxiety disorder: Continue Effexor Inderal and Atarax.  Tachycardia Likely related to malignancy. Check a 2D echo.    DVT prophylaxis: lovenox Family Communication:none Status is: Inpatient Remains inpatient appropriate because: Acute respiratory failure with hypoxia likely due to lymphangitic spread    Code Status:     Code Status Orders  (From admission, onward)           Start     Ordered   2023/08/23 1638  Full code  Continuous       Question:  By:  Answer:  Consent: discussion documented in EHR   08-23-2023 1637           Code Status History     Date Active Date Inactive Code Status Order ID Comments User Context   07/29/2023 1548 08/03/2023 2020 Full Code [id number]  Hughie Closs, MD ED         IV Access:   Peripheral IV   Procedures and diagnostic studies:   IR US CHEST  Result Date: 08/19/2023 CLINICAL DATA:  Possible tunneled pleural drainage catheter placement EXAM: CHEST ULTRASOUND COMPARISON:  Prior CT scan of the chest 08/16/2023 FINDINGS: Sonographic evaluation of the left thorax was performed in the procedural position. There is minimal pleural fluid.  The majority of the pleural fluid is loculated posterior which does not allow for tunneled drainage catheter placement. IMPRESSION: Insufficient fluid to allow for percutaneous tunneled pleural catheter  placement. Electronically Signed   By: Malachy Moan M.D.   On: 08/19/2023 08:57   VAS [place] LOWER EXTREMITY VENOUS (DVT)  Result Date: 08/18/2023  Lower Venous DVT Study Patient Name:  Sherri Schultz [facility]  Date of Exam:   08/18/2023 Medical Rec #: [id number]                  Accession #:    [id number] Date of Birth: June 26, 1966                  Patient Gender: F Patient Age:   56 years Exam Location:  [facility] Procedure:      VAS [place] LOWER EXTREMITY VENOUS (DVT) Referring Phys: RAKESH ALVA --------------------------------------------------------------------------------  Indications: Hypoxia.  Risk Factors: Metastatic breast cancer. Comparison Study: No previous exams Performing Technologist: Jody Hill RVT, RDMS  Examination Guidelines: A complete evaluation includes B-mode imaging, spectral Doppler, color Doppler, and power Doppler as needed of all accessible portions of each vessel. Bilateral testing is considered an integral part of a complete examination. Limited examinations for reoccurring indications may be performed as noted. The reflux portion of the exam is performed with the patient in reverse Trendelenburg.  +---------+---------------+---------+-----------+----------+--------------+ RIGHT    CompressibilityPhasicitySpontaneityPropertiesThrombus Aging +---------+---------------+---------+-----------+----------+--------------+ CFV      Full           Yes      Yes                                 +---------+---------------+---------+-----------+----------+--------------+ SFJ      Full                                                        +---------+---------------+---------+-----------+----------+--------------+ FV Prox  Full           Yes      Yes                                 +---------+---------------+---------+-----------+----------+--------------+ FV Mid   Full           Yes      Yes                                  +---------+---------------+---------+-----------+----------+--------------+ FV DistalFull           Yes      Yes                                 +---------+---------------+---------+-----------+----------+--------------+ PFV      Full                                                        +---------+---------------+---------+-----------+----------+--------------+ POP      Full  Yes      Yes                                 +---------+---------------+---------+-----------+----------+--------------+ PTV      Full                                                        +---------+---------------+---------+-----------+----------+--------------+ PERO     Full                                                        +---------+---------------+---------+-----------+----------+--------------+   +---------+---------------+---------+-----------+----------+--------------+ LEFT     CompressibilityPhasicitySpontaneityPropertiesThrombus Aging +---------+---------------+---------+-----------+----------+--------------+ CFV      Full           Yes      Yes                                 +---------+---------------+---------+-----------+----------+--------------+ SFJ      Full                                                        +---------+---------------+---------+-----------+----------+--------------+ FV Prox  Full           Yes      Yes                                 +---------+---------------+---------+-----------+----------+--------------+ FV Mid   Full           Yes      Yes                                 +---------+---------------+---------+-----------+----------+--------------+ FV DistalFull           Yes      Yes                                 +---------+---------------+---------+-----------+----------+--------------+ PFV      Full                                                         +---------+---------------+---------+-----------+----------+--------------+ POP      Full           Yes      Yes                                 +---------+---------------+---------+-----------+----------+--------------+ PTV      Full                                                        +---------+---------------+---------+-----------+----------+--------------+  PERO     Full                                                        +---------+---------------+---------+-----------+----------+--------------+     Summary: BILATERAL: - No evidence of deep vein thrombosis seen in the lower extremities, bilaterally. -No evidence of popliteal cyst, bilaterally.   *See table(s) above for measurements and observations. Electronically signed by Sherald Hess MD on 08/18/2023 at 4:38:49 PM.    Final      Medical Consultants:   None.   Subjective:    Jiali Morning Pasch[place] she relates her breathing is unchanged.  Objective:    Vitals:   2023/09/12 0200 12-Sep-2023 0300 09/12/2023 0347 September 12, 2023 0400  BP:   122/79 119/77  Pulse:   97 96  Resp: (!) 22 (!) 24 (!) 26 (!) 22  Temp:   97.8 F (36.6 C)   TempSrc:   Axillary   SpO2:   94% 93%  Weight:      Height:       SpO2: 93 % O2 Flow Rate (L/min): 50 L/min FiO2 (%): 90 %   Intake/Output Summary (Last 24 hours) at 12-Sep-2023 0647 Last data filed at 08/19/2023 2100 Gross per 24 hour  Intake 50 ml  Output 525 ml  Net -475 ml   Filed Weights   08/21/2023 1245 08/09/23 0340 08/10/23 1930  Weight: 78.4 kg 73.1 kg 72.2 kg    Exam: General exam: In no acute distress. Respiratory system: Good air movement and diffuse crackles bilaterally Cardiovascular system: S1 & S2 heard, RRR. No JVD. Gastrointestinal system: Abdomen is nondistended, soft and nontender.  Extremities: No pedal edema. Skin: No rashes, lesions or ulcers Psychiatry: Judgement and insight appear normal. Mood & affect appropriate.    Data Reviewed:     Labs: Basic Metabolic Panel: Recent Labs  Lab 08/16/23 0313 08/17/23 0043 08/18/23 0314 08/19/23 0309 09/12/2023 0254  NA 135 133* 134* 134* 134*  K 3.5 3.8 3.4* 3.4* 3.5  CL 88* 84* 86* 87* 87*  CO2 32 32 32 28 28  GLUCOSE 118* 112* 122* 105* 109*  BUN 27* 27* 33* 36* 39*  CREATININE 0.83 0.80 0.91 0.81 0.69  CALCIUM 9.3 9.2 9.3 9.0 9.0   GFR Estimated Creatinine Clearance: 77.4 mL/min (by C-G formula based on SCr of 0.69 mg/dL). Liver Function Tests: No results for input(s): "AST", "ALT", "ALKPHOS", "BILITOT", "PROT", "ALBUMIN" in the last 168 hours. No results for input(s): "LIPASE", "AMYLASE" in the last 168 hours. No results for input(s): "AMMONIA" in the last 168 hours. Coagulation profile Recent Labs  Lab 08/18/23 0314  INR 1.2   COVID-19 Labs  No results for input(s): "DDIMER", "FERRITIN", "LDH", "CRP" in the last 72 hours.  Lab Results  Component Value Date   SARSCOV2NAA NEGATIVE 08/19/2023   SARSCOV2NAA NEGATIVE 07/29/2023   SARSCOV2NAA NEGATIVE 02/11/2020    CBC: Recent Labs  Lab 08/16/23 0313 08/17/23 0043 08/18/23 0314 08/19/23 0309 09-12-23 0254  WBC 14.3* 16.9* 15.8* 15.2* 15.2*  HGB 12.5 12.6 13.0 12.6 13.1  HCT 39.3 40.1 41.4 39.5 41.1  MCV 94.9 92.6 94.5 93.8 91.5  PLT 291 233 192 145* 97*   Cardiac Enzymes: No results for input(s): "CKTOTAL", "CKMB", "CKMBINDEX", "TROPONINI" in the last 168 hours. BNP (last 3 results) No results for input(s): "  PROBNP" in the last 8760 hours. CBG: No results for input(s): "GLUCAP" in the last 168 hours. D-Dimer: No results for input(s): "DDIMER" in the last 72 hours. Hgb A1c: No results for input(s): "HGBA1C" in the last 72 hours. Lipid Profile: No results for input(s): "CHOL", "HDL", "LDLCALC", "TRIG", "CHOLHDL", "LDLDIRECT" in the last 72 hours. Thyroid function studies: No results for input(s): "TSH", "T4TOTAL", "T3FREE", "THYROIDAB" in the last 72 hours.  Invalid input(s):  "FREET3" Anemia work up: No results for input(s): "VITAMINB12", "FOLATE", "FERRITIN", "TIBC", "IRON", "RETICCTPCT" in the last 72 hours. Sepsis Labs: Recent Labs  Lab 08/17/23 0043 08/18/23 0314 08/19/23 0309 08/18/2023 0254  PROCALCITON  --  0.15  --   --   WBC 16.9* 15.8* 15.2* 15.2*   Microbiology No results found for this or any previous visit (from the past 240 hour(s)).   Medications:    ALPRAZolam  0.5 mg Oral BID   Chlorhexidine Gluconate Cloth  6 each Topical Q0600   sennosides  5 mL Oral Daily   And   docusate  50 mg Oral Daily   furosemide  40 mg Intravenous Daily   heparin injection (subcutaneous)  5,000 Units Subcutaneous Q8H   influenza vac split trivalent PF  0.5 mL Intramuscular Tomorrow-1000   ipratropium-albuterol  3 mL Nebulization BID   lidocaine  30 mL Intradermal Once   methylPREDNISolone (SOLU-MEDROL) injection  40 mg Intravenous Daily   mouth rinse  15 mL Mouth Rinse 4 times per day   pantoprazole (PROTONIX) IV  40 mg Intravenous Q24H   propranolol ER  80 mg Oral Daily   silver sulfADIAZINE   Topical Daily   venlafaxine XR  150 mg Oral Daily   Continuous Infusions:  sodium chloride Stopped (24-Aug-2023 0642)      LOS: 12 days   Marinda Elk  [facility]  08/04/2023, 6:47 AM

## 2023-08-26 NOTE — Consult Note (Signed)
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 During patient's admission patient has received medical management for recurrent left pleural effusion s/p thoracentesis on 09/03/23 with removal of 700cc though this re accumulated. Patient's respiratory status continued to deteriorate and she required intubation on 9/25. PCCM assisting with management of care. Palliative medicine consulted to assist with complex medical decision making.   Extensive review of EMR prior to presenting to bedside. When presenting to bedside, patient's respiratory status had already worsened requiring intubation around 9AM today. Discussed care with RN for medical updates.   Patient's brother and sister-in-law present waiting outside of the room since patient intubated and receiving medical care. Able to speak to them privately and introduce myself as a member of the palliative medicine team and my role in patient's medical care. Patient's brother, Lorin Picket, noted that patient's husband is currently on route to the hospital. Besides patient's husband, Lorin Picket is really the only family patient has left. Spent time learning about patient's medical journey up to this point. Learned about patient's rapid cancer progression and deteriorating medical status, specifically over the past 3 weeks. Scott described how patient was trying to "fight" this though she and family had heard from  oncologist that patient's cancer was aggressive and could not be cured. Scott noted they heard that chemo would likely not change patient's overall prognosis, she just wanted to try. Spent time providing emotional support via active listening. Lorin Picket also described that their mother died from cancer as well though it was not the same kind as the patient's.   With permission, able to discuss concerns about patient's deteriorating medical status. Discussed concern that patient required intubation which is a sign that despite her fighting spirit, her body is failing. Discussed how patient's cancer will continue to progress and even with chemotherapy, that takes time and will not immediately improved her breathing if it ever will. Family acknowledged this and that they figured this was the case. Noted would return to discuss care with patient's husband once he arrived. Patient's brother and sister-in-law expressed appreciation for discussion and support.   Returned later in afternoon once informed patient's husband had arrived to bedside.  With permission, able to engage in conversation at bedside with patient's husband, patient's stepson (husband's from first marriage), patient's brother, and patient's sister-in-law.  Introduced myself as a member of the palliative medicine team.  Spent time discussing patient's medical journey up into this point.  Patient's husband acknowledged that patient has rapidly deteriorated within the past few weeks.  Husband noted that though they had discussed patient "getting better" they had also talked about not wanting the patient to suffer like she saw her mother due to at the end of life with cancer.  Now seeing patient intubated, husband knows the patient would not want this and is suffering.  Husband spent time expressing what a wonderful wife the patient has been.  Husband feels truly blast to have met her.  All family members present at bedside spent time expressing memories of  outpatient being such a loving and caring person she will who met her.  Spent time providing emotional support via active listening. With permission, able to discuss pathways for medical care moving forward.  Spent time explaining that chemotherapy that had been previously discussed, we will not quickly reverse patient's underlying medical condition if it will assist at all.  Patient would enter adverse effects of chemotherapy.  All family present at bedside including patient's husband noted that they have seen multiple family members deal with the adverse effects from chemotherapy and that is true torture they would not want the patient to go through.  With permission and then discussed alternative pathway of focusing on comfort focused care at the end of the life.  Has been noted that he and patient had actually previously discussed that she would want no suffering at the end of life.  Discussed providing medications for comfort and to allow a palliative extubation once patient was comfortable so she could pass away comfortably with dignity.  All family present specifically patient's husband agreeing with transitioning to comfort focused care at this time.  Again emphasized that patient would not receive chemotherapy and would only receive medications focused on comfort care at the end of life.  Again family acknowledges and expressed wishes to transition to full comfort focused care.  Once patient is completely comfortable, palliative extubation will be performed and in-hospital death is anticipated.    Answered all questions as able at that time.  Noted palliative medicine team will continue to follow along with patient's medical journey.  Updated IDT including PCCM provider and oncologist that patient has been transition to full comfort focused care.  Primary Diagnoses  Present on Admission:  Acute respiratory distress   Past Medical History:  Diagnosis Date   Anxiety    Breast cancer (HCC) 2021    left breast DCIS   Cancer (HCC) 2013   right knee   Dyspnea    Family history of brain cancer    Family history of breast cancer    Family history of colon cancer    Family history of melanoma    GERD (gastroesophageal reflux disease)    Headache    Hypertension    Personal history of radiation therapy    Sleep apnea    Trimalleolar fracture of ankle, closed, left, initial encounter    Social History   Socioeconomic History   Marital status: Single    Spouse name: Not on file   Number of children: Not on file   Years of education: Not on file   Highest education level: Not on file  Occupational History   Not on file  Tobacco Use   Smoking status: Never   Smokeless tobacco: Never  Vaping Use   Vaping status: Never Used  Substance and Sexual Activity   Alcohol use: Never   Drug use: Never   Sexual activity: Not on file  Other Topics Concern   Not on file  Social History Narrative   Not on file   Social Determinants of Health   Financial Resource Strain: Low Risk  (02/19/2023)   Received from [facility]   Overall Financial Resource Strain (CARDIA)    Difficulty of Paying Living Expenses: Not hard at all  Food Insecurity: No Food Insecurity (August 24, 2023)   Hunger Vital Sign    Worried About Running Out of Food in the Last Year: Never true  Ran Out of Food in the Last Year: Never true  Transportation Needs: No Transportation Needs (08-22-23)   PRAPARE - Administrator, Civil Service (Medical): No    Lack of Transportation (Non-Medical): No  Physical Activity: Insufficiently Active (07/31/2023)   Exercise Vital Sign    Days of Exercise per Week: 2 days    Minutes of Exercise per Session: 20 min  Stress: No Stress Concern Present (07/31/2023)   [employer] of Occupational Health - Occupational Stress Questionnaire    Feeling of Stress : Only a little  Social Connections: Socially Integrated (07/31/2023)   Social Connection and  Isolation Panel [NHANES]    Frequency of Communication with Friends and Family: More than three times a week    Frequency of Social Gatherings with Friends and Family: Three times a week    Attends Religious Services: More than 4 times per year    Active Member of Clubs or Organizations: Yes    Attends Banker Meetings: 1 to 4 times per year    Marital Status: Married   Family History  Problem Relation Age of Onset   Breast cancer Mother 64   Diabetes Mother    Colon cancer Mother 77   Breast cancer Maternal Aunt        dx. >50   Diabetes Father    Hypertension Father    Heart attack Father    Arthritis Father    Diabetes Brother    Melanoma Brother 20   Diabetes Brother    Breast cancer Cousin        dx. 40s/50s   Brain cancer Maternal Uncle        dx. 20s   Breast cancer Maternal Aunt        dx. >50   Breast cancer Maternal Aunt        dx. >50   Cancer Maternal Uncle        unknown type, dx. >50   Breast cancer Cousin        dx. 40s/50s   Scheduled Meds:  ALPRAZolam  0.5 mg Oral BID   Chlorhexidine Gluconate Cloth  6 each Topical Q0600   docusate  100 mg Per Tube BID   etomidate       fentaNYL       fentaNYL       fentaNYL (SUBLIMAZE) injection  50 mcg Intravenous Once   furosemide  40 mg Intravenous Daily   heparin injection (subcutaneous)  5,000 Units Subcutaneous Q8H   influenza vac split trivalent PF  0.5 mL Intramuscular Tomorrow-1000   ipratropium-albuterol  3 mL Nebulization BID   lidocaine  30 mL Intradermal Once   methylPREDNISolone (SOLU-MEDROL) injection  40 mg Intravenous Daily   midazolam       mouth rinse  15 mL Mouth Rinse 4 times per day   pantoprazole (PROTONIX) IV  40 mg Intravenous Q24H   phenylephrine       polyethylene glycol  17 g Per Tube Daily   potassium chloride  40 mEq Oral BID   propranolol ER  80 mg Oral Daily   rocuronium bromide       sennosides  5 mL Oral Daily   silver sulfADIAZINE   Topical Daily   venlafaxine  XR  150 mg Oral Daily   Continuous Infusions:  sodium chloride Stopped (2023/08/22 0642)   sodium chloride     fentaNYL infusion INTRAVENOUS     PRN Meds:.sodium chloride, acetaminophen (TYLENOL) oral liquid  160 mg/5 mL **OR** acetaminophen, albuterol, bisacodyl, etomidate, fentaNYL, fentaNYL, fentaNYL, guaiFENesin, HYDROcodone bit-homatropine, hydrOXYzine **OR** hydrOXYzine, lactulose, menthol-cetylpyridinium, midazolam, midazolam, morphine injection, ondansetron **OR** ondansetron (ZOFRAN) IV, mouth rinse, oxyCODONE, phenylephrine, polyethylene glycol, promethazine, rocuronium bromide, sodium chloride, tiZANidine No Known Allergies CBC:    Component Value Date/Time   WBC 15.2 (H) 08/15/2023 0254   HGB 13.1 08/17/2023 0254   HGB 12.4 04/12/2022 0857   HCT 41.1 08/05/2023 0254   PLT 97 (L) 08/02/2023 0254   PLT 274 04/12/2022 0857   MCV 91.5 08/21/2023 0254   NEUTROABS 10.4 (H) 2023-08-21 1337   LYMPHSABS 0.7 08/21/23 1337   MONOABS 1.2 (H) 21-Aug-2023 1337   EOSABS 0.5 08-21-2023 1337   BASOSABS 0.1 08-21-23 1337   Comprehensive Metabolic Panel:    Component Value Date/Time   NA 134 (L) 08/22/2023 0254   K 3.5 08/16/2023 0254   CL 87 (L) 08/02/2023 0254   CO2 28 08/24/2023 0254   BUN 39 (H) 08/13/2023 0254   CREATININE 0.69 07/28/2023 0254   CREATININE 0.98 04/12/2022 0857   GLUCOSE 109 (H) 08/16/2023 0254   CALCIUM 9.0 08/06/2023 0254   AST 23 07/30/2023 0910   AST 15 04/12/2022 0857   ALT 12 07/30/2023 0910   ALT 13 04/12/2022 0857   ALKPHOS 79 07/30/2023 0910   BILITOT 0.2 (L) 07/30/2023 0910   BILITOT 0.4 04/12/2022 0857   PROT 6.3 (L) 07/30/2023 0910   ALBUMIN 2.9 (L) 07/30/2023 0910    Physical Exam: Vital Signs: BP 119/77 (BP Location: Right Arm)   Pulse (!) 114   Temp 99 F (37.2 C) (Axillary)   Resp (!) 30   Ht 5' 4.5" (1.638 m)   Wt 72.2 kg   LMP 03/27/2011   SpO2 90%   BMI 26.90 kg/m  SpO2: SpO2: 90 % O2 Device: O2 Device: Bi-PAP O2 Flow  Rate: O2 Flow Rate (L/min): 50 L/min Intake/output summary:  Intake/Output Summary (Last 24 hours) at 08/18/2023 0900 Last data filed at 07/28/2023 0800 Gross per 24 hour  Intake 50 ml  Output 525 ml  Net -475 ml   LBM: Last BM Date : 08/19/23 Baseline Weight: Weight: 78.4 kg Most recent weight: Weight: 72.2 kg  General: intubated, sedated, ill appearing  Eyes: no drainage noted HENT: ET tube in place Cardiovascular: Tachycardia noted Neuro: Intubated, sedated          Palliative Performance Scale: 10%              Additional Data Reviewed: Recent Labs    08/19/23 0309 07/27/2023 0254  WBC 15.2* 15.2*  HGB 12.6 13.1  PLT 145* 97*  NA 134* 134*  BUN 36* 39*  CREATININE 0.81 0.69    Imaging: [place] EKG SITE RITE If Site Rite image not attached, placement could not be confirmed due to  current cardiac rhythm.    I personally reviewed recent imaging.   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 During patient's admission patient has received medical management for recurrent left pleural effusion s/p thoracentesis on 08-21-2023 with removal of 700cc though this re accumulated. Patient's respiratory status continued to deteriorate and she required intubation on 9/25.  PCCM assisting with management of care. Palliative medicine consulted to assist with complex medical decision making.   # Complex medical decision making/goals of care  # Complex medical decision making/goals of care  -Patient unable to participate in medical decision making secondary medical status.   -Spoke with patient's husband, brother, sister-in-law, and stepson at  bedside as detailed above in HPI.  Husband noted that patient and family had actually been discussing priorities regarding patient's end-of-life care since she had so rapidly deteriorated.  Parity is that patient would not want to suffer as she saw her mother due to at the end of life while suffering from cancer.  Family's priority is for patient to be comfortable and did pass away peacefully.  Plan for palliative extubation once symptoms appropriately managed.  Transitioning to full comfort focused care at this time.   -At this time we will discontinue interventions that are no longer focused on comfort such as IV fluids, imaging, chemotherapy, or lab work.  Will instead focus on symptom management of pain, dyspnea, and agitation in the setting of end-of-life care.  - Code Status: DNR   # Symptom management    -Pain/Dyspnea, acute in the setting of end-of-life care                -Continue IV fentanyl infusion already in place, titrate based on symptom burden    -Start IV versed infusion and titrate to comfort with boluses ordered as needed.                 -Anxiety/agitation, in the setting of end-of-life care                               -Start IV Haldol 2 mg every 4 hours as needed. Continue to adjust based on patient's symptom burden.                   -Secretions, in the setting of end-of-life care                               -Start IV glycopyrrolate 0.2 mg every 4 hours as needed.  # Psycho-social/Spiritual Support:  - Support System:  patient's husband, brother, sister-in-law, and stepson  - Desire for further Chaplain support:yes  # Discharge Planning:  Anticipated Hospital Death  Thank you for allowing the palliative care team to participate in the care Fay Records Paschall Cohenour.  Alvester Morin, DO Palliative Care Provider PMT # [phone]  If patient remains symptomatic despite maximum doses, please call PMT at [phone] between 0700 and 1900. Outside of these hours,  please call attending, as PMT does not have night coverage.  This provider spent a total of 65 minutes providing patient's care.  Includes review of EMR, discussing care with other staff members involved in patient's medical care, obtaining relevant history and information from patient and/or patient's family, and personal review of imaging and lab work. Greater than 50% of the time was spent counseling and coordinating care related to the above assessment and plan.    *Please note that this is a verbal dictation therefore any spelling or grammatical errors are due to the "[employer] One" system interpretation.

## 2023-08-26 NOTE — Death Summary Note (Addendum)
DEATH SUMMARY   Patient Details  Name: Sherri Schultz MRN: [id number] DOB: 1966/08/07 RKY:[id number], Rosey Bath, FNP  Admission/Discharge Information   Admit Date:  2023-08-16  Date of Death:  28-Aug-2023   Time of Death:  1843  Length of Stay: 10-15-2023   Principle Cause of death: Acute respiratory failure Metastatic breast cancer  Hospital Diagnoses: Principal Problem:   Acute respiratory distress Active Problems:   Acute respiratory failure with hypoxia (HCC)   Malignant pleural effusion   Carcinoma of breast metastatic to multiple sites [facility])   Need for emotional support   Concern about end of life   DNR (do not resuscitate)   High risk medication use   Goals of care, counseling/discussion   Palliative care encounter  History of Present Illness:  57 year old woman with history malignant pleural effusion, chronic hypoxemia on 3 L nasal cannula at home presents with worsening dyspnea and hypoxemia. 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  Mediastinal lymphadenopathy status post EBUS 4R and station 7 showing malignant cells consistent with breast primary, ER/PR negative, HER2 pending   Contacted pulmonary office.  Has been trying to set up outpatient Pleurx.  Worsening symptoms but unable to get Pleurx done in short notice.  Advised to go to ED.  Chest x-ray showed loculated left pleural effusion o.  As well as scattered multifocal infiltrates consistent with parenchymal lung disease.Marland Kitchen  Underwent thoracentesis with IR 16-Aug-2023 with removal of 700 cc on left, improvement in hypoxemia.  From nonrebreather down to 4 L at time of evaluation.     Significant Hospital Events: Including procedures, antibiotic start and stop dates in addition to other pertinent events   Aug 16, 2023 Admitted to the hospital with worsening  dyspnea on exertion and hypoxemia with recurrent left pleural effusion, improvement in symptoms and hypoxemia after thoracentesis 9/16 Stable O2 requirements, 10L [place]. 9/17 Radiation today, tolerated well. O2 requirements stable. 9/28 oxygen requirements increased, heated high flow nasal cannula 9/21 CT chest > moderate multiloculated left pleural effusion with some decrease in size compared with priors.  Heterogeneous bilateral airspace disease, unchanged.  New small left anterior chest wall mass 2.0 x 1.9 cm left parasternal region 9/23 venous duplex negative BLE 9/24 placed on BiPAP 08/28/23 intubated -palliative care discussion with family, allergy given poor prognosis since this was noted to be triple negative cancer.  She was extubated to comfort care and passed away soon after   Procedures: Endotracheal intubation  Consultations: Oncology Palliative care    Signed: Comer Locket. Vassie Loll, MD 08-28-2023

## 2023-08-26 NOTE — Plan of Care (Signed)
Problem: Clinical Measurements: Goal: Ability to maintain clinical measurements within normal limits will improve 08/12/2023 1341 by Micki Riley, RN Outcome: Not Progressing   Problem: Clinical Measurements: Goal: Will remain free from infection 07/27/2023 1341 by Micki Riley, RN Outcome: Not Progressing   Problem: Clinical Measurements: Goal: Diagnostic test results will improve 08/18/2023 1341 by Micki Riley, RN Outcome: Not Progressing   Problem: Clinical Measurements: Goal: Respiratory complications will improve 08/09/2023 1341 by Micki Riley, RN Outcome: Not Progressing   Problem: Clinical Measurements: Goal: Cardiovascular complication will be avoided 08/15/2023 1341 by Micki Riley, RN Outcome: Not Progressing   Problem: Activity: Goal: Risk for activity intolerance will decrease 08/19/2023 1341 by Micki Riley, RN Outcome: Not Progressing   Problem: Nutrition: Goal: Adequate nutrition will be maintained 08/10/2023 1341 by Micki Riley, RN Outcome: Not Progressing 07/28/2023 1340 by Micki Riley, RN Outcome: Not Progressing   Problem: Coping: Goal: Level of anxiety will decrease 08/17/2023 1341 by Micki Riley, RN Outcome: Not Progressing   Problem: Pain Managment: Goal: General experience of comfort will improve 08/19/2023 1341 by Micki Riley, RN Outcome: Not Progressing   Problem: Activity: Goal: Ability to tolerate increased activity will improve 08/14/2023 1341 by Micki Riley, RN Outcome: Not Progressing   Problem: Respiratory: Goal: Ability to maintain a clear airway and adequate ventilation will improve 07/28/2023 1341 by Micki Riley, RN Outcome: Not Progressing   Problem: Fluid Volume: Goal: Ability to maintain a balanced intake and output will improve 08/22/2023 1341 by Micki Riley, RN Outcome: Not Progressing   Problem: Nutritional: Goal: Maintenance of adequate nutrition will improve 07/28/2023 1341 by Micki Riley, RN Outcome: Not Progressing   Problem: Nutritional: Goal: Progress toward achieving an optimal weight will improve 08/22/2023 1341 by Micki Riley, RN Outcome: Not Progressing   Problem: Role Relationship: Goal: Ability to verbalize concerns, feelings, and thoughts to partner or family member will improve 08/12/2023 1341 by Micki Riley, RN Outcome: Progressing   Problem: Coping: Goal: Ability to identify and develop effective coping behavior will improve 08/23/2023 1341 by Micki Riley, RN Outcome: Progressing

## 2023-08-26 NOTE — Progress Notes (Signed)
Ok to tx with plt = 97k per Dr Al Pimple

## 2023-08-26 NOTE — Progress Notes (Signed)
Dr. Vassie Loll talked to pt about intubation. Pt gave verbal consent. Pt intubated @ 0900. See MAR for RSI meds. OG placed. BP dorpped. Per MD, start Levo.

## 2023-08-26 NOTE — Procedures (Signed)
Intubation Procedure Note  Sherri Schultz  [id number]  August 22, 1966  Date:08/21/2023  Time:9:47 AM   Provider Performing:Sherri Schultz  Sherri Schultz    Procedure: Intubation (31500)  Indication(s) Respiratory Failure  Consent Risks of the procedure as well as the alternatives and risks of each were explained to the patient and/or caregiver.  Consent for the procedure was obtained and is signed in the bedside chart   Anesthesia Etomidate, Versed, Fentanyl, and Rocuronium   Time Out Verified patient identification, verified procedure, site/side was marked, verified correct patient position, special equipment/implants available, medications/allergies/relevant history reviewed, required imaging and test results available.   Sterile Technique Usual hand hygeine, masks, and gloves were used   Procedure Description Patient positioned in bed supine.  Sedation given as noted above.  Patient was intubated with endotracheal tube using Glidescope.  View was Grade 1 full glottis .  Number of attempts was 1.  Colorimetric CO2 detector was consistent with tracheal placement.   Complications/Tolerance None; patient tolerated the procedure well. Chest X-ray is ordered to verify placement.   EBL Minimal   Specimen(s) None

## 2023-08-26 NOTE — Progress Notes (Signed)
OT Cancellation Note  Patient Details Name: Sherri Schultz MRN: [id number] DOB: 1965-12-18   Cancelled Treatment:    Reason Eval/Treat Not Completed: Medical issues which prohibited therapy Patient with increased RR, on BiPAP, and recently provided morphine. OT to continue to follow and check back as schedule will allow.  Rosalio Loud, MS Acute Rehabilitation Department Office# [phone]  08/03/2023, 8:20 AM

## 2023-08-26 NOTE — Final Progress Note (Signed)
Waste and dispose at [employer] with Patent attorney- 10ml Fentanyl premix IV and 80ml Midazolam Versed premix IV.

## 2023-08-26 NOTE — Progress Notes (Signed)
Sherri Schultz   DOB:1966-07-24   ZO#:[id number]   JWJ#:[id number]  Subjective:  She was intubated today around 9 AM because of increasing of increased oxygen needs. She has been on bipap for 24 hrs with high FiO2.   Objective:  Vitals:   08/12/2023 1100 08/02/2023 1115  BP: (!) 152/119 112/86  Pulse: (!) 110 (!) 113  Resp: (!) 24 20  Temp:    SpO2: 94% 93%    Body mass index is 26.9 kg/m.  Intake/Output Summary (Last 24 hours) at 08/01/2023 1140 Last data filed at 08/05/2023 0800 Gross per 24 hour  Intake 50 ml  Output 525 ml  Net -475 ml     Intubated and sedated.              CBG (last 3)  No results for input(s): "GLUCAP" in the last 72 hours.   Labs:  Lab Results  Component Value Date   WBC 15.2 (H) 08/15/2023   HGB 13.1 08/17/2023   HCT 41.1 08/16/2023   MCV 91.5 08/14/2023   PLT 97 (L) 08/21/2023   NEUTROABS 10.4 (H) 08/05/2023    @LASTCHEMISTRY @  Urine Studies No results for input(s): "UHGB", "CRYS" in the last 72 hours.  Invalid input(s): "UACOL", "UAPR", "USPG", "UPH", "UTP", "UGL", "UKET", "UBIL", "UNIT", "UROB", "ULEU", "UEPI", "UWBC", "URBC", "UBAC", "CAST", "UCOM", "BILUA"  Basic Metabolic Panel: Recent Labs  Lab 08/17/23 0043 08/18/23 0314 08/19/23 0309 08/12/2023 0254 08/10/2023 1006  NA 133* 134* 134* 134* 135  K 3.8 3.4* 3.4* 3.5 3.1*  CL 84* 86* 87* 87* 92*  CO2 32 32 28 28 26   GLUCOSE 112* 122* 105* 109* 121*  BUN 27* 33* 36* 39* 42*  CREATININE 0.80 0.91 0.81 0.69 0.66  CALCIUM 9.2 9.3 9.0 9.0 8.4*   GFR Estimated Creatinine Clearance: 77.4 mL/min (by C-G formula based on SCr of 0.66 mg/dL). Liver Function Tests: Recent Labs  Lab 07/30/2023 1006  AST 56*  ALT 17  ALKPHOS 150*  BILITOT 1.5*  PROT 6.2*  ALBUMIN 2.3*   No results for input(s): "LIPASE", "AMYLASE" in the last 168 hours. No results for input(s): "AMMONIA" in the last 168 hours. Coagulation profile Recent Labs  Lab 08/18/23 0314  INR 1.2     CBC: Recent Labs  Lab 08/16/23 0313 08/17/23 0043 08/18/23 0314 08/19/23 0309 07/27/2023 0254  WBC 14.3* 16.9* 15.8* 15.2* 15.2*  HGB 12.5 12.6 13.0 12.6 13.1  HCT 39.3 40.1 41.4 39.5 41.1  MCV 94.9 92.6 94.5 93.8 91.5  PLT 291 233 192 145* 97*   Cardiac Enzymes: No results for input(s): "CKTOTAL", "CKMB", "CKMBINDEX", "TROPONINI" in the last 168 hours. BNP: Invalid input(s): "POCBNP" CBG: No results for input(s): "GLUCAP" in the last 168 hours. D-Dimer No results for input(s): "DDIMER" in the last 72 hours. Hgb A1c No results for input(s): "HGBA1C" in the last 72 hours. Lipid Profile No results for input(s): "CHOL", "HDL", "LDLCALC", "TRIG", "CHOLHDL", "LDLDIRECT" in the last 72 hours. Thyroid function studies No results for input(s): "TSH", "T4TOTAL", "T3FREE", "THYROIDAB" in the last 72 hours.  Invalid input(s): "FREET3" Anemia work up No results for input(s): "VITAMINB12", "FOLATE", "FERRITIN", "TIBC", "IRON", "RETICCTPCT" in the last 72 hours. Microbiology No results found for this or any previous visit (from the past 240 hour(s)).  Reviewed pathology report.   FINAL MICROSCOPIC DIAGNOSIS:  A. LYMPH NODE, STATION 4R, FINE NEEDLE ASPIRATION:  - Malignant cells present   B. LYMPH NODE, STATION 7, FINE NEEDLE ASPIRATION:  - Malignant cells  present. See Comment.   Additional immunohistochemical stains reveal tumor cells are positive  for CK7, focal PAX8, and focal GATA3 and negative for CDX2 and CK20,  indicating possible breast/mullerian origin.  Given the patient's  history of breast carcinoma, a breast origin is more likely.  Clinical  and imaging correlation is recommended for further assessment.   ER neg, PR neg, Her 2 2+ by IHC, FISH pending  Radiology CT angio  IMPRESSION: 1. No evidence of significant pulmonary embolus. 2. Persistent finding of mediastinal lymphadenopathy, unchanged. 3. Large left and small right pleural effusions are  progressing since prior CT. 4. Diffuse interstitial and alveolar infiltrates throughout both lungs is also progressing. Changes may represent edema or multifocal pneumonia. Aspiration or underlying neoplasm could also be considered.   CT abdomen/pelvis IMPRESSION: 1. No acute intra-abdominal or intrapelvic abnormality. No intra-abdominal or intrapelvic metastatic findings. 2. Irregular interstitial and patchy airspace opacities of visualized lower lungs. Persistent moderate volume partially visualized left pleural effusion with question associate pleural thickening. Trace right pleural effusion. Findings better evaluated on CT angiography chest 07/29/2023. 3. Persistent indeterminate T9 mixed lytic and sclerotic osseous.  Assessment: 57 y.o.  ASSESSMENT: 57 y.o. 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  The Multi-Cancer Panel offered by [facility] includes also found no deleterious mutations inAIP, ALK, APC, ATM, AXIN2,BAP1,  BARD1, BLM, BMPR1A, BRCA1, BRCA2, BRIP1, CASR, CDC73, CDH1, CDK4, CDKN1B, CDKN1C, CDKN2A (p14ARF), CDKN2A (p16INK4a), CEBPA, CHEK2, CTNNA1, DICER1, DIS3L2, EGFR (c.2369C>T, p.Thr790Met variant only), EPCAM (Deletion/duplication testing only), FH, FLCN, GATA2, GPC3, GREM1 (Promoter region deletion/duplication testing only), HOXB13 (c.251G>A, p.Gly84Glu), HRAS, KIT, MAX, MEN1, MET, MITF (c.952G>A, p.Glu318Lys variant only), MLH1, MSH2, MSH3, MSH6, MUTYH, NBN, NF1, NF2, NTHL1, PALB2, PDGFRA, PHOX2B, PMS2, POLD1, POLE, POT1, PRKAR1A, PTCH1, PTEN, RAD50, RAD51C, RAD51D, RB1, RECQL4, RET, RNF43, RUNX1, SDHAF2, SDHA (sequence changes only), SDHB, SDHC, SDHD, SMAD4, SMARCA4,  SMARCB1, SMARCE1, STK11, SUFU, TERC, TERT, TMEM127, TP53, TSC1, TSC2, VHL, WRN and WT1.                (a) a variant of  uncertain significance was detected in the MSH3 gene called c.230_232dup (p.Pro77dup).    (2) status post bilateral lumpectomies 02/15/2020, showing             (a) on the right, no evidence of malignancy (radial scar, fibroadenoma).             (b) on the left, ductal carcinoma in situ, grade 3, measuring 1.4 cm, with negative margins   (3) adjuvant radiation 03/20/2020 - 05/04/2020             (a) left breast / 50.4 Gy in 28 fractions             (b) seroma boost / 10 Gy in 5 fractions   (4) started tamoxifen 05/29/2020  (5) She is now diagnosed with metastatic disease to left pleural effusion, pleural thickening, wide spread bone mets, s/p biopsy to the 4 R LN and lymph node station 7, final path showing malignant cells consistent with breast primary, ER, PR neg, Her 2 2+, FISH neg  PLAN  She is now intubated and sedated. Pt wishes to try chemotherapy. We had a clear discussion about this on few  occasions.  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 Palliative care is on board as well, will discuss with family about expectations. Unfortunately she may not improve for a while even if chemo were to act. It is reasonable to discuss goals of care and consider palliative/comfort measures if she continues to deteriorate in the next several days.   Rachel Moulds, MD 09-17-23  11:40 AM

## 2023-08-26 NOTE — Procedures (Signed)
Extubation Procedure Note  Patient Details:   Name: Sherri Schultz DOB: 25-Apr-1966 MRN: [id number]   Airway Documentation:  Airway 7.5 mm (Active)  Secured at (cm) 23 cm 08/23/2023 1528  Measured From Lips 08/04/2023 1528  Secured Location Center 07/31/2023 4098  Secured By [employer] 08/23/2023 1528  Tube Holder Repositioned Yes 08/04/2023 1528  Prone position No 08/22/2023 1528   Vent end date: (not recorded) Vent end time: (not recorded)   Evaluation  O2 sats: stable throughout Complications: No apparent complications Patient did tolerate procedure well. Bilateral Breath Sounds: Fine crackles, Expiratory wheezes, Diminished   No Per verbal order from CCM, pt was extubated and placed on nasal cannula.  Revonda Humphrey 08/03/2023, 5:04 PM

## 2023-08-26 NOTE — Progress Notes (Signed)
NAME:  Sherri Schultz, MRN:  [id number], DOB:  1966-01-15, LOS: 12 ADMISSION DATE:  08/21/2023, CONSULTATION DATE:  09/16/23 REFERRING MD:  Caleb Popp - TRH, CHIEF COMPLAINT:  DOE   History of Present Illness:  57 year old woman with history malignant pleural effusion, chronic hypoxemia on 3 L nasal cannula at home presents with worsening dyspnea and hypoxemia. 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  Mediastinal lymphadenopathy status post EBUS 4R and station 7 showing malignant cells consistent with breast primary, ER/PR negative, HER2 pending  Contacted pulmonary office.  Has been trying to set up outpatient Pleurx.  Worsening symptoms but unable to get Pleurx done in short notice.  Advised to go to ED.  Chest x-ray showed loculated left pleural effusion o.  As well as scattered multifocal infiltrates consistent with parenchymal lung disease.Marland Kitchen  Underwent thoracentesis with IR 9/13 with removal of 700 cc on left, improvement in hypoxemia.  From nonrebreather down to 4 L at time of evaluation.    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   Cancer (HCC) 2013   right knee   Dyspnea    Family history of brain cancer    Family history of breast cancer    Family history of colon cancer    Family history of melanoma    GERD (gastroesophageal reflux disease)    Headache    Hypertension    Personal history of radiation therapy    Sleep apnea    Trimalleolar fracture of ankle, closed, left, initial encounter    Significant Hospital Events: Including procedures, antibiotic start and stop dates in addition to other pertinent events   9/13 Admitted to the hospital with worsening dyspnea on exertion and hypoxemia with recurrent left pleural  effusion, improvement in symptoms and hypoxemia after thoracentesis 9/16 Stable O2 requirements, 10L [place]. 9/17 Radiation today, tolerated well. O2 requirements stable. 9/21 CT chest > moderate multiloculated left pleural effusion with some decrease in size compared with priors.  Heterogeneous bilateral airspace disease, unchanged.  New small left anterior chest wall mass 2.0 x 1.9 cm left parasternal region 9/23 venous duplex negative BLE 9/24 placed on BiPAP  Interim History / Subjective:   Critically ill. Required BiPAP overnight, on 90% Severe anxiety persists in spite of intermittent morphine Tachycardic   Objective:   Blood pressure 119/77, pulse (!) 114, temperature 99 F (37.2 C), temperature source Axillary, resp. rate (!) 30, height 5' 4.5" (1.638 m), weight 72.2 kg, last menstrual period 03/27/2011, SpO2 97%.    Vent Mode: PRVC FiO2 (%):  [90 %-100 %] 100 % Set Rate:  [20 bmp] 20 bmp Vt Set:  [430 mL] 430 mL Plateau Pressure:  [30 cmH20] 30 cmH20   Intake/Output Summary (Last 24 hours) at September 16, 2023 0941 Last data filed at Sep 16, 2023 0800 Gross per 24 hour  Intake 50 ml  Output 525 ml  Net -475 ml   Filed Weights   08/09/2023 1245 08/09/23 0340 08/10/23 1930  Weight: 78.4 kg 73.1 kg 72.2 kg   Physical Examination: General: Chronically ill, in bed, on BiPAP anxious affect Neuro: Awake, interactive, nonfocal HEENT: Oropharynx clear, no secretions, strong voice, no stridor Cardiovascular: S1-S2 tacky, regular Lungs: Mild excess worry muscle use, decreased breath sounds on left Abdomen:  Nondistended with positive bowel sounds Musculoskeletal: Left more than right swelling Skin: No rash  CT chest with contrast 9/21 shows stable mediastinal and bilateral hilar lymphadenopathy, diffuse bilateral interstitial infiltrates and new 2 cm left parasternal chest wall mass with decrease in size of multiloculated left pleural effusion  Chest x-ray 09-06-23 unchanged bilateral  infiltrates, small left effusion  Labs show mild hyponatremia, hypokalemia, persistent leukocytosis, worsening thrombocytopenia   Assessment & Plan:  Acute on chronic hypoxemic respiratory failure Severe hypoxia likely related to lymphangitic spread of cancer  -Do not expect this process to improve for a few days.  She has been on BiPAP 24 hours with high FiO2 requirements, we will proceed with mechanical ventilation Discussed with patient and her husband and they are willing to proceed -PAD protocol with fentanyl drip and intermittent Versed, goal RASS -2 to -3  Recurrent left-sided effusion, malignant, breast/mllerian.  S/p IR thoracentesis 9/13 with of amber fluid removed. -Plan is for Pleurx catheter once hypoxia improves/or fluid recollects, not enough fluid on ultrasound 9/23    Metastatic breast cancer ER/PR negative, HER2 neg  -She has completed XRT to her spinal mets -Plan is for chemotherapy today   Severe anxiety -Use oxycodone 5 mg every 4 for pain and added Xanax for anxiety, this should also help alleviate work of breathing. -DC Inderal and Atarax, continue Effexor Hypokalemia will be repleted  Diet/type: NPO DVT prophylaxis: SQH GI prophylaxis: H2B Lines: PIV Foley:  No Code Status:  full code Last date of multidisciplinary goals of care discussion 09-06-23 patient desires full medical care including mechanical ventilation and CPR.  She is still in shock from the diagnosis of metastatic cancer and would like to give chemotherapy a full chance to work.  She designates her husband as HCPOA  My independent critical care time was 45 minutes  Cyril Mourning MD. FCCP. [facility] Pager : [phone]  If no response to pager , please call [phone] until 7 pm After 7:00 pm call Elink  [phone]    09-06-2023, 9:41 AM

## 2023-08-26 NOTE — Progress Notes (Signed)
PT Cancellation Note  Patient Details Name: Maven Vencill Luiz MRN: [id number] DOB: 1966-10-13   Cancelled Treatment:    Reason Eval/Treat Not Completed: Medical issues which prohibited therapy Patient with increased RR, on BiPAP at this time. Defer PT, continue to follow   Delice Bison, PT  Acute Rehab Dept [facility]) [phone]  08/25/2023   [facility] 08/06/2023, 9:15 AM

## 2023-08-26 NOTE — Progress Notes (Signed)
08/16/2023 1615  Spiritual Encounters  Type of Visit Declined chaplain visit  Referral source Physician  Reason for visit End-of-life  OnCall Visit No  Intervention Outcomes  Outcomes Declined chaplain visit   Chaplain introduced herself to patient's family. Family stated they were "ok" and declined a chaplain visit at this time. Chaplain informed the family of 24/7 chaplain support and to make staff aware if they have any needs.   Arlyce Dice, Chaplain Resident

## 2023-08-26 NOTE — Progress Notes (Signed)
Chemo RN arrived to patient room for chemotherapy treatment. Family at bedside discussing patient's care with Dr Patterson Hammersmith. Per Dr Patterson Hammersmith, no chemotherapy to be given. Dr Al Pimple made aware of family's decision.

## 2023-08-26 DEATH — deceased

## 2023-09-03 ENCOUNTER — Ambulatory Visit: Payer: BC Managed Care – PPO | Admitting: Neurology

## 2023-09-04 LAB — ACID FAST CULTURE WITH REFLEXED SENSITIVITIES (MYCOBACTERIA): Acid Fast Culture: NEGATIVE

## 2023-09-08 ENCOUNTER — Ambulatory Visit: Payer: BC Managed Care – PPO | Admitting: Cardiovascular Disease

## 2023-09-25 ENCOUNTER — Ambulatory Visit: Payer: BC Managed Care – PPO | Admitting: Emergency Medicine

## 2024-04-13 ENCOUNTER — Ambulatory Visit: Payer: BC Managed Care – PPO | Admitting: Hematology and Oncology
